# Patient Record
Sex: Female | Born: 1970
Health system: Southern US, Community
[De-identification: ages and names within clinical notes are randomized; demographics above are authoritative.]

## PROBLEM LIST (undated history)

## (undated) ENCOUNTER — Ambulatory Visit: Admission: EM | Source: Home / Self Care

## (undated) DIAGNOSIS — IMO0002 Reserved for concepts with insufficient information to code with codable children: Secondary | ICD-10-CM

## (undated) DIAGNOSIS — N920 Excessive and frequent menstruation with regular cycle: Secondary | ICD-10-CM

## (undated) DIAGNOSIS — D219 Benign neoplasm of connective and other soft tissue, unspecified: Secondary | ICD-10-CM

## (undated) DIAGNOSIS — F419 Anxiety disorder, unspecified: Secondary | ICD-10-CM

## (undated) DIAGNOSIS — N83299 Other ovarian cyst, unspecified side: Secondary | ICD-10-CM

## (undated) DIAGNOSIS — N946 Dysmenorrhea, unspecified: Secondary | ICD-10-CM

## (undated) DIAGNOSIS — M1990 Unspecified osteoarthritis, unspecified site: Secondary | ICD-10-CM

## (undated) DIAGNOSIS — F909 Attention-deficit hyperactivity disorder, unspecified type: Secondary | ICD-10-CM

## (undated) DIAGNOSIS — J45909 Unspecified asthma, uncomplicated: Secondary | ICD-10-CM

## (undated) DIAGNOSIS — R102 Pelvic and perineal pain: Secondary | ICD-10-CM

## (undated) DIAGNOSIS — G35 Multiple sclerosis: Secondary | ICD-10-CM

## (undated) DIAGNOSIS — G709 Myoneural disorder, unspecified: Secondary | ICD-10-CM

## (undated) DIAGNOSIS — T7840XA Allergy, unspecified, initial encounter: Secondary | ICD-10-CM

## (undated) DIAGNOSIS — G8929 Other chronic pain: Secondary | ICD-10-CM

## (undated) DIAGNOSIS — N809 Endometriosis, unspecified: Secondary | ICD-10-CM

## (undated) DIAGNOSIS — K219 Gastro-esophageal reflux disease without esophagitis: Secondary | ICD-10-CM

## (undated) DIAGNOSIS — N816 Rectocele: Secondary | ICD-10-CM

## (undated) DIAGNOSIS — F32A Depression, unspecified: Secondary | ICD-10-CM

## (undated) DIAGNOSIS — F329 Major depressive disorder, single episode, unspecified: Secondary | ICD-10-CM

## (undated) HISTORY — DX: Major depressive disorder, single episode, unspecified: F32.9

## (undated) HISTORY — DX: Benign neoplasm of connective and other soft tissue, unspecified: D21.9

## (undated) HISTORY — DX: Other chronic pain: G89.29

## (undated) HISTORY — DX: Myoneural disorder, unspecified: G70.9

## (undated) HISTORY — DX: Multiple sclerosis: G35

## (undated) HISTORY — DX: Depression, unspecified: F32.A

## (undated) HISTORY — DX: Unspecified asthma, uncomplicated: J45.909

## (undated) HISTORY — DX: Endometriosis, unspecified: N80.9

## (undated) HISTORY — DX: Attention-deficit hyperactivity disorder, unspecified type: F90.9

## (undated) HISTORY — DX: Anxiety disorder, unspecified: F41.9

## (undated) HISTORY — PX: HERNIA REPAIR: SHX51

## (undated) HISTORY — DX: Unspecified osteoarthritis, unspecified site: M19.90

## (undated) HISTORY — PX: COLON SURGERY: SHX602

## (undated) HISTORY — DX: Pelvic and perineal pain: R10.2

## (undated) HISTORY — DX: Reserved for concepts with insufficient information to code with codable children: IMO0002

## (undated) HISTORY — DX: Other ovarian cyst, unspecified side: N83.299

## (undated) HISTORY — DX: Excessive and frequent menstruation with regular cycle: N92.0

## (undated) HISTORY — DX: Rectocele: N81.6

## (undated) HISTORY — DX: Dysmenorrhea, unspecified: N94.6

## (undated) HISTORY — DX: Allergy, unspecified, initial encounter: T78.40XA

## (undated) HISTORY — DX: Gastro-esophageal reflux disease without esophagitis: K21.9

---

## 2003-07-27 HISTORY — PX: BREAST BIOPSY: SHX20

## 2005-07-29 ENCOUNTER — Ambulatory Visit: Payer: Self-pay | Admitting: Orthopaedic Surgery

## 2006-08-31 ENCOUNTER — Ambulatory Visit: Payer: Self-pay | Admitting: General Surgery

## 2008-06-01 ENCOUNTER — Emergency Department: Payer: Self-pay | Admitting: Emergency Medicine

## 2008-06-13 ENCOUNTER — Ambulatory Visit: Payer: Self-pay | Admitting: Neurology

## 2008-06-18 ENCOUNTER — Ambulatory Visit: Payer: Self-pay | Admitting: Neurology

## 2009-05-29 ENCOUNTER — Ambulatory Visit: Payer: Self-pay

## 2010-06-08 ENCOUNTER — Ambulatory Visit: Payer: Self-pay

## 2010-09-04 ENCOUNTER — Emergency Department: Payer: Self-pay | Admitting: Emergency Medicine

## 2010-11-20 ENCOUNTER — Ambulatory Visit: Payer: Self-pay | Admitting: Family Medicine

## 2011-03-08 ENCOUNTER — Ambulatory Visit: Payer: Self-pay | Admitting: Family Medicine

## 2011-12-21 ENCOUNTER — Ambulatory Visit: Payer: Self-pay | Admitting: Family Medicine

## 2011-12-23 ENCOUNTER — Ambulatory Visit: Payer: Self-pay | Admitting: Family Medicine

## 2012-03-31 ENCOUNTER — Ambulatory Visit: Payer: Self-pay | Admitting: Family Medicine

## 2013-03-29 ENCOUNTER — Ambulatory Visit: Payer: Self-pay | Admitting: Family Medicine

## 2014-09-26 ENCOUNTER — Ambulatory Visit: Payer: Self-pay | Admitting: Obstetrics and Gynecology

## 2014-09-30 ENCOUNTER — Ambulatory Visit: Payer: Self-pay | Admitting: Obstetrics and Gynecology

## 2014-09-30 HISTORY — PX: OVARY SURGERY: SHX727

## 2014-11-18 LAB — SURGICAL PATHOLOGY

## 2014-11-24 NOTE — Op Note (Signed)
PATIENT NAME:  Robin Chan, FRISCIA MR#:  [id number] DATE OF BIRTH:  [birth year]  DATE OF PROCEDURE:  09/30/2014  PREOPERATIVE DIAGNOSIS: Worsening chronic pelvic pain (midline and left lower quadrant).   POSTOPERATIVE DIAGNOSES: 1.  Worsening chronic pelvic pain (midline and left lower quadrant).  2.  Endometriosis.  3.  Right ovarian cyst.  4.  Pelvic congestion syndrome.   SURGICAL PROCEDURES:  1.  Laparoscopic excision and fulguration of endometriosis.  2.  Left salpingo-oophorectomy. 3.  Right ovarian cystectomy.   SURGEON: Brayton Mars, MD   FIRST ASSISTANT: None.   ANESTHESIA: General endotracheal.   INDICATIONS: The patient is a 44 year old, married African American female para 2-0-0-2, with long history of severe dysmenorrhea and chronic pelvic pain noted to be in the midline and left lower quadrant and associated with deep thrusting dyspareunia, presents for surgical evaluation.   FINDINGS AT SURGERY: Revealed a gynecoid pelvis. The uterus was of normal size and shape and mobile. The patient is a transvaginal hysterectomy or total laparoscopic hysterectomy candidate if necessary in the future.   On laparoscopy, the patient was noted to have powder burn implants of endometriosis along the left ovarian fossa. These were excised and cauterized. The left tube and ovary appeared grossly normal. Right ovary contained a 2 cm cyst which was excised. There were multiple varicosities and tortuous uterine arteries noted in the lower uterine segment consistent with pelvic congestion syndrome.   DESCRIPTION OF THE PROCEDURE: The patient was brought to the operating room where she was placed in the supine position. General endotracheal anesthesia was induced without difficulty. She was placed in the dorsal lithotomy position using the bumblebee stirrups. A ChloraPrep and Betadine abdominal, perineal, intravaginal prep and drape was performed in standard fashion. A red Robinson catheter was  used to drain 50 mL of urine from the bladder. Hulka tenaculum was placed on the cervix to facilitate uterine manipulation. Subumbilical vertical incision 5 mm in length was made. The Optiview laparoscopic trocar system was used to place a 5 mm port directly into the abdominopelvic cavity without evidence of bowel or vascular injury. A second 5 mm port was placed in the suprapubic region under direct visualization. An 11 mm trocar was placed in the left lower quadrant under direct visualization. The above-noted findings were photo-documented. The left ovarian fossa endometriosis implants were excised using biopsy forceps. This was followed by Kleppinger bipolar forceps cautery. Care was made to avoid the ureter, which was noted to have appropriate peristalsis below the operative field.   Because of her symptom complex being confined to the left lower quadrant, decision was made to remove the left adnexa. The adnexa was grasped with a grasper and the Ace Harmonic scalpel was used to clamp, coagulate, and cut the infundibulopelvic ligament. The mesosalpinx was likewise sequentially clamped, coagulated, and cut to the level of the uterine cornua, which was then crossclamped, cut, and removed through the 11 mm port. The tube and ovary were removed without difficulty. Good hemostasis was noted. Kleppinger bipolar forceps was used to cauterize the pedicle line to further assure hemostasis. The right ovarian cyst was notable and drilling was performed with the Ace Harmonic scalpel. This opened up the cyst which released serosanguineous fluid. The cyst contents were extracted through a stripping technique. This was followed by cautery of margins of the ovarian cyst wall, which were noted to be bleeding slightly. Once hemostasis was attained, the pelvis was copiously irrigated and irrigant fluid was aspirated. Upon completion of the survey  of the pelvis and abdomen, the procedure was then terminated with all  instrumentation being removed from the abdominopelvic cavity. Pneumoperitoneum was released. The incisions were closed with 0 Vicryl on the fascia in the left lower quadrant port site. Skin was closed with subcuticular stitches of 4-0 Vicryl. Dermabond glue was placed over the incisions. The patient was then awakened, extubated and taken to the recovery room in satisfactory condition.   ESTIMATED BLOOD LOSS: 50 mL.   URINE OUTPUT: 50 mL.   IV FLUIDS: 1200 mL.  COUNTS: All instruments, needle, and sponge counts were verified as correct.    ____________________________ Alanda Slim. Alane Hanssen, MD mad:LT D: 09/30/2014 93:90:30 ET T: 09/30/2014 20:51:41 ET JOB#: [id number]  cc: Hassell Done A. Hartley Urton, MD, <Dictator> [facility] MD ELECTRONICALLY SIGNED 10/07/2014 8:10

## 2014-11-24 NOTE — H&P (Signed)
PATIENT NAME:  Robin Chan, BIGGERS MR#:  [id number] DATE OF BIRTH:  [birth year]  DATE OF ADMISSION:  09/30/2014   PREOPERATIVE DIAGNOSIS:  Chronic pelvic pain (midline and left lower quadrant).   HISTORY OF PRESENT ILLNESS:  The patient is a 44 year old married African American female, para 2, 0-0-2 with a long history of severe dysmenorrhea and chronic pelvic pain noted to be in the midline and left lower quadrant along with significant deep thrusting dyspareunia, presents for evaluation of etiology of pelvic pain. The patient's history and physical were suspicious for endometriosis. Because the symptoms have been progressively worsening over the past several months, she desires to proceed with surgical evaluation.   PAST MEDICAL HISTORY:   1.  Multiple sclerosis.  2.  Menorrhagia.  3.  Deep thrusting dyspareunia.  4.  Severe dysmenorrhea.  5.  ADHD.  6.  Anxiety.  7.  GERD.   8.  Rectocele.    PAST SURGICAL HISTORY:  1.  Tooth extraction.  2.  Tonsillectomy.   PAST OBSTETRIC HISTORY:  Para 2 - 0-0-2; SVD x 2, largest infant, 9 pounds, 4 ounces.   FAMILY HISTORY: Colon cancer maternal cousins, ovarian cancer paternal grandmother, breast cancer mother and great aunt.   SOCIAL HISTORY: The patient is a homemaker. She has never smoked. She uses alcohol occasionally. Does not use drugs.   DRUG ALLERGIES: PENICILLIN WHICH CAUSES ANAPHYLAXIS.   CURRENT MEDICATIONS: Ritalin, meloxicam, Zyrtec, Ambien-CR, Flonase, Xyzal, Lexapro, Ventolin HFA, EpiPen, vitamin D, multivitamins, fish oil, glucosamine-chondroitin sulfate, probiotics.   REVIEW OF SYSTEMS: The patient denies recent illness. She does not have reactive airway disease. She has not had antibiotic use recently. No history of coagulopathy.   PHYSICAL EXAMINATION:  VITAL SIGNS: Weight 202 pounds, height 69.6 inches, BMI 29.4, heart rate 106, blood pressure 129/82.  GENERAL: The patient is a pleasant female in no acute distress. She is  alert and oriented. Affect is appropriate.  HEENT: Oropharynx is clear.  NECK: Supple without thyroid or adenopathy.  LUNGS: Clear.  HEART: Regular rate and rhythm without murmur, S3 or S4.  ABDOMEN: Soft and nontender. No organomegaly.  PELVIC: External genitalia normal. BUS normal. Vagina with good estrogen effect.  Cervix with mild cervical motion tenderness 1/4.  Uterus is anteverted, mobile and tender, 4/4.  Right adnexa nonpalpable, nontender. Left adnexa nonpalpable but 2/4 tender.  RECTAL: External rectal exam is normal.  EXTREMITIES: Warm and dry.   IMPRESSION:  1.  Worsening pelvic pain, midline and right lower quadrant.  2.  Severe dysmenorrhea.  3.  Deep thrusting dyspareunia.  4.  Suspect endometriosis.   PLAN: Laparoscopy with peritoneal biopsies, possible excision of left adnexa.    PREOPERATIVE CONSENT: The patient is to undergo laparoscopy with peritoneal biopsies to assess for etiology of pelvic pain. The patient has history and physical suspicious of endometriosis. The patient will have laparoscopy with biopsies only if endometriosis is clearly identified as the patient would then be candidate for complete hysterectomy. At a later date.  I do not anticipate removing adnexal structures today.  If there is significant disease in the left adnexa, we may consider laparoscopic LSO.  The patient is accepting of all risks which include, but are not limited to, bleeding, infection, pelvic organ injury with need for repair, blood clot disorders, anesthesia risks, etc.  All questions have been answered.  Informed consent is given. The patient is ready and willing to proceed with surgery as scheduled.      ____________________________ Alanda Slim Keiden Deskin,  MD mad:DT D: 09/30/2014 10:56:21 ET T: 09/30/2014 11:55:40 ET JOB#: [id number]  cc: Hassell Done A. Aoi Kouns, MD, <Dictator> [facility] MD ELECTRONICALLY SIGNED 09/30/2014 15:54

## 2014-12-31 ENCOUNTER — Ambulatory Visit (INDEPENDENT_AMBULATORY_CARE_PROVIDER_SITE_OTHER): Payer: 59 | Admitting: Family Medicine

## 2014-12-31 ENCOUNTER — Encounter: Payer: Self-pay | Admitting: Family Medicine

## 2014-12-31 ENCOUNTER — Encounter (INDEPENDENT_AMBULATORY_CARE_PROVIDER_SITE_OTHER): Payer: Self-pay

## 2014-12-31 VITALS — BP 122/72 | HR 95 | Temp 97.6°F | Resp 16 | Ht 69.5 in | Wt 198.3 lb

## 2014-12-31 DIAGNOSIS — R9389 Abnormal findings on diagnostic imaging of other specified body structures: Secondary | ICD-10-CM | POA: Insufficient documentation

## 2014-12-31 DIAGNOSIS — J3089 Other allergic rhinitis: Secondary | ICD-10-CM | POA: Diagnosis not present

## 2014-12-31 DIAGNOSIS — E559 Vitamin D deficiency, unspecified: Secondary | ICD-10-CM | POA: Insufficient documentation

## 2014-12-31 DIAGNOSIS — J4520 Mild intermittent asthma, uncomplicated: Secondary | ICD-10-CM | POA: Insufficient documentation

## 2014-12-31 DIAGNOSIS — K5900 Constipation, unspecified: Secondary | ICD-10-CM | POA: Insufficient documentation

## 2014-12-31 DIAGNOSIS — F330 Major depressive disorder, recurrent, mild: Secondary | ICD-10-CM | POA: Insufficient documentation

## 2014-12-31 DIAGNOSIS — G35 Multiple sclerosis: Secondary | ICD-10-CM

## 2014-12-31 DIAGNOSIS — G43109 Migraine with aura, not intractable, without status migrainosus: Secondary | ICD-10-CM | POA: Insufficient documentation

## 2014-12-31 DIAGNOSIS — F329 Major depressive disorder, single episode, unspecified: Secondary | ICD-10-CM

## 2014-12-31 DIAGNOSIS — Z91030 Bee allergy status: Secondary | ICD-10-CM | POA: Insufficient documentation

## 2014-12-31 DIAGNOSIS — G4700 Insomnia, unspecified: Secondary | ICD-10-CM | POA: Diagnosis not present

## 2014-12-31 DIAGNOSIS — F32A Depression, unspecified: Secondary | ICD-10-CM

## 2014-12-31 DIAGNOSIS — Z8781 Personal history of (healed) traumatic fracture: Secondary | ICD-10-CM | POA: Insufficient documentation

## 2014-12-31 DIAGNOSIS — F909 Attention-deficit hyperactivity disorder, unspecified type: Secondary | ICD-10-CM | POA: Diagnosis not present

## 2014-12-31 DIAGNOSIS — F988 Other specified behavioral and emotional disorders with onset usually occurring in childhood and adolescence: Secondary | ICD-10-CM

## 2014-12-31 DIAGNOSIS — D352 Benign neoplasm of pituitary gland: Secondary | ICD-10-CM | POA: Insufficient documentation

## 2014-12-31 DIAGNOSIS — M62830 Muscle spasm of back: Secondary | ICD-10-CM

## 2014-12-31 DIAGNOSIS — G4709 Other insomnia: Secondary | ICD-10-CM | POA: Insufficient documentation

## 2014-12-31 DIAGNOSIS — G35A Relapsing-remitting multiple sclerosis: Secondary | ICD-10-CM

## 2014-12-31 DIAGNOSIS — M6530 Trigger finger, unspecified finger: Secondary | ICD-10-CM | POA: Insufficient documentation

## 2014-12-31 DIAGNOSIS — J302 Other seasonal allergic rhinitis: Secondary | ICD-10-CM | POA: Insufficient documentation

## 2014-12-31 DIAGNOSIS — N809 Endometriosis, unspecified: Secondary | ICD-10-CM | POA: Insufficient documentation

## 2014-12-31 MED ORDER — AMPHETAMINE-DEXTROAMPHET ER 25 MG PO CP24: 25.0000 mg | ORAL_CAPSULE | ORAL | Status: DC

## 2014-12-31 MED ORDER — LEVOCETIRIZINE DIHYDROCHLORIDE 5 MG PO TABS: 5.0000 mg | ORAL_TABLET | Freq: Every evening | ORAL | Status: DC

## 2014-12-31 MED ORDER — CYCLOBENZAPRINE HCL 5 MG PO TABS: 5.0000 mg | ORAL_TABLET | Freq: Three times a day (TID) | ORAL | Status: DC | PRN

## 2014-12-31 MED ORDER — FLUTICASONE PROPIONATE 50 MCG/ACT NA SUSP: 2.0000 | Freq: Every day | NASAL | Status: DC

## 2014-12-31 MED ORDER — ZOLPIDEM TARTRATE ER 12.5 MG PO TBCR: 12.5000 mg | EXTENDED_RELEASE_TABLET | Freq: Every evening | ORAL | Status: DC | PRN

## 2014-12-31 MED ORDER — ESCITALOPRAM OXALATE 20 MG PO TABS: 20.0000 mg | ORAL_TABLET | Freq: Every day | ORAL | Status: DC

## 2014-12-31 NOTE — Progress Notes (Signed)
Name: Robin Chan   MRN: [id number]    DOB: 1970/11/24   Date:12/31/2014       Progress Note  Subjective  Chief Complaint  Chief Complaint  Patient presents with  . Medication Refill    3 month F/U  . Asthma    Improving  . Allergic Rhinitis     Improving  . Insomnia    6-8 hours nightly  . Multiple Sclerosis    unchanged  . Depression    worsen due to stress and son graduating high school about to leave for college  . ADHD    unchanged-lower dose of medication helps.    HPI    MS: patient was diagnosed with MS as a young adult, initially seen by neurologist and tried multiple medications but she could not tolerate the side effects. . She has been off medication for a long time and symptoms have been stable.  She has some leg  leg spasticity and uses a cane at times, also has some symptoms on right arm.    ADHD: she was formally diagnosed by her neurologist, has been on stimulants for many years. Tolerating medication and seems to control symptoms  Depression: she was struggling emotionally because she did not get the support at church, but she is coming around now, still taking medications.   Insomnia:  Taking Ambien, needs refills , tolerating medication well   Patient Active Problem List   Diagnosis Date Noted  . Abnormal CAT scan 12/31/2014  . ADD (attention deficit disorder) 12/31/2014  . Allergic to bees 12/31/2014  . Back muscle spasm 12/31/2014  . Insomnia, persistent 12/31/2014  . CN (constipation) 12/31/2014  . Clinical depression 12/31/2014  . Personal history of traumatic fracture 12/31/2014  . Asthma, mild intermittent 12/31/2014  . Migraine with aura and without status migrainosus, not intractable 12/31/2014  . Multiple sclerosis, relapsing-remitting 12/31/2014  . Adnexal pain 12/31/2014  . Allergic rhinitis 12/31/2014  . Pituitary microadenoma 12/31/2014  . Vitamin D deficiency 12/31/2014  . Acquired trigger finger 12/31/2014    Past Surgical  History  Procedure Laterality Date  . Ovary surgery Left 09/30/2014    Family History  Problem Relation Age of Onset  . Anemia Mother   . Osteoporosis Mother   . Mental illness Father     Bipolor  . Arthritis Brother     History   Social History  . Marital Status: Married    Spouse Name: N/A  . Number of Children: N/A  . Years of Education: N/A   Occupational History  . Not on file.   Social History Main Topics  . Smoking status: Never Smoker   . Smokeless tobacco: Not on file  . Alcohol Use: No  . Drug Use: No  . Sexual Activity: Not Currently   Other Topics Concern  . Not on file   Social History Narrative     Current outpatient prescriptions:  .  albuterol (PROVENTIL HFA;VENTOLIN HFA) 108 (90 BASE) MCG/ACT inhaler, Inhale 1 puff into the lungs every 6 (six) hours as needed for wheezing or shortness of breath., Disp: , Rfl:  .  amphetamine-dextroamphetamine (ADDERALL XR) 25 MG 24 hr capsule, Take 1 capsule by mouth every morning., Disp: 90 capsule, Rfl: 0 .  cetirizine (ZYRTEC) 10 MG tablet, Take 10 mg by mouth daily., Disp: , Rfl:  .  Cholecalciferol (VITAMIN D3) 1000 UNITS CHEW, Chew 1,000 Units by mouth daily., Disp: , Rfl:  .  cyclobenzaprine (FLEXERIL) 5 MG tablet, Take  1 tablet (5 mg total) by mouth 3 (three) times daily as needed for muscle spasms., Disp: 90 tablet, Rfl: 1 .  EPINEPHrine 0.3 mg/0.3 mL IJ SOAJ injection, Inject 0.3 mg into the muscle once., Disp: , Rfl:  .  escitalopram (LEXAPRO) 20 MG tablet, Take 1 tablet (20 mg total) by mouth daily., Disp: 90 tablet, Rfl: 1 .  fluticasone (FLONASE) 50 MCG/ACT nasal spray, Place 2 sprays into both nostrils daily., Disp: 48 g, Rfl: 1 .  ibuprofen (ADVIL,MOTRIN) 800 MG tablet, Take 1 tablet by mouth as needed., Disp: , Rfl:  .  levocetirizine (XYZAL) 5 MG tablet, Take 1 tablet (5 mg total) by mouth every evening., Disp: 90 tablet, Rfl: 4 .  Multiple Vitamins-Minerals (MULTIVITAMIN ADULT PO), Take by mouth.,  Disp: , Rfl:  .  Omega-3 Fatty Acids (FISH OIL) 1000 MG CAPS, Take by mouth., Disp: , Rfl:  .  Polyethylene Glycol 3350 GRAN, Take by mouth as needed., Disp: , Rfl:  .  Probiotic Product (PROBIOTIC DAILY PO), Take by mouth., Disp: , Rfl:  .  zolpidem (AMBIEN CR) 12.5 MG CR tablet, Take 1 tablet (12.5 mg total) by mouth at bedtime as needed for sleep., Disp: 90 tablet, Rfl: 1  Allergies  Allergen Reactions  . Penicillins Anaphylaxis  . Bee Venom     Bee     ROS  Constitutional: Negative for fever or weight change.  Respiratory: Negative for cough and shortness of breath.   Cardiovascular: Negative for chest pain or palpitations.  Gastrointestinal: Negative for abdominal pain, no bowel changes.  Musculoskeletal: Negative for gait problem or joint swelling.  Skin: Negative for rash.  Neurological: Negative for dizziness, she has episodic headaches with biometric pressure changes.  No other specific complaints in a complete review of systems (except as listed in HPI above).  Objective  Filed Vitals:   12/31/14 0936  BP: 122/72  Pulse: 95  Temp: 97.6 F (36.4 C)  TempSrc: Oral  Resp: 16  Height: 5' 9.5" (1.765 m)  Weight: 198 lb 4.8 oz (89.948 kg)  SpO2: 98%    Physical Exam   Constitutional: Patient appears well-developed and well-nourished. No distress.  HENT: Head: Normocephalic and atraumatic. Nose: Nose normal. Mouth/Throat: Oropharynx is clear and moist. No oropharyngeal exudate.  Eyes: Conjunctivae and EOM are normal. Pupils are equal, round, and reactive to light. No scleral icterus.  Neck: Normal range of motion. Neck supple. No JVD present. No thyromegaly present.  Cardiovascular: Normal rate, regular rhythm and normal heart sounds.  No murmur heard. No BLE edema. Pulmonary/Chest: Effort normal and breath sounds normal. No respiratory distress. Abdominal: Soft. Bowel sounds are normal, no distension. There is no tenderness. no masses Musculoskeletal: Normal  range of motion, no joint effusions. No gross deformities Pain during palpation of back Neurological: he is alert and oriented to person, place, and time. No cranial nerve deficit. Coordination, balance, strength, speech and gait are normal.  Skin: Skin is warm and dry. No rash noted. No erythema.  Psychiatric: Patient has a normal mood and affect. behavior is normal. Judgment and thought content normal.     Assessment & Plan  1. Multiple sclerosis, relapsing-remitting Recently noticed some right arm freezes and has some paresthesias intermittently  otherwise stable  2. Insomnia, persistent Stable on medication - zolpidem (AMBIEN CR) 12.5 MG CR tablet; Take 1 tablet (12.5 mg total) by mouth at bedtime as needed for sleep.  Dispense: 90 tablet; Refill: 1  3. ADD (attention deficit disorder) Continue  medication - amphetamine-dextroamphetamine (ADDERALL XR) 25 MG 24 hr capsule; Take 1 capsule by mouth every morning.  Dispense: 90 capsule; Refill: 0  4. Clinical depression She is still very stressed, she states her faith has been challenged lately  - escitalopram (LEXAPRO) 20 MG tablet; Take 1 tablet (20 mg total) by mouth daily.  Dispense: 90 tablet; Refill: 1  5. Other allergic rhinitis stable - fluticasone (FLONASE) 50 MCG/ACT nasal spray; Place 2 sprays into both nostrils daily.  Dispense: 48 g; Refill: 1 - levocetirizine (XYZAL) 5 MG tablet; Take 1 tablet (5 mg total) by mouth every evening.  Dispense: 90 tablet; Refill: 4  6. Muscle spasm of back Continue flexeril  - cyclobenzaprine (FLEXERIL) 5 MG tablet; Take 1 tablet (5 mg total) by mouth 3 (three) times daily as needed for muscle spasms.  Dispense: 90 tablet; Refill: 1

## 2014-12-31 NOTE — Patient Instructions (Signed)

## 2015-01-08 ENCOUNTER — Ambulatory Visit: Payer: Self-pay | Admitting: Obstetrics and Gynecology

## 2015-01-18 ENCOUNTER — Other Ambulatory Visit: Payer: Self-pay | Admitting: Family Medicine

## 2015-01-23 ENCOUNTER — Ambulatory Visit (INDEPENDENT_AMBULATORY_CARE_PROVIDER_SITE_OTHER): Payer: 59 | Admitting: Obstetrics and Gynecology

## 2015-01-23 ENCOUNTER — Encounter: Payer: Self-pay | Admitting: Obstetrics and Gynecology

## 2015-01-23 VITALS — BP 119/70 | HR 102 | Ht 69.0 in | Wt 196.3 lb

## 2015-01-23 DIAGNOSIS — N809 Endometriosis, unspecified: Secondary | ICD-10-CM | POA: Diagnosis not present

## 2015-01-23 DIAGNOSIS — D649 Anemia, unspecified: Secondary | ICD-10-CM

## 2015-01-23 DIAGNOSIS — N816 Rectocele: Secondary | ICD-10-CM

## 2015-01-23 DIAGNOSIS — N920 Excessive and frequent menstruation with regular cycle: Secondary | ICD-10-CM

## 2015-01-23 MED ORDER — FERROUS SULFATE 325 (65 FE) MG PO TABS: 325.0000 mg | ORAL_TABLET | Freq: Two times a day (BID) | ORAL | Status: DC

## 2015-01-23 NOTE — Progress Notes (Signed)
Patient ID: Robin Chan, female   DOB: 03/16/1971, 44 y.o.   MRN: [id number]    GYN ENCOUNTER NOTE  Subjective:       Robin Chan is a 44 y.o. married African American female, para 2, 0-0-2 with a long history of MS, severe dysmenorrhea and endometriosis, rectocele, and PSH of Lap Excision and Fulguration of endometriosis, LSO, right ovarian cystectomy [09/30/2014] here for gynecologic evaluation of the following issues:  1. Menorrhagia 2. Endometriosis management 3. Rectocele  Pt states that her last two menstrual cycles have been pain-free but has had heavy bleeding/clots for first 2 days of each cycle; requiring thick and super absorbable pads d/t regular sized pads and tampons not being effective. Pt states menstrual bleeding in the last 2 months was heavier than she's ever had in the past. She does not want to use any type of hormone therapy for treatment of heavy bleeding/endometriosis. She has noted improvement of MS symptoms and feels that her long history of chronic hormone use caused MS symptoms. Pt interested in hysterectomy for endometriosis.   Pt states constipation from her rectocele is managed with probiotics and Miralax but has difficulty with rectocele during menstruation.   BLF, PA-S  Gynecologic History Patient's last menstrual period was 01/06/2015 (exact date). Contraception: abstinence  Obstetric History OB History  No data available    Past Medical History  Diagnosis Date  . GERD (gastroesophageal reflux disease)   . Endometriosis   . Anxiety   . MS (multiple sclerosis)   . Painful menstruation   . Rectocele   . Heavy period   . Dyspareunia   . Complex ovarian cyst   . Fibroid   . Chronic pelvic pain in female   . ADHD (attention deficit hyperactivity disorder)   . Allergy   . Depression     Past Surgical History  Procedure Laterality Date  . Ovary surgery Left 09/30/2014    Current Outpatient Prescriptions on File Prior to Visit   Medication Sig Dispense Refill  . albuterol (PROVENTIL HFA;VENTOLIN HFA) 108 (90 BASE) MCG/ACT inhaler Inhale 1 puff into the lungs every 6 (six) hours as needed for wheezing or shortness of breath.    . amphetamine-dextroamphetamine (ADDERALL XR) 25 MG 24 hr capsule Take 1 capsule by mouth every morning. 90 capsule 0  . cetirizine (ZYRTEC) 10 MG tablet Take 10 mg by mouth daily.    . Cholecalciferol (VITAMIN D3) 1000 UNITS CHEW Chew 1,000 Units by mouth daily.    . cyclobenzaprine (FLEXERIL) 5 MG tablet Take 1 tablet (5 mg total) by mouth 3 (three) times daily as needed for muscle spasms. 90 tablet 1  . EPINEPHrine 0.3 mg/0.3 mL IJ SOAJ injection Inject 0.3 mg into the muscle once.    . escitalopram (LEXAPRO) 20 MG tablet Take 1 tablet (20 mg total) by mouth daily. 90 tablet 1  . fluticasone (FLONASE) 50 MCG/ACT nasal spray Place 2 sprays into both nostrils daily. 48 g 1  . ibuprofen (ADVIL,MOTRIN) 800 MG tablet Take 1 tablet by mouth as needed.    Marland Kitchen levocetirizine (XYZAL) 5 MG tablet Take 1 tablet (5 mg total) by mouth every evening. 90 tablet 4  . montelukast (SINGULAIR) 10 MG tablet Take 1 tablet (10 mg total) by mouth at bedtime. 90 tablet 1  . Multiple Vitamins-Minerals (MULTIVITAMIN ADULT PO) Take by mouth.    . Omega-3 Fatty Acids (FISH OIL) 1000 MG CAPS Take by mouth.    . Polyethylene Glycol 3350 GRAN Take  by mouth as needed.    . Probiotic Product (PROBIOTIC DAILY PO) Take by mouth.    . zolpidem (AMBIEN CR) 12.5 MG CR tablet Take 1 tablet (12.5 mg total) by mouth at bedtime as needed for sleep. 90 tablet 1   No current facility-administered medications on file prior to visit.    Allergies  Allergen Reactions  . Penicillins Anaphylaxis  . Bee Venom     Bee    History   Social History  . Marital Status: Married    Spouse Name: N/A  . Number of Children: N/A  . Years of Education: N/A   Occupational History  . Not on file.   Social History Main Topics  . Smoking  status: Never Smoker   . Smokeless tobacco: Not on file  . Alcohol Use: No  . Drug Use: No  . Sexual Activity: Not Currently   Other Topics Concern  . Not on file   Social History Narrative    Family History  Problem Relation Age of Onset  . Anemia Mother   . Osteoporosis Mother   . Mental illness Father     Bipolor  . Arthritis Brother     The following portions of the patient's history were reviewed and updated as appropriate: allergies, current medications, past family history, past medical history, past social history, past surgical history and problem list.  Review of Systems Review of Systems - General ROS: negative for - chills, fatigue, fever, hot flashes, malaise or night sweats Hematological and Lymphatic ROS: negative for - bleeding problems or swollen lymph nodes Gastrointestinal ROS: negative for - abdominal pain, blood in stools, change in bowel habits and nausea/vomiting Musculoskeletal ROS: negative for - joint pain, muscle pain or muscular weakness Genito-Urinary ROS: Positive for - heavy menses. Negative for - change in menstrual cycle, dysmenorrhea, dyspareunia, dysuria, genital discharge, genital ulcers, hematuria, incontinence, nocturia or pelvic painjj  Objective:   BP 119/70 mmHg  Pulse 102  Ht 5\' 9"  (1.753 m)  Wt 196 lb 4.8 oz (89.041 kg)  BMI 28.98 kg/m2  LMP 01/06/2015 (Exact Date) CONSTITUTIONAL: Well-developed, well-nourished female in no acute distress.  HENT:  Normocephalic, atraumatic.  NECK: Normal range of motion, supple, no masses.  Normal thyroid.  SKIN: Skin is warm and dry. No rash noted. Not diaphoretic. No erythema. No pallor. [place]: Alert and oriented to person, place, and time.  PSYCHIATRIC: Normal mood and affect. Normal behavior. Normal judgment and thought content. CARDIOVASCULAR:Not Examined RESPIRATORY: Not Examined BREASTS: Not Examined ABDOMEN: Soft, non distended; Non tender.  No Organomegaly. PELVIC:  External  Genitalia: Normal  BUS: Normal  Vagina: Rectocele [moderate], pink mucosa, no bleeding,                                 lesions, or discharge  Cervix: Normal  Uterus: Normal size, shape,consistency, mobile; nontender  Adnexa: Normal  RV: Rectocele [moderate] with splayed levators  Bladder: Nontender MUSCULOSKELETAL: Normal range of motion. No tenderness.  No cyanosis, clubbing, or edema.  Assessment:   1. Menorrhagia with regular cycle  - CBC w/Diff - ferrous sulfate 325 (65 FE) MG tablet; Take 1 tablet (325 mg total) by mouth 2 (two) times daily with a meal.  Dispense: 60 tablet; Refill: 0  2. Anemia, unspecified anemia type  - ferrous sulfate 325 (65 FE) MG tablet; Take 1 tablet (325 mg total) by mouth 2 (two) times daily with a meal.  Dispense: 60 tablet; Refill: 0  3. Rectocele  4. Endometriosis     Plan:  1. Pelvic exam - done.  2. Discuss hysterectomy for management of heavy bleeding/endometriosis.consider TVH versus LAVH/posterior colporrhaphy  3. Discuss colporrhaphy for rectocele repair  3. 63-month F/U  A total of 15 minutes were spent face-to-face with the patient during this encounter and over half of that time dealt with counseling and coordination of care.   I have seen, interviewed, and examined the patient in conjunction with the [facility].A. student and affirm the diagnosis and management plan. Haylen Shelnutt A. Oluwanifemi Susman, MD, Cherlynn June

## 2015-01-24 ENCOUNTER — Telehealth: Payer: Self-pay

## 2015-01-24 LAB — CBC WITH DIFFERENTIAL/PLATELET
Basophils Absolute: 0.1 10*3/uL (ref 0.0–0.2)
Basos: 1 %
EOS (ABSOLUTE): 0.1 10*3/uL (ref 0.0–0.4)
Eos: 1 %
Hematocrit: 35.3 % (ref 34.0–46.6)
Hemoglobin: 11.9 g/dL (ref 11.1–15.9)
Immature Grans (Abs): 0 10*3/uL (ref 0.0–0.1)
Immature Granulocytes: 0 %
Lymphocytes Absolute: 3 10*3/uL (ref 0.7–3.1)
Lymphs: 38 %
MCH: 29.4 pg (ref 26.6–33.0)
MCHC: 33.7 g/dL (ref 31.5–35.7)
MCV: 87 fL (ref 79–97)
Monocytes Absolute: 0.8 10*3/uL (ref 0.1–0.9)
Monocytes: 10 %
Neutrophils Absolute: 4 10*3/uL (ref 1.4–7.0)
Neutrophils: 50 %
Platelets: 431 10*3/uL — ABNORMAL HIGH (ref 150–379)
RBC: 4.05 x10E6/uL (ref 3.77–5.28)
RDW: 13.6 % (ref 12.3–15.4)
WBC: 8 10*3/uL (ref 3.4–10.8)

## 2015-01-24 NOTE — Telephone Encounter (Signed)
Left generic message that labs were normal and if any further questions to contact office. (CBC)

## 2015-02-05 ENCOUNTER — Other Ambulatory Visit: Payer: Self-pay | Admitting: Family Medicine

## 2015-02-05 ENCOUNTER — Ambulatory Visit (INDEPENDENT_AMBULATORY_CARE_PROVIDER_SITE_OTHER): Payer: 59 | Admitting: Family Medicine

## 2015-02-05 ENCOUNTER — Encounter: Payer: Self-pay | Admitting: Family Medicine

## 2015-02-05 VITALS — BP 122/84 | HR 106 | Temp 98.8°F | Resp 18 | Ht 69.0 in | Wt 195.5 lb

## 2015-02-05 DIAGNOSIS — H9312 Tinnitus, left ear: Secondary | ICD-10-CM | POA: Diagnosis not present

## 2015-02-05 DIAGNOSIS — Z0000 Encounter for general adult medical examination without abnormal findings: Secondary | ICD-10-CM | POA: Diagnosis not present

## 2015-02-05 DIAGNOSIS — M722 Plantar fascial fibromatosis: Secondary | ICD-10-CM

## 2015-02-05 DIAGNOSIS — Z1239 Encounter for other screening for malignant neoplasm of breast: Secondary | ICD-10-CM | POA: Insufficient documentation

## 2015-02-05 DIAGNOSIS — Z01419 Encounter for gynecological examination (general) (routine) without abnormal findings: Secondary | ICD-10-CM

## 2015-02-05 DIAGNOSIS — Z719 Counseling, unspecified: Secondary | ICD-10-CM

## 2015-02-05 DIAGNOSIS — Z124 Encounter for screening for malignant neoplasm of cervix: Secondary | ICD-10-CM

## 2015-02-05 DIAGNOSIS — Z7189 Other specified counseling: Secondary | ICD-10-CM

## 2015-02-05 NOTE — Progress Notes (Signed)
Name: Robin Chan   MRN: [id number]    DOB: [birth year]   Date:02/05/2015       Progress Note  Subjective  Chief Complaint  Chief Complaint  Patient presents with  . Annual Exam    HPI  Well Woman : she is here today for annual exam. She has joined a healthy eating and exercise program through [facility] and is feeling well. The only new problem is plantar fascitis since she went on vacation to [place] and walked too much.   Left ear tinnitus: started a few days ago, no hearing loss, left side only, her allergy symptoms have been worse over the past week.   Patient Active Problem List   Diagnosis Date Noted  . Breast cancer screening 02/05/2015  . Abnormal CAT scan 12/31/2014  . ADD (attention deficit disorder) 12/31/2014  . Allergic to bees 12/31/2014  . Back muscle spasm 12/31/2014  . Insomnia, persistent 12/31/2014  . CN (constipation) 12/31/2014  . Clinical depression 12/31/2014  . Personal history of traumatic fracture 12/31/2014  . Asthma, mild intermittent 12/31/2014  . Migraine with aura and without status migrainosus, not intractable 12/31/2014  . Multiple sclerosis, relapsing-remitting 12/31/2014  . Adnexal pain 12/31/2014  . Allergic rhinitis 12/31/2014  . Pituitary microadenoma 12/31/2014  . Vitamin D deficiency 12/31/2014  . Acquired trigger finger 12/31/2014    Past Surgical History  Procedure Laterality Date  . Ovary surgery Left 09/30/2014    Family History  Problem Relation Age of Onset  . Anemia Mother   . Osteoporosis Mother   . Mental illness Father     [place]  . Arthritis Brother     History   Social History  . Marital Status: Married    Spouse Name: N/A  . Number of Children: N/A  . Years of Education: N/A   Occupational History  . Not on file.   Social History Main Topics  . Smoking status: Never Smoker   . Smokeless tobacco: Never Used  . Alcohol Use: No  . Drug Use: No  . Sexual Activity: Not Currently   Other Topics  Concern  . Not on file   Social History Narrative     Current outpatient prescriptions:  .  albuterol (PROVENTIL HFA;VENTOLIN HFA) 108 (90 BASE) MCG/ACT inhaler, Inhale 1 puff into the lungs every 6 (six) hours as needed for wheezing or shortness of breath., Disp: , Rfl:  .  amphetamine-dextroamphetamine (ADDERALL XR) 25 MG 24 hr capsule, Take 1 capsule by mouth every morning., Disp: 90 capsule, Rfl: 0 .  cetirizine (ZYRTEC) 10 MG tablet, Take 10 mg by mouth daily., Disp: , Rfl:  .  Cholecalciferol (VITAMIN D3) 1000 UNITS CHEW, Chew 1,000 Units by mouth daily., Disp: , Rfl:  .  cyclobenzaprine (FLEXERIL) 5 MG tablet, Take 1 tablet (5 mg total) by mouth 3 (three) times daily as needed for muscle spasms., Disp: 90 tablet, Rfl: 1 .  EPINEPHrine 0.3 mg/0.3 mL IJ SOAJ injection, Inject 0.3 mg into the muscle once., Disp: , Rfl:  .  escitalopram (LEXAPRO) 20 MG tablet, Take 1 tablet (20 mg total) by mouth daily., Disp: 90 tablet, Rfl: 1 .  fluticasone (FLONASE) 50 MCG/ACT nasal spray, Place 2 sprays into both nostrils daily., Disp: 48 g, Rfl: 1 .  ibuprofen (ADVIL,MOTRIN) 800 MG tablet, Take 1 tablet by mouth as needed., Disp: , Rfl:  .  levocetirizine (XYZAL) 5 MG tablet, Take 1 tablet (5 mg total) by mouth every evening., Disp: 90 tablet, Rfl:  4 .  montelukast (SINGULAIR) 10 MG tablet, Take 1 tablet (10 mg total) by mouth at bedtime., Disp: 90 tablet, Rfl: 1 .  Multiple Vitamins-Minerals (MULTIVITAMIN ADULT PO), Take by mouth., Disp: , Rfl:  .  Omega-3 Fatty Acids (FISH OIL) 1000 MG CAPS, Take by mouth., Disp: , Rfl:  .  Polyethylene Glycol 3350 GRAN, Take by mouth as needed., Disp: , Rfl:  .  Probiotic Product (PROBIOTIC DAILY PO), Take by mouth., Disp: , Rfl:  .  zolpidem (AMBIEN CR) 12.5 MG CR tablet, Take 1 tablet (12.5 mg total) by mouth at bedtime as needed for sleep., Disp: 90 tablet, Rfl: 1  Allergies  Allergen Reactions  . Penicillins Anaphylaxis  . Bee Venom     Bee      ROS  Constitutional: Negative for fever but is losing some weight   Respiratory: Negative for cough and shortness of breath.   HEENT: some nasal congestion and rhinorrhea. Cardiovascular: Negative for chest pain or palpitations.  Gastrointestinal: Negative for abdominal pain, no bowel changes.  Musculoskeletal: Negative for gait problem or joint swelling.  Skin: Negative for rash.  Neurological: Negative for dizziness or headache.  No other specific complaints in a complete review of systems (except as listed in HPI above).  Objective  Filed Vitals:   02/05/15 1143  BP: 122/84  Pulse: 106  Temp: 98.8 F (37.1 C)  TempSrc: Oral  Resp: 18  Height: 5\' 9"  (1.753 m)  Weight: 195 lb 8 oz (88.678 kg)  SpO2: 97%    Body mass index is 28.86 kg/(m^2).  Physical Exam  Constitutional: Patient appears well-developed and well-nourished. No distress.  HENT: Head: Normocephalic and atraumatic. Ears: B TMs ok, no erythema or effusion; Nose: Nose normal. Mouth/Throat: Oropharynx is clear and moist. No oropharyngeal exudate.  Eyes: Conjunctivae and EOM are normal. Pupils are equal, round, and reactive to light. No scleral icterus.  Neck: Normal range of motion. Neck supple. No JVD present. No thyromegaly present.  Cardiovascular: Normal rate, regular rhythm and normal heart sounds.  No murmur heard. No BLE edema. Pulmonary/Chest: Effort normal and breath sounds normal. No respiratory distress. Abdominal: Soft. Bowel sounds are normal, no distension. There is no tenderness. no masses Breast: no lumps or masses, no nipple discharge or rashes FEMALE GENITALIA:  External genitalia normal External urethra normal Vaginal vault normal without discharge or lesions Cervix normal without discharge or lesions Bimanual exam normal without masses RECTAL: not done Musculoskeletal: Normal range of motion, no joint effusions. No gross deformities. Pain during palpation on both heels Neurological:  he is alert and oriented to person, place, and time. No cranial nerve deficit. Coordination, balance, strength, mild dysarthria ,gait are normal.  Skin: Skin is warm and dry. No rash noted. No erythema.  Psychiatric: Patient has a normal mood and affect. behavior is normal. Judgment and thought content normal.  Recent Results (from the past 2160 hour(s))  CBC w/Diff     Status: Abnormal   Collection Time: 01/23/15  4:26 PM  Result Value Ref Range   WBC 8.0 3.4 - 10.8 x10E3/uL   RBC 4.05 3.77 - 5.28 x10E6/uL   Hemoglobin 11.9 11.1 - 15.9 g/dL   Hematocrit 35.3 34.0 - 46.6 %   MCV 87 79 - 97 fL   MCH 29.4 26.6 - 33.0 pg   MCHC 33.7 31.5 - 35.7 g/dL   RDW 13.6 12.3 - 15.4 %   Platelets 431 (H) 150 - 379 x10E3/uL   Neutrophils 50 %  Lymphs 38 %   Monocytes 10 %   Eos 1 %   Basos 1 %   Neutrophils Absolute 4.0 1.4 - 7.0 x10E3/uL   Lymphocytes Absolute 3.0 0.7 - 3.1 x10E3/uL   Monocytes Absolute 0.8 0.1 - 0.9 x10E3/uL   EOS (ABSOLUTE) 0.1 0.0 - 0.4 x10E3/uL   Basophils Absolute 0.1 0.0 - 0.2 x10E3/uL   Immature Granulocytes 0 %   Immature Grans (Abs) 0.0 0.0 - 0.1 x10E3/uL    Diabetic Foot Exam: Diabetic Foot Exam - Simple   No data filed       PHQ2/9: Depression screen [facility] 2/9 12/31/2014  Decreased Interest 1  PHQ - 2 Score 1  Altered sleeping 3  Tired, decreased energy 2  Change in appetite 0  Feeling bad or failure about yourself  0  Trouble concentrating 0  Moving slowly or fidgety/restless 0  Suicidal thoughts 0  PHQ-9 Score 6  Difficult doing work/chores Somewhat difficult   Taking medication , some stress with her church  Fall Risk: Fall Risk  12/31/2014  Falls in the past year? No      Assessment & Plan  1. Well woman exam  - Lipid Profile - Comprehensive Metabolic Panel (CMET) - HgB A1c - Cytology - PAP - Vitamin D (25 hydroxy) - B12 - CBC with Differential - Nicotine/cotinine metabolites  2. Cervical cancer screening  - Cytology - PAP  3.  Breast cancer screening  - MM Digital Screening; Future  4. Health counseling Discussed importance of 150 minutes of physical activity weekly, eat two servings of fish weekly, eat one serving of tree nuts ( cashews, pistachios, pecans, almonds.Marland Kitchen) every other day, eat 6 servings of fruit/vegetables daily and drink plenty of water and avoid sweet beverages.   5. Tinnitus of left ear She wants to call back or return if symptoms don't improve for referral to ENT,  she is going to continue nasal spray use  6. Plantar fasciitis, bilateral Conservative measures for now, ice, massage, good shoe support

## 2015-02-05 NOTE — Patient Instructions (Signed)
Plantar Fasciitis  Plantar fasciitis is a common condition that causes foot pain. It is soreness (inflammation) of the band of tough fibrous tissue on the bottom of the foot that runs from the heel bone (calcaneus) to the ball of the foot. The cause of this soreness may be from excessive standing, poor fitting shoes, running on hard surfaces, being overweight, having an abnormal walk, or overuse (this is common in runners) of the painful foot or feet. It is also common in aerobic exercise dancers and ballet dancers.  SYMPTOMS   Most people with plantar fasciitis complain of:   Severe pain in the morning on the bottom of their foot especially when taking the first steps out of bed. This pain recedes after a few minutes of walking.   Severe pain is experienced also during walking following a long period of inactivity.   Pain is worse when walking barefoot or up stairs  DIAGNOSIS    Your caregiver will diagnose this condition by examining and feeling your foot.   Special tests such as X-rays of your foot, are usually not needed.  PREVENTION    Consult a sports medicine professional before beginning a new exercise program.   Walking programs offer a good workout. With walking there is a lower chance of overuse injuries common to runners. There is less impact and less jarring of the joints.   Begin all new exercise programs slowly. If problems or pain develop, decrease the amount of time or distance until you are at a comfortable level.   Wear good shoes and replace them regularly.   Stretch your foot and the heel cords at the back of the ankle (Achilles tendon) both before and after exercise.   Run or exercise on even surfaces that are not hard. For example, asphalt is better than pavement.   Do not run barefoot on hard surfaces.   If using a treadmill, vary the incline.   Do not continue to workout if you have foot or joint problems. Seek professional help if they do not improve.  HOME CARE INSTRUCTIONS     Avoid activities that cause you pain until you recover.   Use ice or cold packs on the problem or painful areas after working out.   Only take over-the-counter or prescription medicines for pain, discomfort, or fever as directed by your caregiver.   Soft shoe inserts or athletic shoes with air or gel sole cushions may be helpful.   If problems continue or become more severe, consult a sports medicine caregiver or your own health care provider. Cortisone is a potent anti-inflammatory medication that may be injected into the painful area. You can discuss this treatment with your caregiver.  MAKE SURE YOU:    Understand these instructions.   Will watch your condition.   Will get help right away if you are not doing well or get worse.  Document Released: 04/06/2001 Document Revised: 10/04/2011 Document Reviewed: 06/05/2008  [employer] Patient Information 2015 [employer]. This information is not intended to replace advice given to you by your health care provider. Make sure you discuss any questions you have with your health care provider.

## 2015-02-06 LAB — CBC WITH DIFFERENTIAL/PLATELET
Basophils Absolute: 0 10*3/uL (ref 0.0–0.2)
Basos: 1 %
EOS (ABSOLUTE): 0.1 10*3/uL (ref 0.0–0.4)
Eos: 2 %
Hematocrit: 37.4 % (ref 34.0–46.6)
Hemoglobin: 12.1 g/dL (ref 11.1–15.9)
Immature Grans (Abs): 0 10*3/uL (ref 0.0–0.1)
Immature Granulocytes: 0 %
Lymphocytes Absolute: 2.1 10*3/uL (ref 0.7–3.1)
Lymphs: 36 %
MCH: 29.4 pg (ref 26.6–33.0)
MCHC: 32.4 g/dL (ref 31.5–35.7)
MCV: 91 fL (ref 79–97)
Monocytes Absolute: 0.4 10*3/uL (ref 0.1–0.9)
Monocytes: 7 %
Neutrophils Absolute: 3.2 10*3/uL (ref 1.4–7.0)
Neutrophils: 54 %
Platelets: 396 10*3/uL — ABNORMAL HIGH (ref 150–379)
RBC: 4.11 x10E6/uL (ref 3.77–5.28)
RDW: 13.3 % (ref 12.3–15.4)
WBC: 5.9 10*3/uL (ref 3.4–10.8)

## 2015-02-06 LAB — COMPREHENSIVE METABOLIC PANEL
ALT: 14 IU/L (ref 0–32)
AST: 16 IU/L (ref 0–40)
Albumin/Globulin Ratio: 1.8 (ref 1.1–2.5)
Albumin: 4.6 g/dL (ref 3.5–5.5)
Alkaline Phosphatase: 69 IU/L (ref 39–117)
BUN/Creatinine Ratio: 16 (ref 9–23)
BUN: 16 mg/dL (ref 6–24)
Bilirubin Total: 0.6 mg/dL (ref 0.0–1.2)
CO2: 26 mmol/L (ref 18–29)
Calcium: 9.3 mg/dL (ref 8.7–10.2)
Chloride: 98 mmol/L (ref 97–108)
Creatinine, Ser: 1.01 mg/dL — ABNORMAL HIGH (ref 0.57–1.00)
GFR calc Af Amer: 79 mL/min/{1.73_m2} (ref >59)
GFR calc non Af Amer: 68 mL/min/{1.73_m2} (ref >59)
Globulin, Total: 2.5 g/dL (ref 1.5–4.5)
Glucose: 106 mg/dL — ABNORMAL HIGH (ref 65–99)
Potassium: 4.7 mmol/L (ref 3.5–5.2)
Sodium: 139 mmol/L (ref 134–144)
Total Protein: 7.1 g/dL (ref 6.0–8.5)

## 2015-02-06 LAB — LIPID PANEL
Chol/HDL Ratio: 2.4 ratio units (ref 0.0–4.4)
Cholesterol, Total: 164 mg/dL (ref 100–199)
HDL: 67 mg/dL (ref >39)
LDL Calculated: 85 mg/dL (ref 0–99)
Triglycerides: 61 mg/dL (ref 0–149)
VLDL Cholesterol Cal: 12 mg/dL (ref 5–40)

## 2015-02-06 LAB — VITAMIN D 25 HYDROXY (VIT D DEFICIENCY, FRACTURES): Vit D, 25-Hydroxy: 41.4 ng/mL (ref 30.0–100.0)

## 2015-02-06 LAB — HEMOGLOBIN A1C
Est. average glucose Bld gHb Est-mCnc: 123 mg/dL
Hgb A1c MFr Bld: 5.9 % — ABNORMAL HIGH (ref 4.8–5.6)

## 2015-02-06 LAB — VITAMIN B12: Vitamin B-12: 662 pg/mL (ref 211–946)

## 2015-02-07 LAB — NICOTINE/COTININE METABOLITES
Cotinine: NOT DETECTED ng/mL
Nicotine: NOT DETECTED ng/mL

## 2015-02-10 NOTE — Progress Notes (Signed)
Patient notified

## 2015-02-12 LAB — PAP LB, HPV-H+LR
HPV DNA High Risk: NEGATIVE
HPV DNA Low Risk: NEGATIVE
PAP Smear Comment: 0

## 2015-02-13 ENCOUNTER — Telehealth: Payer: Self-pay

## 2015-02-13 NOTE — Telephone Encounter (Signed)
-----   Message from Steele Sizer, MD sent at 02/12/2015 10:01 PM EDT ----- Pap smear within normal limits Neg HPV

## 2015-02-13 NOTE — Telephone Encounter (Signed)
Left message for patient to return my call for her pap results.

## 2015-02-13 NOTE — Progress Notes (Signed)
Patient notified

## 2015-03-13 ENCOUNTER — Telehealth: Payer: Self-pay | Admitting: Family Medicine

## 2015-03-13 NOTE — Telephone Encounter (Signed)
Information documented and faxed to [facility]

## 2015-03-13 NOTE — Telephone Encounter (Signed)
Patient was returning your call to give you her waist circumference: 38in

## 2015-03-28 ENCOUNTER — Ambulatory Visit (INDEPENDENT_AMBULATORY_CARE_PROVIDER_SITE_OTHER): Payer: 59 | Admitting: Family Medicine

## 2015-03-28 ENCOUNTER — Encounter: Payer: Self-pay | Admitting: Family Medicine

## 2015-03-28 VITALS — BP 110/70 | HR 110 | Temp 98.3°F | Resp 16 | Ht 69.0 in | Wt 192.3 lb

## 2015-03-28 DIAGNOSIS — J4520 Mild intermittent asthma, uncomplicated: Secondary | ICD-10-CM | POA: Diagnosis not present

## 2015-03-28 DIAGNOSIS — M5416 Radiculopathy, lumbar region: Secondary | ICD-10-CM | POA: Insufficient documentation

## 2015-03-28 DIAGNOSIS — G4700 Insomnia, unspecified: Secondary | ICD-10-CM

## 2015-03-28 DIAGNOSIS — J309 Allergic rhinitis, unspecified: Secondary | ICD-10-CM | POA: Diagnosis not present

## 2015-03-28 DIAGNOSIS — G35 Multiple sclerosis: Secondary | ICD-10-CM | POA: Diagnosis not present

## 2015-03-28 DIAGNOSIS — F909 Attention-deficit hyperactivity disorder, unspecified type: Secondary | ICD-10-CM | POA: Diagnosis not present

## 2015-03-28 DIAGNOSIS — F988 Other specified behavioral and emotional disorders with onset usually occurring in childhood and adolescence: Secondary | ICD-10-CM

## 2015-03-28 DIAGNOSIS — Z23 Encounter for immunization: Secondary | ICD-10-CM

## 2015-03-28 DIAGNOSIS — J3089 Other allergic rhinitis: Secondary | ICD-10-CM

## 2015-03-28 MED ORDER — HYDROCODONE-ACETAMINOPHEN 10-325 MG PO TABS: 1.0000 | ORAL_TABLET | Freq: Three times a day (TID) | ORAL | Status: DC | PRN

## 2015-03-28 MED ORDER — AMPHETAMINE-DEXTROAMPHET ER 25 MG PO CP24: 25.0000 mg | ORAL_CAPSULE | ORAL | Status: DC

## 2015-03-28 MED ORDER — PREDNISONE 10 MG (48) PO TBPK: ORAL_TABLET | Freq: Every day | ORAL | Status: DC

## 2015-03-28 MED ORDER — AMPHETAMINE-DEXTROAMPHET ER 25 MG PO CP24
25.0000 mg | ORAL_CAPSULE | ORAL | Status: DC
Start: 2015-03-28 — End: 2015-04-14

## 2015-03-28 NOTE — Addendum Note (Signed)
Addended by: Steele Sizer F on: 03/28/2015 11:32 AM   Modules accepted: Orders

## 2015-03-28 NOTE — Addendum Note (Signed)
Addended by: Vonna Kotyk L on: 03/28/2015 11:40 AM   Modules accepted: Orders

## 2015-03-28 NOTE — Progress Notes (Addendum)
Name: Robin Chan   MRN: [id number]    DOB: 1971/07/13   Date:03/28/2015       Progress Note  Subjective  Chief Complaint  Chief Complaint  Patient presents with  . Back Pain    that radiates down left leg for 5 days    HPI  Asthma: taking albuterol prn, doing well at this time   03/28/15 1112  Asthma History  Symptoms 0-2 days/week  Nighttime Awakenings 0-2/month  Asthma interference with normal activity No limitations  SABA use (not for EIB) 0-2 days/wk  Risk: Exacerbations requiring oral systemic steroids 0-1 / year  Asthma Severity Intermittent   Left lumbar Radiculitis: she developed left leg numbness and top of her left foot, 5 days ago and 2 days ago the pain started to radiate to her left lower back, described as a tight feeling with pins and needles, sometimes achy. No rashes, no bowel or bladder incontinence. She feels like her left leg is weaker than right. She has a chronic left foot drop from her MS.   AR; she has been taking all her medication, but has been unable to breath through her left nostril over the past few weeks. Seen by ENT is the past and would like to go back   ADD: she has been doing well on current dose of medication, but did not take pills for two days and slept the entire time. She was stressed about her husband that had neck surgery and complications, had to take him to [facility] and after he got better she slept for two days, she is back to baseline now.   Insomnia: still taking Ambien and is doing well, unable to sleep without it.   Patient Active Problem List   Diagnosis Date Noted  . Left lumbar radiculitis 03/28/2015  . Breast cancer screening 02/05/2015  . Abnormal CAT scan 12/31/2014  . ADD (attention deficit disorder) 12/31/2014  . Allergic to bees 12/31/2014  . Back muscle spasm 12/31/2014  . Insomnia, persistent 12/31/2014  . CN (constipation) 12/31/2014  . Clinical depression 12/31/2014  . Personal history of traumatic fracture  12/31/2014  . Asthma, mild intermittent 12/31/2014  . Migraine with aura and without status migrainosus, not intractable 12/31/2014  . Multiple sclerosis, relapsing-remitting 12/31/2014  . Adnexal pain 12/31/2014  . Perennial allergic rhinitis 12/31/2014  . Pituitary microadenoma 12/31/2014  . Vitamin D deficiency 12/31/2014  . Acquired trigger finger 12/31/2014    Past Surgical History  Procedure Laterality Date  . Ovary surgery Left 09/30/2014    Family History  Problem Relation Age of Onset  . Anemia Mother   . Osteoporosis Mother   . Mental illness Father     [place]  . Arthritis Brother     Social History   Social History  . Marital Status: Married    Spouse Name: N/A  . Number of Children: N/A  . Years of Education: N/A   Occupational History  . Not on file.   Social History Main Topics  . Smoking status: Never Smoker   . Smokeless tobacco: Never Used  . Alcohol Use: No  . Drug Use: No  . Sexual Activity: Not Currently   Other Topics Concern  . Not on file   Social History Narrative     Current outpatient prescriptions:  .  albuterol (PROVENTIL HFA;VENTOLIN HFA) 108 (90 BASE) MCG/ACT inhaler, Inhale 1 puff into the lungs every 6 (six) hours as needed for wheezing or shortness of breath., Disp: , Rfl:  .  amphetamine-dextroamphetamine (ADDERALL XR) 25 MG 24 hr capsule, Take 1 capsule by mouth every morning., Disp: 90 capsule, Rfl: 0 .  cetirizine (ZYRTEC) 10 MG tablet, Take 10 mg by mouth daily., Disp: , Rfl:  .  Cholecalciferol (VITAMIN D3) 1000 UNITS CHEW, Chew 1,000 Units by mouth daily., Disp: , Rfl:  .  cyclobenzaprine (FLEXERIL) 5 MG tablet, Take 1 tablet (5 mg total) by mouth 3 (three) times daily as needed for muscle spasms., Disp: 90 tablet, Rfl: 1 .  EPINEPHrine 0.3 mg/0.3 mL IJ SOAJ injection, Inject 0.3 mg into the muscle once., Disp: , Rfl:  .  escitalopram (LEXAPRO) 20 MG tablet, Take 1 tablet (20 mg total) by mouth daily., Disp: 90 tablet,  Rfl: 1 .  fluticasone (FLONASE) 50 MCG/ACT nasal spray, Place 2 sprays into both nostrils daily., Disp: 48 g, Rfl: 1 .  HYDROcodone-acetaminophen (NORCO) 10-325 MG per tablet, Take 1 tablet by mouth every 8 (eight) hours as needed., Disp: 20 tablet, Rfl: 0 .  ibuprofen (ADVIL,MOTRIN) 800 MG tablet, Take 1 tablet by mouth as needed., Disp: , Rfl:  .  levocetirizine (XYZAL) 5 MG tablet, Take 1 tablet (5 mg total) by mouth every evening., Disp: 90 tablet, Rfl: 4 .  montelukast (SINGULAIR) 10 MG tablet, Take 1 tablet (10 mg total) by mouth at bedtime., Disp: 90 tablet, Rfl: 1 .  Multiple Vitamins-Minerals (MULTIVITAMIN ADULT PO), Take by mouth., Disp: , Rfl:  .  Omega-3 Fatty Acids (FISH OIL) 1000 MG CAPS, Take by mouth., Disp: , Rfl:  .  Polyethylene Glycol 3350 GRAN, Take by mouth as needed., Disp: , Rfl:  .  predniSONE (STERAPRED UNI-PAK 48 TAB) 10 MG (48) TBPK tablet, Take by mouth daily., Disp: 48 tablet, Rfl: 0 .  Probiotic Product (PROBIOTIC DAILY PO), Take by mouth., Disp: , Rfl:  .  zolpidem (AMBIEN CR) 12.5 MG CR tablet, Take 1 tablet (12.5 mg total) by mouth at bedtime as needed for sleep., Disp: 90 tablet, Rfl: 1  Allergies  Allergen Reactions  . Penicillins Anaphylaxis  . Bee Venom     Bee     ROS  Constitutional: Negative for fever or weight change.  Respiratory: Negative for cough and shortness of breath.   Cardiovascular: Negative for chest pain or palpitations.  Gastrointestinal: Negative for abdominal pain, no bowel changes.  Musculoskeletal: Positive  for gait problem but no joint swelling.  Skin: Negative for rash.  Neurological: Negative for dizziness or headache.  No other specific complaints in a complete review of systems (except as listed in HPI above).  Objective  Filed Vitals:   03/28/15 1039  BP: 110/70  Pulse: 110  Temp: 98.3 F (36.8 C)  TempSrc: Oral  Resp: 16  Height: 5\' 9"  (1.753 m)  Weight: 192 lb 4.8 oz (87.227 kg)  SpO2: 97%    Body mass  index is 28.38 kg/(m^2).  Physical Exam  Constitutional: Patient appears well-developed and well-nourished. No distress.  HEENT: head atraumatic, normocephalic, pupils equal and reactive to light, ears TM, neck supple, throat within normal limits Cardiovascular: Normal rate, regular rhythm and normal heart sounds.  No murmur heard. No BLE edema. Pulmonary/Chest: Effort normal and breath sounds normal. No respiratory distress. Abdominal: Soft.  There is no tenderness. Psychiatric: Patient has a normal mood and affect. behavior is normal. Judgment and thought content normal. Muscular Skeletal: tender to palpation of left lower back, patella reflex symmetrical 3 plus bilaterally, she has hyperalgesia on left leg, positive straight leg raise  Recent  Results (from the past 2160 hour(s))  CBC w/Diff     Status: Abnormal   Collection Time: 01/23/15  4:26 PM  Result Value Ref Range   WBC 8.0 3.4 - 10.8 x10E3/uL   RBC 4.05 3.77 - 5.28 x10E6/uL   Hemoglobin 11.9 11.1 - 15.9 g/dL   Hematocrit 35.3 34.0 - 46.6 %   MCV 87 79 - 97 fL   MCH 29.4 26.6 - 33.0 pg   MCHC 33.7 31.5 - 35.7 g/dL   RDW 13.6 12.3 - 15.4 %   Platelets 431 (H) 150 - 379 x10E3/uL   Neutrophils 50 %   Lymphs 38 %   Monocytes 10 %   Eos 1 %   Basos 1 %   Neutrophils Absolute 4.0 1.4 - 7.0 x10E3/uL   Lymphocytes Absolute 3.0 0.7 - 3.1 x10E3/uL   Monocytes Absolute 0.8 0.1 - 0.9 x10E3/uL   EOS (ABSOLUTE) 0.1 0.0 - 0.4 x10E3/uL   Basophils Absolute 0.1 0.0 - 0.2 x10E3/uL   Immature Granulocytes 0 %   Immature Grans (Abs) 0.0 0.0 - 0.1 x10E3/uL  Pap Lb, HPV-h+lr     Status: None   Collection Time: 02/05/15 12:00 AM  Result Value Ref Range   DIAGNOSIS: Comment     Comment: NEGATIVE FOR INTRAEPITHELIAL LESION AND MALIGNANCY.   Specimen adequacy: Comment     Comment: Satisfactory for evaluation. Endocervical and/or squamous metaplastic cells (endocervical component) are present.    Performed by: Comment     Comment:  Shayla Clemons, Cytotechnologist (ASCP)   PAP SMEAR COMMENT .    Note: Comment     Comment: The Pap smear is a screening test designed to aid in the detection of premalignant and malignant conditions of the uterine cervix.  It is not a diagnostic procedure and should not be used as the sole means of detecting cervical cancer.  Both false-positive and false-negative reports do occur.    HPV DNA High Risk Negative Negative   HPV DNA Low Risk Negative Negative    Comment: These HPV tests detect thirteen high-risk types (16/18/31/33/35/ 39/45/51/52/56/58/59/68) and five low-risk types (01/03/41/43/44) without differentiation.   CBC with Differential/Platelet     Status: Abnormal   Collection Time: 02/05/15 12:44 PM  Result Value Ref Range   WBC 5.9 3.4 - 10.8 x10E3/uL   RBC 4.11 3.77 - 5.28 x10E6/uL   Hemoglobin 12.1 11.1 - 15.9 g/dL   Hematocrit 37.4 34.0 - 46.6 %   MCV 91 79 - 97 fL   MCH 29.4 26.6 - 33.0 pg   MCHC 32.4 31.5 - 35.7 g/dL   RDW 13.3 12.3 - 15.4 %   Platelets 396 (H) 150 - 379 x10E3/uL   Neutrophils 54 %   Lymphs 36 %   Monocytes 7 %   Eos 2 %   Basos 1 %   Neutrophils Absolute 3.2 1.4 - 7.0 x10E3/uL   Lymphocytes Absolute 2.1 0.7 - 3.1 x10E3/uL   Monocytes Absolute 0.4 0.1 - 0.9 x10E3/uL   EOS (ABSOLUTE) 0.1 0.0 - 0.4 x10E3/uL   Basophils Absolute 0.0 0.0 - 0.2 x10E3/uL   Immature Granulocytes 0 %   Immature Grans (Abs) 0.0 0.0 - 0.1 x10E3/uL  Comprehensive metabolic panel     Status: Abnormal   Collection Time: 02/05/15 12:44 PM  Result Value Ref Range   Glucose 106 (H) 65 - 99 mg/dL   BUN 16 6 - 24 mg/dL   Creatinine, Ser 1.01 (H) 0.57 - 1.00 mg/dL   GFR calc  non Af Amer 68 >59 mL/min/1.73   GFR calc Af Amer 79 >59 mL/min/1.73   BUN/Creatinine Ratio 16 9 - 23   Sodium 139 134 - 144 mmol/L   Potassium 4.7 3.5 - 5.2 mmol/L   Chloride 98 97 - 108 mmol/L   CO2 26 18 - 29 mmol/L   Calcium 9.3 8.7 - 10.2 mg/dL   Total Protein 7.1 6.0 - 8.5 g/dL   Albumin  4.6 3.5 - 5.5 g/dL   Globulin, Total 2.5 1.5 - 4.5 g/dL   Albumin/Globulin Ratio 1.8 1.1 - 2.5   Bilirubin Total 0.6 0.0 - 1.2 mg/dL   Alkaline Phosphatase 69 39 - 117 IU/L   AST 16 0 - 40 IU/L   ALT 14 0 - 32 IU/L  Lipid panel     Status: None   Collection Time: 02/05/15 12:44 PM  Result Value Ref Range   Cholesterol, Total 164 100 - 199 mg/dL   Triglycerides 61 0 - 149 mg/dL   HDL 67 >39 mg/dL    Comment: According to ATP-III Guidelines, HDL-C >59 mg/dL is considered a negative risk factor for CHD.    VLDL Cholesterol Cal 12 5 - 40 mg/dL   LDL Calculated 85 0 - 99 mg/dL   Chol/HDL Ratio 2.4 0.0 - 4.4 ratio units    Comment:                                   T. Chol/HDL Ratio                                             Men  Women                               1/2 Avg.Risk  3.4    3.3                                   Avg.Risk  5.0    4.4                                2X Avg.Risk  9.6    7.1                                3X Avg.Risk 23.4   11.0   Hemoglobin A1c     Status: Abnormal   Collection Time: 02/05/15 12:44 PM  Result Value Ref Range   Hgb A1c MFr Bld 5.9 (H) 4.8 - 5.6 %    Comment:          Pre-diabetes: 5.7 - 6.4          Diabetes: >6.4          Glycemic control for adults with diabetes: <7.0    Est. average glucose Bld gHb Est-mCnc 123 mg/dL  Vit D  25 hydroxy (rtn osteoporosis monitoring)     Status: None   Collection Time: 02/05/15 12:44 PM  Result Value Ref Range   Vit D, 25-Hydroxy 41.4 30.0 - 100.0 ng/mL    Comment: Vitamin D  deficiency has been defined by the [place] practice guideline as a level of serum 25-OH vitamin D less than 20 ng/mL (1,2). The Endocrine Society went on to further define vitamin D insufficiency as a level between 21 and 29 ng/mL (2). 1. IOM (Institute of Medicine). 2010. Dietary reference    intakes for calcium and D. Felt: The    [employer]. 2. Holick MF, Binkley [place],  Bischoff-Ferrari HA, et al.    Evaluation, treatment, and prevention of vitamin D    deficiency: an Endocrine Society clinical practice    guideline. JCEM. 2011 Jul; 96(7):1911-30.   Vitamin B12     Status: None   Collection Time: 02/05/15 12:44 PM  Result Value Ref Range   Vitamin B-12 662 211 - 946 pg/mL  Nicotine/cotinine metabolites     Status: None   Collection Time: 02/05/15 12:44 PM  Result Value Ref Range   Nicotine None Detected ng/mL    Comment: Nicotine levels greater than 2.0 are consistent with the use of tobacco or tobacco cessation products.    Cotinine None Detected ng/mL    Comment: Cotinine levels greater than 20.0 are consistent with the use of tobacco or tobacco cessation products.       PHQ2/9: Depression screen [facility] 2/9 03/28/2015 12/31/2014  Decreased Interest 0 1  Down, Depressed, Hopeless 0 -  PHQ - 2 Score 0 1  Altered sleeping - 3  Tired, decreased energy - 2  Change in appetite - 0  Feeling bad or failure about yourself  - 0  Trouble concentrating - 0  Moving slowly or fidgety/restless - 0  Suicidal thoughts - 0  PHQ-9 Score - 6  Difficult doing work/chores - Somewhat difficult     Fall Risk: Fall Risk  03/28/2015 12/31/2014  Falls in the past year? No No      Assessment & Plan  1. Left lumbar radiculitis We will hold off on MRI, return if no resolution  - predniSONE (STERAPRED UNI-PAK 48 TAB) 10 MG (48) TBPK tablet; Take by mouth daily.  Dispense: 48 tablet; Refill: 0 - HYDROcodone-acetaminophen (NORCO) 10-325 MG per tablet; Take 1 tablet by mouth every 8 (eight) hours as needed.  Dispense: 20 tablet; Refill: 0  2. Asthma, mild intermittent, uncomplicated Doing well, continue prn medication   3. Perennial allergic rhinitis  - Ambulatory referral to ENT  4. Multiple sclerosis, relapsing-remitting stable  5. ADD (attention deficit disorder)  - amphetamine-dextroamphetamine (ADDERALL XR) 25 MG 24 hr capsule; Take 1 capsule by mouth  every morning.  Dispense: 90 capsule; Refill: 0  6. Insomnia, persistent Continue medication

## 2015-04-02 ENCOUNTER — Ambulatory Visit: Payer: 59 | Admitting: Family Medicine

## 2015-04-08 ENCOUNTER — Ambulatory Visit (INDEPENDENT_AMBULATORY_CARE_PROVIDER_SITE_OTHER): Payer: 59 | Admitting: Obstetrics and Gynecology

## 2015-04-08 ENCOUNTER — Encounter: Payer: Self-pay | Admitting: Obstetrics and Gynecology

## 2015-04-08 VITALS — BP 134/73 | HR 105 | Ht 69.5 in | Wt 197.0 lb

## 2015-04-08 DIAGNOSIS — N809 Endometriosis, unspecified: Secondary | ICD-10-CM

## 2015-04-08 DIAGNOSIS — N920 Excessive and frequent menstruation with regular cycle: Secondary | ICD-10-CM

## 2015-04-08 DIAGNOSIS — N816 Rectocele: Secondary | ICD-10-CM

## 2015-04-08 NOTE — Patient Instructions (Signed)
1.  Return for preoperative appointment. 2.  LAVH and posterior colporrhaphy is to be scheduled for end of October early November 2016

## 2015-04-08 NOTE — Progress Notes (Signed)
Patient ID: Robin Chan, female   DOB: 1970-11-23, 44 y.o.   MRN: [id number] 5 month f/u on endometriosis Want hysterectomy  Chief complaint: 1.  Symptomatically, endometriosis. 2.  Endometriosis confirmed by laparoscopy. 3.  Symptomatic rectocele.  Patient presents for interval follow-up and surgical management planning.  She has decided to proceed with definitive surgery through LAVH (possible RSO), and posterior colporrhaphy. Bleeding is heavy, irregular, and associated with chronic pain which is disrupting routine activities of daily living.  Past Medical history, past surgical history, problem list, medications, and allergies are reviewed  OBJECTIVE: BP 134/73 mmHg  Pulse 105  Ht 5' 9.5" (1.765 m)  Wt 197 lb (89.359 kg)  BMI 28.68 kg/m2  LMP 04/05/2015 (Exact Date) Well-appearing female in no acute distress. Abdomen: Soft, Slightly tender lower quadrants without peritoneal signs; without without organomegaly Back: No CVA tenderness. Pelvic exam: External genitalia-normal BUS-normal Vagina-moderate rectocele present with stool in the rectal vault; no significant cystocele Cervix-mild cervical motion tenderness. Uterus-mobile, 1-2/4 tender, top normal size Adnexa-nonpalpable, nontender. Rectovaginal-external exam normal; internal not done.  IMPRESSION: 1.  Symptomatic endometriosis-menorrhagia and chronic pelvic pain, persistent. 2.  Symptomatic rectocele. 3.  Desires definitive surgery.  PLAN: 1.  LAVH, possible RSO; posterior colporrhaphy 2.  Return for preop appointment.  A total of 15 minutes were spent face-to-face with the patient during this encounter and over half of that time dealt with counseling and coordination of care.  Brayton Mars, MD\

## 2015-04-11 ENCOUNTER — Other Ambulatory Visit: Payer: Self-pay

## 2015-04-11 ENCOUNTER — Telehealth: Payer: Self-pay | Admitting: Family Medicine

## 2015-04-11 DIAGNOSIS — F988 Other specified behavioral and emotional disorders with onset usually occurring in childhood and adolescence: Secondary | ICD-10-CM

## 2015-04-11 NOTE — Telephone Encounter (Signed)
Pt had a question regarding her amphetamine-dextroamphetamine being discontinued with her mail order.

## 2015-04-11 NOTE — Telephone Encounter (Signed)
Pt called stated she sent rx for adderall to mail order and u wrote wrong dose? She is suppost to be on 15mg  u wrote for 25?  also when they called her about this they stated they only carry 20mg  no 15? Please rewrite

## 2015-04-14 ENCOUNTER — Ambulatory Visit
Admission: RE | Admit: 2015-04-14 | Discharge: 2015-04-14 | Disposition: A | Discharge status: Home / Self Care | Payer: 59 | Source: Ambulatory Visit | Attending: Family Medicine | Admitting: Family Medicine

## 2015-04-14 DIAGNOSIS — Z1231 Encounter for screening mammogram for malignant neoplasm of breast: Secondary | ICD-10-CM | POA: Diagnosis present

## 2015-04-14 DIAGNOSIS — Z1239 Encounter for other screening for malignant neoplasm of breast: Secondary | ICD-10-CM

## 2015-04-14 MED ORDER — AMPHETAMINE-DEXTROAMPHET ER 20 MG PO CP24
20.0000 mg | ORAL_CAPSULE | Freq: Every day | ORAL | Status: DC
Start: 2015-04-14 — End: 2015-06-09

## 2015-04-14 NOTE — Telephone Encounter (Signed)
You refused rx but she states mail order called and stated they do not carry the 25 only 20 and need to be rewritten and sent in?

## 2015-04-14 NOTE — Telephone Encounter (Signed)
Okay, changed, but please call [facility] and cancel the 25 mg prescription

## 2015-04-16 ENCOUNTER — Telehealth: Payer: Self-pay

## 2015-04-17 NOTE — Telephone Encounter (Signed)
error 

## 2015-05-20 ENCOUNTER — Ambulatory Visit (INDEPENDENT_AMBULATORY_CARE_PROVIDER_SITE_OTHER): Payer: 59 | Admitting: Obstetrics and Gynecology

## 2015-05-20 ENCOUNTER — Encounter: Payer: Self-pay | Admitting: Obstetrics and Gynecology

## 2015-05-20 ENCOUNTER — Encounter
Admission: RE | Admit: 2015-05-20 | Discharge: 2015-05-20 | Disposition: A | Discharge status: Home / Self Care | Payer: 59 | Source: Ambulatory Visit | Attending: Obstetrics and Gynecology | Admitting: Obstetrics and Gynecology

## 2015-05-20 VITALS — BP 103/67 | HR 83 | Ht 69.0 in | Wt 189.0 lb

## 2015-05-20 DIAGNOSIS — Z01818 Encounter for other preprocedural examination: Secondary | ICD-10-CM

## 2015-05-20 DIAGNOSIS — N920 Excessive and frequent menstruation with regular cycle: Secondary | ICD-10-CM | POA: Diagnosis not present

## 2015-05-20 DIAGNOSIS — N816 Rectocele: Secondary | ICD-10-CM

## 2015-05-20 DIAGNOSIS — N809 Endometriosis, unspecified: Secondary | ICD-10-CM | POA: Diagnosis not present

## 2015-05-20 LAB — TYPE AND SCREEN
ABO/RH(D): O POS
Antibody Screen: NEGATIVE

## 2015-05-20 LAB — CBC WITH DIFFERENTIAL/PLATELET
Basophils Absolute: 0 10*3/uL (ref 0–0.1)
Basophils Relative: 1 %
Eosinophils Absolute: 0 10*3/uL (ref 0–0.7)
Eosinophils Relative: 1 %
HCT: 36.6 % (ref 35.0–47.0)
Hemoglobin: 11.9 g/dL — ABNORMAL LOW (ref 12.0–16.0)
Lymphocytes Relative: 28 %
Lymphs Abs: 1.6 10*3/uL (ref 1.0–3.6)
MCH: 29.2 pg (ref 26.0–34.0)
MCHC: 32.6 g/dL (ref 32.0–36.0)
MCV: 89.7 fL (ref 80.0–100.0)
Monocytes Absolute: 0.4 10*3/uL (ref 0.2–0.9)
Monocytes Relative: 7 %
Neutro Abs: 3.8 10*3/uL (ref 1.4–6.5)
Neutrophils Relative %: 65 %
Platelets: 341 10*3/uL (ref 150–440)
RBC: 4.08 MIL/uL (ref 3.80–5.20)
RDW: 13.7 % (ref 11.5–14.5)
WBC: 5.8 10*3/uL (ref 3.6–11.0)

## 2015-05-20 LAB — ABO/RH: ABO/RH(D): O POS

## 2015-05-20 LAB — RAPID HIV SCREEN (HIV 1/2 AB+AG)
HIV 1/2 Antibodies: NONREACTIVE
HIV-1 P24 Antigen - HIV24: NONREACTIVE

## 2015-05-20 NOTE — H&P (Addendum)
Patient ID: Robin Chan, female   DOB: October 04, 1970, 44 y.o.   MRN: [id number]   PREOPERATIVE HISTORY AND PHYSICAL  Date of surgery: 05/26/2015 Chief complaint: 1.  Symptomatic endometriosis: Menorrhagia and chronic pelvic pain, persistent. 2.  Symptomatic rectocele.  Chief complaint: 1. Symptomatically, endometriosis. 2. Endometriosis confirmed by laparoscopy. 3. Symptomatic rectocele.  Patient presents for surgical management  through LAVH (possible RSO), and posterior colporrhaphy. Bleeding is heavy, irregular, and associated with chronic pain which is disrupting routine activities of daily living.  Past Medical history, past surgical history, problem list,medications, and allergies are reviewed Past Medical History  Diagnosis Date  . GERD (gastroesophageal reflux disease)   . Endometriosis   . Anxiety   . MS (multiple sclerosis) ([place])   . Painful menstruation   . Rectocele   . Heavy period   . Dyspareunia   . Complex ovarian cyst   . Fibroid   . Chronic pelvic pain in female   . ADHD (attention deficit hyperactivity disorder)   . Allergy   . Depression    Past Surgical History  Procedure Laterality Date  . Ovary surgery Left 09/30/2014  . Breast biopsy Right 2005    benign    Review of systems: No recent illness. No chronic bronchitis or asthma. No fever, chills, sweats, nausea, vomiting or diarrhea. No history of bleeding disorder  OBJECTIVE: BP 103/67 mmHg  Pulse 83  Ht 5\' 9"  (1.753 m)  Wt 189 lb (85.73 kg)  BMI 27.90 kg/m2  LMP 05/19/2015  Well-appearing female in no acute distress. Neck: Supple without thyromegaly or adenopathy.  Full range of motion noted. Lungs: Clear. Heart: Regular rate and rhythm without murmur Abdomen: Soft, Slightly tender lower quadrants without peritoneal signs; without without organomegaly Back: No CVA tenderness. Pelvic exam: External genitalia-normal BUS-normal Vagina-moderate rectocele present with stool in  the rectal vault; no significant cystocele Cervix-mild cervical motion tenderness. Uterus-mobile, 1-2/4 tender, top normal size Adnexa-nonpalpable, nontender. Rectovaginal-external exam normal; internal not done.   IMPRESSION: 1. Symptomatic endometriosis-menorrhagia and chronic pelvic pain, persistent. 2. Symptomatic rectocele. 3. Desires definitive surgery.  PLAN: 1. LAVH, possible RSO; posterior colporrhaphy  Preoperative counseling: The patient is to undergo LAVH Possible RSO on 05/26/2015.  In addition, she is to undergo posterior colporrhaphy for symptomatic rectocele.  The patient is understanding of the planned procedures and is aware of and is accepting of all surgical risks which include but are not limited to bleeding, infection, however, organ injury with need for repair, blood clot disorders, anesthesia risks, etc.  All questions have been answered.  Informed consent is given.  Patient is ready and willing to proceed with surgery as scheduled.The right ovary will be removed if pathology is identified at the time of surgery.  If it is removed.  The patient will need to go on estrogen replacement therapy to manage her vasomotor symptoms and help prevent osteoporosis.  Brayton Mars, MD   Note: This dictation was prepared with Dragon dictation along with smaller phrase technology. Any transcriptional errors that result from this process are unintentional.

## 2015-05-20 NOTE — Patient Instructions (Signed)
  Your procedure is scheduled on: 05/26/2015 Report to Day Surgery. To find out your arrival time please call [phone] between 1PM - 3PM on 05/23/2015  Remember: Instructions that are not followed completely may result in serious medical risk, up to and including death, or upon the discretion of your surgeon and anesthesiologist your surgery may need to be rescheduled.    ___x_ 1. Do not eat food or drink liquids after midnight. No gum chewing or hard candies.     ___x_ 2. No Alcohol for 24 hours before or after surgery.   ____ 3. Bring all medications with you on the day of surgery if instructed.    ___x_ 4. Notify your doctor if there is any change in your medical condition     (cold, fever, infections).     Do not wear jewelry, make-up, hairpins, clips or nail polish.  Do not wear lotions, powders, or perfumes. You may wear deodorant.  Do not shave 48 hours prior to surgery. Men may shave face and neck.  Do not bring valuables to the hospital.    [facility] is not responsible for any belongings or valuables.               Contacts, dentures or bridgework may not be worn into surgery.  Leave your suitcase in the car. After surgery it may be brought to your room.  For patients admitted to the hospital, discharge time is determined by your                treatment team.   Patients discharged the day of surgery will not be allowed to drive home.   Please read over the following fact sheets that you were given:   Surgical Site Infection Prevention   ____ Take these medicines the morning of surgery with A SIP OF WATER:    1. xyzal  2. inhaler  3.   4.  5.  6.  ____ Fleet Enema (as directed)   ____ Use CHG Soap as directed  __x__ Use inhalers on the day of surgery  ____ Stop metformin 2 days prior to surgery    ____ Take 1/2 of usual insulin dose the night before surgery and none on the morning of surgery.   ____ Stop Coumadin/Plavix/aspirin on   _x___ Stop  Anti-inflammatories on today   __x__ Stop supplements until after surgery.omega 3 today    ____ Bring C-Pap to the hospital.

## 2015-05-20 NOTE — Progress Notes (Signed)
Patient ID: Robin Chan, female   DOB: [birth year], 44 y.o.   MRN: [id number]   PREOPERATIVE HISTORY AND PHYSICAL  Date of surgery: 05/26/2015 Chief complaint: 1.  Symptomatic endometriosis: Menorrhagia and chronic pelvic pain, persistent. 2.  Symptomatic rectocele.  Chief complaint: 1. Symptomatically, endometriosis. 2. Endometriosis confirmed by laparoscopy. 3. Symptomatic rectocele.  Patient presents for surgical management  through LAVH (possible RSO), and posterior colporrhaphy. Bleeding is heavy, irregular, and associated with chronic pain which is disrupting routine activities of daily living.  Past Medical history, past surgical history, problem list,medications, and allergies are reviewed Past Medical History  Diagnosis Date  . GERD (gastroesophageal reflux disease)   . Endometriosis   . Anxiety   . MS (multiple sclerosis) ([place])   . Painful menstruation   . Rectocele   . Heavy period   . Dyspareunia   . Complex ovarian cyst   . Fibroid   . Chronic pelvic pain in female   . ADHD (attention deficit hyperactivity disorder)   . Allergy   . Depression    Past Surgical History  Procedure Laterality Date  . Ovary surgery Left 09/30/2014  . Breast biopsy Right 2005    benign    Review of systems: No recent illness. No chronic bronchitis or asthma. No fever, chills, sweats, nausea, vomiting or diarrhea. No history of bleeding disorder  OBJECTIVE: BP 103/67 mmHg  Pulse 83  Ht 5\' 9"  (1.753 m)  Wt 189 lb (85.73 kg)  BMI 27.90 kg/m2  LMP 05/19/2015  Well-appearing female in no acute distress. Neck: Supple without thyromegaly or adenopathy.  Full range of motion noted. Lungs: Clear. Heart: Regular rate and rhythm without murmur Abdomen: Soft, Slightly tender lower quadrants without peritoneal signs; without without organomegaly Back: No CVA tenderness. Pelvic exam: External genitalia-normal BUS-normal Vagina-moderate rectocele present with stool in  the rectal vault; no significant cystocele Cervix-mild cervical motion tenderness. Uterus-mobile, 1-2/4 tender, top normal size Adnexa-nonpalpable, nontender. Rectovaginal-external exam normal; internal not done.   IMPRESSION: 1. Symptomatic endometriosis-menorrhagia and chronic pelvic pain, persistent. 2. Symptomatic rectocele. 3. Desires definitive surgery.  PLAN: 1. LAVH, possible RSO; posterior colporrhaphy  Preoperative counseling: The patient is to undergo LAVH possible RSO on 05/26/2015.  In addition, she is to undergo posterior colporrhaphy for symptomatic rectocele.  The patient is understanding of the planned procedures and is aware of and is accepting of all surgical risks which include but are not limited to bleeding, infection, however, organ injury with need for repair, blood clot disorders, anesthesia risks, etc.  All questions have been answered.  Informed consent is given.  Patient is ready and willing to proceed with surgery as scheduled. The right ovary will be removed if there is evidence of pathology at the time of surgery.  She does understand that removal will likely require estrogen replacement therapy for management of vasomotor symptoms and prevention of osteoporosis.  Brayton Mars, MD   Note: This dictation was prepared with Dragon dictation along with smaller phrase technology. Any transcriptional errors that result from this process are unintentional.

## 2015-05-21 LAB — RPR: RPR Ser Ql: NONREACTIVE

## 2015-05-24 ENCOUNTER — Other Ambulatory Visit: Payer: Self-pay | Admitting: Family Medicine

## 2015-05-26 ENCOUNTER — Encounter: Payer: Self-pay | Admitting: Family Medicine

## 2015-05-26 ENCOUNTER — Encounter: Payer: Self-pay | Admitting: *Deleted

## 2015-05-26 ENCOUNTER — Ambulatory Visit: Payer: 59 | Admitting: Anesthesiology

## 2015-05-26 ENCOUNTER — Observation Stay
Admission: RE | Admit: 2015-05-26 | Discharge: 2015-05-27 | Disposition: A | Discharge status: Home / Self Care | Payer: 59 | Source: Ambulatory Visit | Attending: Obstetrics and Gynecology | Admitting: Obstetrics and Gynecology

## 2015-05-26 ENCOUNTER — Encounter
Admission: RE | Disposition: A | Discharge status: Home / Self Care | Payer: Self-pay | Source: Ambulatory Visit | Attending: Obstetrics and Gynecology

## 2015-05-26 DIAGNOSIS — F909 Attention-deficit hyperactivity disorder, unspecified type: Secondary | ICD-10-CM | POA: Diagnosis not present

## 2015-05-26 DIAGNOSIS — F329 Major depressive disorder, single episode, unspecified: Secondary | ICD-10-CM | POA: Diagnosis not present

## 2015-05-26 DIAGNOSIS — K5900 Constipation, unspecified: Secondary | ICD-10-CM | POA: Diagnosis not present

## 2015-05-26 DIAGNOSIS — R102 Pelvic and perineal pain: Secondary | ICD-10-CM | POA: Diagnosis not present

## 2015-05-26 DIAGNOSIS — F419 Anxiety disorder, unspecified: Secondary | ICD-10-CM | POA: Insufficient documentation

## 2015-05-26 DIAGNOSIS — N941 Unspecified dyspareunia: Secondary | ICD-10-CM | POA: Diagnosis not present

## 2015-05-26 DIAGNOSIS — Z9889 Other specified postprocedural states: Secondary | ICD-10-CM | POA: Diagnosis not present

## 2015-05-26 DIAGNOSIS — N800 Endometriosis of uterus: Secondary | ICD-10-CM | POA: Diagnosis not present

## 2015-05-26 DIAGNOSIS — Z79899 Other long term (current) drug therapy: Secondary | ICD-10-CM | POA: Diagnosis not present

## 2015-05-26 DIAGNOSIS — N920 Excessive and frequent menstruation with regular cycle: Secondary | ICD-10-CM | POA: Insufficient documentation

## 2015-05-26 DIAGNOSIS — Z90710 Acquired absence of both cervix and uterus: Secondary | ICD-10-CM | POA: Insufficient documentation

## 2015-05-26 DIAGNOSIS — K219 Gastro-esophageal reflux disease without esophagitis: Secondary | ICD-10-CM | POA: Diagnosis not present

## 2015-05-26 DIAGNOSIS — J45909 Unspecified asthma, uncomplicated: Secondary | ICD-10-CM | POA: Diagnosis not present

## 2015-05-26 DIAGNOSIS — G35 Multiple sclerosis: Secondary | ICD-10-CM | POA: Diagnosis not present

## 2015-05-26 DIAGNOSIS — N816 Rectocele: Secondary | ICD-10-CM | POA: Diagnosis not present

## 2015-05-26 DIAGNOSIS — N809 Endometriosis, unspecified: Secondary | ICD-10-CM | POA: Diagnosis present

## 2015-05-26 DIAGNOSIS — G8929 Other chronic pain: Secondary | ICD-10-CM | POA: Insufficient documentation

## 2015-05-26 DIAGNOSIS — Z7951 Long term (current) use of inhaled steroids: Secondary | ICD-10-CM | POA: Diagnosis not present

## 2015-05-26 DIAGNOSIS — N72 Inflammatory disease of cervix uteri: Secondary | ICD-10-CM | POA: Diagnosis not present

## 2015-05-26 DIAGNOSIS — T7840XA Allergy, unspecified, initial encounter: Secondary | ICD-10-CM | POA: Diagnosis not present

## 2015-05-26 DIAGNOSIS — N840 Polyp of corpus uteri: Secondary | ICD-10-CM | POA: Diagnosis not present

## 2015-05-26 DIAGNOSIS — N7091 Salpingitis, unspecified: Secondary | ICD-10-CM | POA: Diagnosis not present

## 2015-05-26 HISTORY — PX: ABDOMINAL HYSTERECTOMY: SHX81

## 2015-05-26 HISTORY — PX: LAPAROSCOPIC ASSISTED VAGINAL HYSTERECTOMY: SHX5398

## 2015-05-26 HISTORY — PX: RECTOCELE REPAIR: SHX761

## 2015-05-26 LAB — POCT PREGNANCY, URINE: Preg Test, Ur: NEGATIVE

## 2015-05-26 SURGERY — HYSTERECTOMY, VAGINAL, LAPAROSCOPY-ASSISTED
Anesthesia: General | Wound class: Clean Contaminated

## 2015-05-26 MED ORDER — DOCUSATE SODIUM 100 MG PO CAPS
100.0000 mg | ORAL_CAPSULE | Freq: Two times a day (BID) | ORAL | Status: DC
  Administered 2015-05-26 – 2015-05-27 (×2): 100 mg via ORAL
  Filled 2015-05-26 (×2): qty 1

## 2015-05-26 MED ORDER — FENTANYL CITRATE (PF) 100 MCG/2ML IJ SOLN
25.0000 ug | INTRAMUSCULAR | Status: DC | PRN
  Administered 2015-05-26 (×5): 25 ug via INTRAVENOUS

## 2015-05-26 MED ORDER — MORPHINE SULFATE (PF) 2 MG/ML IV SOLN
1.0000 mg | INTRAVENOUS | Status: DC | PRN
  Administered 2015-05-26: 2 mg via INTRAVENOUS
  Filled 2015-05-26: qty 1

## 2015-05-26 MED ORDER — NEOSTIGMINE METHYLSULFATE 10 MG/10ML IV SOLN
INTRAVENOUS | Status: DC | PRN
  Administered 2015-05-26: 4 mg via INTRAVENOUS

## 2015-05-26 MED ORDER — LIDOCAINE HCL (CARDIAC) 20 MG/ML IV SOLN
INTRAVENOUS | Status: DC | PRN
Start: 2015-05-26 — End: 2015-05-26
  Administered 2015-05-26: 60 mg via INTRAVENOUS

## 2015-05-26 MED ORDER — BISACODYL 10 MG RE SUPP
10.0000 mg | Freq: Every day | RECTAL | Status: DC | PRN
  Filled 2015-05-26: qty 1

## 2015-05-26 MED ORDER — GLYCOPYRROLATE 0.2 MG/ML IJ SOLN
INTRAMUSCULAR | Status: DC | PRN
  Administered 2015-05-26: .8 mg via INTRAVENOUS

## 2015-05-26 MED ORDER — MIDAZOLAM HCL 5 MG/5ML IJ SOLN
INTRAMUSCULAR | Status: AC
  Filled 2015-05-26: qty 5

## 2015-05-26 MED ORDER — ACETAMINOPHEN 325 MG PO TABS: 650.0000 mg | ORAL_TABLET | ORAL | Status: DC | PRN

## 2015-05-26 MED ORDER — ESTROGENS, CONJUGATED 0.625 MG/GM VA CREA
TOPICAL_CREAM | VAGINAL | Status: AC
  Filled 2015-05-26: qty 30

## 2015-05-26 MED ORDER — ROCURONIUM BROMIDE 100 MG/10ML IV SOLN
INTRAVENOUS | Status: DC | PRN
  Administered 2015-05-26 (×2): 10 mg via INTRAVENOUS
  Administered 2015-05-26: 30 mg via INTRAVENOUS

## 2015-05-26 MED ORDER — FENTANYL CITRATE (PF) 100 MCG/2ML IJ SOLN
INTRAMUSCULAR | Status: AC
  Filled 2015-05-26: qty 2

## 2015-05-26 MED ORDER — ESTROGENS, CONJUGATED 0.625 MG/GM VA CREA
TOPICAL_CREAM | VAGINAL | Status: DC | PRN
  Administered 2015-05-26: 1 via VAGINAL

## 2015-05-26 MED ORDER — FENTANYL CITRATE (PF) 100 MCG/2ML IJ SOLN
INTRAMUSCULAR | Status: DC | PRN
  Administered 2015-05-26 (×4): 50 ug via INTRAVENOUS

## 2015-05-26 MED ORDER — FAMOTIDINE 20 MG PO TABS
20.0000 mg | ORAL_TABLET | Freq: Once | ORAL | Status: AC
  Administered 2015-05-26: 20 mg via ORAL

## 2015-05-26 MED ORDER — CLINDAMYCIN PHOSPHATE 900 MG/50ML IV SOLN
INTRAVENOUS | Status: AC
  Administered 2015-05-26: 900 mg via INTRAVENOUS
  Filled 2015-05-26: qty 50

## 2015-05-26 MED ORDER — LACTATED RINGERS IV SOLN: INTRAVENOUS | Status: DC

## 2015-05-26 MED ORDER — FAMOTIDINE 20 MG PO TABS
ORAL_TABLET | ORAL | Status: AC
  Administered 2015-05-26: 20 mg via ORAL
  Filled 2015-05-26: qty 1

## 2015-05-26 MED ORDER — LACTATED RINGERS IV SOLN
INTRAVENOUS | Status: DC
  Administered 2015-05-26 (×2): via INTRAVENOUS

## 2015-05-26 MED ORDER — LACTATED RINGERS IV SOLN
INTRAVENOUS | Status: DC
  Administered 2015-05-26: 19:00:00 via INTRAVENOUS

## 2015-05-26 MED ORDER — ACETAMINOPHEN 10 MG/ML IV SOLN
INTRAVENOUS | Status: AC
  Filled 2015-05-26: qty 100

## 2015-05-26 MED ORDER — ONDANSETRON HCL 4 MG/2ML IJ SOLN
INTRAMUSCULAR | Status: DC | PRN
Start: 2015-05-26 — End: 2015-05-26
  Administered 2015-05-26: 4 mg via INTRAVENOUS

## 2015-05-26 MED ORDER — CLINDAMYCIN PHOSPHATE 900 MG/50ML IV SOLN
900.0000 mg | INTRAVENOUS | Status: AC
  Administered 2015-05-26: 900 mg via INTRAVENOUS

## 2015-05-26 MED ORDER — SIMETHICONE 80 MG PO CHEW: 80.0000 mg | CHEWABLE_TABLET | Freq: Four times a day (QID) | ORAL | Status: DC | PRN

## 2015-05-26 MED ORDER — ACETAMINOPHEN 10 MG/ML IV SOLN
INTRAVENOUS | Status: DC | PRN
  Administered 2015-05-26: 1000 mg via INTRAVENOUS

## 2015-05-26 MED ORDER — BELLADONNA ALKALOIDS-OPIUM 16.2-60 MG RE SUPP
1.0000 | Freq: Two times a day (BID) | RECTAL | Status: DC | PRN
Start: 2015-05-26 — End: 2015-05-27
  Administered 2015-05-26: 1 via RECTAL
  Filled 2015-05-26: qty 1

## 2015-05-26 MED ORDER — CIPROFLOXACIN IN D5W 400 MG/200ML IV SOLN
INTRAVENOUS | Status: AC
  Filled 2015-05-26: qty 200

## 2015-05-26 MED ORDER — MIDAZOLAM HCL 2 MG/2ML IJ SOLN
INTRAMUSCULAR | Status: DC | PRN
Start: 2015-05-26 — End: 2015-05-26
  Administered 2015-05-26 (×2): 2 mg via INTRAVENOUS
  Administered 2015-05-26: 3 mg via INTRAVENOUS

## 2015-05-26 MED ORDER — PROPOFOL 10 MG/ML IV BOLUS
INTRAVENOUS | Status: DC | PRN
  Administered 2015-05-26: 40 mg via INTRAVENOUS
  Administered 2015-05-26: 160 mg via INTRAVENOUS

## 2015-05-26 MED ORDER — KETOROLAC TROMETHAMINE 30 MG/ML IJ SOLN: 30.0000 mg | Freq: Four times a day (QID) | INTRAMUSCULAR | Status: DC

## 2015-05-26 MED ORDER — OXYCODONE-ACETAMINOPHEN 5-325 MG PO TABS
1.0000 | ORAL_TABLET | ORAL | Status: DC | PRN
  Administered 2015-05-26 – 2015-05-27 (×4): 1 via ORAL
  Filled 2015-05-26 (×2): qty 1
  Filled 2015-05-26: qty 2
  Filled 2015-05-26: qty 1

## 2015-05-26 MED ORDER — ONDANSETRON HCL 4 MG/2ML IJ SOLN: 4.0000 mg | Freq: Once | INTRAMUSCULAR | Status: DC | PRN

## 2015-05-26 MED ORDER — KETOROLAC TROMETHAMINE 30 MG/ML IJ SOLN
30.0000 mg | Freq: Four times a day (QID) | INTRAMUSCULAR | Status: DC
  Administered 2015-05-26 – 2015-05-27 (×5): 30 mg via INTRAVENOUS
  Filled 2015-05-26 (×5): qty 1

## 2015-05-26 MED ORDER — PHENYLEPHRINE HCL 10 MG/ML IJ SOLN
INTRAMUSCULAR | Status: DC | PRN
  Administered 2015-05-26 (×6): 100 ug via INTRAVENOUS

## 2015-05-26 MED ORDER — CIPROFLOXACIN IN D5W 400 MG/200ML IV SOLN
400.0000 mg | INTRAVENOUS | Status: AC
  Administered 2015-05-26: 400 mg via INTRAVENOUS

## 2015-05-26 MED ORDER — DEXAMETHASONE SODIUM PHOSPHATE 4 MG/ML IJ SOLN
INTRAMUSCULAR | Status: DC | PRN
  Administered 2015-05-26: 5 mg via INTRAVENOUS

## 2015-05-26 SURGICAL SUPPLY — 53 items
BAG URO DRAIN 2000ML W/SPOUT (MISCELLANEOUS) ×3 IMPLANT
BLADE SURG 15 STRL LF DISP TIS (BLADE) ×1 IMPLANT
BLADE SURG 15 STRL SS (BLADE) ×2
BLADE SURG SZ10 CARB STEEL (BLADE) IMPLANT
BLADE SURG SZ11 CARB STEEL (BLADE) ×3 IMPLANT
CANISTER SUCT 1200ML W/VALVE (MISCELLANEOUS) ×3 IMPLANT
CATH FOLEY 2WAY  5CC 16FR (CATHETERS) ×2
CATH URTH 16FR FL 2W BLN LF (CATHETERS) ×1 IMPLANT
CHLORAPREP W/TINT 26ML (MISCELLANEOUS) ×3 IMPLANT
CLOSURE WOUND 1/2 X4 (GAUZE/BANDAGES/DRESSINGS) ×1
DRAPE PERI LITHO V/GYN (MISCELLANEOUS) IMPLANT
DRAPE SHEET LG 3/4 BI-LAMINATE (DRAPES) ×3 IMPLANT
DRAPE UNDER BUTTOCK W/FLU (DRAPES) IMPLANT
DRSG TEGADERM 2X2.25 PEDS (GAUZE/BANDAGES/DRESSINGS) ×9 IMPLANT
FILTER LAP SMOKE EVAC STRL (MISCELLANEOUS) ×3 IMPLANT
GAUZE PACK 2X3YD (MISCELLANEOUS) ×6 IMPLANT
GLOVE BIO SURGEON STRL SZ8 (GLOVE) ×12 IMPLANT
GLOVE INDICATOR 8.0 STRL GRN (GLOVE) ×3 IMPLANT
GOWN STRL REUS W/ TWL LRG LVL3 (GOWN DISPOSABLE) ×2 IMPLANT
GOWN STRL REUS W/ TWL XL LVL3 (GOWN DISPOSABLE) ×2 IMPLANT
GOWN STRL REUS W/TWL LRG LVL3 (GOWN DISPOSABLE) ×4
GOWN STRL REUS W/TWL XL LVL3 (GOWN DISPOSABLE) ×4
HANDLE YANKAUER SUCT BULB TIP (MISCELLANEOUS) IMPLANT
IRRIGATION STRYKERFLOW (MISCELLANEOUS) IMPLANT
IRRIGATOR STRYKERFLOW (MISCELLANEOUS)
IV LACTATED RINGERS 1000ML (IV SOLUTION) ×3 IMPLANT
KIT RM TURNOVER CYSTO AR (KITS) ×3 IMPLANT
LABEL OR SOLS (LABEL) IMPLANT
LIQUID BAND (GAUZE/BANDAGES/DRESSINGS) IMPLANT
NS IRRIG 500ML POUR BTL (IV SOLUTION) ×3 IMPLANT
PACK BASIN MINOR ARMC (MISCELLANEOUS) ×3 IMPLANT
PACK GYN LAPAROSCOPIC (MISCELLANEOUS) ×3 IMPLANT
PAD GROUND ADULT SPLIT (MISCELLANEOUS) ×24 IMPLANT
PAD OB MATERNITY 4.3X12.25 (PERSONAL CARE ITEMS) ×3 IMPLANT
PAD PREP 24X41 OB/GYN DISP (PERSONAL CARE ITEMS) ×3 IMPLANT
PENCIL ELECTRO HAND CTR (MISCELLANEOUS) ×3 IMPLANT
SCISSORS METZENBAUM CVD 33 (INSTRUMENTS) IMPLANT
SHEARS HARMONIC ACE PLUS 36CM (ENDOMECHANICALS) ×3 IMPLANT
SLEEVE ENDOPATH XCEL 5M (ENDOMECHANICALS) ×6 IMPLANT
SPONGE XRAY 4X4 16PLY STRL (MISCELLANEOUS) IMPLANT
STRIP CLOSURE SKIN 1/2X4 (GAUZE/BANDAGES/DRESSINGS) ×2 IMPLANT
SUT CHROMIC 2 0 CT 1 (SUTURE) ×18 IMPLANT
SUT CHROMIC 2 0 SH (SUTURE) ×3 IMPLANT
SUT VIC AB 0 CT1 27 (SUTURE) ×2
SUT VIC AB 0 CT1 27XCR 8 STRN (SUTURE) ×1 IMPLANT
SUT VIC AB 0 CT1 36 (SUTURE) ×18 IMPLANT
SUT VIC AB 0 CT2 27 (SUTURE) ×3 IMPLANT
SUT VIC AB 2-0 CT1 (SUTURE) IMPLANT
SUT VIC AB 2-0 UR6 27 (SUTURE) ×60 IMPLANT
SUT VIC AB 4-0 FS2 27 (SUTURE) IMPLANT
SYRINGE 10CC LL (SYRINGE) ×3 IMPLANT
TROCAR XCEL NON-BLD 5MMX100MML (ENDOMECHANICALS) ×3 IMPLANT
TUBING INSUFFLATOR HEATED (MISCELLANEOUS) ×3 IMPLANT

## 2015-05-26 NOTE — Interval H&P Note (Signed)
History and Physical Interval Note:  05/26/2015 7:36 AM  Robin Chan  has presented today for surgery, with the diagnosis of ENDOMETRIOSIS,MENORRHAGIA,RECTOCELE  The various methods of treatment have been discussed with the patient and family. After consideration of risks, benefits and other options for treatment, the patient has consented to  Procedure(s): LAPAROSCOPIC ASSISTED VAGINAL HYSTERECTOMY (N/A) POSTERIOR REPAIR (RECTOCELE) (N/A) as a surgical intervention .  The patient's history has been reviewed, patient examined, no change in status, stable for surgery.  I have reviewed the patient's chart and labs.  Questions were answered to the patient's satisfaction.     Robin Chan

## 2015-05-26 NOTE — OR Nursing (Signed)
Patient continues to C/o pain

## 2015-05-26 NOTE — Transfer of Care (Signed)
Immediate Anesthesia Transfer of Care Note  Patient: JILLIANNA STANEK  Procedure(s) Performed: Procedure(s): LAPAROSCOPIC ASSISTED VAGINAL HYSTERECTOMY, with right salpingectomy (N/A) POSTERIOR REPAIR (RECTOCELE) (N/A)  Patient Location: PACU  Anesthesia Type:General  Level of Consciousness: awake and pateint uncooperative  Airway & Oxygen Therapy: Patient Spontanous Breathing and Patient connected to face mask oxygen  Post-op Assessment: Report given to RN and Post -op Vital signs reviewed and stable  Post vital signs: stable  Last Vitals:  Filed Vitals:   05/26/15 1103  BP: 128/71  Pulse: 95  Temp: 36.7 C  Resp: 22    Complications: No apparent anesthesia complications

## 2015-05-26 NOTE — Anesthesia Procedure Notes (Signed)
Procedure Name: Intubation Date/Time: 05/26/2015 7:48 AM Performed by: Aline Brochure Pre-anesthesia Checklist: Patient identified, Emergency Drugs available, Suction available and Patient being monitored Patient Re-evaluated:Patient Re-evaluated prior to inductionOxygen Delivery Method: Circle system utilized Preoxygenation: Pre-oxygenation with 100% oxygen Intubation Type: IV induction Ventilation: Mask ventilation without difficulty Laryngoscope Size: Mac and 3 Grade View: Grade II Tube type: Oral Tube size: 7.0 mm Number of attempts: 1 Airway Equipment and Method: Patient positioned with wedge pillow and Stylet Placement Confirmation: ETT inserted through vocal cords under direct vision,  positive ETCO2 and breath sounds checked- equal and bilateral Secured at: 22 cm Tube secured with: Tape Dental Injury: Teeth and Oropharynx as per pre-operative assessment

## 2015-05-26 NOTE — H&P (View-Only) (Signed)
Patient ID: Robin Chan, female   DOB: Aug 17, 1970, 44 y.o.   MRN: [id number]   PREOPERATIVE HISTORY AND PHYSICAL  Date of surgery: 05/26/2015 Chief complaint: 1.  Symptomatic endometriosis: Menorrhagia and chronic pelvic pain, persistent. 2.  Symptomatic rectocele.  Chief complaint: 1. Symptomatically, endometriosis. 2. Endometriosis confirmed by laparoscopy. 3. Symptomatic rectocele.  Patient presents for surgical management  through LAVH (possible RSO), and posterior colporrhaphy. Bleeding is heavy, irregular, and associated with chronic pain which is disrupting routine activities of daily living.  Past Medical history, past surgical history, problem list,medications, and allergies are reviewed Past Medical History  Diagnosis Date  . GERD (gastroesophageal reflux disease)   . Endometriosis   . Anxiety   . MS (multiple sclerosis) ([place])   . Painful menstruation   . Rectocele   . Heavy period   . Dyspareunia   . Complex ovarian cyst   . Fibroid   . Chronic pelvic pain in female   . ADHD (attention deficit hyperactivity disorder)   . Allergy   . Depression    Past Surgical History  Procedure Laterality Date  . Ovary surgery Left 09/30/2014  . Breast biopsy Right 2005    benign    Review of systems: No recent illness. No chronic bronchitis or asthma. No fever, chills, sweats, nausea, vomiting or diarrhea. No history of bleeding disorder  OBJECTIVE: BP 103/67 mmHg  Pulse 83  Ht 5\' 9"  (1.753 m)  Wt 189 lb (85.73 kg)  BMI 27.90 kg/m2  LMP 05/19/2015  Well-appearing female in no acute distress. Neck: Supple without thyromegaly or adenopathy.  Full range of motion noted. Lungs: Clear. Heart: Regular rate and rhythm without murmur Abdomen: Soft, Slightly tender lower quadrants without peritoneal signs; without without organomegaly Back: No CVA tenderness. Pelvic exam: External genitalia-normal BUS-normal Vagina-moderate rectocele present with stool in  the rectal vault; no significant cystocele Cervix-mild cervical motion tenderness. Uterus-mobile, 1-2/4 tender, top normal size Adnexa-nonpalpable, nontender. Rectovaginal-external exam normal; internal not done.   IMPRESSION: 1. Symptomatic endometriosis-menorrhagia and chronic pelvic pain, persistent. 2. Symptomatic rectocele. 3. Desires definitive surgery.  PLAN: 1. LAVH, possible RSO; posterior colporrhaphy  Preoperative counseling: The patient is to undergo LAVH Possible RSO on 05/26/2015.  In addition, she is to undergo posterior colporrhaphy for symptomatic rectocele.  The patient is understanding of the planned procedures and is aware of and is accepting of all surgical risks which include but are not limited to bleeding, infection, however, organ injury with need for repair, blood clot disorders, anesthesia risks, etc.  All questions have been answered.  Informed consent is given.  Patient is ready and willing to proceed with surgery as scheduled.The right ovary will be removed if pathology is identified at the time of surgery.  If it is removed.  The patient will need to go on estrogen replacement therapy to manage her vasomotor symptoms and help prevent osteoporosis.  Brayton Mars, MD   Note: This dictation was prepared with Dragon dictation along with smaller phrase technology. Any transcriptional errors that result from this process are unintentional.

## 2015-05-26 NOTE — Op Note (Signed)
OPERATIVE NOTE:  Robin Chan PROCEDURE DATE: 05/26/2015   PREOPERATIVE DIAGNOSIS:  1. Menorrhagia 2. Symptomatic endometriosis 3. Symptomatic moderate rectocele POSTOPERATIVE DIAGNOSIS:  1. Menorrhagia 2. Symptomatic endometriosis 3. Symptomatic moderate rectocele PROCEDURE:  1. LAVH right salpingectomy 2. Posterior colporrhaphy SURGEON:  Brayton Mars, MD ASSISTANTS: Dr. Marcelline Mates and PA-S [place]: General INDICATIONS: 44 y.o. [id number] with history of endometriosis, and worsening menorrhagia and pelvic pain, in addition to chronic constipation from moderate rectocele and MS, presents for definitive surgery  FINDINGS:   1. Fibroid uterus, 12 weeks size 2. Normal right ovary 3. Moderate rectocele   I/O's: Total I/O In: 1400 [I.V.:1400] Out: 300 [Blood:300] COUNTS:  YES SPECIMENS: Uterus with cervix and right tube ANTIBIOTIC PROPHYLAXIS:Clindamycin and Cipro COMPLICATIONS: None immediate  PROCEDURE IN DETAIL: Patient was brought to the operating room versus placed in the supine position. General endotracheal anesthesia was induced without difficulty. She was placed in the dorsal lithotomy position using the bumblebee stirrups. A ChloraPrep and Betadine abdominal perineal intravaginal prep and drape was performed in standard fashion. Foley catheter was placed and was draining clear yellow urine. LAVH was performed and standard fashion. A 5 mm incision som direct entry was made with the Optiview laparoscopic trocar system with no evidence of bowel or vascular injury. 2 other 5 mm ports were placed in the right and left lower quadrants respectively under direct vision. The above-noted findings were photo documented. Right salpingectomy was then performed with the Ace Harmonic scalpel. Alligator grasper forceps were used to grasp the tube and the Harmonic scalpel was used to clamp desiccate and cut the mesosalpinx mobilize the structure. Once adequately mobilized  and was placed in the cul-de-sac for removal the vaginal part case. The round ligaments were grasped coagulated and cut using the Ace Harmonic scalpel. The cardinal broad ligament complexes were then taken down using the Ace Harmonic scalpel down level of the cervicovaginal fashion. The bladder flap was created. The uterine arteries were skeletonized and then cauterized using the Kleppinger bipolar forceps. These vessels were then transected. Completing the abdominal part of the LAVH. Within the vagina the weighted speculum placed. A posterior colpotomy was made with Mayo scissors. Uterosacral ligaments were clamped, cut and stick tied using 0 Vicryl suture. The vagina was dissected off of the cervix in order to mobilize the bladder using the Bovie cautery and sharp and blunt dissection. Once adequately mobilized the remainder of the cardinal broad ligament complexes were clamped cut and stick tied. The specimen was then removed from the operative field including the right fallopian tube. Posterior cuff was run using 0 Vicryl suture in a baseball stitch manner. Vagina was then reapproximated using simple sutures of 2-0 chromic. Posterior colporrhaphy was done routinely. Allis's were used to grasp the lateral aspects of the introitus. A triangular wedge portion of vagina was excised scalpel. The vagina was undermined using Metzenbaum scissors and incised in the midline. Geraldine Contras retractors were used to facilitate exposure. The perivesical fascia was dissected from the vagina through sharp and blunt dissection. Once adequately mobilized and elevated this were reapproximated using 0 Vicryl suture in a horizontal mattress technique. The excess vagina was trimmed. The vagina was reapproximated using 2-0 chromic sutures in a simple interrupted manner. Following completion of the posterior colporrhaphy, one last look with laparoscopy was made verified hemostasis. Residual blood in the cul-de-sac was aspirated using the  irrigator aspirator instrument. PATIENT was then removed from the abdominal pelvic cavity. Pneumoperitoneum was released. Incisions were closed  with subcuticular stitches of 4-0 Vicryl. Dressings were placed. Patient was then awakened mobilized and taken to the recovery room in satisfactory condition.  Robin Chan A. Robin Plants, MD, [facility]

## 2015-05-26 NOTE — Anesthesia Preprocedure Evaluation (Addendum)
Anesthesia Evaluation  Patient identified by MRN, date of birth, ID band Patient awake    Reviewed: Allergy & Precautions, NPO status , Patient's Chart, lab work & pertinent test results  History of Anesthesia Complications Negative for: history of anesthetic complications  Airway Mallampati: II       Dental  (+) Teeth Intact   Pulmonary asthma (no inhalers x 1 yr) ,           Cardiovascular negative cardio ROS       Neuro/Psych  Headaches, Anxiety Depression  Neuromuscular disease    GI/Hepatic Neg liver ROS, GERD (hx in past)  ,  Endo/Other  negative endocrine ROS  Renal/GU negative Renal ROS     Musculoskeletal   Abdominal   Peds  Hematology   Anesthesia Other Findings   Reproductive/Obstetrics                             Anesthesia Physical Anesthesia Plan  ASA: II  Anesthesia Plan: General   Post-op Pain Management:    Induction: Intravenous  Airway Management Planned: Oral ETT  Additional Equipment:   Intra-op Plan:   Post-operative Plan:   Informed Consent: I have reviewed the patients History and Physical, chart, labs and discussed the procedure including the risks, benefits and alternatives for the proposed anesthesia with the patient or authorized representative who has indicated his/her understanding and acceptance.     Plan Discussed with:   Anesthesia Plan Comments:         Anesthesia Quick Evaluation

## 2015-05-26 NOTE — Anesthesia Postprocedure Evaluation (Signed)
  Anesthesia Post-op Note  Patient: Robin Chan  Procedure(s) Performed: Procedure(s): LAPAROSCOPIC ASSISTED VAGINAL HYSTERECTOMY, with right salpingectomy (N/A) POSTERIOR REPAIR (RECTOCELE) (N/A)  Anesthesia type:General  Patient location: PACU  Post pain: Pain level controlled  Post assessment: Post-op Vital signs reviewed, Patient's Cardiovascular Status Stable, Respiratory Function Stable, Patent Airway and No signs of Nausea or vomiting  Post vital signs: Reviewed and stable  Last Vitals:  Filed Vitals:   05/26/15 1150  BP:   Pulse: 83  Temp:   Resp: 11    Level of consciousness: awake, alert  and patient cooperative  Complications: No apparent anesthesia complications

## 2015-05-27 DIAGNOSIS — N72 Inflammatory disease of cervix uteri: Secondary | ICD-10-CM | POA: Diagnosis not present

## 2015-05-27 LAB — SURGICAL PATHOLOGY

## 2015-05-27 LAB — HEMOGLOBIN: Hemoglobin: 9.6 g/dL — ABNORMAL LOW (ref 12.0–16.0)

## 2015-05-27 MED ORDER — OXYCODONE-ACETAMINOPHEN 5-325 MG PO TABS: 1.0000 | ORAL_TABLET | ORAL | Status: DC | PRN

## 2015-05-27 MED ORDER — DOCUSATE SODIUM 100 MG PO CAPS: 100.0000 mg | ORAL_CAPSULE | Freq: Two times a day (BID) | ORAL | Status: DC

## 2015-05-27 MED ORDER — IBUPROFEN 800 MG PO TABS: 800.0000 mg | ORAL_TABLET | Freq: Three times a day (TID) | ORAL | Status: DC

## 2015-05-27 NOTE — Discharge Summary (Signed)
Physician Discharge Summary  Patient ID: Robin Chan MRN: [id number] DOB/AGE: [birth year] 44 y.o.  Admit date: 05/26/2015 Discharge date: 05/27/2015  Admission Diagnoses:  1. Symptomatic endometriosis: menorrhagia, chronic pelvic pain 2. Symptomatic rectocele  Discharge Diagnoses:  1.S/P  LAVH  2. S/P Posterior colporrhaphy   Discharged Condition: good  Hospital Course: Patient presented 05/26/15 for LAVH due to symptomatic endometriosis, and a posterior colporrhaphy due to symptomatic rectocele. Patient underwent surgery under general anesthesia on 81/01/75 without complication.  05/27/2015 catheter and vaginal packing was removed, patient reported pain was under control, urinating normally without blood,  eating normally, and ambulating well.   Consults: None  Significant Diagnostic Studies: labs: Hemoglobin 9.6 (from 11.9 one week prior).  Surgical pathology not reported  Treatments: IV hydration, pain control  Discharge Exam: Blood pressure 111/60, pulse 67, temperature 98.4 F (36.9 C), temperature source Oral, resp. rate 18, height 5\' 9"  (1.753 m), weight 189 lb (85.73 kg), SpO2 98 %. General appearance: alert and no distress Resp: clear to auscultation bilaterally Cardio: regular rate and rhythm and systolic murmur: early systolic 1/6, . . GI: tender. Wounds covered with steri-strips. No redness or draining. Extremities: no edema, redness or tenderness in the calves or thighs Skin: Skin color, texture, turgor normal. No rashes or lesions Neurologic: Grossly normal  Disposition:      Medication List    ASK your doctor about these medications        albuterol 108 (90 BASE) MCG/ACT inhaler  Commonly known as:  PROVENTIL HFA;VENTOLIN HFA  Inhale 1 puff into the lungs every 6 (six) hours as needed for wheezing or shortness of breath.     amphetamine-dextroamphetamine 20 MG 24 hr capsule  Commonly known as:  ADDERALL XR  Take 1 capsule (20 mg total) by mouth  daily.     cetirizine 10 MG tablet  Commonly known as:  ZYRTEC  Take 10 mg by mouth daily.     cyclobenzaprine 5 MG tablet  Commonly known as:  FLEXERIL  Take 1 tablet (5 mg total) by mouth 3 (three) times daily as needed for muscle spasms.     EPINEPHrine 0.3 mg/0.3 mL Soaj injection  Commonly known as:  EPI-PEN  Inject 0.3 mg into the muscle once.     escitalopram 20 MG tablet  Commonly known as:  LEXAPRO  Take 1 tablet by mouth  daily     Fish Oil 1000 MG Caps  Take by mouth.     fluticasone 50 MCG/ACT nasal spray  Commonly known as:  FLONASE  Place 2 sprays into both nostrils daily.     ibuprofen 800 MG tablet  Commonly known as:  ADVIL,MOTRIN  Take 1 tablet by mouth as needed.     levocetirizine 5 MG tablet  Commonly known as:  XYZAL  Take 1 tablet (5 mg total) by mouth every evening.     montelukast 10 MG tablet  Commonly known as:  SINGULAIR  Take 1 tablet (10 mg total) by mouth at bedtime.     MULTIVITAMIN ADULT PO  Take by mouth.     PROBIOTIC DAILY PO  Take by mouth.     Vitamin D3 1000 UNITS Chew  Chew 2,000 Units by mouth daily.     zolpidem 12.5 MG CR tablet  Commonly known as:  AMBIEN CR  Take 1 tablet (12.5 mg total) by mouth at bedtime as needed for sleep.         Patient will be discharged with Percocet  and Motrin for pain control.  Colace100 mg BISD x 6 weeks No heavy lifting or straining x 6 weeks. Counseled on post-op care instructions including: no heavy lifting, strenuous exercising, or sexual intercourse for 6 weeks. Follow up with provider in 1 week.    Signed: Hubert Azure, PA-S Brayton Mars, MD  05/27/2015, 9:23 AM    I have seen, interviewed, and examined the patient in conjunction with the [facility]A. student and affirm the diagnosis and management plan. Teesha Ohm A. Britton Perkinson, MD, Cherlynn June

## 2015-05-27 NOTE — Progress Notes (Signed)
D/C order from MD.  Reviewed d/c instructions and prescriptions with patient and answered any questions.  Patient d/c home via wheelchair by nursing/auxillary. 

## 2015-06-03 ENCOUNTER — Encounter: Payer: Self-pay | Admitting: Obstetrics and Gynecology

## 2015-06-03 ENCOUNTER — Ambulatory Visit (INDEPENDENT_AMBULATORY_CARE_PROVIDER_SITE_OTHER): Payer: 59 | Admitting: Obstetrics and Gynecology

## 2015-06-03 VITALS — BP 108/69 | HR 99 | Ht 69.0 in | Wt 189.8 lb

## 2015-06-03 DIAGNOSIS — Z09 Encounter for follow-up examination after completed treatment for conditions other than malignant neoplasm: Secondary | ICD-10-CM

## 2015-06-03 DIAGNOSIS — D259 Leiomyoma of uterus, unspecified: Secondary | ICD-10-CM

## 2015-06-03 DIAGNOSIS — N816 Rectocele: Secondary | ICD-10-CM

## 2015-06-03 NOTE — Progress Notes (Signed)
Patient ID: Robin Chan, female   DOB: [birth year], 44 y.o.   MRN: [id number]   Chief complaint: 1.  One week postop check. 2.  Status post LAVH, right salpingectomy and posterior colporrhaphy.  Patient feels reasonably well 1 week postop.  She is minimizing use of analgesics at this time, using 2% day and approximately 6 ibuprofen per day. Patient is experiencing minimal vaginal spotting. Bowel function is normal. Bladder function is normal.  She denies scratch that She denies fevers, chills or sweats.  Past medical history, past surgical history, problem list, medications, and allergies are reviewed.  Review of systems: Per HPI.  OBJECTIVE: BP 108/69 mmHg  Pulse 99  Ht 5\' 9"  (1.753 m)  Wt 189 lb 12.8 oz (86.093 kg)  BMI 28.02 kg/m2  LMP 05/19/2015 Pleasant, well-appearing African-American female in no acute distress.  She is alert and oriented. Abdomen: Soft, nontender; OpSite dressings are removed.-Normal healing laparoscopy port sites. Pelvic: Deferred.  IMPRESSION: 1.  Status post LAVH, right salpingectomy and anterior posterior colporrhaphy. 2.  Pathology consistent with adenomyosis and uterine fibroids.  PLAN: 1.  Continue with routine postoperative precautions. 2.  Continue with stool softeners twice a day. 3.  Return in 5 weeks for final postop check  Brayton Mars, MD   Note: This dictation was prepared with Dragon dictation along with smaller phrase technology. Any transcriptional errors that result from this process are unintentional.

## 2015-06-04 NOTE — Patient Instructions (Signed)
1.  Continuous stool softeners twice a day. 2.  Continue with limited lifting and straining, to avoid damage to the posterior repair. 3.  Return in 5 weeks for final postop check. 4.  Pathology from surgery showed fibroids and adenomyosis

## 2015-06-09 ENCOUNTER — Other Ambulatory Visit: Payer: Self-pay | Admitting: Obstetrics and Gynecology

## 2015-06-09 ENCOUNTER — Other Ambulatory Visit (INDEPENDENT_AMBULATORY_CARE_PROVIDER_SITE_OTHER): Payer: 59 | Admitting: Obstetrics and Gynecology

## 2015-06-09 ENCOUNTER — Other Ambulatory Visit: Payer: Self-pay | Admitting: Family Medicine

## 2015-06-09 VITALS — BP 125/80 | HR 93 | Ht 69.0 in | Wt 190.1 lb

## 2015-06-09 DIAGNOSIS — R300 Dysuria: Secondary | ICD-10-CM | POA: Diagnosis not present

## 2015-06-09 LAB — POCT URINALYSIS DIPSTICK
Bilirubin, UA: NEGATIVE
Glucose, UA: NEGATIVE
Ketones, UA: NEGATIVE
Nitrite, UA: NEGATIVE
Protein, UA: NEGATIVE
Spec Grav, UA: <=1.005
Urobilinogen, UA: 0.2
pH, UA: 6

## 2015-06-09 MED ORDER — NITROFURANTOIN MONOHYD MACRO 100 MG PO CAPS: 100.0000 mg | ORAL_CAPSULE | Freq: Two times a day (BID) | ORAL | Status: DC

## 2015-06-09 MED ORDER — UROGESIC-BLUE 81.6 MG PO TABS: 81.6000 mg | ORAL_TABLET | ORAL | Status: DC | PRN

## 2015-06-09 NOTE — Progress Notes (Unsigned)
Pt presents for possible uti. Pt states since Friday she has felt achy and had burning with urination. No fevers that she knows of. Pt is still having a d/C. Will do U/a and cns. Pt given samples of Urogesic blue and started on Macrobid.  Advised to push fluids- cranberry juice, H2O. Tylenol prn. If no better in a few days to make an appt. Will contact pt when culture is back.

## 2015-06-09 NOTE — Telephone Encounter (Signed)
Medication refill on adderall 15mg . Requesting a 30 day supply until her next appointment.

## 2015-06-09 NOTE — Telephone Encounter (Signed)
Patient requesting refill. 

## 2015-06-10 LAB — URINE CULTURE: Organism ID, Bacteria: NO GROWTH

## 2015-06-10 MED ORDER — AMPHETAMINE-DEXTROAMPHET ER 15 MG PO CP24: 15.0000 mg | ORAL_CAPSULE | Freq: Every day | ORAL | Status: DC

## 2015-06-27 ENCOUNTER — Encounter: Payer: Self-pay | Admitting: Obstetrics and Gynecology

## 2015-06-27 ENCOUNTER — Encounter: Payer: Self-pay | Admitting: Family Medicine

## 2015-06-27 ENCOUNTER — Ambulatory Visit (INDEPENDENT_AMBULATORY_CARE_PROVIDER_SITE_OTHER): Payer: 59 | Admitting: Family Medicine

## 2015-06-27 VITALS — BP 116/80 | HR 96 | Temp 98.8°F | Resp 14 | Ht 69.0 in | Wt 189.6 lb

## 2015-06-27 DIAGNOSIS — G4700 Insomnia, unspecified: Secondary | ICD-10-CM

## 2015-06-27 DIAGNOSIS — J302 Other seasonal allergic rhinitis: Secondary | ICD-10-CM

## 2015-06-27 DIAGNOSIS — G35 Multiple sclerosis: Secondary | ICD-10-CM | POA: Diagnosis not present

## 2015-06-27 DIAGNOSIS — F909 Attention-deficit hyperactivity disorder, unspecified type: Secondary | ICD-10-CM | POA: Diagnosis not present

## 2015-06-27 DIAGNOSIS — F330 Major depressive disorder, recurrent, mild: Secondary | ICD-10-CM | POA: Diagnosis not present

## 2015-06-27 DIAGNOSIS — J309 Allergic rhinitis, unspecified: Secondary | ICD-10-CM | POA: Diagnosis not present

## 2015-06-27 DIAGNOSIS — M62830 Muscle spasm of back: Secondary | ICD-10-CM

## 2015-06-27 DIAGNOSIS — F988 Other specified behavioral and emotional disorders with onset usually occurring in childhood and adolescence: Secondary | ICD-10-CM

## 2015-06-27 DIAGNOSIS — J3089 Other allergic rhinitis: Secondary | ICD-10-CM

## 2015-06-27 MED ORDER — AMPHETAMINE-DEXTROAMPHET ER 15 MG PO CP24: 15.0000 mg | ORAL_CAPSULE | Freq: Every day | ORAL | Status: DC

## 2015-06-27 MED ORDER — FLUTICASONE PROPIONATE 50 MCG/ACT NA SUSP: 2.0000 | Freq: Every day | NASAL | Status: DC

## 2015-06-27 MED ORDER — CYCLOBENZAPRINE HCL 5 MG PO TABS: 5.0000 mg | ORAL_TABLET | Freq: Three times a day (TID) | ORAL | Status: DC | PRN

## 2015-06-27 MED ORDER — MONTELUKAST SODIUM 10 MG PO TABS: 10.0000 mg | ORAL_TABLET | Freq: Every day | ORAL | Status: DC

## 2015-06-27 MED ORDER — ZOLPIDEM TARTRATE ER 12.5 MG PO TBCR: 12.5000 mg | EXTENDED_RELEASE_TABLET | Freq: Every evening | ORAL | Status: DC | PRN

## 2015-06-27 NOTE — Progress Notes (Signed)
Name: Robin Chan   MRN: [id number]    DOB: Jan 09, 1971   Date:06/27/2015       Progress Note  Subjective  Chief Complaint  Chief Complaint  Patient presents with  . Medication Refill    follow-up  . ADHD  . Depression  . Insomnia  . Allergic Rhinitis     HPI  AR; she has been taking all her medication, symptoms have been stable, she has a follow up with ENT before the end of the year. She has daily nasal congestion and occasional rhinorrhea.   [id number] medication daily as prescribed, on Adderal XR 15 mg, still not as good as taking Vyvanse. However able to have more energy and focus on medication  MS: diagnosed in 2000, but first symptoms in 1991 - she felt numb on the right side and weakness.  Currently off medication because of multiple side effects. Has intermittent weakness and has to use a cane at times. Doing well at this time  Insomnia: still taking Ambien and is doing well, unable to sleep without it. No side effects, such as amnesia. Medication helps her fall and stay asleep  Major Depression: taking Lexapro, and was doing well, however feeling down since her 42 yo son spend the holidays at home but went back to school on Monday. Crying spells, more fatigued since.  Patient Active Problem List   Diagnosis Date Noted  . History of hysterectomy for benign disease 05/26/2015  . Left lumbar radiculitis 03/28/2015  . Breast cancer screening 02/05/2015  . Abnormal CAT scan 12/31/2014  . ADD (attention deficit disorder) 12/31/2014  . Allergic to bees 12/31/2014  . Back muscle spasm 12/31/2014  . Insomnia, persistent 12/31/2014  . CN (constipation) 12/31/2014  . Depression, major, recurrent, mild ([place]) 12/31/2014  . Personal history of traumatic fracture 12/31/2014  . Asthma, mild intermittent 12/31/2014  . Migraine with aura and without status migrainosus, not intractable 12/31/2014  . Multiple sclerosis, relapsing-remitting ([place]) 12/31/2014  . Adnexal pain  12/31/2014  . Perennial allergic rhinitis 12/31/2014  . Pituitary microadenoma ([place]) 12/31/2014  . Vitamin D deficiency 12/31/2014  . Acquired trigger finger 12/31/2014    Past Surgical History  Procedure Laterality Date  . Ovary surgery Left 09/30/2014  . Breast biopsy Right 2005    benign  . Laparoscopic assisted vaginal hysterectomy N/A 05/26/2015    Procedure: LAPAROSCOPIC ASSISTED VAGINAL HYSTERECTOMY, with right salpingectomy;  Surgeon: Brayton Mars, MD;  Location: [facility] ORS;  Service: Gynecology;  Laterality: N/A;  . Rectocele repair N/A 05/26/2015    Procedure: POSTERIOR REPAIR (RECTOCELE);  Surgeon: Brayton Mars, MD;  Location: [facility] ORS;  Service: Gynecology;  Laterality: N/A;    Family History  Problem Relation Age of Onset  . Anemia Mother   . Osteoporosis Mother   . Mental illness Father     [place]  . Arthritis Brother   . Breast cancer Maternal Aunt     Social History   Social History  . Marital Status: Married    Spouse Name: N/A  . Number of Children: N/A  . Years of Education: N/A   Occupational History  . Not on file.   Social History Main Topics  . Smoking status: Never Smoker   . Smokeless tobacco: Never Used  . Alcohol Use: No  . Drug Use: No  . Sexual Activity: Not Currently   Other Topics Concern  . Not on file   Social History Narrative     Current outpatient prescriptions:  .  albuterol (PROVENTIL HFA;VENTOLIN HFA) 108 (90 BASE) MCG/ACT inhaler, Inhale 1 puff into the lungs every 6 (six) hours as needed for wheezing or shortness of breath., Disp: , Rfl:  .  amphetamine-dextroamphetamine (ADDERALL XR) 15 MG 24 hr capsule, Take 1 capsule by mouth daily., Disp: 90 capsule, Rfl: 0 .  cetirizine (ZYRTEC) 10 MG tablet, Take 10 mg by mouth daily., Disp: , Rfl:  .  Cholecalciferol (VITAMIN D3) 1000 UNITS CHEW, Chew 2,000 Units by mouth daily. , Disp: , Rfl:  .  cyclobenzaprine (FLEXERIL) 5 MG tablet, Take 1 tablet (5 mg total)  by mouth 3 (three) times daily as needed for muscle spasms., Disp: 90 tablet, Rfl: 1 .  docusate sodium (COLACE) 100 MG capsule, Take 1 capsule (100 mg total) by mouth 2 (two) times daily., Disp: 10 capsule, Rfl: 0 .  EPINEPHrine 0.3 mg/0.3 mL IJ SOAJ injection, Inject 0.3 mg into the muscle once., Disp: , Rfl:  .  escitalopram (LEXAPRO) 20 MG tablet, Take 1 tablet by mouth  daily, Disp: 90 tablet, Rfl: 1 .  fluticasone (FLONASE) 50 MCG/ACT nasal spray, Place 2 sprays into both nostrils daily., Disp: 48 g, Rfl: 1 .  ibuprofen (ADVIL,MOTRIN) 800 MG tablet, Take 1 tablet (800 mg total) by mouth 3 (three) times daily., Disp: 50 tablet, Rfl: 1 .  levocetirizine (XYZAL) 5 MG tablet, Take 1 tablet (5 mg total) by mouth every evening., Disp: 90 tablet, Rfl: 4 .  Methen-Hyosc-Meth Blue-Na Phos (UROGESIC-BLUE) 81.6 MG TABS, Take 1 tablet (81.6 mg total) by mouth every 4 (four) hours as needed., Disp: 8 tablet, Rfl: 0 .  montelukast (SINGULAIR) 10 MG tablet, Take 1 tablet (10 mg total) by mouth at bedtime., Disp: 90 tablet, Rfl: 1 .  Multiple Vitamins-Minerals (MULTIVITAMIN ADULT PO), Take by mouth., Disp: , Rfl:  .  Omega-3 Fatty Acids (FISH OIL) 1000 MG CAPS, Take by mouth., Disp: , Rfl:  .  Probiotic Product (PROBIOTIC DAILY PO), Take by mouth., Disp: , Rfl:  .  zolpidem (AMBIEN CR) 12.5 MG CR tablet, Take 1 tablet (12.5 mg total) by mouth at bedtime as needed for sleep., Disp: 90 tablet, Rfl: 1  Allergies  Allergen Reactions  . Penicillins Anaphylaxis  . Bee Venom     Bee     ROS  Constitutional: Negative for fever or significant weight change.  Respiratory: Negative for cough and shortness of breath.   Cardiovascular: Negative for chest pain or palpitations.  Gastrointestinal: Some abdominal pain from recent hysterectomy, no bowel changes.  Musculoskeletal: Negative for gait problem or joint swelling.  Skin: Negative for rash.  Neurological: Negative for dizziness or headache.  No other  specific complaints in a complete review of systems (except as listed in HPI above).   Objective  Filed Vitals:   06/27/15 1132  BP: 116/80  Pulse: 96  Temp: 98.8 F (37.1 C)  TempSrc: Oral  Resp: 14  Height: 5\' 9"  (1.753 m)  Weight: 189 lb 9.6 oz (86.002 kg)  SpO2: 97%    Body mass index is 27.99 kg/(m^2).  Physical Exam  Constitutional: Patient appears well-developed and well-nourished. No distress.  HEENT: head atraumatic, normocephalic, pupils equal and reactive to light, ears TM, neck supple, throat within normal limits Cardiovascular: Normal rate, regular rhythm and normal heart sounds. No murmur heard. No BLE edema. Pulmonary/Chest: Effort normal and breath sounds normal. No respiratory distress. Abdominal: Soft. There is no tenderness. Psychiatric: Patient has a normal mood and affect. behavior is normal. Judgment and  thought content normal. Muscular Skeletal: tender to palpation of left lower back, negative straight leg raise today  Recent Results (from the past 2160 hour(s))  CBC WITH DIFFERENTIAL     Status: Abnormal   Collection Time: 05/20/15 11:41 AM  Result Value Ref Range   WBC 5.8 3.6 - 11.0 K/uL   RBC 4.08 3.80 - 5.20 MIL/uL   Hemoglobin 11.9 (L) 12.0 - 16.0 g/dL   HCT 36.6 35.0 - 47.0 %   MCV 89.7 80.0 - 100.0 fL   MCH 29.2 26.0 - 34.0 pg   MCHC 32.6 32.0 - 36.0 g/dL   RDW 13.7 11.5 - 14.5 %   Platelets 341 150 - 440 K/uL   Neutrophils Relative % 65 %   Neutro Abs 3.8 1.4 - 6.5 K/uL   Lymphocytes Relative 28 %   Lymphs Abs 1.6 1.0 - 3.6 K/uL   Monocytes Relative 7 %   Monocytes Absolute 0.4 0.2 - 0.9 K/uL   Eosinophils Relative 1 %   Eosinophils Absolute 0.0 0 - 0.7 K/uL   Basophils Relative 1 %   Basophils Absolute 0.0 0 - 0.1 K/uL  Rapid HIV screen (HIV 1/2 Ab+Ag)     Status: None   Collection Time: 05/20/15 11:41 AM  Result Value Ref Range   HIV-1 P24 Antigen - HIV24 NON REACTIVE NON REACTIVE   HIV 1/2 Antibodies NON REACTIVE NON  REACTIVE   Interpretation (HIV Ag Ab)      A non reactive test result means that HIV 1 or HIV 2 antibodies and HIV 1 p24 antigen were not detected in the specimen.  RPR     Status: None   Collection Time: 05/20/15 11:41 AM  Result Value Ref Range   RPR Ser Ql Non Reactive Non Reactive    Comment: (NOTE) Performed At: [facility] [place], [place] [id number] Lindon Romp MD [id number]   Type and screen Type and Screen on C/S only     Status: None   Collection Time: 05/20/15 11:41 AM  Result Value Ref Range   ABO/RH(D) O POS    Antibody Screen NEG    Sample Expiration 06/03/2015    Extend sample reason NO TRANSFUSIONS OR PREGNANCY IN THE PAST 3 MONTHS   ABO/Rh     Status: None   Collection Time: 05/20/15 11:43 AM  Result Value Ref Range   ABO/RH(D) O POS   Pregnancy, urine POC     Status: None   Collection Time: 05/26/15  6:47 AM  Result Value Ref Range   Preg Test, Ur NEGATIVE NEGATIVE    Comment:        THE SENSITIVITY OF THIS METHODOLOGY IS >24 mIU/mL   Surgical pathology     Status: None   Collection Time: 05/26/15  8:41 AM  Result Value Ref Range   SURGICAL PATHOLOGY      Surgical Pathology CASE: [id number] PATIENT: Leafy Half Surgical Pathology Report     SPECIMEN SUBMITTED: A. Uterus with cervix, right tube  CLINICAL HISTORY: None provided  PRE-OPERATIVE DIAGNOSIS: Endometriosis, menorrhagia, rectocele  POST-OPERATIVE DIAGNOSIS: Same as pre-op      DIAGNOSIS: A. UTERUS WITH CERVIX AND RIGHT FALLOPIAN TUBE; HYSTERECTOMY WITH RIGHT SALPINGECTOMY: - CERVIX WITH FOCAL ACUTE CERVICITIS. - PROLIFERATIVE ENDOMETRIUM AND BENIGN ENDOMETRIAL POLYP (0.6 CM). - ADENOMYOSIS AND LEIOMYOMATA (UP TO 1.6 CM; WITHOUT ATYPIA, NECROSIS OR INCREASED MITOSES). - FALLOPIAN TUBE WITH FOCAL SALPINGITIS AND FOCAL NODULE OF FAT NECROSIS.   GROSS DESCRIPTION:  A.  The specimen is received in a formalin-filled container labeled  with the patient's name and uterus the cervix, right tube.  Adnexa: right tube unattached Weight: 188 grams Dimensions:      Fundus -6.5 x 5.7 x 5.3 cm      Cervix -4.1 x 4.3 cm Serosa: pink to tan subseros al nodule up to 0.9 cm Cervix: smooth pink-tan Endocervix: trabecular tan Endometrial cavity:      Dimensions -3.5 x 3.1 cm      Thickness -0.1 cm      Other findings -0.6 x 0.6 x 0.2 cm polyp Myometrium:     Thickness -2.5 cm     Other findings -multiple white whorled nodules to 1.6 cm in greatest dimension Adnexa:       Right fallopian tube           Measurements -5.3 cm in length x 0.9 cm in diameter           Other findings -none noted  Other comments: uterus could not be oriented  Block summary: 1-2-representative both sides cervix 3-4-representative both sides lower uterine segment 5-6-representative both sides endomyometrium 7-entire endometrial polyp 8-9-representative white whorled nodules 10-cross-sections and longitudinal fimbriated end right fallopian tube   Final Diagnosis performed by Delorse Lek, MD.  Electronically signed 05/27/2015 5:01:36PM    The electronic signature indicates that the named Attending Pathologist has evaluated the spec imen  Technical component performed at [facility], [address] [place], [place] [address] Lab: [phone] Dir: Darrick Penna. Evette Doffing, MD  Professional component performed at [facility], [facility], [place], [place], [place] [address] Lab: [phone] Dir: Dellia Nims. Rubinas, MD    Hemoglobin     Status: Abnormal   Collection Time: 05/27/15  7:18 AM  Result Value Ref Range   Hemoglobin 9.6 (L) 12.0 - 16.0 g/dL  Urine culture     Status: None   Collection Time: 06/09/15 12:00 AM  Result Value Ref Range   Urine Culture, Routine Final report    Urine Culture result 1 No growth   POCT urinalysis dipstick     Status: Abnormal   Collection Time: 06/09/15 10:11 AM  Result Value Ref Range    Color, UA yellow    Clarity, UA clear    Glucose, UA neg    Bilirubin, UA neg    Ketones, UA neg    Spec Grav, UA <=1.005    Blood, UA trace non hem    pH, UA 6.0    Protein, UA neg    Urobilinogen, UA 0.2    Nitrite, UA neg    Leukocytes, UA Trace (A) Negative     PHQ2/9: Depression screen [facility] 2/9 06/27/2015 03/28/2015 12/31/2014  Decreased Interest 0 0 1  Down, Depressed, Hopeless 1 0 -  PHQ - 2 Score 1 0 1  Altered sleeping - - 3  Tired, decreased energy - - 2  Change in appetite - - 0  Feeling bad or failure about yourself  - - 0  Trouble concentrating - - 0  Moving slowly or fidgety/restless - - 0  Suicidal thoughts - - 0  PHQ-9 Score - - 6  Difficult doing work/chores - - Somewhat difficult    Fall Risk: Fall Risk  06/27/2015 03/28/2015 12/31/2014  Falls in the past year? No No No    Functional Status Survey: Is the patient deaf or have difficulty hearing?: No Does the patient have difficulty seeing, even when wearing glasses/contacts?: Yes (glases) Does the  patient have difficulty concentrating, remembering, or making decisions?: No Does the patient have difficulty walking or climbing stairs?: No Does the patient have difficulty dressing or bathing?: No Does the patient have difficulty doing errands alone such as visiting a doctor's office or shopping?: No    Assessment & Plan  1. ADD (attention deficit disorder)  - amphetamine-dextroamphetamine (ADDERALL XR) 15 MG 24 hr capsule; Take 1 capsule by mouth daily.  Dispense: 90 capsule; Refill: 0  2. Multiple sclerosis, relapsing-remitting ([facility])  - amphetamine-dextroamphetamine (ADDERALL XR) 15 MG 24 hr capsule; Take 1 capsule by mouth daily.  Dispense: 90 capsule; Refill: 0  3. Insomnia, persistent  - zolpidem (AMBIEN CR) 12.5 MG CR tablet; Take 1 tablet (12.5 mg total) by mouth at bedtime as needed for sleep.  Dispense: 90 tablet; Refill: 1  4. Perennial allergic rhinitis with seasonal variation  - fluticasone  (FLONASE) 50 MCG/ACT nasal spray; Place 2 sprays into both nostrils daily.  Dispense: 48 g; Refill: 1  5. Muscle spasm of back  - cyclobenzaprine (FLEXERIL) 5 MG tablet; Take 1 tablet (5 mg total) by mouth 3 (three) times daily as needed for muscle spasms.  Dispense: 90 tablet; Refill: 1  6. Depression, major, recurrent, mild (HCC)  - amphetamine-dextroamphetamine (ADDERALL XR) 15 MG 24 hr capsule; Take 1 capsule by mouth daily.  Dispense: 90 capsule; Refill: 0

## 2015-07-08 ENCOUNTER — Encounter: Payer: Self-pay | Admitting: Obstetrics and Gynecology

## 2015-07-08 ENCOUNTER — Ambulatory Visit (INDEPENDENT_AMBULATORY_CARE_PROVIDER_SITE_OTHER): Payer: 59 | Admitting: Obstetrics and Gynecology

## 2015-07-08 VITALS — BP 127/78 | HR 86 | Ht 69.0 in | Wt 189.5 lb

## 2015-07-08 DIAGNOSIS — N809 Endometriosis, unspecified: Secondary | ICD-10-CM

## 2015-07-08 DIAGNOSIS — Z09 Encounter for follow-up examination after completed treatment for conditions other than malignant neoplasm: Secondary | ICD-10-CM

## 2015-07-08 DIAGNOSIS — Z90710 Acquired absence of both cervix and uterus: Secondary | ICD-10-CM | POA: Insufficient documentation

## 2015-07-08 DIAGNOSIS — N816 Rectocele: Secondary | ICD-10-CM | POA: Insufficient documentation

## 2015-07-08 NOTE — Progress Notes (Signed)
Chief complaint: 1.  Final postop check. 2.  Status post LAVH, right salpingectomy. 3.  Status post posterior colporrhaphy.  Patient is doing well 6 weeks postop.  Bowel and bladder function are normal.  She is not having any significant pelvic pain.  However, the patient is noting some cyclic discomfort with bowel function.  OBJECTIVE: BP 127/78 mmHg  Pulse 86  Ht 5\' 9"  (1.753 m)  Wt 189 lb 8 oz (85.957 kg)  BMI 27.97 kg/m2  LMP 05/19/2015 Pleasant, well-appearing female in no acute distress Abdomen: Soft, nontender, without organomegaly; laparoscopy incisions well approximated without evidence of hernia. Pelvic: External genitalia-normal BUS-normal. Vagina-excellent vault support; posterior repair suture line is intact. Bimanual-cuff, nontender and without masses. Rectovaginal-normal sphincter tone,; good posterior shelf.  ASSESSMENT: 1.  Six-week postop check-normal. 2.  Status post LAVH, right salpingectomy. 3.  Status post Posterior colporrhaphy. 4.  Endometriosis; with cyclic bowel dysfunction.  PLAN: 1.  Resume all activities without restriction. 2.  Monitor bowel symptomatology. 3.  Return in 6 months for follow-up.  Brayton Mars, MD  Note: This dictation was prepared with Dragon dictation along with smaller phrase technology. Any transcriptional errors that result from this process are unintentional.

## 2015-07-08 NOTE — Patient Instructions (Signed)
1.  Resume all activities without restriction. 2.  Monitor bowel function symptoms over the next 6 months. 3.  Return in 6 months for follow-up on endometriosis and rectocele.

## 2015-09-26 ENCOUNTER — Encounter: Payer: Self-pay | Admitting: Family Medicine

## 2015-09-26 ENCOUNTER — Ambulatory Visit (INDEPENDENT_AMBULATORY_CARE_PROVIDER_SITE_OTHER): Payer: 59 | Admitting: Family Medicine

## 2015-09-26 VITALS — BP 102/58 | HR 88 | Temp 97.7°F | Resp 16 | Ht 69.0 in | Wt 191.9 lb

## 2015-09-26 DIAGNOSIS — M5416 Radiculopathy, lumbar region: Secondary | ICD-10-CM

## 2015-09-26 DIAGNOSIS — J309 Allergic rhinitis, unspecified: Secondary | ICD-10-CM | POA: Diagnosis not present

## 2015-09-26 DIAGNOSIS — G4700 Insomnia, unspecified: Secondary | ICD-10-CM | POA: Diagnosis not present

## 2015-09-26 DIAGNOSIS — J3089 Other allergic rhinitis: Secondary | ICD-10-CM

## 2015-09-26 DIAGNOSIS — F330 Major depressive disorder, recurrent, mild: Secondary | ICD-10-CM | POA: Diagnosis not present

## 2015-09-26 DIAGNOSIS — F988 Other specified behavioral and emotional disorders with onset usually occurring in childhood and adolescence: Secondary | ICD-10-CM

## 2015-09-26 DIAGNOSIS — G35 Multiple sclerosis: Secondary | ICD-10-CM | POA: Diagnosis not present

## 2015-09-26 DIAGNOSIS — F909 Attention-deficit hyperactivity disorder, unspecified type: Secondary | ICD-10-CM | POA: Diagnosis not present

## 2015-09-26 DIAGNOSIS — J302 Other seasonal allergic rhinitis: Secondary | ICD-10-CM

## 2015-09-26 MED ORDER — AMPHETAMINE-DEXTROAMPHET ER 20 MG PO CP24: 20.0000 mg | ORAL_CAPSULE | ORAL | Status: DC

## 2015-09-26 NOTE — Progress Notes (Signed)
Name: Robin Chan   MRN: [id number]    DOB: 28-May-1971   Date:09/26/2015       Progress Note  Subjective  Chief Complaint  Chief Complaint  Patient presents with  . ADD    patient is here for her 32-month f/u. patient was to increase her adderall to 20. patient stated that she has had increased appetite, more moody and has gained weight.  . Back Pain    patient stated that it has aggravated her sciatica and has increased sx with her "drop foot"  . Medication Refill    HPI  AR; she has been taking all her medication, symptoms have gotten worse with the weather changes, she is back on Singulair , Zyrtec and Xyzal and it causes more fatigue and sedation.  She has daily nasal congestion,  Rhinorrhea and mild sneezing.   [id number] medication daily as prescribed, on Adderal XR 15 mg, still not as good as taking Vyvanse, and feeling more tired since she has been back on all her allergy medications, she would like to try higher dose of Adderal . We will send Adderal XR 20 mg to her pharmacy.  MS: diagnosed in 2000, but first symptoms in 1991 - she felt numb on the right side and weakness. Currently off medication because of multiple side effects. Has intermittent weakness and has to use a cane at times. Left foot drop worse recently   Insomnia: still taking Ambien and is doing well, unable to sleep without it. No side effects, such as amnesia. Medication helps her fall and stay asleep  Major Depression: taking Lexapro, and was doing well, still misses her son that is in [place] going to school. No longer having daily rying spells, more fatigued since allergy season started.  Low back pain: she states worse with abrupt change on temperature. Causing left lower back spasms, at times radiculitis down left leg, her left foot drop from MS has gotten worse recently also. Discussed Elavil, gabapentin, but she wants to hold off . She is taking Ibuprofen prn   Patient Active Problem List   Diagnosis Date Noted  . Rectocele 07/08/2015  . Status post laparoscopic assisted vaginal hysterectomy (LAVH) 07/08/2015  . History of hysterectomy for benign disease 05/26/2015  . Left lumbar radiculitis 03/28/2015  . Breast cancer screening 02/05/2015  . Abnormal CAT scan 12/31/2014  . ADD (attention deficit disorder) 12/31/2014  . Allergic to bees 12/31/2014  . Back muscle spasm 12/31/2014  . Insomnia, persistent 12/31/2014  . CN (constipation) 12/31/2014  . Depression, major, recurrent, mild ([place]) 12/31/2014  . Personal history of traumatic fracture 12/31/2014  . Asthma, mild intermittent 12/31/2014  . Migraine with aura and without status migrainosus, not intractable 12/31/2014  . Multiple sclerosis, relapsing-remitting ([place]) 12/31/2014  . Endometriosis 12/31/2014  . Perennial allergic rhinitis 12/31/2014  . Pituitary microadenoma ([place]) 12/31/2014  . Vitamin D deficiency 12/31/2014  . Acquired trigger finger 12/31/2014    Past Surgical History  Procedure Laterality Date  . Ovary surgery Left 09/30/2014  . Breast biopsy Right 2005    benign  . Laparoscopic assisted vaginal hysterectomy N/A 05/26/2015    Procedure: LAPAROSCOPIC ASSISTED VAGINAL HYSTERECTOMY, with right salpingectomy;  Surgeon: Brayton Mars, MD;  Location: [facility] ORS;  Service: Gynecology;  Laterality: N/A;  . Rectocele repair N/A 05/26/2015    Procedure: POSTERIOR REPAIR (RECTOCELE);  Surgeon: Brayton Mars, MD;  Location: [facility] ORS;  Service: Gynecology;  Laterality: N/A;    Family History  Problem Relation  Age of Onset  . Anemia Mother   . Osteoporosis Mother   . Mental illness Father     Bipolor  . Arthritis Brother   . Breast cancer Maternal Aunt     Social History   Social History  . Marital Status: Married    Spouse Name: N/A  . Number of Children: N/A  . Years of Education: N/A   Occupational History  . Not on file.   Social History Main Topics  . Smoking status: Never  Smoker   . Smokeless tobacco: Never Used  . Alcohol Use: No  . Drug Use: No  . Sexual Activity: Not Currently   Other Topics Concern  . Not on file   Social History Narrative     Current outpatient prescriptions:  .  amphetamine-dextroamphetamine (ADDERALL XR) 20 MG 24 hr capsule, Take 1 capsule (20 mg total) by mouth every morning., Disp: 90 capsule, Rfl: 0 .  cetirizine (ZYRTEC) 10 MG tablet, Take 10 mg by mouth daily., Disp: , Rfl:  .  Cholecalciferol (VITAMIN D3) 1000 UNITS CHEW, Chew 2,000 Units by mouth daily. , Disp: , Rfl:  .  cyclobenzaprine (FLEXERIL) 5 MG tablet, Take 1 tablet (5 mg total) by mouth 3 (three) times daily as needed for muscle spasms., Disp: 90 tablet, Rfl: 1 .  docusate sodium (COLACE) 100 MG capsule, Take 1 capsule (100 mg total) by mouth 2 (two) times daily., Disp: 10 capsule, Rfl: 0 .  escitalopram (LEXAPRO) 20 MG tablet, Take 1 tablet by mouth  daily, Disp: 90 tablet, Rfl: 1 .  fluticasone (FLONASE) 50 MCG/ACT nasal spray, Place 2 sprays into both nostrils daily., Disp: 48 g, Rfl: 1 .  levocetirizine (XYZAL) 5 MG tablet, Take 1 tablet (5 mg total) by mouth every evening., Disp: 90 tablet, Rfl: 4 .  montelukast (SINGULAIR) 10 MG tablet, Take 1 tablet (10 mg total) by mouth at bedtime., Disp: 90 tablet, Rfl: 1 .  Multiple Vitamins-Minerals (MULTIVITAMIN ADULT PO), Take by mouth., Disp: , Rfl:  .  Omega-3 Fatty Acids (FISH OIL) 1000 MG CAPS, Take by mouth., Disp: , Rfl:  .  Probiotic Product (PROBIOTIC DAILY PO), Take by mouth., Disp: , Rfl:  .  zolpidem (AMBIEN CR) 12.5 MG CR tablet, Take 1 tablet (12.5 mg total) by mouth at bedtime as needed for sleep., Disp: 90 tablet, Rfl: 1 .  albuterol (PROVENTIL HFA;VENTOLIN HFA) 108 (90 BASE) MCG/ACT inhaler, Inhale 1 puff into the lungs every 6 (six) hours as needed for wheezing or shortness of breath. Reported on 09/26/2015, Disp: , Rfl:  .  EPINEPHrine 0.3 mg/0.3 mL IJ SOAJ injection, Inject 0.3 mg into the muscle once.  Reported on 09/26/2015, Disp: , Rfl:  .  ibuprofen (ADVIL,MOTRIN) 800 MG tablet, Take 1 tablet (800 mg total) by mouth 3 (three) times daily. (Patient not taking: Reported on 09/26/2015), Disp: 50 tablet, Rfl: 1  Allergies  Allergen Reactions  . Penicillins Anaphylaxis  . Bee Venom     Bee     ROS  Constitutional: Negative for fever or weight change.  Respiratory: Negative for cough and shortness of breath.   Cardiovascular: Negative for chest pain or palpitations.  Gastrointestinal: Negative for abdominal pain, no bowel changes.  Musculoskeletal: Positive  for gait problem - foot drop - or joint swelling.  Skin: Negative for rash.  Neurological: Negative for dizziness or headache.  No other specific complaints in a complete review of systems (except as listed in HPI above).  Objective  Filed Vitals:   09/26/15 0949  BP: 102/58  Pulse: 88  Temp: 97.7 F (36.5 C)  TempSrc: Oral  Resp: 16  Height: 5\' 9"  (1.753 m)  Weight: 191 lb 14.4 oz (87.045 kg)  SpO2: 99%    Body mass index is 28.33 kg/(m^2).  Physical Exam  Constitutional: Patient appears well-developed and well-nourished.  No distress.  HEENT: head atraumatic, normocephalic, pupils equal and reactive to light, neck supple, throat within normal limits Cardiovascular: Normal rate, regular rhythm and normal heart sounds.  No murmur heard. No BLE edema. Pulmonary/Chest: Effort normal and breath sounds normal. No respiratory distress. Abdominal: Soft.  There is no tenderness. Psychiatric: Patient has a normal mood and affect. behavior is normal. Judgment and thought content normal. Muscular Skeletal: left foot drop, pain and spasms on left lower back, negative straight leg raise, normal sensation on lower extremities.    PHQ2/9: Depression screen [facility] 2/9 09/26/2015 06/27/2015 03/28/2015 12/31/2014  Decreased Interest 0 0 0 1  Down, Depressed, Hopeless 0 1 0 -  PHQ - 2 Score 0 1 0 1  Altered sleeping - - - 3  Tired,  decreased energy - - - 2  Change in appetite - - - 0  Feeling bad or failure about yourself  - - - 0  Trouble concentrating - - - 0  Moving slowly or fidgety/restless - - - 0  Suicidal thoughts - - - 0  PHQ-9 Score - - - 6  Difficult doing work/chores - - - Somewhat difficult    Fall Risk: Fall Risk  09/26/2015 06/27/2015 03/28/2015 12/31/2014  Falls in the past year? No No No No     Functional Status Survey: Is the patient deaf or have difficulty hearing?: No Does the patient have difficulty seeing, even when wearing glasses/contacts?: No Does the patient have difficulty concentrating, remembering, or making decisions?: No Does the patient have difficulty walking or climbing stairs?: Yes (due tp back pain and drop foot) Does the patient have difficulty dressing or bathing?: No Does the patient have difficulty doing errands alone such as visiting a doctor's office or shopping?: No    Assessment & Plan  1. Multiple sclerosis, relapsing-remitting (HCC)  - amphetamine-dextroamphetamine (ADDERALL XR) 20 MG 24 hr capsule; Take 1 capsule (20 mg total) by mouth every morning.  Dispense: 90 capsule; Refill: 0  2. ADD (attention deficit disorder)  - amphetamine-dextroamphetamine (ADDERALL XR) 20 MG 24 hr capsule; Take 1 capsule (20 mg total) by mouth every morning.  Dispense: 90 capsule; Refill: 0  3. Depression, major, recurrent, mild (HCC)  - amphetamine-dextroamphetamine (ADDERALL XR) 20 MG 24 hr capsule; Take 1 capsule (20 mg total) by mouth every morning.  Dispense: 90 capsule; Refill: 0  4. Insomnia, persistent  Discussed turning off all lights and electronics to re-set biological clock. Continue Ambien  5. Perennial allergic rhinitis with seasonal variation  Continue medication

## 2015-11-18 ENCOUNTER — Other Ambulatory Visit: Payer: Self-pay | Admitting: Family Medicine

## 2015-12-30 ENCOUNTER — Encounter: Payer: Self-pay | Admitting: Family Medicine

## 2015-12-30 ENCOUNTER — Ambulatory Visit (INDEPENDENT_AMBULATORY_CARE_PROVIDER_SITE_OTHER): Payer: 59 | Admitting: Family Medicine

## 2015-12-30 VITALS — BP 102/64 | HR 96 | Temp 97.9°F | Resp 16 | Ht 69.0 in | Wt 193.9 lb

## 2015-12-30 DIAGNOSIS — J4520 Mild intermittent asthma, uncomplicated: Secondary | ICD-10-CM | POA: Diagnosis not present

## 2015-12-30 DIAGNOSIS — M62830 Muscle spasm of back: Secondary | ICD-10-CM

## 2015-12-30 DIAGNOSIS — G35 Multiple sclerosis: Secondary | ICD-10-CM | POA: Diagnosis not present

## 2015-12-30 DIAGNOSIS — F909 Attention-deficit hyperactivity disorder, unspecified type: Secondary | ICD-10-CM | POA: Diagnosis not present

## 2015-12-30 DIAGNOSIS — J3089 Other allergic rhinitis: Secondary | ICD-10-CM

## 2015-12-30 DIAGNOSIS — G4700 Insomnia, unspecified: Secondary | ICD-10-CM

## 2015-12-30 DIAGNOSIS — F330 Major depressive disorder, recurrent, mild: Secondary | ICD-10-CM

## 2015-12-30 DIAGNOSIS — F988 Other specified behavioral and emotional disorders with onset usually occurring in childhood and adolescence: Secondary | ICD-10-CM

## 2015-12-30 MED ORDER — AMPHETAMINE-DEXTROAMPHET ER 20 MG PO CP24: 20.0000 mg | ORAL_CAPSULE | ORAL | Status: DC

## 2015-12-30 MED ORDER — MONTELUKAST SODIUM 10 MG PO TABS: 10.0000 mg | ORAL_TABLET | Freq: Every day | ORAL | Status: DC

## 2015-12-30 MED ORDER — DULOXETINE HCL 30 MG PO CPEP: 30.0000 mg | ORAL_CAPSULE | Freq: Every day | ORAL | Status: DC

## 2015-12-30 MED ORDER — ZOLPIDEM TARTRATE ER 12.5 MG PO TBCR: 12.5000 mg | EXTENDED_RELEASE_TABLET | Freq: Every evening | ORAL | Status: DC | PRN

## 2015-12-30 MED ORDER — CYCLOBENZAPRINE HCL 5 MG PO TABS: 5.0000 mg | ORAL_TABLET | Freq: Three times a day (TID) | ORAL | Status: DC | PRN

## 2015-12-30 MED ORDER — LEVOCETIRIZINE DIHYDROCHLORIDE 5 MG PO TABS: 5.0000 mg | ORAL_TABLET | Freq: Every evening | ORAL | Status: DC

## 2015-12-30 NOTE — Progress Notes (Signed)
Name: Robin Chan   MRN: [id number]    DOB: [birth year]   Date:12/30/2015       Progress Note  Subjective  Chief Complaint  Chief Complaint  Patient presents with  . Depression    patient stated that she has had a lot happen recently.  . Insomnia  . Allergic Rhinitis     itchy ears with ringing, nasal congestion persists and lots of sneezing  . left lumbar radiculitis  . ADD  . multiple sclerosis    HPI   [id number] medication daily as prescribed, on Adderal XR 20 mg now and she states she has been feeling well with medication, no side effects. Focus not doing as well now because of depression is getting worse  MS: diagnosed in 2000, but first symptoms in 1991 - she felt numb on the right side and weakness. Currently off medication because of multiple side effects. Has intermittent weakness and has to use a cane at times. Adderall helps with energy  Insomnia: still taking Ambien and states that because of stress level being high sometimes unable to fall asleep right away.  No side effects, such as amnesia.   Major Depression: taking Lexapro, but she has been on medication for a long time and stress level has been higher. Her daughter is 9 yo and may have autism spectrum disorder. Best friend that lives in [place] lost her mother and she could not go see her. She was hoping to work on her marriage, but now plans have changed because daughter will have to live at home instead of living on campus. She will also have to get a part time job to help with bills. We will change from Lexapro to Cymbalta, she needs to resume counseling.   Low back pain: she states worse with abrupt change on temperature. Causing left lower back spasms, at times radiculitis down left leg, her left foot drop from MS has gotten worse recently also. Discussed Elavil, gabapentin, but she wants to hold off - worried bout side effects . She is taking Ibuprofen prn No changes, she still wants to stay off  medication  Bilateral ear pruritus: she states she had some tinnitus bilaterally a couple of weeks ago, that has resolved, but now it is itchy, right worse than left, no cold symptoms or ear pain.   Patient Active Problem List   Diagnosis Date Noted  . Rectocele 07/08/2015  . Status post laparoscopic assisted vaginal hysterectomy (LAVH) 07/08/2015  . History of hysterectomy for benign disease 05/26/2015  . Left lumbar radiculitis 03/28/2015  . Breast cancer screening 02/05/2015  . Abnormal CAT scan 12/31/2014  . ADD (attention deficit disorder) 12/31/2014  . Allergic to bees 12/31/2014  . Back muscle spasm 12/31/2014  . Insomnia, persistent 12/31/2014  . CN (constipation) 12/31/2014  . Depression, major, recurrent, mild ([place]) 12/31/2014  . Personal history of traumatic fracture 12/31/2014  . Asthma, mild intermittent 12/31/2014  . Migraine with aura and without status migrainosus, not intractable 12/31/2014  . Multiple sclerosis, relapsing-remitting ([place]) 12/31/2014  . Endometriosis 12/31/2014  . Perennial allergic rhinitis 12/31/2014  . Pituitary microadenoma ([place]) 12/31/2014  . Vitamin D deficiency 12/31/2014  . Acquired trigger finger 12/31/2014    Past Surgical History  Procedure Laterality Date  . Ovary surgery Left 09/30/2014  . Breast biopsy Right 2005    benign  . Laparoscopic assisted vaginal hysterectomy N/A 05/26/2015    Procedure: LAPAROSCOPIC ASSISTED VAGINAL HYSTERECTOMY, with right salpingectomy;  Surgeon: Brayton Mars, MD;  Location: [facility] ORS;  Service: Gynecology;  Laterality: N/A;  . Rectocele repair N/A 05/26/2015    Procedure: POSTERIOR REPAIR (RECTOCELE);  Surgeon: Brayton Mars, MD;  Location: [facility] ORS;  Service: Gynecology;  Laterality: N/A;    Family History  Problem Relation Age of Onset  . Anemia Mother   . Osteoporosis Mother   . Mental illness Father     Bipolor  . Arthritis Brother   . Breast cancer Maternal Aunt     Social  History   Social History  . Marital Status: Married    Spouse Name: N/A  . Number of Children: N/A  . Years of Education: N/A   Occupational History  . Not on file.   Social History Main Topics  . Smoking status: Never Smoker   . Smokeless tobacco: Never Used  . Alcohol Use: No  . Drug Use: No  . Sexual Activity: Not Currently   Other Topics Concern  . Not on file   Social History Narrative     Current outpatient prescriptions:  .  albuterol (PROVENTIL HFA;VENTOLIN HFA) 108 (90 BASE) MCG/ACT inhaler, Inhale 1 puff into the lungs every 6 (six) hours as needed for wheezing or shortness of breath. Reported on 09/26/2015, Disp: , Rfl:  .  amphetamine-dextroamphetamine (ADDERALL XR) 20 MG 24 hr capsule, Take 1 capsule (20 mg total) by mouth every morning., Disp: 90 capsule, Rfl: 0 .  cetirizine (ZYRTEC) 10 MG tablet, Take 10 mg by mouth daily., Disp: , Rfl:  .  Cholecalciferol (VITAMIN D3) 1000 UNITS CHEW, Chew 2,000 Units by mouth daily. , Disp: , Rfl:  .  cyclobenzaprine (FLEXERIL) 5 MG tablet, Take 1 tablet (5 mg total) by mouth 3 (three) times daily as needed for muscle spasms., Disp: 90 tablet, Rfl: 1 .  docusate sodium (COLACE) 100 MG capsule, Take 1 capsule (100 mg total) by mouth 2 (two) times daily., Disp: 10 capsule, Rfl: 0 .  DULoxetine (CYMBALTA) 30 MG capsule, Take 1-2 capsules (30-60 mg total) by mouth daily. First week one capsule after that 2 daily, Disp: 60 capsule, Rfl: 3 .  EPINEPHrine 0.3 mg/0.3 mL IJ SOAJ injection, Inject 0.3 mg into the muscle once. Reported on 09/26/2015, Disp: , Rfl:  .  fluticasone (FLONASE) 50 MCG/ACT nasal spray, Place 2 sprays into both nostrils daily., Disp: 48 g, Rfl: 1 .  ibuprofen (ADVIL,MOTRIN) 800 MG tablet, Take 1 tablet (800 mg total) by mouth 3 (three) times daily. (Patient not taking: Reported on 09/26/2015), Disp: 50 tablet, Rfl: 1 .  levocetirizine (XYZAL) 5 MG tablet, Take 1 tablet (5 mg total) by mouth every evening., Disp: 90  tablet, Rfl: 4 .  montelukast (SINGULAIR) 10 MG tablet, Take 1 tablet (10 mg total) by mouth at bedtime., Disp: 90 tablet, Rfl: 1 .  Multiple Vitamins-Minerals (MULTIVITAMIN ADULT PO), Take by mouth., Disp: , Rfl:  .  Omega-3 Fatty Acids (FISH OIL) 1000 MG CAPS, Take by mouth., Disp: , Rfl:  .  Probiotic Product (PROBIOTIC DAILY PO), Take by mouth., Disp: , Rfl:  .  zolpidem (AMBIEN CR) 12.5 MG CR tablet, Take 1 tablet (12.5 mg total) by mouth at bedtime as needed for sleep., Disp: 90 tablet, Rfl: 1  Allergies  Allergen Reactions  . Penicillins Anaphylaxis  . Bee Venom     Bee     ROS  Constitutional: Negative for fever or weight change.  Respiratory: Negative for cough and shortness of breath.   Cardiovascular: Negative for chest pain or  palpitations.  Gastrointestinal: Negative for abdominal pain, no bowel changes.  Musculoskeletal: Positive  for gait problem but no joint swelling.  Skin: Negative for rash.  Neurological: Negative for dizziness , she has intermittent headache.  No other specific complaints in a complete review of systems (except as listed in HPI above).  Objective  Filed Vitals:   12/30/15 1019  BP: 102/64  Pulse: 96  Temp: 97.9 F (36.6 C)  TempSrc: Oral  Resp: 16  Height: 5\' 9"  (1.753 m)  Weight: 193 lb 14.4 oz (87.952 kg)  SpO2: 97%    Body mass index is 28.62 kg/(m^2).  Physical Exam  Constitutional: Patient appears well-developed and well-nourished. No distress.  HEENT: head atraumatic, normocephalic, pupils equal and reactive to light, neck supple, TM normal exam bilaterally - normal external ear canal,  throat within normal limits Cardiovascular: Normal rate, regular rhythm and normal heart sounds. No murmur heard. No BLE edema. Pulmonary/Chest: Effort normal and breath sounds normal. No respiratory distress. Abdominal: Soft. There is no tenderness. Psychiatric: Patient has a normal mood and affect. behavior is normal. Judgment and  thought content normal. Muscular Skeletal: left foot drop, pain and spasms on left lower back, negative straight leg raise, normal sensation on lower extremities.   PHQ2/9: Depression screen [facility] 2/9 12/30/2015 09/26/2015 06/27/2015 03/28/2015 12/31/2014  Decreased Interest 2 0 0 0 1  Down, Depressed, Hopeless 3 0 1 0 -  PHQ - 2 Score 5 0 1 0 1  Altered sleeping 3 - - - 3  Tired, decreased energy 3 - - - 2  Change in appetite 1 - - - 0  Feeling bad or failure about yourself  1 - - - 0  Trouble concentrating 2 - - - 0  Moving slowly or fidgety/restless 0 - - - 0  Suicidal thoughts 0 - - - 0  PHQ-9 Score 15 - - - 6  Difficult doing work/chores - - - - Somewhat difficult     Fall Risk: Fall Risk  12/30/2015 09/26/2015 06/27/2015 03/28/2015 12/31/2014  Falls in the past year? No No No No No      Functional Status Survey: Is the patient deaf or have difficulty hearing?: No Does the patient have difficulty seeing, even when wearing glasses/contacts?: No Does the patient have difficulty concentrating, remembering, or making decisions?: Yes Does the patient have difficulty walking or climbing stairs?: Yes Does the patient have difficulty dressing or bathing?: No Does the patient have difficulty doing errands alone such as visiting a doctor's office or shopping?: No    Assessment & Plan  1. Multiple sclerosis, relapsing-remitting (HCC)  - amphetamine-dextroamphetamine (ADDERALL XR) 20 MG 24 hr capsule; Take 1 capsule (20 mg total) by mouth every morning.  Dispense: 90 capsule; Refill: 0  2. ADD (attention deficit disorder)  - amphetamine-dextroamphetamine (ADDERALL XR) 20 MG 24 hr capsule; Take 1 capsule (20 mg total) by mouth every morning.  Dispense: 90 capsule; Refill: 0  3. Depression, major, recurrent, mild (HCC)  - amphetamine-dextroamphetamine (ADDERALL XR) 20 MG 24 hr capsule; Take 1 capsule (20 mg total) by mouth every morning.  Dispense: 90 capsule; Refill: 0 - Ambulatory referral to  Psychology  4. Insomnia, persistent  - zolpidem (AMBIEN CR) 12.5 MG CR tablet; Take 1 tablet (12.5 mg total) by mouth at bedtime as needed for sleep.  Dispense: 90 tablet; Refill: 1  5. Asthma, mild intermittent, uncomplicated  Under control, no cough, wheezing or SOB  6. Other allergic rhinitis  - levocetirizine (  XYZAL) 5 MG tablet; Take 1 tablet (5 mg total) by mouth every evening.  Dispense: 90 tablet; Refill: 4 Normal ear exam, likely from allergies  7. Muscle spasm of back  - cyclobenzaprine (FLEXERIL) 5 MG tablet; Take 1 tablet (5 mg total) by mouth 3 (three) times daily as needed for muscle spasms.  Dispense: 90 tablet; Refill: 1

## 2016-01-06 ENCOUNTER — Ambulatory Visit: Payer: 59 | Admitting: Obstetrics and Gynecology

## 2016-01-21 ENCOUNTER — Ambulatory Visit (INDEPENDENT_AMBULATORY_CARE_PROVIDER_SITE_OTHER): Payer: 59 | Admitting: Obstetrics and Gynecology

## 2016-01-21 ENCOUNTER — Encounter: Payer: Self-pay | Admitting: Obstetrics and Gynecology

## 2016-01-21 VITALS — BP 129/79 | HR 92 | Ht 69.0 in | Wt 196.0 lb

## 2016-01-21 DIAGNOSIS — N816 Rectocele: Secondary | ICD-10-CM

## 2016-01-21 DIAGNOSIS — N809 Endometriosis, unspecified: Secondary | ICD-10-CM

## 2016-01-21 DIAGNOSIS — K5900 Constipation, unspecified: Secondary | ICD-10-CM | POA: Diagnosis not present

## 2016-01-21 NOTE — Patient Instructions (Signed)
1.  Return in 6 months for follow-up on endometriosis and rectal pain

## 2016-01-21 NOTE — Progress Notes (Signed)
Chief complaint : 1. History of endometriosis 2. history of chronic constipation and rectal pain , cyclic  3. Status post LAVH , right salpingectomy, posterior colporrhaphy   patient presents for 6 month interval follow-up after surgery.she reports feeling reasnaby well except for occasionalcic constiatio ad ecal pai anddiscomfort with bowel movements.  Robin Chan presents today for 6 month interval follow-up after LAVH bilateral salpingectomy and posterior colporrhaphy. She has history of endometriosis and has history of chronic constipation which improved following surgery. Most recently she has been having occasional constipation with rectal pain with defecation during bowel movements. These episodes of difficulty. Be cyclic in nature and may be associated with more cyclic endometriosis pain. She has not having any bloody stool or bright red blood per rectum. Bladder function is normal.  Past medical history, past surgical history, problem list, medications, and allergies are reviewed  OBJECTIVE: BP 129/79 mmHg  Pulse 92  Ht 5\' 9"  (1.753 m)  Wt 196 lb (88.905 kg)  BMI 28.93 kg/m2  LMP 05/19/2015 Pleasant female in no acute distress she is alert and oriented. Affect is appropriate. Abdomen: Soft, nontender, without organomegaly; laparoscopy incisions well approximated without evidence of hernia. Pelvic: External genitalia-normal BUS-normal. Vagina-excellent vault support; posterior repair suture line is intact. Bimanual-cuff, nontender and without masses. Rectovaginal-normal sphincter tone,; good posterior shelf. No rectal masses  ASSESSMENT: 1. History of endometriosis, minimally symptomatic 2. Cyclic constipation and rectal pain, possibly related to endometriosis, not incapacitating  PLAN: 1. Continue to monitor symptomatology. 2. If symptoms worsen, return for consideration of endometriosis medical management 3. Continue with water intake and stool softeners as needed 4. Return in  6 months for follow-up  A total of 15 minutes were spent face-to-face with the patient during this encounter and over half of that time dealt with counseling and coordination of care.  Brayton Mars, MD  Note: This dictation was prepared with Dragon dictation along with smaller phrase technology. Any transcriptional errors that result from this process are unintentional.

## 2016-02-04 ENCOUNTER — Ambulatory Visit: Payer: 59 | Admitting: Family Medicine

## 2016-02-06 ENCOUNTER — Encounter: Payer: 59 | Admitting: Family Medicine

## 2016-02-09 ENCOUNTER — Emergency Department
Admission: EM | Admit: 2016-02-09 | Discharge: 2016-02-09 | Disposition: A | Discharge status: Home / Self Care | Payer: 59 | Attending: Emergency Medicine | Admitting: Emergency Medicine

## 2016-02-09 ENCOUNTER — Encounter: Payer: Self-pay | Admitting: *Deleted

## 2016-02-09 DIAGNOSIS — Z79899 Other long term (current) drug therapy: Secondary | ICD-10-CM | POA: Diagnosis not present

## 2016-02-09 DIAGNOSIS — F909 Attention-deficit hyperactivity disorder, unspecified type: Secondary | ICD-10-CM | POA: Insufficient documentation

## 2016-02-09 DIAGNOSIS — M545 Low back pain: Secondary | ICD-10-CM | POA: Diagnosis present

## 2016-02-09 DIAGNOSIS — Y999 Unspecified external cause status: Secondary | ICD-10-CM | POA: Diagnosis not present

## 2016-02-09 DIAGNOSIS — Y9389 Activity, other specified: Secondary | ICD-10-CM | POA: Insufficient documentation

## 2016-02-09 DIAGNOSIS — Z7951 Long term (current) use of inhaled steroids: Secondary | ICD-10-CM | POA: Diagnosis not present

## 2016-02-09 DIAGNOSIS — J4520 Mild intermittent asthma, uncomplicated: Secondary | ICD-10-CM | POA: Diagnosis not present

## 2016-02-09 DIAGNOSIS — Z791 Long term (current) use of non-steroidal anti-inflammatories (NSAID): Secondary | ICD-10-CM | POA: Insufficient documentation

## 2016-02-09 DIAGNOSIS — S39012A Strain of muscle, fascia and tendon of lower back, initial encounter: Secondary | ICD-10-CM | POA: Diagnosis not present

## 2016-02-09 DIAGNOSIS — F330 Major depressive disorder, recurrent, mild: Secondary | ICD-10-CM | POA: Diagnosis not present

## 2016-02-09 DIAGNOSIS — Y929 Unspecified place or not applicable: Secondary | ICD-10-CM | POA: Diagnosis not present

## 2016-02-09 DIAGNOSIS — X509XXA Other and unspecified overexertion or strenuous movements or postures, initial encounter: Secondary | ICD-10-CM | POA: Insufficient documentation

## 2016-02-09 MED ORDER — KETOROLAC TROMETHAMINE 60 MG/2ML IM SOLN
60.0000 mg | Freq: Once | INTRAMUSCULAR | Status: AC
  Administered 2016-02-09: 60 mg via INTRAMUSCULAR
  Filled 2016-02-09: qty 2

## 2016-02-09 MED ORDER — NAPROXEN 500 MG PO TABS: 500.0000 mg | ORAL_TABLET | Freq: Two times a day (BID) | ORAL | Status: DC

## 2016-02-09 MED ORDER — DIAZEPAM 5 MG/ML IJ SOLN
10.0000 mg | Freq: Once | INTRAMUSCULAR | Status: AC
  Administered 2016-02-09: 10 mg via INTRAMUSCULAR
  Filled 2016-02-09: qty 2

## 2016-02-09 MED ORDER — CYCLOBENZAPRINE HCL 10 MG PO TABS: 10.0000 mg | ORAL_TABLET | Freq: Three times a day (TID) | ORAL | Status: DC | PRN

## 2016-02-09 MED ORDER — HYDROCODONE-ACETAMINOPHEN 5-325 MG PO TABS: 1.0000 | ORAL_TABLET | ORAL | Status: DC | PRN

## 2016-02-09 NOTE — ED Notes (Signed)
States she was moving some furniture  States she was moving left but right side did not move developed severe lower back pain which radiates into right leg  States she is unable to bear full wt and used walker for ambulation this am

## 2016-02-09 NOTE — ED Provider Notes (Signed)
[facility] Emergency Department Provider Note  ____________________________________________  Time seen: Approximately 9:46 AM  I have reviewed the triage vital signs and the nursing notes.   HISTORY  Chief Complaint Back Pain    HPI Robin Chan is a 45 y.o. female who presents for evaluation of low back pain. Patient states that she was moving some furniture last night and she felt a pulling sensation. States unable bear full weight needs to use a walker to ambulate. Patient complains of worsening pain with movement and rest with lying and 1 this position only. Describes pain as 10 over 10. No relief with over-the-counter medications.   Past Medical History  Diagnosis Date  . GERD (gastroesophageal reflux disease)   . Endometriosis   . Anxiety   . MS (multiple sclerosis) ([place])   . Painful menstruation   . Rectocele   . Heavy period   . Dyspareunia   . Complex ovarian cyst   . Fibroid   . Chronic pelvic pain in female   . ADHD (attention deficit hyperactivity disorder)   . Allergy   . Depression     Patient Active Problem List   Diagnosis Date Noted  . Rectocele 07/08/2015  . Status post laparoscopic assisted vaginal hysterectomy (LAVH) 07/08/2015  . History of hysterectomy for benign disease 05/26/2015  . Left lumbar radiculitis 03/28/2015  . Breast cancer screening 02/05/2015  . Abnormal CAT scan 12/31/2014  . ADD (attention deficit disorder) 12/31/2014  . Allergic to bees 12/31/2014  . Back muscle spasm 12/31/2014  . Insomnia, persistent 12/31/2014  . CN (constipation) 12/31/2014  . Depression, major, recurrent, mild ([place]) 12/31/2014  . Personal history of traumatic fracture 12/31/2014  . Asthma, mild intermittent 12/31/2014  . Migraine with aura and without status migrainosus, not intractable 12/31/2014  . Multiple sclerosis, relapsing-remitting ([place]) 12/31/2014  . Endometriosis 12/31/2014  . Perennial allergic rhinitis  12/31/2014  . Pituitary microadenoma ([place]) 12/31/2014  . Vitamin D deficiency 12/31/2014  . Acquired trigger finger 12/31/2014    Past Surgical History  Procedure Laterality Date  . Ovary surgery Left 09/30/2014  . Breast biopsy Right 2005    benign  . Laparoscopic assisted vaginal hysterectomy N/A 05/26/2015    Procedure: LAPAROSCOPIC ASSISTED VAGINAL HYSTERECTOMY, with right salpingectomy;  Surgeon: Brayton Mars, MD;  Location: [facility] ORS;  Service: Gynecology;  Laterality: N/A;  . Rectocele repair N/A 05/26/2015    Procedure: POSTERIOR REPAIR (RECTOCELE);  Surgeon: Brayton Mars, MD;  Location: [facility] ORS;  Service: Gynecology;  Laterality: N/A;    Current Outpatient Rx  Name  Route  Sig  Dispense  Refill  . albuterol (PROVENTIL HFA;VENTOLIN HFA) 108 (90 BASE) MCG/ACT inhaler   Inhalation   Inhale 1 puff into the lungs every 6 (six) hours as needed for wheezing or shortness of breath. Reported on 09/26/2015         . amphetamine-dextroamphetamine (ADDERALL XR) 20 MG 24 hr capsule   Oral   Take 1 capsule (20 mg total) by mouth every morning.   90 capsule   0   . cetirizine (ZYRTEC) 10 MG tablet   Oral   Take 10 mg by mouth daily.         . Cholecalciferol (VITAMIN D3) 1000 UNITS CHEW   Oral   Chew 2,000 Units by mouth daily.          . cyclobenzaprine (FLEXERIL) 10 MG tablet   Oral   Take 1 tablet (10 mg total) by mouth  3 (three) times daily as needed for muscle spasms.   30 tablet   0   . docusate sodium (COLACE) 100 MG capsule   Oral   Take 1 capsule (100 mg total) by mouth 2 (two) times daily.   10 capsule   0   . DULoxetine (CYMBALTA) 30 MG capsule   Oral   Take 1-2 capsules (30-60 mg total) by mouth daily. First week one capsule after that 2 daily   60 capsule   3   . EPINEPHrine 0.3 mg/0.3 mL IJ SOAJ injection   Intramuscular   Inject 0.3 mg into the muscle once. Reported on 09/26/2015         . fluticasone (FLONASE) 50 MCG/ACT nasal  spray   Each Nare   Place 2 sprays into both nostrils daily.   48 g   1   . HYDROcodone-acetaminophen (NORCO) 5-325 MG tablet   Oral   Take 1-2 tablets by mouth every 4 (four) hours as needed for moderate pain.   15 tablet   0   . ibuprofen (ADVIL,MOTRIN) 800 MG tablet   Oral   Take 1 tablet (800 mg total) by mouth 3 (three) times daily.   50 tablet   1   . levocetirizine (XYZAL) 5 MG tablet   Oral   Take 1 tablet (5 mg total) by mouth every evening.   90 tablet   4   . montelukast (SINGULAIR) 10 MG tablet   Oral   Take 1 tablet (10 mg total) by mouth at bedtime.   90 tablet   1   . Multiple Vitamins-Minerals (MULTIVITAMIN ADULT PO)   Oral   Take by mouth.         . naproxen (NAPROSYN) 500 MG tablet   Oral   Take 1 tablet (500 mg total) by mouth 2 (two) times daily with a meal.   60 tablet   0   . Omega-3 Fatty Acids (FISH OIL) 1000 MG CAPS   Oral   Take by mouth.         . Probiotic Product (PROBIOTIC DAILY PO)   Oral   Take by mouth.         . zolpidem (AMBIEN CR) 12.5 MG CR tablet   Oral   Take 1 tablet (12.5 mg total) by mouth at bedtime as needed for sleep.   90 tablet   1     Allergies Penicillins and Bee venom  Family History  Problem Relation Age of Onset  . Anemia Mother   . Osteoporosis Mother   . Mental illness Father     Bipolor  . Arthritis Brother   . Breast cancer Maternal Aunt     Social History Social History  Substance Use Topics  . Smoking status: Never Smoker   . Smokeless tobacco: Never Used  . Alcohol Use: No    Review of Systems Constitutional: No fever/chills Cardiovascular: Denies chest pain. Respiratory: Denies shortness of breath. Gastrointestinal: No abdominal pain.  No nausea, no vomiting.  No diarrhea.  No constipation. Genitourinary: Negative for dysuria. Musculoskeletal: Positive for back pain. Skin: Negative for rash. Neurological: Negative for headaches, focal weakness or numbness.  10-point  ROS otherwise negative.  ____________________________________________   PHYSICAL EXAM:  VITAL SIGNS: ED Triage Vitals  Enc Vitals Group     BP 02/09/16 0909 112/78 mmHg     Pulse Rate 02/09/16 0909 91     Resp 02/09/16 0909 18     Temp 02/09/16  0909 98.2 F (36.8 C)     Temp Source 02/09/16 0909 Oral     SpO2 02/09/16 0909 95 %     Weight 02/09/16 0909 190 lb (86.183 kg)     Height 02/09/16 0909 5\' 9"  (1.753 m)     Head Cir --      Peak Flow --      Pain Score 02/09/16 0910 10     Pain Loc --      Pain Edu? --      Excl. in [place]? --     Constitutional: Alert and oriented. Well appearing and in no acute distress.   Cardiovascular: Normal rate, regular rhythm. Grossly normal heart sounds.  Good peripheral circulation. Respiratory: Normal respiratory effort.  No retractions. Lungs CTAB. Gastrointestinal: Soft and nontender. No distention. No CVA tenderness. Musculoskeletal: Positive for low back pain. Positive lumbar tenderness. Straight leg raise positive left greater than right. Neurologic:  Normal speech and language. No gross focal neurologic deficits are appreciated. No gait instability. Skin:  Skin is warm, dry and intact. No rash noted. Psychiatric: Mood and affect are normal. Speech and behavior are normal.  ____________________________________________   LABS (all labs ordered are listed, but only abnormal results are displayed)  Labs Reviewed - No data to display ____________________________________________  EKG   ____________________________________________  RADIOLOGY   ____________________________________________   PROCEDURES  Procedure(s) performed: None  Critical Care performed: No  ____________________________________________   INITIAL IMPRESSION / ASSESSMENT AND PLAN / ED COURSE  Pertinent labs & imaging results that were available during my care of the patient were reviewed by me and considered in my medical decision making (see chart for  details).  Acute LS strain. Patient was given Toradol and Valium IM while in the ED with improvement in pain. Patient was discharged home with prescription for Flexeril 10 mg 3 times a day and Naprosyn 500 mg twice a day. Work excuse 24 hours given. Patient follow-up PCP or return to ER with any worsening symptomology. She voices no other emergency medical complaints at this time. ____________________________________________   FINAL CLINICAL IMPRESSION(S) / ED DIAGNOSES  Final diagnoses:  Lumbar strain, initial encounter     This chart was dictated using voice recognition software/Dragon. Despite best efforts to proofread, errors can occur which can change the meaning. Any change was purely unintentional.   Arlyss Repress, PA-C 02/09/16 [place], MD 02/09/16 1535

## 2016-02-09 NOTE — Discharge Instructions (Signed)

## 2016-02-09 NOTE — ED Notes (Signed)
Pt states she was moving a piece of furniture last night and began having severe pain to the lower back.. Pt is ambulatory with a walker.. States it is painful to sit.Robin Chan

## 2016-02-17 ENCOUNTER — Encounter: Payer: Self-pay | Admitting: Family Medicine

## 2016-02-17 ENCOUNTER — Ambulatory Visit (INDEPENDENT_AMBULATORY_CARE_PROVIDER_SITE_OTHER): Payer: 59 | Admitting: Family Medicine

## 2016-02-17 ENCOUNTER — Other Ambulatory Visit: Payer: Self-pay | Admitting: Family Medicine

## 2016-02-17 VITALS — BP 110/68 | HR 95 | Temp 98.3°F | Resp 18 | Ht 69.0 in | Wt 197.6 lb

## 2016-02-17 DIAGNOSIS — M5441 Lumbago with sciatica, right side: Secondary | ICD-10-CM | POA: Diagnosis not present

## 2016-02-17 MED ORDER — DULOXETINE HCL 30 MG PO CPEP: 60.0000 mg | ORAL_CAPSULE | Freq: Every day | ORAL | 0 refills | Status: DC

## 2016-02-17 MED ORDER — PREDNISONE 10 MG (48) PO TBPK: ORAL_TABLET | Freq: Every day | ORAL | 0 refills | Status: DC

## 2016-02-17 NOTE — Progress Notes (Signed)
Name: Robin Chan   MRN: [id number]    DOB: 1971/04/15   Date:02/17/2016       Progress Note  Subjective  Chief Complaint  Chief Complaint  Patient presents with  . Back Pain    pain started last Sunday and has not gotten any better    HPI  Back pain acute: she states she moved her dresser 10 days ago and developed acute onset within a couple of minutes. Initially just on right side, described as burning, and worse with movement, and was radiating to right leg. It was hard for her to move and husband took her to [facility]. They gave her hydrocodone and increased dose of Flexeril and pain is better went from 10 to about an 8. However pain is also to the left side. No longer shooting down legs, but uncomfortable on both sides. No bowel or bladder incontinence.  Patient Active Problem List   Diagnosis Date Noted  . Rectocele 07/08/2015  . Status post laparoscopic assisted vaginal hysterectomy (LAVH) 07/08/2015  . History of hysterectomy for benign disease 05/26/2015  . Left lumbar radiculitis 03/28/2015  . Breast cancer screening 02/05/2015  . Abnormal CAT scan 12/31/2014  . ADD (attention deficit disorder) 12/31/2014  . Allergic to bees 12/31/2014  . Back muscle spasm 12/31/2014  . Insomnia, persistent 12/31/2014  . CN (constipation) 12/31/2014  . Depression, major, recurrent, mild ([place]) 12/31/2014  . Personal history of traumatic fracture 12/31/2014  . Asthma, mild intermittent 12/31/2014  . Migraine with aura and without status migrainosus, not intractable 12/31/2014  . Multiple sclerosis, relapsing-remitting ([place]) 12/31/2014  . Endometriosis 12/31/2014  . Perennial allergic rhinitis 12/31/2014  . Pituitary microadenoma ([place]) 12/31/2014  . Vitamin D deficiency 12/31/2014  . Acquired trigger finger 12/31/2014    Past Surgical History:  Procedure Laterality Date  . BREAST BIOPSY Right 2005   benign  . LAPAROSCOPIC ASSISTED VAGINAL HYSTERECTOMY N/A 05/26/2015   Procedure:  LAPAROSCOPIC ASSISTED VAGINAL HYSTERECTOMY, with right salpingectomy;  Surgeon: Brayton Mars, MD;  Location: [facility] ORS;  Service: Gynecology;  Laterality: N/A;  . OVARY SURGERY Left 09/30/2014  . RECTOCELE REPAIR N/A 05/26/2015   Procedure: POSTERIOR REPAIR (RECTOCELE);  Surgeon: Brayton Mars, MD;  Location: [facility] ORS;  Service: Gynecology;  Laterality: N/A;    Family History  Problem Relation Age of Onset  . Anemia Mother   . Osteoporosis Mother   . Mental illness Father     Bipolor  . Arthritis Brother   . Breast cancer Maternal Aunt     Social History   Social History  . Marital status: Married    Spouse name: N/A  . Number of children: N/A  . Years of education: N/A   Occupational History  . Not on file.   Social History Main Topics  . Smoking status: Never Smoker  . Smokeless tobacco: Never Used  . Alcohol use No  . Drug use: No  . Sexual activity: Not Currently   Other Topics Concern  . Not on file   Social History Narrative  . No narrative on file     Current Outpatient Prescriptions:  .  albuterol (PROVENTIL HFA;VENTOLIN HFA) 108 (90 BASE) MCG/ACT inhaler, Inhale 1 puff into the lungs every 6 (six) hours as needed for wheezing or shortness of breath. Reported on 09/26/2015, Disp: , Rfl:  .  amphetamine-dextroamphetamine (ADDERALL XR) 20 MG 24 hr capsule, Take 1 capsule (20 mg total) by mouth every morning., Disp: 90 capsule, Rfl: 0 .  cetirizine (ZYRTEC) 10 MG tablet, Take 10 mg by mouth daily., Disp: , Rfl:  .  Cholecalciferol (VITAMIN D3) 1000 UNITS CHEW, Chew 2,000 Units by mouth daily. , Disp: , Rfl:  .  cyclobenzaprine (FLEXERIL) 10 MG tablet, Take 1 tablet (10 mg total) by mouth 3 (three) times daily as needed for muscle spasms., Disp: 30 tablet, Rfl: 0 .  docusate sodium (COLACE) 100 MG capsule, Take 1 capsule (100 mg total) by mouth 2 (two) times daily., Disp: 10 capsule, Rfl: 0 .  DULoxetine (CYMBALTA) 30 MG capsule, Take 1-2 capsules  (30-60 mg total) by mouth daily. First week one capsule after that 2 daily, Disp: 60 capsule, Rfl: 3 .  EPINEPHrine 0.3 mg/0.3 mL IJ SOAJ injection, Inject 0.3 mg into the muscle once. Reported on 09/26/2015, Disp: , Rfl:  .  fluticasone (FLONASE) 50 MCG/ACT nasal spray, Place 2 sprays into both nostrils daily., Disp: 48 g, Rfl: 1 .  HYDROcodone-acetaminophen (NORCO) 5-325 MG tablet, Take 1-2 tablets by mouth every 4 (four) hours as needed for moderate pain., Disp: 15 tablet, Rfl: 0 .  ibuprofen (ADVIL,MOTRIN) 800 MG tablet, Take 1 tablet (800 mg total) by mouth 3 (three) times daily., Disp: 50 tablet, Rfl: 1 .  levocetirizine (XYZAL) 5 MG tablet, Take 1 tablet (5 mg total) by mouth every evening., Disp: 90 tablet, Rfl: 4 .  montelukast (SINGULAIR) 10 MG tablet, Take 1 tablet (10 mg total) by mouth at bedtime., Disp: 90 tablet, Rfl: 1 .  Multiple Vitamins-Minerals (MULTIVITAMIN ADULT PO), Take by mouth., Disp: , Rfl:  .  naproxen (NAPROSYN) 500 MG tablet, Take 1 tablet (500 mg total) by mouth 2 (two) times daily with a meal., Disp: 60 tablet, Rfl: 0 .  Omega-3 Fatty Acids (FISH OIL) 1000 MG CAPS, Take by mouth., Disp: , Rfl:  .  Probiotic Product (PROBIOTIC DAILY PO), Take by mouth., Disp: , Rfl:  .  zolpidem (AMBIEN CR) 12.5 MG CR tablet, Take 1 tablet (12.5 mg total) by mouth at bedtime as needed for sleep., Disp: 90 tablet, Rfl: 1  Allergies  Allergen Reactions  . Penicillins Anaphylaxis  . Bee Venom     Bee     ROS  Ten systems reviewed and is negative except as mentioned in HPI   Objective  Vitals:   02/17/16 1146  BP: 110/68  Pulse: 95  Resp: 18  Temp: 98.3 F (36.8 C)  SpO2: 98%  Weight: 197 lb 9 oz (89.6 kg)  Height: 5\' 9"  (1.753 m)    Body mass index is 29.17 kg/m.  Physical Exam  Constitutional: Patient appears well-developed and well-nourished. Obese  No distress.  HEENT: head atraumatic, normocephalic, pupils equal and reactive to light,  neck supple, throat  within normal limits Cardiovascular: Normal rate, regular rhythm and normal heart sounds.  No murmur heard. No BLE edema. Pulmonary/Chest: Effort normal and breath sounds normal. No respiratory distress. Abdominal: Soft.  There is no tenderness. Psychiatric: Patient has a normal mood and affect. behavior is normal. Judgment and thought content normal. Muscular Skeletal: pain during palpation of right lumbar spine, and positive straight leg raise on left side.   PHQ2/9: Depression screen [facility] 2/9 12/30/2015 09/26/2015 06/27/2015 03/28/2015 12/31/2014  Decreased Interest 2 0 0 0 1  Down, Depressed, Hopeless 3 0 1 0 -  PHQ - 2 Score 5 0 1 0 1  Altered sleeping 3 - - - 3  Tired, decreased energy 3 - - - 2  Change in appetite 1 - - -  0  Feeling bad or failure about yourself  1 - - - 0  Trouble concentrating 2 - - - 0  Moving slowly or fidgety/restless 0 - - - 0  Suicidal thoughts 0 - - - 0  PHQ-9 Score 15 - - - 6  Difficult doing work/chores - - - - Somewhat difficult     Fall Risk: Fall Risk  12/30/2015 09/26/2015 06/27/2015 03/28/2015 12/31/2014  Falls in the past year? No No No No No     Assessment & Plan  1. Acute back pain with sciatica, right  - predniSONE (STERAPRED UNI-PAK 48 TAB) 10 MG (48) TBPK tablet; Take by mouth daily.  Dispense: 42 tablet; Refill: 0 Advised warm compresses, move around, discussed prednisone taper and if no relieve return for trigger point injections. She seems to be improving. Continue muscle relaxer, advised not to take nsaid's with prednisone because of risk of GI bleed

## 2016-03-06 ENCOUNTER — Other Ambulatory Visit: Payer: Self-pay | Admitting: Family Medicine

## 2016-03-15 ENCOUNTER — Encounter: Payer: Self-pay | Admitting: Family Medicine

## 2016-03-15 ENCOUNTER — Ambulatory Visit (INDEPENDENT_AMBULATORY_CARE_PROVIDER_SITE_OTHER): Payer: 59 | Admitting: Family Medicine

## 2016-03-15 ENCOUNTER — Ambulatory Visit: Payer: 59 | Admitting: Family Medicine

## 2016-03-15 VITALS — BP 110/72 | HR 97 | Temp 98.0°F | Resp 18 | Ht 69.0 in | Wt 194.3 lb

## 2016-03-15 DIAGNOSIS — Z0000 Encounter for general adult medical examination without abnormal findings: Secondary | ICD-10-CM | POA: Diagnosis not present

## 2016-03-15 DIAGNOSIS — Z1239 Encounter for other screening for malignant neoplasm of breast: Secondary | ICD-10-CM | POA: Diagnosis not present

## 2016-03-15 DIAGNOSIS — Z01419 Encounter for gynecological examination (general) (routine) without abnormal findings: Secondary | ICD-10-CM

## 2016-03-15 MED ORDER — POLYETHYLENE GLYCOL 3350 17 GM/SCOOP PO POWD: 17.0000 g | Freq: Every day | ORAL | 0 refills | Status: DC

## 2016-03-15 MED ORDER — POLYETHYLENE GLYCOL 3350 17 GM/SCOOP PO POWD: 17.0000 g | Freq: Two times a day (BID) | ORAL | 0 refills | Status: DC | PRN

## 2016-03-15 NOTE — Progress Notes (Signed)
Name: Robin Chan   MRN: [id number]    DOB: [birth year]   Date:03/15/2016       Progress Note  Subjective  Chief Complaint  Chief Complaint  Patient presents with  . Annual Exam    HPI  Well Woman: she has a hysterectomy October 2016 for treatment of bleeding - she has noticed constipation and took Colace it seemed to make her stomach hurt so she has been taking Miralax and is feeling better. She is due for mammogram. She is not sexually active ( husband has ED ), but is intimate with her husband. Going to [place] to celebrate their anniversary .    Patient Active Problem List   Diagnosis Date Noted  . Rectocele 07/08/2015  . Status post laparoscopic assisted vaginal hysterectomy (LAVH) 07/08/2015  . History of hysterectomy for benign disease 05/26/2015  . Left lumbar radiculitis 03/28/2015  . Breast cancer screening 02/05/2015  . Abnormal CAT scan 12/31/2014  . ADD (attention deficit disorder) 12/31/2014  . Allergic to bees 12/31/2014  . Back muscle spasm 12/31/2014  . Insomnia, persistent 12/31/2014  . CN (constipation) 12/31/2014  . Depression, major, recurrent, mild ([place]) 12/31/2014  . Personal history of traumatic fracture 12/31/2014  . Asthma, mild intermittent 12/31/2014  . Migraine with aura and without status migrainosus, not intractable 12/31/2014  . Multiple sclerosis, relapsing-remitting ([place]) 12/31/2014  . Endometriosis 12/31/2014  . Perennial allergic rhinitis 12/31/2014  . Pituitary microadenoma ([place]) 12/31/2014  . Vitamin D deficiency 12/31/2014  . Acquired trigger finger 12/31/2014    Past Surgical History:  Procedure Laterality Date  . ABDOMINAL HYSTERECTOMY  05/26/2015  . BREAST BIOPSY Right 2005   benign  . LAPAROSCOPIC ASSISTED VAGINAL HYSTERECTOMY N/A 05/26/2015   Procedure: LAPAROSCOPIC ASSISTED VAGINAL HYSTERECTOMY, with right salpingectomy;  Surgeon: Brayton Mars, MD;  Location: [facility] ORS;  Service: Gynecology;  Laterality: N/A;  .  OVARY SURGERY Left 09/30/2014  . RECTOCELE REPAIR N/A 05/26/2015   Procedure: POSTERIOR REPAIR (RECTOCELE);  Surgeon: Brayton Mars, MD;  Location: [facility] ORS;  Service: Gynecology;  Laterality: N/A;    Family History  Problem Relation Age of Onset  . Anemia Mother   . Osteoporosis Mother   . Mental illness Father     Bipolor  . Arthritis Brother   . Breast cancer Maternal Aunt     Social History   Social History  . Marital status: Married    Spouse name: N/A  . Number of children: N/A  . Years of education: N/A   Occupational History  . Not on file.   Social History Main Topics  . Smoking status: Never Smoker  . Smokeless tobacco: Never Used  . Alcohol use No  . Drug use: No  . Sexual activity: Not Currently     Comment: husband has ED   Other Topics Concern  . Not on file   Social History Narrative  . No narrative on file     Current Outpatient Prescriptions:  .  albuterol (PROVENTIL HFA;VENTOLIN HFA) 108 (90 BASE) MCG/ACT inhaler, Inhale 1 puff into the lungs every 6 (six) hours as needed for wheezing or shortness of breath. Reported on 09/26/2015, Disp: , Rfl:  .  amphetamine-dextroamphetamine (ADDERALL XR) 20 MG 24 hr capsule, Take 1 capsule (20 mg total) by mouth every morning., Disp: 90 capsule, Rfl: 0 .  cetirizine (ZYRTEC) 10 MG tablet, Take 10 mg by mouth daily., Disp: , Rfl:  .  Cholecalciferol (VITAMIN D3) 1000 UNITS CHEW, Chew 2,000 Units  by mouth daily. , Disp: , Rfl:  .  cyclobenzaprine (FLEXERIL) 10 MG tablet, Take 1 tablet (10 mg total) by mouth 3 (three) times daily as needed for muscle spasms., Disp: 30 tablet, Rfl: 0 .  DULoxetine (CYMBALTA) 30 MG capsule, Take 1 capsule by mouth  daily for 1st week, then 2  capsules once a day, Disp: 90 capsule, Rfl: 1 .  EPINEPHrine 0.3 mg/0.3 mL IJ SOAJ injection, Inject 0.3 mg into the muscle once. Reported on 09/26/2015, Disp: , Rfl:  .  fluticasone (FLONASE) 50 MCG/ACT nasal spray, Place 2 sprays into both  nostrils daily., Disp: 48 g, Rfl: 1 .  ibuprofen (ADVIL,MOTRIN) 800 MG tablet, Take 1 tablet (800 mg total) by mouth 3 (three) times daily., Disp: 50 tablet, Rfl: 1 .  levocetirizine (XYZAL) 5 MG tablet, Take 1 tablet (5 mg total) by mouth every evening., Disp: 90 tablet, Rfl: 4 .  montelukast (SINGULAIR) 10 MG tablet, Take 1 tablet (10 mg total) by mouth at bedtime., Disp: 90 tablet, Rfl: 1 .  Multiple Vitamins-Minerals (MULTIVITAMIN ADULT PO), Take by mouth., Disp: , Rfl:  .  naproxen (NAPROSYN) 500 MG tablet, Take 1 tablet (500 mg total) by mouth 2 (two) times daily with a meal., Disp: 60 tablet, Rfl: 0 .  Omega-3 Fatty Acids (FISH OIL) 1000 MG CAPS, Take by mouth., Disp: , Rfl:  .  Probiotic Product (PROBIOTIC DAILY PO), Take by mouth., Disp: , Rfl:  .  zolpidem (AMBIEN CR) 12.5 MG CR tablet, Take 1 tablet (12.5 mg total) by mouth at bedtime as needed for sleep., Disp: 90 tablet, Rfl: 1  Allergies  Allergen Reactions  . Penicillins Anaphylaxis  . Bee Venom     Bee     ROS  Constitutional: Negative for fever or significant weight change.  Respiratory: Negative for cough and shortness of breath.   Cardiovascular: Negative for chest pain or palpitations.  Gastrointestinal: Negative for abdominal pain, no bowel changes.  Musculoskeletal: Positive for gait problem or joint swelling.  Skin: Negative for rash.  Neurological: Negative for dizziness or headache.  No other specific complaints in a complete review of systems (except as listed in HPI above) . Objective  Vitals:   03/15/16 0758  BP: 110/72  Pulse: 97  Resp: 18  Temp: 98 F (36.7 C)  SpO2: 97%  Weight: 194 lb 5 oz (88.1 kg)  Height: 5\' 9"  (1.753 m)    Body mass index is 28.69 kg/m.  Physical Exam  Constitutional: Patient appears well-developed and well-nourished. No distress.  HENT: Head: Normocephalic and atraumatic. Ears: B TMs ok, no erythema or effusion; Nose: Nose normal. Mouth/Throat: Oropharynx is clear  and moist. No oropharyngeal exudate.  Eyes: Conjunctivae and EOM are normal. Pupils are equal, round, and reactive to light. No scleral icterus.  Neck: Normal range of motion. Neck supple. No JVD present. No thyromegaly present.  Cardiovascular: Normal rate, regular rhythm and normal heart sounds.  No murmur heard. No BLE edema. Pulmonary/Chest: Effort normal and breath sounds normal. No respiratory distress. Abdominal: Soft. Bowel sounds are normal, no distension. There is no tenderness. no masses Breast: no lumps or masses, no nipple discharge or rashes FEMALE GENITALIA:  Not done RECTAL: not done Musculoskeletal: Normal range of motion, no joint effusions. No gross deformities.  Neurological: he is alert and oriented to person, place, and time. No cranial nerve deficit. Poor balance, mild dysarthria.  Skin: Skin is warm and dry. No rash noted. No erythema.  Psychiatric: Patient  has a normal mood and affect. behavior is normal. Judgment and thought content normal.  PHQ2/9: Depression screen [facility] 2/9 03/15/2016 12/30/2015 09/26/2015 06/27/2015 03/28/2015  Decreased Interest 0 2 0 0 0  Down, Depressed, Hopeless 0 3 0 1 0  PHQ - 2 Score 0 5 0 1 0  Altered sleeping - 3 - - -  Tired, decreased energy - 3 - - -  Change in appetite - 1 - - -  Feeling bad or failure about yourself  - 1 - - -  Trouble concentrating - 2 - - -  Moving slowly or fidgety/restless - 0 - - -  Suicidal thoughts - 0 - - -  PHQ-9 Score - 15 - - -  Difficult doing work/chores - - - - -     Fall Risk: Fall Risk  03/15/2016 12/30/2015 09/26/2015 06/27/2015 03/28/2015  Falls in the past year? Yes No No No No  Number falls in past yr: 1 - - - -  Injury with Fall? No - - - -     Functional Status Survey: Is the patient deaf or have difficulty hearing?: No Does the patient have difficulty seeing, even when wearing glasses/contacts?: No Does the patient have difficulty concentrating, remembering, or making decisions?: No Does the  patient have difficulty walking or climbing stairs?: Yes Does the patient have difficulty dressing or bathing?: No Does the patient have difficulty doing errands alone such as visiting a doctor's office or shopping?: No    Assessment & Plan  1. Well woman exam  Discussed importance of 150 minutes of physical activity weekly, eat two servings of fish weekly, eat one serving of tree nuts ( cashews, pistachios, pecans, almonds.Marland Kitchen) every other day, eat 6 servings of fruit/vegetables daily and drink plenty of water and avoid sweet beverages.   - TSH - Vitamin B12 - VITAMIN D 25 Hydroxy (Vit-D Deficiency, Fractures) - CBC with Differential/Platelet - Comprehensive metabolic panel - Hemoglobin A1c - Lipid panel  2. Breast cancer screening  - MM Digital Screening; Future

## 2016-03-16 LAB — CBC WITH DIFFERENTIAL/PLATELET
Basophils Absolute: 0 10*3/uL (ref 0.0–0.2)
Basos: 0 %
EOS (ABSOLUTE): 0.2 10*3/uL (ref 0.0–0.4)
Eos: 3 %
Hematocrit: 37.6 % (ref 34.0–46.6)
Hemoglobin: 12.2 g/dL (ref 11.1–15.9)
Immature Grans (Abs): 0 10*3/uL (ref 0.0–0.1)
Immature Granulocytes: 1 %
Lymphocytes Absolute: 2.2 10*3/uL (ref 0.7–3.1)
Lymphs: 42 %
MCH: 29.5 pg (ref 26.6–33.0)
MCHC: 32.4 g/dL (ref 31.5–35.7)
MCV: 91 fL (ref 79–97)
Monocytes Absolute: 0.4 10*3/uL (ref 0.1–0.9)
Monocytes: 7 %
Neutrophils Absolute: 2.5 10*3/uL (ref 1.4–7.0)
Neutrophils: 47 %
Platelets: 404 10*3/uL — ABNORMAL HIGH (ref 150–379)
RBC: 4.14 x10E6/uL (ref 3.77–5.28)
RDW: 13.5 % (ref 12.3–15.4)
WBC: 5.3 10*3/uL (ref 3.4–10.8)

## 2016-03-16 LAB — LIPID PANEL
Chol/HDL Ratio: 2.5 ratio units (ref 0.0–4.4)
Cholesterol, Total: 167 mg/dL (ref 100–199)
HDL: 67 mg/dL (ref >39)
LDL Calculated: 85 mg/dL (ref 0–99)
Triglycerides: 74 mg/dL (ref 0–149)
VLDL Cholesterol Cal: 15 mg/dL (ref 5–40)

## 2016-03-16 LAB — COMPREHENSIVE METABOLIC PANEL
ALT: 13 IU/L (ref 0–32)
AST: 11 IU/L (ref 0–40)
Albumin/Globulin Ratio: 1.7 (ref 1.2–2.2)
Albumin: 4.1 g/dL (ref 3.5–5.5)
Alkaline Phosphatase: 76 IU/L (ref 39–117)
BUN/Creatinine Ratio: 13 (ref 9–23)
BUN: 13 mg/dL (ref 6–24)
Bilirubin Total: 0.3 mg/dL (ref 0.0–1.2)
CO2: 27 mmol/L (ref 18–29)
Calcium: 9.2 mg/dL (ref 8.7–10.2)
Chloride: 100 mmol/L (ref 96–106)
Creatinine, Ser: 1.02 mg/dL — ABNORMAL HIGH (ref 0.57–1.00)
GFR calc Af Amer: 77 mL/min/{1.73_m2} (ref >59)
GFR calc non Af Amer: 67 mL/min/{1.73_m2} (ref >59)
Globulin, Total: 2.4 g/dL (ref 1.5–4.5)
Glucose: 97 mg/dL (ref 65–99)
Potassium: 4.5 mmol/L (ref 3.5–5.2)
Sodium: 140 mmol/L (ref 134–144)
Total Protein: 6.5 g/dL (ref 6.0–8.5)

## 2016-03-16 LAB — VITAMIN B12: Vitamin B-12: 500 pg/mL (ref 211–946)

## 2016-03-16 LAB — TSH: TSH: 1.36 u[IU]/mL (ref 0.450–4.500)

## 2016-03-16 LAB — HEMOGLOBIN A1C
Est. average glucose Bld gHb Est-mCnc: 114 mg/dL
Hgb A1c MFr Bld: 5.6 % (ref 4.8–5.6)

## 2016-03-16 LAB — VITAMIN D 25 HYDROXY (VIT D DEFICIENCY, FRACTURES): Vit D, 25-Hydroxy: 33.3 ng/mL (ref 30.0–100.0)

## 2016-03-23 ENCOUNTER — Encounter: Payer: 59 | Admitting: Family Medicine

## 2016-03-30 ENCOUNTER — Encounter: Payer: Self-pay | Admitting: Family Medicine

## 2016-03-30 ENCOUNTER — Ambulatory Visit (INDEPENDENT_AMBULATORY_CARE_PROVIDER_SITE_OTHER): Payer: 59 | Admitting: Family Medicine

## 2016-03-30 VITALS — BP 110/64 | HR 93 | Temp 98.1°F | Resp 18 | Ht 69.5 in | Wt 194.3 lb

## 2016-03-30 DIAGNOSIS — G35 Multiple sclerosis: Secondary | ICD-10-CM

## 2016-03-30 DIAGNOSIS — J4520 Mild intermittent asthma, uncomplicated: Secondary | ICD-10-CM

## 2016-03-30 DIAGNOSIS — G4700 Insomnia, unspecified: Secondary | ICD-10-CM | POA: Diagnosis not present

## 2016-03-30 DIAGNOSIS — Z23 Encounter for immunization: Secondary | ICD-10-CM

## 2016-03-30 DIAGNOSIS — G35A Relapsing-remitting multiple sclerosis: Secondary | ICD-10-CM

## 2016-03-30 DIAGNOSIS — E663 Overweight: Secondary | ICD-10-CM

## 2016-03-30 DIAGNOSIS — F909 Attention-deficit hyperactivity disorder, unspecified type: Secondary | ICD-10-CM

## 2016-03-30 DIAGNOSIS — F988 Other specified behavioral and emotional disorders with onset usually occurring in childhood and adolescence: Secondary | ICD-10-CM

## 2016-03-30 DIAGNOSIS — F330 Major depressive disorder, recurrent, mild: Secondary | ICD-10-CM | POA: Diagnosis not present

## 2016-03-30 MED ORDER — AMPHETAMINE-DEXTROAMPHET ER 20 MG PO CP24: 20.0000 mg | ORAL_CAPSULE | ORAL | 0 refills | Status: DC

## 2016-03-30 MED ORDER — DULOXETINE HCL 60 MG PO CPEP: 60.0000 mg | ORAL_CAPSULE | Freq: Every day | ORAL | 1 refills | Status: DC

## 2016-03-30 NOTE — Progress Notes (Signed)
Name: Robin Chan   MRN: [id number]    DOB: 1971-01-09   Date:03/30/2016       Progress Note  Subjective  Chief Complaint  No chief complaint on file.   HPI  [id number] medication daily as prescribed, on Adderal XR 20 mg now and she states she has been feeling well with medication, no side effects. She has been able to focus better since Cymbalta started to work for her - started medication Summer 2017  MS: diagnosed in 2000, but first symptoms in 1991 - she felt numb on the right side and weakness. Currently off medication because of multiple side effects. Has intermittent weakness and has used a cane in the past but not need to use it in many months. She still has episodes of left foot drop but able to manage without any assistance. Adderall helps with energy level  Insomnia: still taking Ambien, she states she is able to fall asleep but still wakes up during the night - at times has difficulty falling back asleep - if it is noisy.  No side effects, such as amnesia. She takes it when she gets in bed but takes about one hour to fall asleep.   Major Depression: she was  taking Lexapro for many years and depression was not controlled, we switched to Cymbalta in June 2017 and she states that within days she noticed an improvement and now she feels like she is back to her normal self.  Her daughter is 44 yo and may have autism spectrum disorder, but is adjusting to attending [employer].   Asthma: she is taking singulair daily, she has dry coughing spells. She states usually triggered by getting hot. Resolves once she cools off. She denies wheezing or SOB.   Low back pain: she is doing well now, states no pain  Gastroenteritis: she had a severe episode this past weekend, it lasted about 24 hours. Nausea, vomiting and fever, no longer sick now. Able to eat.   Overweight: she is on Herbal Life, one shake daily and has lost a few lbs since last visit  Patient Active Problem List   Diagnosis  Date Noted  . Rectocele 07/08/2015  . Status post laparoscopic assisted vaginal hysterectomy (LAVH) 07/08/2015  . History of hysterectomy for benign disease 05/26/2015  . Left lumbar radiculitis 03/28/2015  . Breast cancer screening 02/05/2015  . Abnormal CAT scan 12/31/2014  . ADD (attention deficit disorder) 12/31/2014  . Allergic to bees 12/31/2014  . Back muscle spasm 12/31/2014  . Insomnia, persistent 12/31/2014  . CN (constipation) 12/31/2014  . Depression, major, recurrent, mild ([place]) 12/31/2014  . Personal history of traumatic fracture 12/31/2014  . Asthma, mild intermittent 12/31/2014  . Migraine with aura and without status migrainosus, not intractable 12/31/2014  . Multiple sclerosis, relapsing-remitting ([place]) 12/31/2014  . Endometriosis 12/31/2014  . Perennial allergic rhinitis 12/31/2014  . Pituitary microadenoma ([place]) 12/31/2014  . Vitamin D deficiency 12/31/2014  . Acquired trigger finger 12/31/2014    Past Surgical History:  Procedure Laterality Date  . ABDOMINAL HYSTERECTOMY  05/26/2015  . BREAST BIOPSY Right 2005   benign  . LAPAROSCOPIC ASSISTED VAGINAL HYSTERECTOMY N/A 05/26/2015   Procedure: LAPAROSCOPIC ASSISTED VAGINAL HYSTERECTOMY, with right salpingectomy;  Surgeon: Brayton Mars, MD;  Location: [facility] ORS;  Service: Gynecology;  Laterality: N/A;  . OVARY SURGERY Left 09/30/2014  . RECTOCELE REPAIR N/A 05/26/2015   Procedure: POSTERIOR REPAIR (RECTOCELE);  Surgeon: Brayton Mars, MD;  Location: [facility] ORS;  Service: Gynecology;  Laterality: N/A;    Family History  Problem Relation Age of Onset  . Anemia Mother   . Osteoporosis Mother   . Mental illness Father     Bipolor  . Arthritis Brother   . Breast cancer Maternal Aunt     Social History   Social History  . Marital status: Married    Spouse name: N/A  . Number of children: N/A  . Years of education: N/A   Occupational History  . Not on file.   Social History Main Topics   . Smoking status: Never Smoker  . Smokeless tobacco: Never Used  . Alcohol use No  . Drug use: No  . Sexual activity: Not Currently     Comment: husband has ED   Other Topics Concern  . Not on file   Social History Narrative  . No narrative on file     Current Outpatient Prescriptions:  .  albuterol (PROVENTIL HFA;VENTOLIN HFA) 108 (90 BASE) MCG/ACT inhaler, Inhale 1 puff into the lungs every 6 (six) hours as needed for wheezing or shortness of breath. Reported on 09/26/2015, Disp: , Rfl:  .  amphetamine-dextroamphetamine (ADDERALL XR) 20 MG 24 hr capsule, Take 1 capsule (20 mg total) by mouth every morning., Disp: 90 capsule, Rfl: 0 .  cetirizine (ZYRTEC) 10 MG tablet, Take 10 mg by mouth daily., Disp: , Rfl:  .  Cholecalciferol (VITAMIN D3) 1000 UNITS CHEW, Chew 2,000 Units by mouth daily. , Disp: , Rfl:  .  cyclobenzaprine (FLEXERIL) 10 MG tablet, Take 1 tablet (10 mg total) by mouth 3 (three) times daily as needed for muscle spasms., Disp: 30 tablet, Rfl: 0 .  DULoxetine (CYMBALTA) 30 MG capsule, Take 1 capsule by mouth  daily for 1st week, then 2  capsules once a day, Disp: 90 capsule, Rfl: 1 .  EPINEPHrine 0.3 mg/0.3 mL IJ SOAJ injection, Inject 0.3 mg into the muscle once. Reported on 09/26/2015, Disp: , Rfl:  .  fluticasone (FLONASE) 50 MCG/ACT nasal spray, Place 2 sprays into both nostrils daily., Disp: 48 g, Rfl: 1 .  ibuprofen (ADVIL,MOTRIN) 800 MG tablet, Take 1 tablet (800 mg total) by mouth 3 (three) times daily., Disp: 50 tablet, Rfl: 1 .  levocetirizine (XYZAL) 5 MG tablet, Take 1 tablet (5 mg total) by mouth every evening., Disp: 90 tablet, Rfl: 4 .  montelukast (SINGULAIR) 10 MG tablet, Take 1 tablet (10 mg total) by mouth at bedtime., Disp: 90 tablet, Rfl: 1 .  Multiple Vitamins-Minerals (MULTIVITAMIN ADULT PO), Take by mouth., Disp: , Rfl:  .  naproxen (NAPROSYN) 500 MG tablet, Take 1 tablet (500 mg total) by mouth 2 (two) times daily with a meal., Disp: 60 tablet, Rfl:  0 .  Omega-3 Fatty Acids (FISH OIL) 1000 MG CAPS, Take by mouth., Disp: , Rfl:  .  polyethylene glycol powder (GLYCOLAX/MIRALAX) powder, Take 17 g by mouth daily., Disp: 3350 g, Rfl: 0 .  Probiotic Product (PROBIOTIC DAILY PO), Take by mouth., Disp: , Rfl:  .  zolpidem (AMBIEN CR) 12.5 MG CR tablet, Take 1 tablet (12.5 mg total) by mouth at bedtime as needed for sleep., Disp: 90 tablet, Rfl: 1  Allergies  Allergen Reactions  . Penicillins Anaphylaxis  . Bee Venom     Bee     ROS  Constitutional: Negative for fever or significant weight change.  Respiratory: Negative for cough and shortness of breath.   Cardiovascular: Negative for chest pain or palpitations.  Gastrointestinal: Negative for abdominal pain, no  bowel changes.  Musculoskeletal: Negative for gait problem or joint swelling.  Skin: Negative for rash.  Neurological: Negative for dizziness or headache.  No other specific complaints in a complete review of systems (except as listed in HPI above).  Objective  Vitals:   03/30/16 0743  Weight: 194 lb 4.8 oz (88.1 kg)    Body mass index is 28.69 kg/m.  Physical Exam  Constitutional: Patient appears well-developed and well-nourished. Overweight. No distress.  HEENT: head atraumatic, normocephalic, pupils equal and reactive to light,  neck supple, throat within normal limits Cardiovascular: Normal rate, regular rhythm and normal heart sounds.  No murmur heard. No BLE edema. Pulmonary/Chest: Effort normal and breath sounds normal. No respiratory distress. Abdominal: Soft.  There is no tenderness. Psychiatric: Patient has a normal mood and affect. behavior is normal. Judgment and thought content normal. Neurological: no weakness and normal sensation   Recent Results (from the past 2160 hour(s))  VITAMIN D 25 Hydroxy (Vit-D Deficiency, Fractures)     Status: None   Collection Time: 03/15/16  8:45 AM  Result Value Ref Range   Vit D, 25-Hydroxy 33.3 30.0 - 100.0 ng/mL     Comment: Vitamin D deficiency has been defined by the [place] practice guideline as a level of serum 25-OH vitamin D less than 20 ng/mL (1,2). The Endocrine Society went on to further define vitamin D insufficiency as a level between 21 and 29 ng/mL (2). 1. IOM (Institute of Medicine). 2010. Dietary reference    intakes for calcium and D. [place]: The    [employer]. 2. Holick MF, Binkley [place], Bischoff-Ferrari HA, et al.    Evaluation, treatment, and prevention of vitamin D    deficiency: an Endocrine Society clinical practice    guideline. JCEM. 2011 Jul; 96(7):1911-30.   CBC with Differential/Platelet     Status: Abnormal   Collection Time: 03/15/16  8:45 AM  Result Value Ref Range   WBC 5.3 3.4 - 10.8 x10E3/uL   RBC 4.14 3.77 - 5.28 x10E6/uL   Hemoglobin 12.2 11.1 - 15.9 g/dL   Hematocrit 37.6 34.0 - 46.6 %   MCV 91 79 - 97 fL   MCH 29.5 26.6 - 33.0 pg   MCHC 32.4 31.5 - 35.7 g/dL   RDW 13.5 12.3 - 15.4 %   Platelets 404 (H) 150 - 379 x10E3/uL   Neutrophils 47 %   Lymphs 42 %   Monocytes 7 %   Eos 3 %   Basos 0 %   Neutrophils Absolute 2.5 1.4 - 7.0 x10E3/uL   Lymphocytes Absolute 2.2 0.7 - 3.1 x10E3/uL   Monocytes Absolute 0.4 0.1 - 0.9 x10E3/uL   EOS (ABSOLUTE) 0.2 0.0 - 0.4 x10E3/uL   Basophils Absolute 0.0 0.0 - 0.2 x10E3/uL   Immature Granulocytes 1 %   Immature Grans (Abs) 0.0 0.0 - 0.1 x10E3/uL  Comprehensive metabolic panel     Status: Abnormal   Collection Time: 03/15/16  8:45 AM  Result Value Ref Range   Glucose 97 65 - 99 mg/dL   BUN 13 6 - 24 mg/dL   Creatinine, Ser 1.02 (H) 0.57 - 1.00 mg/dL   GFR calc non Af Amer 67 >59 mL/min/1.73   GFR calc Af Amer 77 >59 mL/min/1.73   BUN/Creatinine Ratio 13 9 - 23   Sodium 140 134 - 144 mmol/L   Potassium 4.5 3.5 - 5.2 mmol/L   Chloride 100 96 - 106 mmol/L   CO2 27  18 - 29 mmol/L   Calcium 9.2 8.7 - 10.2 mg/dL   Total Protein 6.5 6.0 - 8.5 g/dL   Albumin  4.1 3.5 - 5.5 g/dL   Globulin, Total 2.4 1.5 - 4.5 g/dL   Albumin/Globulin Ratio 1.7 1.2 - 2.2   Bilirubin Total 0.3 0.0 - 1.2 mg/dL   Alkaline Phosphatase 76 39 - 117 IU/L   AST 11 0 - 40 IU/L   ALT 13 0 - 32 IU/L  Hemoglobin A1c     Status: None   Collection Time: 03/15/16  8:45 AM  Result Value Ref Range   Hgb A1c MFr Bld 5.6 4.8 - 5.6 %    Comment:          Pre-diabetes: 5.7 - 6.4          Diabetes: >6.4          Glycemic control for adults with diabetes: <7.0    Est. average glucose Bld gHb Est-mCnc 114 mg/dL  Lipid panel     Status: None   Collection Time: 03/15/16  8:45 AM  Result Value Ref Range   Cholesterol, Total 167 100 - 199 mg/dL   Triglycerides 74 0 - 149 mg/dL   HDL 67 >39 mg/dL   VLDL Cholesterol Cal 15 5 - 40 mg/dL   LDL Calculated 85 0 - 99 mg/dL   Chol/HDL Ratio 2.5 0.0 - 4.4 ratio units    Comment:                                   T. Chol/HDL Ratio                                             Men  Women                               1/2 Avg.Risk  3.4    3.3                                   Avg.Risk  5.0    4.4                                2X Avg.Risk  9.6    7.1                                3X Avg.Risk 23.4   11.0   TSH     Status: None   Collection Time: 03/15/16  8:45 AM  Result Value Ref Range   TSH 1.360 0.450 - 4.500 uIU/mL  Vitamin B12     Status: None   Collection Time: 03/15/16  8:45 AM  Result Value Ref Range   Vitamin B-12 500 211 - 946 pg/mL     PHQ2/9: Depression screen [facility] 2/9 03/15/2016 12/30/2015 09/26/2015 06/27/2015 03/28/2015  Decreased Interest 0 2 0 0 0  Down, Depressed, Hopeless 0 3 0 1 0  PHQ - 2 Score 0 5 0 1 0  Altered sleeping - 3 - - -  Tired, decreased energy -  3 - - -  Change in appetite - 1 - - -  Feeling bad or failure about yourself  - 1 - - -  Trouble concentrating - 2 - - -  Moving slowly or fidgety/restless - 0 - - -  Suicidal thoughts - 0 - - -  PHQ-9 Score - 15 - - -  Difficult doing work/chores - - - - -      Fall Risk: Fall Risk  03/15/2016 12/30/2015 09/26/2015 06/27/2015 03/28/2015  Falls in the past year? Yes No No No No  Number falls in past yr: 1 - - - -  Injury with Fall? No - - - -      Assessment & Plan  1. Multiple sclerosis, relapsing-remitting (HCC)  - amphetamine-dextroamphetamine (ADDERALL XR) 20 MG 24 hr capsule; Take 1 capsule (20 mg total) by mouth every morning.  Dispense: 90 capsule; Refill: 0  2. ADD (attention deficit disorder)  - amphetamine-dextroamphetamine (ADDERALL XR) 20 MG 24 hr capsule; Take 1 capsule (20 mg total) by mouth every morning.  Dispense: 90 capsule; Refill: 0  3. Depression, major, recurrent, mild (HCC)  - amphetamine-dextroamphetamine (ADDERALL XR) 20 MG 24 hr capsule; Take 1 capsule (20 mg total) by mouth every morning.  Dispense: 90 capsule; Refill: 0 - DULoxetine (CYMBALTA) 60 MG capsule; Take 1 capsule (60 mg total) by mouth daily.  Dispense: 90 capsule; Refill: 1  4. Insomnia, persistent  Continue medication, try ear plugs  5. Asthma, mild intermittent, uncomplicated  Continue singulair  6. Overweight (BMI 25.0-29.9)  Discussed with the patient the risk posed by an increased BMI. Discussed importance of portion control, calorie counting and at least 150 minutes of physical activity weekly. Avoid sweet beverages and drink more water. Eat at least 6 servings of fruit and vegetables daily   7. Needs flu shot  - Flu Vaccine QUAD 36+ mos IM

## 2016-06-29 ENCOUNTER — Ambulatory Visit (INDEPENDENT_AMBULATORY_CARE_PROVIDER_SITE_OTHER): Payer: 59 | Admitting: Family Medicine

## 2016-06-29 ENCOUNTER — Encounter: Payer: Self-pay | Admitting: Family Medicine

## 2016-06-29 VITALS — BP 108/62 | HR 97 | Temp 97.8°F | Resp 16 | Wt 193.7 lb

## 2016-06-29 DIAGNOSIS — G4700 Insomnia, unspecified: Secondary | ICD-10-CM | POA: Diagnosis not present

## 2016-06-29 DIAGNOSIS — J302 Other seasonal allergic rhinitis: Secondary | ICD-10-CM | POA: Diagnosis not present

## 2016-06-29 DIAGNOSIS — F902 Attention-deficit hyperactivity disorder, combined type: Secondary | ICD-10-CM | POA: Diagnosis not present

## 2016-06-29 DIAGNOSIS — J3089 Other allergic rhinitis: Secondary | ICD-10-CM

## 2016-06-29 DIAGNOSIS — E663 Overweight: Secondary | ICD-10-CM | POA: Diagnosis not present

## 2016-06-29 DIAGNOSIS — J4520 Mild intermittent asthma, uncomplicated: Secondary | ICD-10-CM | POA: Diagnosis not present

## 2016-06-29 DIAGNOSIS — F330 Major depressive disorder, recurrent, mild: Secondary | ICD-10-CM

## 2016-06-29 DIAGNOSIS — G35 Multiple sclerosis: Secondary | ICD-10-CM

## 2016-06-29 MED ORDER — AMPHETAMINE-DEXTROAMPHET ER 20 MG PO CP24: 20.0000 mg | ORAL_CAPSULE | ORAL | 0 refills | Status: DC

## 2016-06-29 MED ORDER — MONTELUKAST SODIUM 10 MG PO TABS: 10.0000 mg | ORAL_TABLET | Freq: Every day | ORAL | 1 refills | Status: DC

## 2016-06-29 MED ORDER — QUETIAPINE FUMARATE 25 MG PO TABS: 25.0000 mg | ORAL_TABLET | Freq: Every day | ORAL | 0 refills | Status: DC

## 2016-06-29 MED ORDER — FLUTICASONE PROPIONATE 50 MCG/ACT NA SUSP: 2.0000 | Freq: Every day | NASAL | 1 refills | Status: DC

## 2016-06-29 MED ORDER — ZOLPIDEM TARTRATE ER 6.25 MG PO TBCR: 6.2500 mg | EXTENDED_RELEASE_TABLET | Freq: Every evening | ORAL | 0 refills | Status: DC | PRN

## 2016-06-29 NOTE — Progress Notes (Signed)
Name: Robin Chan   MRN: [id number]    DOB: 01/02/1971   Date:06/29/2016       Progress Note  Subjective  Chief Complaint  Chief Complaint  Patient presents with  . ADD    patient is here for her 3 month f/u  . Multiple Sclerosis    patient stated that she has been having issues since september. especially at night. has to adjust to temp change (warm to cold)  . Insomnia  . Depression  . Asthma  . Back Pain  . GI Problem  . Obesity  . Eye Problem    patient stated that she has to use her glasses more than before    HPI  ADD: taking medication daily as prescribed, on Adderal XR 20 mg now and she states she has been feeling well with medication, no side effects. She states Cymbalta also helps with symptoms.   MS: diagnosed in 2000, but first symptoms in 1991 - she felt numb on the right side and weakness. Currently off medication because of multiple side effects. Has intermittent weakness and has used a cane in the past but not need to use it in many months. She still has episodes of left foot drop but able to manage without any assistance. Adderall helps with energy level. She has noticed some spasms intermittently on right arm.   Insomnia: still taking Ambien, she states she is able to fall asleep but still wakes up during the night - at times has difficulty falling back asleep - if it is noisy. No side effects, such as amnesia. She takes it when she gets in bed but takes about one hour to fall asleep.  Discussed FDA changes and we will try Seroquel low dose of Ambien 6.25 and she will call back with the one she would like to continue taking  Major Depression: she was  taking Lexapro for many years and depression was not controlled, we switched to Cymbalta in June 2017 , she states she is feeling well, occasionally has lack of motivation but only occasionally, no crying spells or sadness.  Her daughter is 42 yo and may have autism spectrum disorder, but is adjusting to attending  [employer], she drives her back and forth from school.   Asthma: she is taking singulair daily, she has dry coughing spells. She states usually triggered by getting hot. Resolves once she cools off. She denies wheezing or SOB. She has not used rescue inhaler lately   Low back pain: she has intermittent low back pain, usually a dull aching pain, no radiculitis.   Overweight: she is occasionally taking Herbal life, she is frustrated about not being able to lose weight. She is eating fish, fruit and vegetables.    Patient Active Problem List   Diagnosis Date Noted  . Rectocele 07/08/2015  . Status post laparoscopic assisted vaginal hysterectomy (LAVH) 07/08/2015  . History of hysterectomy for benign disease 05/26/2015  . Left lumbar radiculitis 03/28/2015  . Breast cancer screening 02/05/2015  . Abnormal CAT scan 12/31/2014  . ADD (attention deficit disorder) 12/31/2014  . Allergic to bees 12/31/2014  . Back muscle spasm 12/31/2014  . Insomnia, persistent 12/31/2014  . CN (constipation) 12/31/2014  . Depression, major, recurrent, mild ([place]) 12/31/2014  . Personal history of traumatic fracture 12/31/2014  . Asthma, mild intermittent 12/31/2014  . Migraine with aura and without status migrainosus, not intractable 12/31/2014  . Multiple sclerosis, relapsing-remitting ([place]) 12/31/2014  . Endometriosis 12/31/2014  . Perennial allergic rhinitis  12/31/2014  . Pituitary microadenoma ([place]) 12/31/2014  . Vitamin D deficiency 12/31/2014  . Acquired trigger finger 12/31/2014    Past Surgical History:  Procedure Laterality Date  . ABDOMINAL HYSTERECTOMY  05/26/2015  . BREAST BIOPSY Right 2005   benign  . LAPAROSCOPIC ASSISTED VAGINAL HYSTERECTOMY N/A 05/26/2015   Procedure: LAPAROSCOPIC ASSISTED VAGINAL HYSTERECTOMY, with right salpingectomy;  Surgeon: Brayton Mars, MD;  Location: [facility] ORS;  Service: Gynecology;  Laterality: N/A;  . OVARY SURGERY Left 09/30/2014  . RECTOCELE REPAIR  N/A 05/26/2015   Procedure: POSTERIOR REPAIR (RECTOCELE);  Surgeon: Brayton Mars, MD;  Location: [facility] ORS;  Service: Gynecology;  Laterality: N/A;    Family History  Problem Relation Age of Onset  . Anemia Mother   . Osteoporosis Mother   . Mental illness Father     B[place]olor  . Arthritis Brother   . Breast cancer Maternal Aunt     Social History   Social History  . Marital status: Married    Spouse name: N/A  . Number of children: N/A  . Years of education: N/A   Occupational History  . Not on file.   Social History Main Topics  . Smoking status: Never Smoker  . Smokeless tobacco: Never Used  . Alcohol use No  . Drug use: No  . Sexual activity: Not Currently     Comment: husband has ED   Other Topics Concern  . Not on file   Social History Narrative  . No narrative on file     Current Outpatient Prescriptions:  .  albuterol (PROVENTIL HFA;VENTOLIN HFA) 108 (90 BASE) MCG/ACT inhaler, Inhale 1 puff into the lungs every 6 (six) hours as needed for wheezing or shortness of breath. Reported on 09/26/2015, Disp: , Rfl:  .  amphetamine-dextroamphetamine (ADDERALL XR) 20 MG 24 hr capsule, Take 1 capsule (20 mg total) by mouth every morning., Disp: 90 capsule, Rfl: 0 .  Cholecalciferol (VITAMIN D3) 1000 UNITS CHEW, Chew 2,000 Units by mouth daily. , Disp: , Rfl:  .  cyclobenzaprine (FLEXERIL) 10 MG tablet, Take 1 tablet (10 mg total) by mouth 3 (three) times daily as needed for muscle spasms., Disp: 30 tablet, Rfl: 0 .  DULoxetine (CYMBALTA) 60 MG capsule, Take 1 capsule (60 mg total) by mouth daily., Disp: 90 capsule, Rfl: 1 .  EPINEPHrine 0.3 mg/0.3 mL IJ SOAJ injection, Inject 0.3 mg into the muscle once. Reported on 09/26/2015, Disp: , Rfl:  .  fluticasone (FLONASE) 50 MCG/ACT nasal spray, Place 2 sprays into both nostrils daily., Disp: 48 g, Rfl: 1 .  levocetirizine (XYZAL) 5 MG tablet, Take 1 tablet (5 mg total) by mouth every evening., Disp: 90 tablet, Rfl: 4 .   montelukast (SINGULAIR) 10 MG tablet, Take 1 tablet (10 mg total) by mouth at bedtime., Disp: 90 tablet, Rfl: 1 .  naproxen (NAPROSYN) 500 MG tablet, Take 1 tablet (500 mg total) by mouth 2 (two) times daily with a meal., Disp: 60 tablet, Rfl: 0 .  Omega-3 Fatty Acids (FISH OIL) 1000 MG CAPS, Take by mouth., Disp: , Rfl:  .  polyethylene glycol powder (GLYCOLAX/MIRALAX) powder, Take 17 g by mouth daily., Disp: 3350 g, Rfl: 0 .  QUEtiapine (SEROQUEL) 25 MG tablet, Take 1 tablet (25 mg total) by mouth at bedtime., Disp: 30 tablet, Rfl: 0 .  zolpidem (AMBIEN CR) 6.25 MG CR tablet, Take 1 tablet (6.25 mg total) by mouth at bedtime as needed for sleep., Disp: 30 tablet, Rfl: 0  Allergies  Allergen Reactions  . Penicillins Anaphylaxis  . Bee Venom     Bee     ROS  Constitutional: Negative for fever or weight change.  Respiratory: Negative for cough and shortness of breath.   Cardiovascular: Negative for chest pain or palpitations.  Gastrointestinal: Negative for abdominal pain, no bowel changes.  She is worried that she is not losing weight because of her gut Musculoskeletal: Positive  for gait problem but no  joint swelling.  Skin: Negative for rash.  Neurological: Negative for dizziness or headache.  No other specific complaints in a complete review of systems (except as listed in HPI above).  Objective  Vitals:   06/29/16 0745  BP: 108/62  Pulse: 97  Resp: 16  Temp: 97.8 F (36.6 C)  TempSrc: Oral  SpO2: 98%  Weight: 193 lb 11.2 oz (87.9 kg)    Body mass index is 28.19 kg/m.  Physical Exam  Constitutional: Patient appears well-developed and well-nourished. Obese  No distress.  HEENT: head atraumatic, normocephalic, pupils equal and reactive to light,  neck supple, throat within normal limits Cardiovascular: Normal rate, regular rhythm and normal heart sounds.  No murmur heard. No BLE edema. Pulmonary/Chest: Effort normal and breath sounds normal. No respiratory  distress. Abdominal: Soft.  There is no tenderness. Psychiatric: Patient has a normal mood and affect. behavior is normal. Judgment and thought content normal.  PHQ2/9: Depression screen [facility] 2/9 06/29/2016 03/15/2016 12/30/2015 09/26/2015 06/27/2015  Decreased Interest 1 0 2 0 0  Down, Depressed, Hopeless 0 0 3 0 1  PHQ - 2 Score 1 0 5 0 1  Altered sleeping - - 3 - -  Tired, decreased energy - - 3 - -  Change in appetite - - 1 - -  Feeling bad or failure about yourself  - - 1 - -  Trouble concentrating - - 2 - -  Moving slowly or fidgety/restless - - 0 - -  Suicidal thoughts - - 0 - -  PHQ-9 Score - - 15 - -  Difficult doing work/chores - - - - -     Fall Risk: Fall Risk  03/15/2016 12/30/2015 09/26/2015 06/27/2015 03/28/2015  Falls in the past year? Yes No No No No  Number falls in past yr: 1 - - - -  Injury with Fall? No - - - -      Assessment & Plan  1. Attention deficit hyperactivity disorder (ADHD), combined type  - amphetamine-dextroamphetamine (ADDERALL XR) 20 MG 24 hr capsule; Take 1 capsule (20 mg total) by mouth every morning.  Dispense: 90 capsule; Refill: 0  2. Multiple sclerosis, relapsing-remitting (HCC)  - amphetamine-dextroamphetamine (ADDERALL XR) 20 MG 24 hr capsule; Take 1 capsule (20 mg total) by mouth every morning.  Dispense: 90 capsule; Refill: 0  3. Depression, major, recurrent, mild (HCC)  - amphetamine-dextroamphetamine (ADDERALL XR) 20 MG 24 hr capsule; Take 1 capsule (20 mg total) by mouth every morning.  Dispense: 90 capsule; Refill: 0  4. Insomnia, persistent  - zolpidem (AMBIEN CR) 6.25 MG CR tablet; Take 1 tablet (6.25 mg total) by mouth at bedtime as needed for sleep.  Dispense: 30 tablet; Refill: 0 - QUEtiapine (SEROQUEL) 25 MG tablet; Take 1 tablet (25 mg total) by mouth at bedtime.  Dispense: 30 tablet; Refill: 0  5. Perennial allergic rhinitis with seasonal variation  - fluticasone (FLONASE) 50 MCG/ACT nasal spray; Place 2 sprays into both  nostrils daily.  Dispense: 48 g; Refill: 1  6. Overweight (BMI 25.0-29.9)  Discussed with the patient the risk posed by an increased BMI. Discussed importance of portion control, calorie counting and at least 150 minutes of physical activity weekly. Avoid sweet beverages and drink more water. Eat at least 6 servings of fruit and vegetables daily   7. Mild intermittent asthma without complication  Doing well at this time

## 2016-06-30 ENCOUNTER — Ambulatory Visit
Admission: RE | Admit: 2016-06-30 | Discharge: 2016-06-30 | Disposition: A | Discharge status: Home / Self Care | Payer: 59 | Source: Ambulatory Visit | Attending: Family Medicine | Admitting: Family Medicine

## 2016-06-30 DIAGNOSIS — Z1231 Encounter for screening mammogram for malignant neoplasm of breast: Secondary | ICD-10-CM | POA: Diagnosis present

## 2016-06-30 DIAGNOSIS — Z1239 Encounter for other screening for malignant neoplasm of breast: Secondary | ICD-10-CM

## 2016-07-27 ENCOUNTER — Ambulatory Visit (INDEPENDENT_AMBULATORY_CARE_PROVIDER_SITE_OTHER): Payer: 59 | Admitting: Obstetrics and Gynecology

## 2016-07-27 VITALS — BP 133/90 | HR 111 | Ht 69.5 in | Wt 194.3 lb

## 2016-07-27 DIAGNOSIS — K5900 Constipation, unspecified: Secondary | ICD-10-CM | POA: Diagnosis not present

## 2016-07-27 DIAGNOSIS — R102 Pelvic and perineal pain: Secondary | ICD-10-CM | POA: Diagnosis not present

## 2016-07-27 DIAGNOSIS — N809 Endometriosis, unspecified: Secondary | ICD-10-CM | POA: Diagnosis not present

## 2016-07-27 NOTE — Patient Instructions (Signed)
1. Return in 6 months for follow-up 2. Naproxen sodium 2 tablets twice a day as needed for pain relief

## 2016-07-27 NOTE — Progress Notes (Signed)
Chief complaint: 1. Chronic constipation 2. History of endometriosis 3. Left lower quadrant pain  Adysin presents for follow-up. Bowel function is regular; she is using MiraLAX to facilitate bowel movements; she has discontinued stool softeners. Left lower quadrant discomfort does occur periodically several times a month; naproxen sodium as needed for pain control. She is status post hysterectomy with left salpingo-oophorectomy. She is not experiencing any right lower quadrant pain.  OBJECTIVE: BP 133/90   Pulse (!) 111   Ht 5' 9.5" (1.765 m)   Wt 194 lb 4.8 oz (88.1 kg)   LMP 05/19/2015 Comment: upreg neg  BMI 28.28 kg/m  Abdomen: Soft, nontender Pelvic exam: External genitalia-normal BUS-normal Vagina-good vault support; vaginal cuff intact; no significant masses; left lower quadrant tenderness 1/4; no guarding Rectal exam-deferred  ASSESSMENT: 1. History of endometriosis, status post hysterectomy left salpingo-oophorectomy 2. History of MS 3. Chronic constipation, managed with MiraLAX 4. Left lower quadrant pain, intermittent, of unclear etiology, possibly scar tissue versus residual endometriosis implants  PLAN: 1. Managed conservatively with no hormonal therapy at this time 2. Continue using naproxen sodium for symptomatic treatment several times a month 3. If symptoms worsen or occur with increased frequency, return for follow-up and further management planning for possible empiric endometriosis hormonal therapy.  A total of 15 minutes were spent face-to-face with the patient during this encounter and over half of that time dealt with counseling and coordination of care.  Brayton Mars, MD  Note: This dictation was prepared with Dragon dictation along with smaller phrase technology. Any transcriptional errors that result from this process are unintentional.

## 2016-07-28 ENCOUNTER — Other Ambulatory Visit: Payer: Self-pay | Admitting: Family Medicine

## 2016-07-28 DIAGNOSIS — G4700 Insomnia, unspecified: Secondary | ICD-10-CM

## 2016-07-28 MED ORDER — QUETIAPINE FUMARATE 25 MG PO TABS: 25.0000 mg | ORAL_TABLET | Freq: Every day | ORAL | 0 refills | Status: DC

## 2016-09-23 ENCOUNTER — Encounter: Payer: Self-pay | Admitting: Family Medicine

## 2016-09-23 ENCOUNTER — Ambulatory Visit (INDEPENDENT_AMBULATORY_CARE_PROVIDER_SITE_OTHER): Payer: 59 | Admitting: Family Medicine

## 2016-09-23 VITALS — BP 122/68 | HR 94 | Temp 98.0°F | Resp 18 | Ht 70.0 in | Wt 190.0 lb

## 2016-09-23 DIAGNOSIS — F330 Major depressive disorder, recurrent, mild: Secondary | ICD-10-CM

## 2016-09-23 DIAGNOSIS — J4520 Mild intermittent asthma, uncomplicated: Secondary | ICD-10-CM | POA: Diagnosis not present

## 2016-09-23 DIAGNOSIS — G35 Multiple sclerosis: Secondary | ICD-10-CM

## 2016-09-23 DIAGNOSIS — G4700 Insomnia, unspecified: Secondary | ICD-10-CM

## 2016-09-23 DIAGNOSIS — F902 Attention-deficit hyperactivity disorder, combined type: Secondary | ICD-10-CM | POA: Diagnosis not present

## 2016-09-23 MED ORDER — DULOXETINE HCL 60 MG PO CPEP: 60.0000 mg | ORAL_CAPSULE | Freq: Every day | ORAL | 1 refills | Status: DC

## 2016-09-23 MED ORDER — QUETIAPINE FUMARATE 25 MG PO TABS: 25.0000 mg | ORAL_TABLET | Freq: Every day | ORAL | 1 refills | Status: DC

## 2016-09-23 MED ORDER — AMPHETAMINE-DEXTROAMPHET ER 20 MG PO CP24: 20.0000 mg | ORAL_CAPSULE | ORAL | 0 refills | Status: DC

## 2016-09-23 NOTE — Progress Notes (Signed)
Name: Robin Chan   MRN: [id number]    DOB: [birth year]   Date:09/23/2016       Progress Note  Subjective  Chief Complaint  Chief Complaint  Patient presents with  . Medication Refill    3 month F/U  . ADHD  . Insomnia    Sleeps better during the day  . Depression    Has been having more panic attacks, has them occasionally  . Asthma    Been controlled with allergy medication    HPI  ADD: taking medication daily as prescribed, on Adderal XR 20 mg now and she states she has been feeling well with medication, no side effects. She states Cymbalta also helps with symptoms. She states that it helps her focus and stay awake also  MS: diagnosed in 2000, but first symptoms in 1991 - she felt numb on the right side and weakness. Currently off medication because of multiple side effects. Has intermittent weakness and has used a cane in the past but not need to use in recent months.  She still has episodes of left foot drop but able to manage without any assistance. Adderall helps with energy level.  Insomnia: she has been off Ambien since last visit, she is taking Seroquel low dose qhs and helps her fall asleep ( takes about 10-20 minutes ) and it helps her stay asleep. No longer napping in am's. Taking daughter to school and walking and it has helped her lose weight.   Major Depression: she was taking Lexapro for many years and depression was not controlled, we switched to Cymbalta in June 2017 , she states she was  feeling well, however she felt down this past Tuesday, but  no crying spells or sadness.   Asthma: she is taking singulair daily, she has dry coughing spells. She states usually triggered by getting hot. Resolves once she cools off. She denies wheezing or SOB. She has not used rescue inhaler lately.  Low back pain: she has intermittent low back pain, usually a dull aching pain, no radiculitis.   Overweight: she has been walking more and has lost weight, she is happy about  it.   Patient Active Problem List   Diagnosis Date Noted  . Rectocele 07/08/2015  . Status post laparoscopic assisted vaginal hysterectomy (LAVH) 07/08/2015  . History of hysterectomy for benign disease 05/26/2015  . Left lumbar radiculitis 03/28/2015  . Breast cancer screening 02/05/2015  . Abnormal CAT scan 12/31/2014  . ADD (attention deficit disorder) 12/31/2014  . Allergic to bees 12/31/2014  . Back muscle spasm 12/31/2014  . Insomnia, persistent 12/31/2014  . CN (constipation) 12/31/2014  . Depression, major, recurrent, mild ([place]) 12/31/2014  . Personal history of traumatic fracture 12/31/2014  . Asthma, mild intermittent 12/31/2014  . Migraine with aura and without status migrainosus, not intractable 12/31/2014  . Multiple sclerosis, relapsing-remitting ([place]) 12/31/2014  . Endometriosis 12/31/2014  . Perennial allergic rhinitis 12/31/2014  . Pituitary microadenoma ([place]) 12/31/2014  . Vitamin D deficiency 12/31/2014  . Acquired trigger finger 12/31/2014    Past Surgical History:  Procedure Laterality Date  . ABDOMINAL HYSTERECTOMY  05/26/2015  . BREAST BIOPSY Right 2005   benign  . LAPAROSCOPIC ASSISTED VAGINAL HYSTERECTOMY N/A 05/26/2015   Procedure: LAPAROSCOPIC ASSISTED VAGINAL HYSTERECTOMY, with right salpingectomy;  Surgeon: Brayton Mars, MD;  Location: [facility] ORS;  Service: Gynecology;  Laterality: N/A;  . OVARY SURGERY Left 09/30/2014  . RECTOCELE REPAIR N/A 05/26/2015   Procedure: POSTERIOR REPAIR (RECTOCELE);  Surgeon:  Brayton Mars, MD;  Location: [facility] ORS;  Service: Gynecology;  Laterality: N/A;    Family History  Problem Relation Age of Onset  . Anemia Mother   . Osteoporosis Mother   . Mental illness Father     [place]  . Arthritis Brother   . Breast cancer Maternal Aunt     Social History   Social History  . Marital status: Married    Spouse name: N/A  . Number of children: N/A  . Years of education: N/A   Occupational History   . Not on file.   Social History Main Topics  . Smoking status: Never Smoker  . Smokeless tobacco: Never Used  . Alcohol use No  . Drug use: No  . Sexual activity: Not Currently     Comment: husband has ED   Other Topics Concern  . Not on file   Social History Narrative  . No narrative on file     Current Outpatient Prescriptions:  .  albuterol (PROVENTIL HFA;VENTOLIN HFA) 108 (90 BASE) MCG/ACT inhaler, Inhale 1 puff into the lungs every 6 (six) hours as needed for wheezing or shortness of breath. Reported on 09/26/2015, Disp: , Rfl:  .  amphetamine-dextroamphetamine (ADDERALL XR) 20 MG 24 hr capsule, Take 1 capsule (20 mg total) by mouth every morning., Disp: 90 capsule, Rfl: 0 .  Cetirizine HCl 10 MG CAPS, Take by mouth., Disp: , Rfl:  .  Cholecalciferol (VITAMIN D3) 1000 UNITS CHEW, Chew 2,000 Units by mouth daily. , Disp: , Rfl:  .  DULoxetine (CYMBALTA) 60 MG capsule, Take 1 capsule (60 mg total) by mouth daily., Disp: 90 capsule, Rfl: 1 .  EPINEPHrine 0.3 mg/0.3 mL IJ SOAJ injection, Inject 0.3 mg into the muscle once. Reported on 09/26/2015, Disp: , Rfl:  .  fluticasone (FLONASE) 50 MCG/ACT nasal spray, Place 2 sprays into both nostrils daily., Disp: 48 g, Rfl: 1 .  levocetirizine (XYZAL) 5 MG tablet, Take 1 tablet (5 mg total) by mouth every evening., Disp: 90 tablet, Rfl: 4 .  montelukast (SINGULAIR) 10 MG tablet, Take 1 tablet (10 mg total) by mouth at bedtime., Disp: 90 tablet, Rfl: 1 .  naproxen (NAPROSYN) 500 MG tablet, Take 1 tablet (500 mg total) by mouth 2 (two) times daily with a meal., Disp: 60 tablet, Rfl: 0 .  Omega-3 Fatty Acids (FISH OIL) 1000 MG CAPS, Take by mouth., Disp: , Rfl:  .  polyethylene glycol powder (GLYCOLAX/MIRALAX) powder, Take 17 g by mouth daily., Disp: 3350 g, Rfl: 0 .  QUEtiapine (SEROQUEL) 25 MG tablet, Take 1 tablet (25 mg total) by mouth at bedtime., Disp: 90 tablet, Rfl: 1  Allergies  Allergen Reactions  . Penicillins Anaphylaxis  . Bee  Venom     Bee     ROS  Constitutional: Negative for fever, positive for  weight change.  Respiratory: Negative for cough and shortness of breath.   Cardiovascular: Negative for chest pain or palpitations.  Gastrointestinal: Negative for abdominal pain, no bowel changes.  Musculoskeletal: Positive  For intermittent  gait problem or joint swelling.  Skin: Negative for rash.  Neurological: Negative for dizziness or headache.  No other specific complaints in a complete review of systems (except as listed in HPI above).  Objective  Vitals:   09/23/16 1339  BP: 122/68  Pulse: 94  Resp: 18  Temp: 98 F (36.7 C)  TempSrc: Oral  SpO2: 96%  Weight: 190 lb (86.2 kg)  Height: 5\' 10"  (1.778 m)  Body mass index is 27.26 kg/m.  Physical Exam   Constitutional: Patient appears well-developed and well-nourished. Obese  No distress.  HEENT: head atraumatic, normocephalic, pupils equal and reactive to light,  neck supple, throat within normal limits Cardiovascular: Normal rate, regular rhythm and normal heart sounds.  No murmur heard. No BLE edema. Pulmonary/Chest: Effort normal and breath sounds normal. No respiratory distress. Abdominal: Soft.  There is no tenderness. Psychiatric: Patient has a normal mood and affect. behavior is normal. Judgment and thought content normal. Muscular Skeletal: walking without a cane today   PHQ2/9: Depression screen [facility] 2/9 09/23/2016 06/29/2016 03/15/2016 12/30/2015 09/26/2015  Decreased Interest 1 1 0 2 0  Down, Depressed, Hopeless 1 0 0 3 0  PHQ - 2 Score 2 1 0 5 0  Altered sleeping 0 - - 3 -  Tired, decreased energy 0 - - 3 -  Change in appetite 0 - - 1 -  Feeling bad or failure about yourself  0 - - 1 -  Trouble concentrating 1 - - 2 -  Moving slowly or fidgety/restless 1 - - 0 -  Suicidal thoughts 0 - - 0 -  PHQ-9 Score 4 - - 15 -  Difficult doing work/chores Somewhat difficult - - - -     Fall Risk: Fall Risk  09/23/2016 03/15/2016 12/30/2015  09/26/2015 06/27/2015  Falls in the past year? No Yes No No No  Number falls in past yr: - 1 - - -  Injury with Fall? - No - - -    Functional Status Survey: Is the patient deaf or have difficulty hearing?: No Does the patient have difficulty seeing, even when wearing glasses/contacts?: No Does the patient have difficulty concentrating, remembering, or making decisions?: No Does the patient have difficulty walking or climbing stairs?: No Does the patient have difficulty dressing or bathing?: No Does the patient have difficulty doing errands alone such as visiting a doctor's office or shopping?: No   Assessment & Plan  1. Attention deficit hyperactivity disorder (ADHD), combined type  Doing well at this time - amphetamine-dextroamphetamine (ADDERALL XR) 20 MG 24 hr capsule; Take 1 capsule (20 mg total) by mouth every morning.  Dispense: 90 capsule; Refill: 0  2. Multiple sclerosis, relapsing-remitting (HCC)  - amphetamine-dextroamphetamine (ADDERALL XR) 20 MG 24 hr capsule; Take 1 capsule (20 mg total) by mouth every morning.  Dispense: 90 capsule; Refill: 0  3. Depression, major, recurrent, mild (HCC)  - DULoxetine (CYMBALTA) 60 MG capsule; Take 1 capsule (60 mg total) by mouth daily.  Dispense: 90 capsule; Refill: 1 - amphetamine-dextroamphetamine (ADDERALL XR) 20 MG 24 hr capsule; Take 1 capsule (20 mg total) by mouth every morning.  Dispense: 90 capsule; Refill: 0 She had some down days lately, but she does not want to change her regiment at this time, she wants to hold off until next visit   4. Insomnia, persistent  Off Ambien and doing better on Seroquel  - QUEtiapine (SEROQUEL) 25 MG tablet; Take 1 tablet (25 mg total) by mouth at bedtime.  Dispense: 90 tablet; Refill: 1  5. Mild intermittent asthma without complication  Advised spirometry and consider starting an inhaled corticosteroids, but she wants to hold off at this time

## 2016-09-27 ENCOUNTER — Ambulatory Visit: Payer: 59 | Admitting: Family Medicine

## 2016-10-07 ENCOUNTER — Encounter: Payer: Self-pay | Admitting: Family Medicine

## 2016-10-08 ENCOUNTER — Other Ambulatory Visit: Payer: Self-pay | Admitting: Family Medicine

## 2016-10-08 DIAGNOSIS — G4700 Insomnia, unspecified: Secondary | ICD-10-CM

## 2016-10-08 DIAGNOSIS — F330 Major depressive disorder, recurrent, mild: Secondary | ICD-10-CM

## 2016-10-08 MED ORDER — QUETIAPINE FUMARATE 25 MG PO TABS: 25.0000 mg | ORAL_TABLET | Freq: Every day | ORAL | 1 refills | Status: DC

## 2016-10-08 MED ORDER — DULOXETINE HCL 60 MG PO CPEP: 60.0000 mg | ORAL_CAPSULE | Freq: Every day | ORAL | 1 refills | Status: DC

## 2016-10-15 ENCOUNTER — Other Ambulatory Visit: Payer: Self-pay | Admitting: Family Medicine

## 2016-10-15 DIAGNOSIS — J3089 Other allergic rhinitis: Principal | ICD-10-CM

## 2016-10-15 DIAGNOSIS — J302 Other seasonal allergic rhinitis: Secondary | ICD-10-CM

## 2017-01-05 ENCOUNTER — Encounter: Payer: Self-pay | Admitting: Family Medicine

## 2017-01-05 ENCOUNTER — Ambulatory Visit (INDEPENDENT_AMBULATORY_CARE_PROVIDER_SITE_OTHER): Payer: 59 | Admitting: Family Medicine

## 2017-01-05 VITALS — BP 120/74 | HR 88 | Temp 98.1°F | Resp 16 | Ht 70.0 in | Wt 192.6 lb

## 2017-01-05 DIAGNOSIS — E663 Overweight: Secondary | ICD-10-CM | POA: Diagnosis not present

## 2017-01-05 DIAGNOSIS — F902 Attention-deficit hyperactivity disorder, combined type: Secondary | ICD-10-CM

## 2017-01-05 DIAGNOSIS — F330 Major depressive disorder, recurrent, mild: Secondary | ICD-10-CM | POA: Diagnosis not present

## 2017-01-05 DIAGNOSIS — J302 Other seasonal allergic rhinitis: Secondary | ICD-10-CM

## 2017-01-05 DIAGNOSIS — G35 Multiple sclerosis: Secondary | ICD-10-CM | POA: Diagnosis not present

## 2017-01-05 DIAGNOSIS — G4700 Insomnia, unspecified: Secondary | ICD-10-CM

## 2017-01-05 DIAGNOSIS — J3089 Other allergic rhinitis: Secondary | ICD-10-CM

## 2017-01-05 DIAGNOSIS — J4520 Mild intermittent asthma, uncomplicated: Secondary | ICD-10-CM | POA: Diagnosis not present

## 2017-01-05 MED ORDER — AMPHETAMINE-DEXTROAMPHET ER 20 MG PO CP24: 20.0000 mg | ORAL_CAPSULE | ORAL | 0 refills | Status: DC

## 2017-01-05 MED ORDER — LEVOCETIRIZINE DIHYDROCHLORIDE 5 MG PO TABS: 5.0000 mg | ORAL_TABLET | Freq: Every evening | ORAL | 4 refills | Status: DC

## 2017-01-05 MED ORDER — FLUTICASONE PROPIONATE 50 MCG/ACT NA SUSP: 2.0000 | Freq: Every day | NASAL | 1 refills | Status: DC

## 2017-01-05 NOTE — Progress Notes (Signed)
Name: Robin Chan   MRN: [id number]    DOB: 18-Jul-1971   Date:01/05/2017       Progress Note  Subjective  Chief Complaint  Chief Complaint  Patient presents with  . Medication Refill    3 month F/U  . Multiple Sclerosis  . ADHD  . Insomnia    Taking Seroquel nightly and not having the morning grogginess as bad as before.   . Depression  . Asthma    Well controlled  . Allergic Rhinitis     Needs refill    HPI    ADD: taking medication daily as prescribed, on Adderal XR 20 mg now and she states she has been feeling well with medication, no side effects. She states Cymbalta also helps with symptoms. She states that it helps her focus and stay awake.   MS: diagnosed in 2000, but first symptoms in 1991 - she felt numb on the right side and weakness. Currently off medication because of multiple side effects. Has intermittent weakness and has used a cane in the past but not need to use in recent months.  She still has episodes of left foot drop and sometimes she uses a cane. Adderall helps with energy level.  Insomnia: she has been off Ambien since last visit, she is taking Seroquel low dose qhs and helps her fall asleep ( takes about 10-20 minutes ) and it helps her stay asleep. She staying up late, goes to bed at 2 am and sleeps in until 9-10 am during the Summer, but better routine when she takes her daughter Apolonio Schneiders ) to school in [place] - she does not drive  Major Depression: she was taking Lexapro for many years and depression was not controlled, we switched to Cymbalta in June 2017 , she states she was feeling well, however she is feeling lost recently. Kids are growing up, she would like to get a job, over the past few weeks marriage has been rocky, they discussed separation, but are now getting marital counselor.   Asthma: she is taking singulair daily, she has dry coughing spells. She states usually triggered by getting hot. Resolves once she cools off. She denies  wheezing or SOB. She has not used rescue inhaler lately.  Overweight: her weight is stable, she is not walking as much lately, but will resume it again.   AR: she has been taking medication and denies rhinorrhea or nasal congestion at this time.  Patient Active Problem List   Diagnosis Date Noted  . Rectocele 07/08/2015  . Status post laparoscopic assisted vaginal hysterectomy (LAVH) 07/08/2015  . History of hysterectomy for benign disease 05/26/2015  . Left lumbar radiculitis 03/28/2015  . Breast cancer screening 02/05/2015  . Abnormal CAT scan 12/31/2014  . ADD (attention deficit disorder) 12/31/2014  . Allergic to bees 12/31/2014  . Back muscle spasm 12/31/2014  . Insomnia, persistent 12/31/2014  . CN (constipation) 12/31/2014  . Depression, major, recurrent, mild ([place]) 12/31/2014  . Personal history of traumatic fracture 12/31/2014  . Asthma, mild intermittent 12/31/2014  . Migraine with aura and without status migrainosus, not intractable 12/31/2014  . Multiple sclerosis, relapsing-remitting ([place]) 12/31/2014  . Endometriosis 12/31/2014  . Perennial allergic rhinitis 12/31/2014  . Pituitary microadenoma ([place]) 12/31/2014  . Vitamin D deficiency 12/31/2014  . Acquired trigger finger 12/31/2014    Past Surgical History:  Procedure Laterality Date  . ABDOMINAL HYSTERECTOMY  05/26/2015  . BREAST BIOPSY Right 2005   benign  . LAPAROSCOPIC ASSISTED VAGINAL  HYSTERECTOMY N/A 05/26/2015   Procedure: LAPAROSCOPIC ASSISTED VAGINAL HYSTERECTOMY, with right salpingectomy;  Surgeon: Brayton Mars, MD;  Location: [facility] ORS;  Service: Gynecology;  Laterality: N/A;  . OVARY SURGERY Left 09/30/2014  . RECTOCELE REPAIR N/A 05/26/2015   Procedure: POSTERIOR REPAIR (RECTOCELE);  Surgeon: Brayton Mars, MD;  Location: [facility] ORS;  Service: Gynecology;  Laterality: N/A;    Family History  Problem Relation Age of Onset  . Anemia Mother   . Osteoporosis Mother   . Mental illness  Father        [place]  . Arthritis Brother   . Breast cancer Maternal Aunt     Social History   Social History  . Marital status: Married    Spouse name: N/A  . Number of children: N/A  . Years of education: N/A   Occupational History  . Not on file.   Social History Main Topics  . Smoking status: Never Smoker  . Smokeless tobacco: Never Used  . Alcohol use No  . Drug use: No  . Sexual activity: Not Currently     Comment: husband has ED   Other Topics Concern  . Not on file   Social History Narrative  . No narrative on file     Current Outpatient Prescriptions:  .  albuterol (PROVENTIL HFA;VENTOLIN HFA) 108 (90 BASE) MCG/ACT inhaler, Inhale 1 puff into the lungs every 6 (six) hours as needed for wheezing or shortness of breath. Reported on 09/26/2015, Disp: , Rfl:  .  amphetamine-dextroamphetamine (ADDERALL XR) 20 MG 24 hr capsule, Take 1 capsule (20 mg total) by mouth every morning., Disp: 90 capsule, Rfl: 0 .  Cetirizine HCl 10 MG CAPS, Take by mouth., Disp: , Rfl:  .  Cholecalciferol (VITAMIN D3) 1000 UNITS CHEW, Chew 2,000 Units by mouth daily. , Disp: , Rfl:  .  DULoxetine (CYMBALTA) 60 MG capsule, Take 1 capsule (60 mg total) by mouth daily., Disp: 90 capsule, Rfl: 1 .  EPINEPHrine 0.3 mg/0.3 mL IJ SOAJ injection, Inject 0.3 mg into the muscle once. Reported on 09/26/2015, Disp: , Rfl:  .  fluticasone (FLONASE) 50 MCG/ACT nasal spray, Place 2 sprays into both nostrils daily., Disp: 48 g, Rfl: 1 .  levocetirizine (XYZAL) 5 MG tablet, Take 1 tablet (5 mg total) by mouth every evening., Disp: 90 tablet, Rfl: 4 .  montelukast (SINGULAIR) 10 MG tablet, Take 1 tablet (10 mg total) by mouth at bedtime., Disp: 90 tablet, Rfl: 1 .  naproxen (NAPROSYN) 500 MG tablet, Take 1 tablet (500 mg total) by mouth 2 (two) times daily with a meal., Disp: 60 tablet, Rfl: 0 .  Omega-3 Fatty Acids (FISH OIL) 1000 MG CAPS, Take by mouth., Disp: , Rfl:  .  polyethylene glycol powder  (GLYCOLAX/MIRALAX) powder, Take 17 g by mouth daily., Disp: 3350 g, Rfl: 0 .  QUEtiapine (SEROQUEL) 25 MG tablet, Take 1 tablet (25 mg total) by mouth at bedtime., Disp: 90 tablet, Rfl: 1  Allergies  Allergen Reactions  . Penicillins Anaphylaxis  . Bee Venom     Bee     ROS  Constitutional: Negative for fever or weight change.  Respiratory: Negative for cough and shortness of breath.   Cardiovascular: Negative for chest pain or palpitations.  Gastrointestinal: Negative for abdominal pain, no bowel changes.  Musculoskeletal: Positive for intermittent gait problems, but no  joint swelling.  Skin: Negative for rash.  Neurological: Negative for dizziness or headache.  No other specific complaints in a complete review  of systems (except as listed in HPI above).  Objective  Vitals:   01/05/17 1127  BP: 120/74  Pulse: 88  Resp: 16  Temp: 98.1 F (36.7 C)  TempSrc: Oral  SpO2: 98%  Weight: 192 lb 9.6 oz (87.4 kg)  Height: 5\' 10"  (1.778 m)    Body mass index is 27.64 kg/m.  Physical Exam  Constitutional: Patient appears well-developed and well-nourished. Overweight No distress.  HEENT: head atraumatic, normocephalic, pupils equal and reactive to light,  neck supple, throat within normal limits Cardiovascular: Normal rate, regular rhythm and normal heart sounds.  No murmur heard. No BLE edema. Pulmonary/Chest: Effort normal and breath sounds normal. No respiratory distress. Abdominal: Soft.  There is no tenderness. Psychiatric: Patient has a normal mood and affect. behavior is normal. Judgment and thought content normal. Neurological: no focal findings.   PHQ2/9: Depression screen [facility] 2/9 01/05/2017 09/23/2016 06/29/2016 03/15/2016 12/30/2015  Decreased Interest 3 1 1  0 2  Down, Depressed, Hopeless 3 1 0 0 3  PHQ - 2 Score 6 2 1  0 5  Altered sleeping 2 0 - - 3  Tired, decreased energy 3 0 - - 3  Change in appetite 2 0 - - 1  Feeling bad or failure about yourself  0 0 - - 1   Trouble concentrating 3 1 - - 2  Moving slowly or fidgety/restless 0 1 - - 0  Suicidal thoughts 1 0 - - 0  PHQ-9 Score 17 4 - - 15  Difficult doing work/chores Very difficult Somewhat difficult - - -     Fall Risk: Fall Risk  01/05/2017 09/23/2016 03/15/2016 12/30/2015 09/26/2015  Falls in the past year? No No Yes No No  Number falls in past yr: - - 1 - -  Injury with Fall? - - No - -     Functional Status Survey: Is the patient deaf or have difficulty hearing?: No Does the patient have difficulty seeing, even when wearing glasses/contacts?: No Does the patient have difficulty concentrating, remembering, or making decisions?: No Does the patient have difficulty walking or climbing stairs?: No Does the patient have difficulty dressing or bathing?: No Does the patient have difficulty doing errands alone such as visiting a doctor's office or shopping?: No    Assessment & Plan  1. Attention deficit hyperactivity disorder (ADHD), combined type  - amphetamine-dextroamphetamine (ADDERALL XR) 20 MG 24 hr capsule; Take 1 capsule (20 mg total) by mouth every morning.  Dispense: 90 capsule; Refill: 0  2. Multiple sclerosis, relapsing-remitting ([facility])  - amphetamine-dextroamphetamine (ADDERALL XR) 20 MG 24 hr capsule; Take 1 capsule (20 mg total) by mouth every morning.  Dispense: 90 capsule; Refill: 0  3. Depression, major, recurrent, mild ([facility])  - amphetamine-dextroamphetamine (ADDERALL XR) 20 MG 24 hr capsule; Take 1 capsule (20 mg total) by mouth every morning.  Dispense: 90 capsule; Refill: 0  She is trying to get a job, she is feeling a little lost in terms of what she will do with her life, advised to continue medication and go to see counselor.   4. Insomnia, persistent  Doing well on Seroquel   5. Mild intermittent asthma without complication  Stable at this time  6. Perennial allergic rhinitis with seasonal variation  - fluticasone (FLONASE) 50 MCG/ACT nasal spray; Place 2  sprays into both nostrils daily.  Dispense: 48 g; Refill: 1 - levocetirizine (XYZAL) 5 MG tablet; Take 1 tablet (5 mg total) by mouth every evening.  Dispense: 90 tablet; Refill:  4  7. Overweight (BMI 25.0-29.9)  Discussed with the patient the risk posed by an increased BMI. Discussed importance of portion control, calorie counting and at least 150 minutes of physical activity weekly. Avoid sweet beverages and drink more water. Eat at least 6 servings of fruit and vegetables daily

## 2017-01-25 ENCOUNTER — Encounter: Payer: 59 | Admitting: Obstetrics and Gynecology

## 2017-03-08 ENCOUNTER — Other Ambulatory Visit: Payer: Self-pay | Admitting: Family Medicine

## 2017-03-08 DIAGNOSIS — F330 Major depressive disorder, recurrent, mild: Secondary | ICD-10-CM

## 2017-03-09 ENCOUNTER — Ambulatory Visit: Payer: 59

## 2017-03-09 ENCOUNTER — Encounter: Payer: 59 | Admitting: Obstetrics and Gynecology

## 2017-03-09 VITALS — BP 118/76 | Ht 69.0 in | Wt 194.9 lb

## 2017-03-09 DIAGNOSIS — Z0289 Encounter for other administrative examinations: Secondary | ICD-10-CM

## 2017-03-09 NOTE — Telephone Encounter (Signed)
Patient requesting refill of Duloxetine [facility].

## 2017-03-10 LAB — CBC WITH DIFFERENTIAL
Basophils Absolute: 0 10*3/uL (ref 0.0–0.2)
Basos: 1 %
EOS (ABSOLUTE): 0.1 10*3/uL (ref 0.0–0.4)
Eos: 2 %
Hematocrit: 37.9 % (ref 34.0–46.6)
Hemoglobin: 12.5 g/dL (ref 11.1–15.9)
Immature Grans (Abs): 0 10*3/uL (ref 0.0–0.1)
Immature Granulocytes: 1 %
Lymphocytes Absolute: 2.6 10*3/uL (ref 0.7–3.1)
Lymphs: 35 %
MCH: 30.6 pg (ref 26.6–33.0)
MCHC: 33 g/dL (ref 31.5–35.7)
MCV: 93 fL (ref 79–97)
Monocytes Absolute: 0.6 10*3/uL (ref 0.1–0.9)
Monocytes: 8 %
Neutrophils Absolute: 4 10*3/uL (ref 1.4–7.0)
Neutrophils: 53 %
RBC: 4.08 x10E6/uL (ref 3.77–5.28)
RDW: 13.8 % (ref 12.3–15.4)
WBC: 7.3 10*3/uL (ref 3.4–10.8)

## 2017-03-10 LAB — COMP. METABOLIC PANEL (12)
AST: 17 IU/L (ref 0–40)
Albumin/Globulin Ratio: 1.9 (ref 1.2–2.2)
Albumin: 4.5 g/dL (ref 3.5–5.5)
Alkaline Phosphatase: 78 IU/L (ref 39–117)
BUN/Creatinine Ratio: 11 (ref 9–23)
BUN: 11 mg/dL (ref 6–24)
Bilirubin Total: 0.4 mg/dL (ref 0.0–1.2)
Calcium: 9.2 mg/dL (ref 8.7–10.2)
Chloride: 100 mmol/L (ref 96–106)
Creatinine, Ser: 0.98 mg/dL (ref 0.57–1.00)
GFR calc Af Amer: 81 mL/min/{1.73_m2} (ref >59)
GFR calc non Af Amer: 70 mL/min/{1.73_m2} (ref >59)
Globulin, Total: 2.4 g/dL (ref 1.5–4.5)
Glucose: 99 mg/dL (ref 65–99)
Potassium: 4.4 mmol/L (ref 3.5–5.2)
Sodium: 139 mmol/L (ref 134–144)
Total Protein: 6.9 g/dL (ref 6.0–8.5)

## 2017-03-10 LAB — LIPID PANEL
Chol/HDL Ratio: 3 ratio (ref 0.0–4.4)
Cholesterol, Total: 184 mg/dL (ref 100–199)
HDL: 62 mg/dL (ref >39)
LDL Calculated: 101 mg/dL — ABNORMAL HIGH (ref 0–99)
Triglycerides: 107 mg/dL (ref 0–149)
VLDL Cholesterol Cal: 21 mg/dL (ref 5–40)

## 2017-03-10 LAB — VITAMIN D 25 HYDROXY (VIT D DEFICIENCY, FRACTURES): Vit D, 25-Hydroxy: 30.1 ng/mL (ref 30.0–100.0)

## 2017-03-10 LAB — HEMOGLOBIN A1C
Est. average glucose Bld gHb Est-mCnc: 111 mg/dL
Hgb A1c MFr Bld: 5.5 % (ref 4.8–5.6)

## 2017-03-10 LAB — INSULIN, 2 HOUR: Insulin, 2 hour: 6 u[IU]/mL (ref 0.0–145.4)

## 2017-03-10 LAB — VITAMIN B12: Vitamin B-12: 297 pg/mL (ref 232–1245)

## 2017-03-16 ENCOUNTER — Encounter: Payer: 59 | Admitting: Obstetrics and Gynecology

## 2017-03-23 ENCOUNTER — Encounter: Payer: Self-pay | Admitting: Obstetrics and Gynecology

## 2017-03-23 ENCOUNTER — Ambulatory Visit: Payer: 59 | Admitting: Obstetrics and Gynecology

## 2017-03-23 VITALS — BP 109/71 | HR 93 | Ht 69.0 in | Wt 196.1 lb

## 2017-03-23 DIAGNOSIS — N809 Endometriosis, unspecified: Secondary | ICD-10-CM

## 2017-03-23 DIAGNOSIS — Z90710 Acquired absence of both cervix and uterus: Secondary | ICD-10-CM

## 2017-03-23 DIAGNOSIS — K5900 Constipation, unspecified: Secondary | ICD-10-CM | POA: Diagnosis not present

## 2017-03-23 MED ORDER — NORETHINDRONE ACETATE 5 MG PO TABS: 15.0000 mg | ORAL_TABLET | Freq: Every day | ORAL | 2 refills | Status: DC

## 2017-03-23 NOTE — Patient Instructions (Signed)
1.  Begin norethindrone acetate 15 mg a day. 2.  Monitor symptoms of left lower quadrant pain and constipation. 3.  Return in 3 months for follow-up

## 2017-03-23 NOTE — Addendum Note (Signed)
Addended by: Elouise Munroe on: 03/23/2017 10:27 AM   Modules accepted: Orders

## 2017-03-23 NOTE — Progress Notes (Signed)
Dragon/transcription Corrupted (note documentation limited). BILLING IS BASED ON TIME  Chief complaint: 1.  Left lower quadrant pain. 2.  Constipation. 3.  History of endometriosis.  Patient presents for follow-up.  She is currently not on any medication for endometriosis.  She is status post LAVH, bilateral salpingectomy with left oophorectomy in the past.  She is noting a slight increased in frequency of recurrence of symptoms of left lower quadrant discomfort with PMS type symptoms, and inability to defecate.  She is not currently using any MiraLAX for bowel function regulation.  She states that when she has onset of the symptoms.  She cannot perform her activities of daily living.  OBJECTIVE: BP 109/71   Pulse 93   Ht 5\' 9"  (1.753 m)   Wt 196 lb 1.6 oz (89 kg)   LMP 05/19/2015   BMI 28.96 kg/m  Pleasant female in no acute distress. Abdomen: Soft, nontender without organomegaly. Pelvic exam: External genitalia have normal BUS-normal. Bimanual-good vault support; no palpable abnormalities; no masses or tenderness. Rectovaginal-normal external exam.  ASSESSMENT: 1.  History of endometriosis, status post LAVH, bilateral salpingectomy, left nephrectomy. 2.  Left lower quadrant pain, intermittent. 3.  Chronic constipation with occasional flares, cyclic.  PLAN: 1.  Trial of norethindrone acetate 50 mg daily 2.  Monitor symptomatology of pain and constipation. 3.  Return in 3 months for follow-up  A total of 15 minutes were spent face-to-face with the patient during this encounter and over half of that time dealt with counseling and coordination of care.  Brayton Mars, MD

## 2017-04-06 ENCOUNTER — Encounter: Payer: Self-pay | Admitting: Family Medicine

## 2017-04-06 ENCOUNTER — Telehealth: Payer: Self-pay

## 2017-04-06 ENCOUNTER — Ambulatory Visit (INDEPENDENT_AMBULATORY_CARE_PROVIDER_SITE_OTHER): Payer: 59 | Admitting: Family Medicine

## 2017-04-06 VITALS — BP 118/72 | HR 86 | Temp 98.4°F | Resp 16 | Ht 69.0 in | Wt 202.3 lb

## 2017-04-06 DIAGNOSIS — Z23 Encounter for immunization: Secondary | ICD-10-CM | POA: Diagnosis not present

## 2017-04-06 DIAGNOSIS — F902 Attention-deficit hyperactivity disorder, combined type: Secondary | ICD-10-CM

## 2017-04-06 DIAGNOSIS — D352 Benign neoplasm of pituitary gland: Secondary | ICD-10-CM | POA: Diagnosis not present

## 2017-04-06 DIAGNOSIS — G4709 Other insomnia: Secondary | ICD-10-CM

## 2017-04-06 DIAGNOSIS — G35 Multiple sclerosis: Secondary | ICD-10-CM | POA: Diagnosis not present

## 2017-04-06 DIAGNOSIS — F330 Major depressive disorder, recurrent, mild: Secondary | ICD-10-CM | POA: Diagnosis not present

## 2017-04-06 DIAGNOSIS — J4520 Mild intermittent asthma, uncomplicated: Secondary | ICD-10-CM

## 2017-04-06 MED ORDER — QUETIAPINE FUMARATE 25 MG PO TABS: 25.0000 mg | ORAL_TABLET | Freq: Every day | ORAL | 1 refills | Status: DC

## 2017-04-06 MED ORDER — AMPHETAMINE-DEXTROAMPHET ER 20 MG PO CP24: 20.0000 mg | ORAL_CAPSULE | ORAL | 0 refills | Status: DC

## 2017-04-06 MED ORDER — MONTELUKAST SODIUM 10 MG PO TABS: 10.0000 mg | ORAL_TABLET | Freq: Every day | ORAL | 1 refills | Status: DC

## 2017-04-06 NOTE — Telephone Encounter (Signed)
Patient was informed that she has been scheduled to have her MRI on Wednesday, April 13, 2017 at 8am. Patient was instructed to arrive at the [facility] entrance at 7:30am.  Patient expressed verbal understanding and said thanks.

## 2017-04-06 NOTE — Addendum Note (Signed)
Addended by: Saunders Glance A on: 04/06/2017 03:48 PM   Modules accepted: Orders

## 2017-04-06 NOTE — Progress Notes (Signed)
Name: Robin Chan   MRN: [id number]    DOB: 10-09-1970   Date:04/06/2017       Progress Note  Subjective  Chief Complaint  Chief Complaint  Patient presents with  . ADD    3 month follow up  . Depression  . Insomnia  . Flu Vaccine    HPI  ADD: taking medication daily as prescribed, on Adderal XR 20 mg now and she states she has been feeling well with medication, no side effects. She states she feels very sluggish without medication. She states that it helps her focus and stay awake.   MS: diagnosed in 2000, but first symptoms in 1991 - she felt numb on the right side and weakness. Currently off medication because of multiple side effects. Has intermittent weakness and has used a cane periodically, for left foot drop - worse when she is tired and when it rains. Adderall helps with energy level. She still has left leg numbness intermittent - can last from hours to days.  Insomnia: she has been off Ambien , we stopped Spring 2018.  She is taking Seroquel low dose qhs and helps her fall asleep ( takes about 10-20 minutes ) and it helps her stay asleep. She is back in a routine now, she is in bed between midnight and 1 am, and gets up around 8. She drives her daughter to [employer]  Major Depression: she was taking Lexapro for many years and depression was not controlled, we switched to Cymbalta in June 2017 , she states she is feeling down because of the grey sky days. Kids are growing up, she would like to get a job, marriage is better since they went to counseling - they are communicating much better  Asthma: she is taking singulair prn, she has dry coughing spells. She states usually triggered by getting hot. Resolves once she cools off. She denies wheezing or SOB. She has not used rescue inhaler lately.  Overweight:she gained weight since last visit, she states she is walking more, and trying to eat healthy  AR: she has been taking medication and denies rhinorrhea or nasal  congestion at this time.   Patient Active Problem List   Diagnosis Date Noted  . Rectocele 07/08/2015  . Status post laparoscopic assisted vaginal hysterectomy (LAVH) 07/08/2015  . History of hysterectomy for benign disease 05/26/2015  . Left lumbar radiculitis 03/28/2015  . Breast cancer screening 02/05/2015  . Abnormal CAT scan 12/31/2014  . ADD (attention deficit disorder) 12/31/2014  . Allergic to bees 12/31/2014  . Back muscle spasm 12/31/2014  . Insomnia, persistent 12/31/2014  . CN (constipation) 12/31/2014  . Depression, major, recurrent, mild ([place]) 12/31/2014  . Personal history of traumatic fracture 12/31/2014  . Asthma, mild intermittent 12/31/2014  . Migraine with aura and without status migrainosus, not intractable 12/31/2014  . Multiple sclerosis, relapsing-remitting ([place]) 12/31/2014  . Endometriosis 12/31/2014  . Perennial allergic rhinitis 12/31/2014  . Pituitary microadenoma ([place]) 12/31/2014  . Vitamin D deficiency 12/31/2014  . Acquired trigger finger 12/31/2014    Past Surgical History:  Procedure Laterality Date  . ABDOMINAL HYSTERECTOMY  05/26/2015  . BREAST BIOPSY Right 2005   benign  . LAPAROSCOPIC ASSISTED VAGINAL HYSTERECTOMY N/A 05/26/2015   Procedure: LAPAROSCOPIC ASSISTED VAGINAL HYSTERECTOMY, with right salpingectomy;  Surgeon: Brayton Mars, MD;  Location: [facility] ORS;  Service: Gynecology;  Laterality: N/A;  . OVARY SURGERY Left 09/30/2014  . RECTOCELE REPAIR N/A 05/26/2015   Procedure: POSTERIOR REPAIR (RECTOCELE);  Surgeon: Hassell Done  A Defrancesco, MD;  Location: [facility] ORS;  Service: Gynecology;  Laterality: N/A;    Family History  Problem Relation Age of Onset  . Anemia Mother   . Osteoporosis Mother   . Mental illness Father        [place]  . Arthritis Brother   . Breast cancer Maternal Aunt     Social History   Social History  . Marital status: Married    Spouse name: N/A  . Number of children: N/A  . Years of education: N/A    Occupational History  . Not on file.   Social History Main Topics  . Smoking status: Never Smoker  . Smokeless tobacco: Never Used  . Alcohol use No  . Drug use: No  . Sexual activity: Not Currently     Comment: husband has ED   Other Topics Concern  . Not on file   Social History Narrative  . No narrative on file     Current Outpatient Prescriptions:  .  albuterol (PROVENTIL HFA;VENTOLIN HFA) 108 (90 BASE) MCG/ACT inhaler, Inhale 1 puff into the lungs every 6 (six) hours as needed for wheezing or shortness of breath. Reported on 09/26/2015, Disp: , Rfl:  .  amphetamine-dextroamphetamine (ADDERALL XR) 20 MG 24 hr capsule, Take 1 capsule (20 mg total) by mouth every morning., Disp: 90 capsule, Rfl: 0 .  Cetirizine HCl 10 MG CAPS, Take by mouth., Disp: , Rfl:  .  DULoxetine (CYMBALTA) 60 MG capsule, TAKE 1 CAPSULE BY MOUTH  DAILY, Disp: 90 capsule, Rfl: 0 .  EPINEPHrine 0.3 mg/0.3 mL IJ SOAJ injection, Inject 0.3 mg into the muscle once. Reported on 09/26/2015, Disp: , Rfl:  .  fluticasone (FLONASE) 50 MCG/ACT nasal spray, Place 2 sprays into both nostrils daily., Disp: 48 g, Rfl: 1 .  levocetirizine (XYZAL) 5 MG tablet, Take 1 tablet (5 mg total) by mouth every evening., Disp: 90 tablet, Rfl: 4 .  montelukast (SINGULAIR) 10 MG tablet, Take 1 tablet (10 mg total) by mouth at bedtime., Disp: 90 tablet, Rfl: 1 .  norethindrone (AYGESTIN) 5 MG tablet, Take 3 tablets (15 mg total) by mouth daily., Disp: 90 tablet, Rfl: 2 .  Omega-3 Fatty Acids (FISH OIL) 1000 MG CAPS, Take by mouth., Disp: , Rfl:  .  polyethylene glycol powder (GLYCOLAX/MIRALAX) powder, Take 17 g by mouth daily., Disp: 3350 g, Rfl: 0 .  QUEtiapine (SEROQUEL) 25 MG tablet, Take 1 tablet (25 mg total) by mouth at bedtime., Disp: 90 tablet, Rfl: 1 .  vitamin B-12 (CYANOCOBALAMIN) 1000 MCG tablet, Take 1,000 mcg by mouth daily., Disp: , Rfl:   Allergies  Allergen Reactions  . Penicillins Anaphylaxis  . Bee Venom     Bee      ROS  Constitutional: Negative for fever, positive weight change.  Respiratory: Negative for cough and shortness of breath.   Cardiovascular: Negative for chest pain or palpitations.  Gastrointestinal: Negative for abdominal pain, no bowel changes.  Musculoskeletal: Positive for intermittent for gait problem no  joint swelling.  Skin: Negative for rash.  Neurological: Negative for dizziness or headache.  No other specific complaints in a complete review of systems (except as listed in HPI above).  Objective  Vitals:   04/06/17 0928  BP: 118/72  Pulse: 86  Resp: 16  Temp: 98.4 F (36.9 C)  SpO2: 95%  Weight: 202 lb 5 oz (91.8 kg)  Height: 5\' 9"  (1.753 m)    Body mass index is 29.88 kg/m.  Physical  Exam  Constitutional: Patient appears well-developed and well-nourished. Overweight. No distress.  HEENT: head atraumatic, normocephalic, pupils equal and reactive to light, neck supple, throat within normal limits Cardiovascular: Normal rate, regular rhythm and normal heart sounds.  No murmur heard. No BLE edema. Pulmonary/Chest: Effort normal and breath sounds normal. No respiratory distress. Abdominal: Soft.  There is no tenderness. Psychiatric: Patient has a normal mood and affect. behavior is normal. Judgment and thought content normal. Neurological : no ataxia, normal gait, normal grip, no focal findings.   Recent Results (from the past 2160 hour(s))  Lipid panel     Status: Abnormal   Collection Time: 03/09/17  9:59 AM  Result Value Ref Range   Cholesterol, Total 184 100 - 199 mg/dL   Triglycerides 107 0 - 149 mg/dL   HDL 62 >39 mg/dL   VLDL Cholesterol Cal 21 5 - 40 mg/dL   LDL Calculated 101 (H) 0 - 99 mg/dL   Chol/HDL Ratio 3.0 0.0 - 4.4 ratio    Comment:                                   T. Chol/HDL Ratio                                             Men  Women                               1/2 Avg.Risk  3.4    3.3                                   Avg.Risk   5.0    4.4                                2X Avg.Risk  9.6    7.1                                3X Avg.Risk 23.4   11.0   Insulin, 2 Hour     Status: None   Collection Time: 03/09/17  9:59 AM  Result Value Ref Range   Insulin, 2 hour 6.0 0.0 - 145.4 uIU/mL  Hemoglobin A1c     Status: None   Collection Time: 03/09/17  9:59 AM  Result Value Ref Range   Hgb A1c MFr Bld 5.5 4.8 - 5.6 %    Comment:          Prediabetes: 5.7 - 6.4          Diabetes: >6.4          Glycemic control for adults with diabetes: <7.0    Est. average glucose Bld gHb Est-mCnc 111 mg/dL  Comp. Metabolic Panel (12)     Status: None   Collection Time: 03/09/17  9:59 AM  Result Value Ref Range   Glucose 99 65 - 99 mg/dL   BUN 11 6 - 24 mg/dL   Creatinine, Ser 0.98 0.57 - 1.00 mg/dL   GFR calc non Af Amer 70 >59 mL/min/1.73   GFR calc  Af Amer 81 >59 mL/min/1.73   BUN/Creatinine Ratio 11 9 - 23   Sodium 139 134 - 144 mmol/L   Potassium 4.4 3.5 - 5.2 mmol/L   Chloride 100 96 - 106 mmol/L   Calcium 9.2 8.7 - 10.2 mg/dL   Total Protein 6.9 6.0 - 8.5 g/dL   Albumin 4.5 3.5 - 5.5 g/dL   Globulin, Total 2.4 1.5 - 4.5 g/dL   Albumin/Globulin Ratio 1.9 1.2 - 2.2   Bilirubin Total 0.4 0.0 - 1.2 mg/dL   Alkaline Phosphatase 78 39 - 117 IU/L   AST 17 0 - 40 IU/L  CBC With Differential     Status: None   Collection Time: 03/09/17  9:59 AM  Result Value Ref Range   WBC 7.3 3.4 - 10.8 x10E3/uL   RBC 4.08 3.77 - 5.28 x10E6/uL   Hemoglobin 12.5 11.1 - 15.9 g/dL   Hematocrit 37.9 34.0 - 46.6 %   MCV 93 79 - 97 fL   MCH 30.6 26.6 - 33.0 pg   MCHC 33.0 31.5 - 35.7 g/dL   RDW 13.8 12.3 - 15.4 %   Neutrophils 53 Not Estab. %   Lymphs 35 Not Estab. %   Monocytes 8 Not Estab. %   Eos 2 Not Estab. %   Basos 1 Not Estab. %   Neutrophils Absolute 4.0 1.4 - 7.0 x10E3/uL   Lymphocytes Absolute 2.6 0.7 - 3.1 x10E3/uL   Monocytes Absolute 0.6 0.1 - 0.9 x10E3/uL   EOS (ABSOLUTE) 0.1 0.0 - 0.4 x10E3/uL   Basophils Absolute 0.0  0.0 - 0.2 x10E3/uL   Immature Granulocytes 1 Not Estab. %   Immature Grans (Abs) 0.0 0.0 - 0.1 x10E3/uL  VITAMIN D 25 Hydroxy (Vit-D Deficiency, Fractures)     Status: None   Collection Time: 03/09/17  9:59 AM  Result Value Ref Range   Vit D, 25-Hydroxy 30.1 30.0 - 100.0 ng/mL    Comment: Vitamin D deficiency has been defined by the Institute of Medicine and an Endocrine Society practice guideline as a level of serum 25-OH vitamin D less than 20 ng/mL (1,2). The Endocrine Society went on to further define vitamin D insufficiency as a level between 21 and 29 ng/mL (2). 1. IOM (Institute of Medicine). 2010. Dietary reference    intakes for calcium and D. [place]: The    [employer]. 2. Holick MF, Binkley , Bischoff-Ferrari HA, et al.    Evaluation, treatment, and prevention of vitamin D    deficiency: an Endocrine Society clinical practice    guideline. JCEM. 2011 Jul; 96(7):1911-30.   Vitamin B12     Status: None   Collection Time: 03/09/17  9:59 AM  Result Value Ref Range   Vitamin B-12 297 232 - 1,245 pg/mL      PHQ2/9: Depression screen [facility] 2/9 04/06/2017 01/05/2017 09/23/2016 06/29/2016 03/15/2016  Decreased Interest 1 3 1 1  0  Down, Depressed, Hopeless 1 3 1  0 0  PHQ - 2 Score 2 6 2 1  0  Altered sleeping 1 2 0 - -  Tired, decreased energy 0 3 0 - -  Change in appetite 0 2 0 - -  Feeling bad or failure about yourself  0 0 0 - -  Trouble concentrating 0 3 1 - -  Moving slowly or fidgety/restless 0 0 1 - -  Suicidal thoughts 0 1 0 - -  PHQ-9 Score 3 17 4  - -  Difficult doing work/chores Somewhat difficult Very difficult Somewhat difficult - -  Fall Risk: Fall Risk  01/05/2017 09/23/2016 03/15/2016 12/30/2015 09/26/2015  Falls in the past year? No No Yes No No  Number falls in past yr: - - 1 - -  Injury with Fall? - - No - -      Assessment & Plan  1. Pituitary microadenoma [facility])  We will repeat MRI, last visit with endo was 2013, last MRI was  2011  2. Need for influenza vaccination  - Flu Vaccine QUAD 6+ mos PF IM (Fluarix Quad PF)  3. Multiple sclerosis, relapsing-remitting (HCC)  - amphetamine-dextroamphetamine (ADDERALL XR) 20 MG 24 hr capsule; Take 1 capsule (20 mg total) by mouth every morning.  Dispense: 90 capsule; Refill: 0  4. Attention deficit hyperactivity disorder (ADHD), combined type  - amphetamine-dextroamphetamine (ADDERALL XR) 20 MG 24 hr capsule; Take 1 capsule (20 mg total) by mouth every morning.  Dispense: 90 capsule; Refill: 0  5. Depression, major, recurrent, mild (HCC)  - amphetamine-dextroamphetamine (ADDERALL XR) 20 MG 24 hr capsule; Take 1 capsule (20 mg total) by mouth every morning.  Dispense: 90 capsule; Refill: 0 She feels worse when rainy and dark outside, she is going to get an appointment with psychologist.   6. Mild intermittent asthma without complication  - montelukast (SINGULAIR) 10 MG tablet; Take 1 tablet (10 mg total) by mouth at bedtime.  Dispense: 90 tablet; Refill: 1  7. Other insomnia  - QUEtiapine (SEROQUEL) 25 MG tablet; Take 1 tablet (25 mg total) by mouth at bedtime.  Dispense: 90 tablet; Refill: 1

## 2017-04-07 ENCOUNTER — Encounter: Payer: Self-pay | Admitting: Obstetrics and Gynecology

## 2017-04-07 ENCOUNTER — Ambulatory Visit (INDEPENDENT_AMBULATORY_CARE_PROVIDER_SITE_OTHER): Payer: 59 | Admitting: Obstetrics and Gynecology

## 2017-04-07 VITALS — BP 127/77 | HR 89 | Ht 69.0 in | Wt 203.8 lb

## 2017-04-07 DIAGNOSIS — N809 Endometriosis, unspecified: Secondary | ICD-10-CM

## 2017-04-07 DIAGNOSIS — Z79899 Other long term (current) drug therapy: Secondary | ICD-10-CM | POA: Insufficient documentation

## 2017-04-07 DIAGNOSIS — K5900 Constipation, unspecified: Secondary | ICD-10-CM

## 2017-04-07 DIAGNOSIS — Z90710 Acquired absence of both cervix and uterus: Secondary | ICD-10-CM

## 2017-04-07 DIAGNOSIS — R102 Pelvic and perineal pain: Secondary | ICD-10-CM | POA: Insufficient documentation

## 2017-04-07 NOTE — Patient Instructions (Addendum)
1. Discontinue norethindrone acetate 15 mg a day 2. Monitor weight trend 3. Monitor left lower quadrant pain and bowel function 4. Return in 6 weeks for follow-up medication management

## 2017-04-07 NOTE — Progress Notes (Signed)
Chief complaint: 1. Chronic constipation and bowel dysfunction 2. History of endometriosis 3. Status post LAVH bilateral salpingectomy left oophorectomy 4. Pelvic pain 5. Follow-up on norethindrone acetate therapy  Patient presents for follow-up 2 weeks after starting the norethindrone acetate. She is taking 15 mg per day. She has noted resolution of her chronic constipation/bowel dysfunction but has subsequently gained 7 pounds in 2 weeks. It has affected her emotionally/psychologically this time and she would like to discontinue the medication.  Past medical history, past surgical history, problem list, medications, and allergies are reviewed  OBJECTIVE: BP 127/77   Pulse 89   Ht 5\' 9"  (1.753 m)   Wt 203 lb 12.8 oz (92.4 kg)   LMP 05/19/2015   BMI 30.10 kg/m  Physical exam-deferred  ASSESSMENT: 1. History of endometriosis, status post LAVH bilateral salpingectomy and left oophorectomy 2. History of recent exacerbation of left lower quadrant pain and bowel dysfunction, thought to be secondary to endometriosis 3. Status post initiation of norethindrone acetate therapy 15 mg a day with subsequent medication side effects of weight gain 4. Emotional lability due to medication side effects  PLAN: 1. Monitor bowel symptoms over the next 6 weeks 2. Monitor pelvic pain symptoms over the next 6 weeks 3. Discontinue norethindrone acetate at this time 4. Return in 6 weeks for follow-up  A total of 15 minutes were spent face-to-face with the patient during this encounter and over half of that time dealt with counseling and coordination of care.  Brayton Mars, MD  Note: This dictation was prepared with Dragon dictation along with smaller phrase technology. Any transcriptional errors that result from this process are unintentional.

## 2017-04-13 ENCOUNTER — Ambulatory Visit
Admission: RE | Admit: 2017-04-13 | Discharge: 2017-04-13 | Disposition: A | Discharge status: Home / Self Care | Payer: 59 | Source: Ambulatory Visit | Attending: Family Medicine | Admitting: Family Medicine

## 2017-04-13 ENCOUNTER — Encounter: Payer: Self-pay | Admitting: Family Medicine

## 2017-04-13 DIAGNOSIS — G35 Multiple sclerosis: Secondary | ICD-10-CM

## 2017-04-13 DIAGNOSIS — D352 Benign neoplasm of pituitary gland: Secondary | ICD-10-CM | POA: Insufficient documentation

## 2017-04-13 MED ORDER — GADOBENATE DIMEGLUMINE 529 MG/ML IV SOLN
10.0000 mL | Freq: Once | INTRAVENOUS | Status: AC | PRN
  Administered 2017-04-13: 9 mL via INTRAVENOUS

## 2017-05-04 ENCOUNTER — Ambulatory Visit (INDEPENDENT_AMBULATORY_CARE_PROVIDER_SITE_OTHER): Payer: 59 | Admitting: Family Medicine

## 2017-05-04 ENCOUNTER — Encounter: Payer: Self-pay | Admitting: Family Medicine

## 2017-05-04 VITALS — BP 118/72 | HR 88 | Temp 98.2°F | Resp 16 | Ht 69.0 in | Wt 202.6 lb

## 2017-05-04 DIAGNOSIS — J302 Other seasonal allergic rhinitis: Secondary | ICD-10-CM | POA: Diagnosis not present

## 2017-05-04 DIAGNOSIS — J3089 Other allergic rhinitis: Secondary | ICD-10-CM

## 2017-05-04 DIAGNOSIS — Z01419 Encounter for gynecological examination (general) (routine) without abnormal findings: Secondary | ICD-10-CM

## 2017-05-04 DIAGNOSIS — Z1239 Encounter for other screening for malignant neoplasm of breast: Secondary | ICD-10-CM

## 2017-05-04 DIAGNOSIS — Z1211 Encounter for screening for malignant neoplasm of colon: Secondary | ICD-10-CM

## 2017-05-04 DIAGNOSIS — E663 Overweight: Secondary | ICD-10-CM

## 2017-05-04 DIAGNOSIS — R7303 Prediabetes: Secondary | ICD-10-CM

## 2017-05-04 MED ORDER — FLUTICASONE PROPIONATE 50 MCG/ACT NA SUSP: 2.0000 | Freq: Every day | NASAL | 1 refills | Status: DC

## 2017-05-04 NOTE — Progress Notes (Signed)
Name: Robin Chan   MRN: [id number]    DOB: 1970/09/19   Date:05/05/2017       Progress Note  Subjective  Chief Complaint  Chief Complaint  Patient presents with  . Annual Exam  . Obesity  . Allergic Rhinitis     HPI  Well woman: she is 46 yo, discussed new colonoscopy recommendation, due for mammogram in Dec, no need for pap smears since had a hysterectomy done in 2016 for benign causes. She has noticed stress incontinence, no hematuria, occasionally still has bladder spasms from endometriosis.   AR: she states symptoms under control with nasal spray, singulair and Xyzal daily, no nasal congestion or rhinorrhea at this time  Overweight: she is very upset about her weight, states not able to lose weight. She is drinking water, avoiding sodas, no fast food. Discussed referral to dietician  Pre-diabetes: she denies polyphagia, polydipsia or polyuria, hgbA1C has improved over the years.   Patient Active Problem List   Diagnosis Date Noted  . Pelvic pain 04/07/2017  . Medication management 04/07/2017  . Rectocele 07/08/2015  . Status post laparoscopic assisted vaginal hysterectomy (LAVH) 07/08/2015  . History of hysterectomy for benign disease 05/26/2015  . Left lumbar radiculitis 03/28/2015  . Breast cancer screening 02/05/2015  . Abnormal CAT scan 12/31/2014  . ADD (attention deficit disorder) 12/31/2014  . Allergic to bees 12/31/2014  . Back muscle spasm 12/31/2014  . Insomnia, persistent 12/31/2014  . Constipation 12/31/2014  . Depression, major, recurrent, mild ([place]) 12/31/2014  . Personal history of traumatic fracture 12/31/2014  . Asthma, mild intermittent 12/31/2014  . Migraine with aura and without status migrainosus, not intractable 12/31/2014  . Multiple sclerosis, relapsing-remitting ([place]) 12/31/2014  . Endometriosis determined by laparoscopy 12/31/2014  . Perennial allergic rhinitis 12/31/2014  . Pituitary microadenoma ([place]) 12/31/2014  . Vitamin D  deficiency 12/31/2014  . Acquired trigger finger 12/31/2014    Past Surgical History:  Procedure Laterality Date  . ABDOMINAL HYSTERECTOMY  05/26/2015  . BREAST BIOPSY Right 2005   benign  . LAPAROSCOPIC ASSISTED VAGINAL HYSTERECTOMY N/A 05/26/2015   Procedure: LAPAROSCOPIC ASSISTED VAGINAL HYSTERECTOMY, with right salpingectomy;  Surgeon: Brayton Mars, MD;  Location: [facility] ORS;  Service: Gynecology;  Laterality: N/A;  . OVARY SURGERY Left 09/30/2014  . RECTOCELE REPAIR N/A 05/26/2015   Procedure: POSTERIOR REPAIR (RECTOCELE);  Surgeon: Brayton Mars, MD;  Location: [facility] ORS;  Service: Gynecology;  Laterality: N/A;    Family History  Problem Relation Age of Onset  . Anemia Mother   . Osteoporosis Mother   . Mental illness Father        Bipolar  . Arthritis Brother   . Breast cancer Maternal Aunt     Social History   Social History  . Marital status: Married    Spouse name: N/A  . Number of children: N/A  . Years of education: N/A   Occupational History  . Not on file.   Social History Main Topics  . Smoking status: Never Smoker  . Smokeless tobacco: Never Used  . Alcohol use 0.0 oz/week     Comment: occasionally  . Drug use: No  . Sexual activity: Yes    Partners: Male    Birth control/ protection: None     Comment: husband has ED, Hysterectomy   Other Topics Concern  . Not on file   Social History Narrative   She is married, both of them born and raised in [place].    Moved to [place]  in 1998 because of husband's job transfer, that is when she stopped working -pending her green-card.   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    She had symptoms of MS in 2000 , daughter was 53 year old and she never went back to work        Current Outpatient Prescriptions:  .  albuterol (PROVENTIL HFA;VENTOLIN HFA) 108 (90 BASE) MCG/ACT inhaler, Inhale 1 puff into the lungs every 6 (six) hours as needed for wheezing or shortness of breath. Reported on 09/26/2015,  Disp: , Rfl:  .  amphetamine-dextroamphetamine (ADDERALL XR) 20 MG 24 hr capsule, Take 1 capsule (20 mg total) by mouth every morning., Disp: 90 capsule, Rfl: 0 .  Cetirizine HCl 10 MG CAPS, Take by mouth., Disp: , Rfl:  .  DULoxetine (CYMBALTA) 60 MG capsule, TAKE 1 CAPSULE BY MOUTH  DAILY, Disp: 90 capsule, Rfl: 0 .  EPINEPHrine 0.3 mg/0.3 mL IJ SOAJ injection, Inject 0.3 mg into the muscle once. Reported on 09/26/2015, Disp: , Rfl:  .  levocetirizine (XYZAL) 5 MG tablet, Take 1 tablet (5 mg total) by mouth every evening., Disp: 90 tablet, Rfl: 4 .  montelukast (SINGULAIR) 10 MG tablet, Take 1 tablet (10 mg total) by mouth at bedtime., Disp: 90 tablet, Rfl: 1 .  polyethylene glycol powder (GLYCOLAX/MIRALAX) powder, Take 17 g by mouth daily., Disp: 3350 g, Rfl: 0 .  QUEtiapine (SEROQUEL) 25 MG tablet, Take 1 tablet (25 mg total) by mouth at bedtime., Disp: 90 tablet, Rfl: 1 .  vitamin B-12 (CYANOCOBALAMIN) 1000 MCG tablet, Take 1,000 mcg by mouth daily., Disp: , Rfl:  .  fluticasone (FLONASE) 50 MCG/ACT nasal spray, Place 2 sprays into both nostrils daily., Disp: 48 g, Rfl: 1 .  Omega-3 Fatty Acids (FISH OIL) 1000 MG CAPS, Take by mouth., Disp: , Rfl:   Allergies  Allergen Reactions  . Penicillins Anaphylaxis  . Bee Venom     Bee     ROS  Constitutional: Negative for fever or weight change.  Respiratory: Negative for cough and shortness of breath.   Cardiovascular: Negative for chest pain or palpitations.  Gastrointestinal: Negative for abdominal pain, no bowel changes.  Musculoskeletal: Positive  for gait problem or joint swelling.  Skin: Negative for rash.  Neurological: Negative for dizziness or headache.  No other specific complaints in a complete review of systems (except as listed in HPI above).  Objective  Vitals:   05/04/17 1410 05/04/17 1453  BP: 118/72   Pulse: (!) 113 88  Resp: 16   Temp: 98.2 F (36.8 C)   TempSrc: Oral   SpO2: 98%   Weight: 202 lb 9.6 oz (91.9  kg)   Height: 5\' 9"  (1.753 m)     Body mass index is 29.92 kg/m.  Physical Exam  Constitutional: Patient appears well-developed and well-nourished.Overweigth. No distress.  HENT: Head: Normocephalic and atraumatic. Ears: B TMs ok, no erythema or effusion; Nose: Nose normal. Mouth/Throat: Oropharynx is clear and moist. No oropharyngeal exudate.  Eyes: Conjunctivae and EOM are normal. Pupils are equal, round, and reactive to light. No scleral icterus.  Neck: Normal range of motion. Neck supple. No JVD present. No thyromegaly present.  Cardiovascular: Normal rate, regular rhythm and normal heart sounds.  No murmur heard. No BLE edema. Pulmonary/Chest: Effort normal and breath sounds normal. No respiratory distress. Abdominal: Soft. Bowel sounds are normal, no distension. There is no tenderness. no masses Breast: no lumps or masses, no nipple discharge or rashes FEMALE  GENITALIA:  External genitalia normal External urethra normal Pelvic not done RECTAL: not done Musculoskeletal: Normal range of motion, no joint effusions. No gross deformities Neurological: he is alert and oriented to person, place, and time. No cranial nerve deficit. She has a slow gait, occasional left foot drop , not wearing brace today  Skin: Skin is warm and dry. Acanthosis nigricans. No erythema.  Psychiatric: Patient has a normal mood and affect. behavior is normal. Judgment and thought content normal.  Recent Results (from the past 2160 hour(s))  Lipid panel     Status: Abnormal   Collection Time: 03/09/17  9:59 AM  Result Value Ref Range   Cholesterol, Total 184 100 - 199 mg/dL   Triglycerides 107 0 - 149 mg/dL   HDL 62 >39 mg/dL   VLDL Cholesterol Cal 21 5 - 40 mg/dL   LDL Calculated 101 (H) 0 - 99 mg/dL   Chol/HDL Ratio 3.0 0.0 - 4.4 ratio    Comment:                                   T. Chol/HDL Ratio                                             Men  Women                               1/2 Avg.Risk  3.4     3.3                                   Avg.Risk  5.0    4.4                                2X Avg.Risk  9.6    7.1                                3X Avg.Risk 23.4   11.0   Insulin, 2 Hour     Status: None   Collection Time: 03/09/17  9:59 AM  Result Value Ref Range   Insulin, 2 hour 6.0 0.0 - 145.4 uIU/mL  Hemoglobin A1c     Status: None   Collection Time: 03/09/17  9:59 AM  Result Value Ref Range   Hgb A1c MFr Bld 5.5 4.8 - 5.6 %    Comment:          Prediabetes: 5.7 - 6.4          Diabetes: >6.4          Glycemic control for adults with diabetes: <7.0    Est. average glucose Bld gHb Est-mCnc 111 mg/dL  Comp. Metabolic Panel (12)     Status: None   Collection Time: 03/09/17  9:59 AM  Result Value Ref Range   Glucose 99 65 - 99 mg/dL   BUN 11 6 - 24 mg/dL   Creatinine, Ser 0.98 0.57 - 1.00 mg/dL   GFR calc non Af Amer 70 >59 mL/min/1.73   GFR calc Af Amer 81 >59 mL/min/1.73  BUN/Creatinine Ratio 11 9 - 23   Sodium 139 134 - 144 mmol/L   Potassium 4.4 3.5 - 5.2 mmol/L   Chloride 100 96 - 106 mmol/L   Calcium 9.2 8.7 - 10.2 mg/dL   Total Protein 6.9 6.0 - 8.5 g/dL   Albumin 4.5 3.5 - 5.5 g/dL   Globulin, Total 2.4 1.5 - 4.5 g/dL   Albumin/Globulin Ratio 1.9 1.2 - 2.2   Bilirubin Total 0.4 0.0 - 1.2 mg/dL   Alkaline Phosphatase 78 39 - 117 IU/L   AST 17 0 - 40 IU/L  CBC With Differential     Status: None   Collection Time: 03/09/17  9:59 AM  Result Value Ref Range   WBC 7.3 3.4 - 10.8 x10E3/uL   RBC 4.08 3.77 - 5.28 x10E6/uL   Hemoglobin 12.5 11.1 - 15.9 g/dL   Hematocrit 37.9 34.0 - 46.6 %   MCV 93 79 - 97 fL   MCH 30.6 26.6 - 33.0 pg   MCHC 33.0 31.5 - 35.7 g/dL   RDW 13.8 12.3 - 15.4 %   Neutrophils 53 Not Estab. %   Lymphs 35 Not Estab. %   Monocytes 8 Not Estab. %   Eos 2 Not Estab. %   Basos 1 Not Estab. %   Neutrophils Absolute 4.0 1.4 - 7.0 x10E3/uL   Lymphocytes Absolute 2.6 0.7 - 3.1 x10E3/uL   Monocytes Absolute 0.6 0.1 - 0.9 x10E3/uL   EOS (ABSOLUTE)  0.1 0.0 - 0.4 x10E3/uL   Basophils Absolute 0.0 0.0 - 0.2 x10E3/uL   Immature Granulocytes 1 Not Estab. %   Immature Grans (Abs) 0.0 0.0 - 0.1 x10E3/uL  VITAMIN D 25 Hydroxy (Vit-D Deficiency, Fractures)     Status: None   Collection Time: 03/09/17  9:59 AM  Result Value Ref Range   Vit D, 25-Hydroxy 30.1 30.0 - 100.0 ng/mL    Comment: Vitamin D deficiency has been defined by the Institute of Medicine and an Endocrine Society practice guideline as a level of serum 25-OH vitamin D less than 20 ng/mL (1,2). The Endocrine Society went on to further define vitamin D insufficiency as a level between 21 and 29 ng/mL (2). 1. IOM (Institute of Medicine). 2010. Dietary reference    intakes for calcium and D. [place]: The    [employer]. 2. Holick MF, Binkley [place], Bischoff-Ferrari HA, et al.    Evaluation, treatment, and prevention of vitamin D    deficiency: an Endocrine Society clinical practice    guideline. JCEM. 2011 Jul; 96(7):1911-30.   Vitamin B12     Status: None   Collection Time: 03/09/17  9:59 AM  Result Value Ref Range   Vitamin B-12 297 232 - 1,245 pg/mL      PHQ2/9: Depression screen [facility] 2/9 05/04/2017 04/06/2017 01/05/2017 09/23/2016 06/29/2016  Decreased Interest 3 1 3 1 1   Down, Depressed, Hopeless 3 1 3 1  0  PHQ - 2 Score 6 2 6 2 1   Altered sleeping 1 1 2  0 -  Tired, decreased energy 3 0 3 0 -  Change in appetite 3 0 2 0 -  Feeling bad or failure about yourself  0 0 0 0 -  Trouble concentrating 3 0 3 1 -  Moving slowly or fidgety/restless 0 0 0 1 -  Suicidal thoughts 1 0 1 0 -  PHQ-9 Score 17 3 17 4  -  Difficult doing work/chores Somewhat difficult Somewhat difficult Very difficult Somewhat difficult -   She is  upset about her weight, also under stress with house remodeling.    Fall Risk: Fall Risk  05/04/2017 01/05/2017 09/23/2016 03/15/2016 12/30/2015  Falls in the past year? Yes No No Yes No  Number falls in past yr: 1 - - 1 -  Injury with Fall?  Yes - - No -  Comment Contusions on right knee  - - - -     Functional Status Survey: Is the patient deaf or have difficulty hearing?: No Does the patient have difficulty seeing, even when wearing glasses/contacts?: No Does the patient have difficulty concentrating, remembering, or making decisions?: Yes Does the patient have difficulty walking or climbing stairs?: Yes Does the patient have difficulty dressing or bathing?: No Does the patient have difficulty doing errands alone such as visiting a doctor's office or shopping?: No  Current Exercise Habits: Home exercise routine, Type of exercise: walking, Time (Minutes): 25, Frequency (Times/Week): 7, Weekly Exercise (Minutes/Week): 175, Intensity: Moderate      Assessment & Plan  1. Well woman exam  Discussed importance of 150 minutes of physical activity weekly, eat two servings of fish weekly, eat one serving of tree nuts ( cashews, pistachios, pecans, almonds.Marland Kitchen) every other day, eat 6 servings of fruit/vegetables daily and drink plenty of water and avoid sweet beverages.   2. Perennial allergic rhinitis with seasonal variation  - fluticasone (FLONASE) 50 MCG/ACT nasal spray; Place 2 sprays into both nostrils daily.  Dispense: 48 g; Refill: 1  3. Overweight (BMI 25.0-29.9)  - Amb ref to Medical Nutrition Therapy-MNT  4. Pre-diabetes  - Amb ref to Medical Nutrition Therapy-MNT  5. Colon cancer screening  - Ambulatory referral to Gastroenterology  6. Breast cancer screening  - MM DIGITAL SCREENING BILATERAL; Future

## 2017-05-04 NOTE — Patient Instructions (Signed)
Preventive Care 40-64 Years, Female Preventive care refers to lifestyle choices and visits with your health care provider that can promote health and wellness. What does preventive care include?  A yearly physical exam. This is also called an annual well check.  Dental exams once or twice a year.  Routine eye exams. Ask your health care provider how often you should have your eyes checked.  Personal lifestyle choices, including: ? Daily care of your teeth and gums. ? Regular physical activity. ? Eating a healthy diet. ? Avoiding tobacco and drug use. ? Limiting alcohol use. ? Practicing safe sex. ? Taking low-dose aspirin daily starting at age 58. ? Taking vitamin and mineral supplements as recommended by your health care provider. What happens during an annual well check? The services and screenings done by your health care provider during your annual well check will depend on your age, overall health, lifestyle risk factors, and family history of disease. Counseling Your health care provider may ask you questions about your:  Alcohol use.  Tobacco use.  Drug use.  Emotional well-being.  Home and relationship well-being.  Sexual activity.  Eating habits.  Work and work Statistician.  Method of birth control.  Menstrual cycle.  Pregnancy history.  Screening You may have the following tests or measurements:  Height, weight, and BMI.  Blood pressure.  Lipid and cholesterol levels. These may be checked every 5 years, or more frequently if you are over 81 years old.  Skin check.  Lung cancer screening. You may have this screening every year starting at age 78 if you have a 30-pack-year history of smoking and currently smoke or have quit within the past 15 years.  Fecal occult blood test (FOBT) of the stool. You may have this test every year starting at age 65.  Flexible sigmoidoscopy or colonoscopy. You may have a sigmoidoscopy every 5 years or a colonoscopy  every 10 years starting at age 30.  Hepatitis C blood test.  Hepatitis B blood test.  Sexually transmitted disease (STD) testing.  Diabetes screening. This is done by checking your blood sugar (glucose) after you have not eaten for a while (fasting). You may have this done every 1-3 years.  Mammogram. This may be done every 1-2 years. Talk to your health care provider about when you should start having regular mammograms. This may depend on whether you have a family history of breast cancer.  BRCA-related cancer screening. This may be done if you have a family history of breast, ovarian, tubal, or peritoneal cancers.  Pelvic exam and Pap test. This may be done every 3 years starting at age 80. Starting at age 36, this may be done every 5 years if you have a Pap test in combination with an HPV test.  Bone density scan. This is done to screen for osteoporosis. You may have this scan if you are at high risk for osteoporosis.  Discuss your test results, treatment options, and if necessary, the need for more tests with your health care provider. Vaccines Your health care provider may recommend certain vaccines, such as:  Influenza vaccine. This is recommended every year.  Tetanus, diphtheria, and acellular pertussis (Tdap, Td) vaccine. You may need a Td booster every 10 years.  Varicella vaccine. You may need this if you have not been vaccinated.  Zoster vaccine. You may need this after age 5.  Measles, mumps, and rubella (MMR) vaccine. You may need at least one dose of MMR if you were born in  1957 or later. You may also need a second dose.  Pneumococcal 13-valent conjugate (PCV13) vaccine. You may need this if you have certain conditions and were not previously vaccinated.  Pneumococcal polysaccharide (PPSV23) vaccine. You may need one or two doses if you smoke cigarettes or if you have certain conditions.  Meningococcal vaccine. You may need this if you have certain  conditions.  Hepatitis A vaccine. You may need this if you have certain conditions or if you travel or work in places where you may be exposed to hepatitis A.  Hepatitis B vaccine. You may need this if you have certain conditions or if you travel or work in places where you may be exposed to hepatitis B.  Haemophilus influenzae type b (Hib) vaccine. You may need this if you have certain conditions.  Talk to your health care provider about which screenings and vaccines you need and how often you need them. This information is not intended to replace advice given to you by your health care provider. Make sure you discuss any questions you have with your health care provider. Document Released: 08/08/2015 Document Revised: 03/31/2016 Document Reviewed: 05/13/2015 Elsevier Interactive Patient Education  2017 [employer].

## 2017-05-20 ENCOUNTER — Encounter: Payer: 59 | Attending: Family Medicine | Admitting: Dietician

## 2017-05-20 ENCOUNTER — Encounter: Payer: Self-pay | Admitting: Dietician

## 2017-05-20 VITALS — Ht 69.5 in | Wt 204.4 lb

## 2017-05-20 DIAGNOSIS — R7303 Prediabetes: Secondary | ICD-10-CM

## 2017-05-20 DIAGNOSIS — Z713 Dietary counseling and surveillance: Secondary | ICD-10-CM | POA: Insufficient documentation

## 2017-05-20 DIAGNOSIS — E663 Overweight: Secondary | ICD-10-CM

## 2017-05-20 NOTE — Patient Instructions (Signed)
   Record food intake using phone app, or pen-and-paper.   Aim for 400-500 calories in am/ early pm, another 400-500 in afternoon/ early evening, and again in evening/ night snack.   Make sure to include a source of protein with each meal/ lite meal/ snack. Goal for protein is at least 60grams daily.   Keep up your healthy food choices and regular exercise, great job!

## 2017-05-20 NOTE — Progress Notes (Signed)
Medical Nutrition Therapy: Visit start time: 0900  end time: 1015  Assessment:  Diagnosis: overweight, pre-diabetes Past medical history: MS, ADD, hysterectomy Psychosocial issues/ stress concerns: depression and stress due to children's issues, MS pain, weight gain Preferred learning method:  Nicki [place]  Current weight: 204.4lbs with sweater Height: 5'9.5" Medications, supplements: reconciled list in medical record  Progress and evaluation: Patient reports a history of weight fluctuation, was thin as a child and young adult, gained weight during both pregnancies but was able to lose back to 160lbs. She recently lost weight again to about 190lbs only to regain since surgery and med changes. She is dealing with daughter's emotional issues (4-5 years behind norm  In emotional development at age 45), son with autism spectrum disorder and ADHD. She deals with occasional bad pain days with her MS, but is staying busy typically and is engaging in regular physical activity. Recent labwork indicates improvement in HbA1C to 5.5%. Her diet is mostly vegetarian, with fish and occasional other poultry-based meat sources.   Physical activity: walking 15-20 minutes daily + extra 30 minutes 3 times a week  Dietary Intake:  Usual eating pattern includes 2 meals and 2 snacks per day. Dining out frequency: 1 meals per week.  Breakfast: 11am Herbalife shake-- 250kcal, 24g protein Snack: none Lunch: sometimes kind bar or almonds and fruit, smoothie with splenda Snack: none Supper: fish, salads, mostly vegetarian (husb can't eat red meat) -- veggie burgers, tofu, veggie corn dogs; lots of vegetables, brown rice-- occ white if daughter cooks; rarely pizza; fish tacos Snack: fruit I.e. tangelos, mango, strawberries, and almonds Beverages: water, diet soda cherry coke or berry, hot tea with stevia Iced green tea with stevia from [employer].   Nutrition Care Education: Topics covered: weight control, diabetes  prevention Basic nutrition: basic food groups, appropriate nutrient balance, appropriate meal and snack schedule, general nutrition guidelines    Weight control: Discussed factors affecting weight gain/loss and metabolism, including meds, stress, days of decreased activity; determined energy needs for weight loss at 1500kcal, but provided basic meal plan for 1400kcal as patient feels she is eating less than 1500kcal daily. Advised distrubiting energy intake more evenly thoroughout the day; discussed benefits of tracking intake; discussed role of exercise and stress management.  Diabetes prevention: appropriate meal and snack schedule, appropriate carb intake and balance-- importance of adequate protein intake  Nutritional Diagnosis:  [place]-3.3 Overweight/obesity As related to history of excess calories, inactivity, stress, medications.  As evidenced by BMI BMI 29.5, patient report.  Intervention: Instruction as noted above.   Set goals with direction from patient.   She declines scheduling follow-up at this time but will schedule later if needed.    Will plan to check on patient's progress in 4-6 weeks by email.  Education Materials given:  . Food lists/ Planning A Balanced Meal . Goals/ instructions   Learner/ who was taught:  . Patient   Level of understanding: Marland Kitchen Verbalizes/ demonstrates competency  Demonstrated degree of understanding via:   Teach back Learning barriers: . None  Willingness to learn/ readiness for change: . Eager, change in progress  Monitoring and Evaluation:  Dietary intake, exercise, BG control, and body weight      follow up: prn

## 2017-05-25 ENCOUNTER — Encounter: Payer: 59 | Admitting: Obstetrics and Gynecology

## 2017-05-28 ENCOUNTER — Other Ambulatory Visit: Payer: Self-pay | Admitting: Family Medicine

## 2017-05-28 DIAGNOSIS — F330 Major depressive disorder, recurrent, mild: Secondary | ICD-10-CM

## 2017-06-27 ENCOUNTER — Encounter: Payer: Self-pay | Admitting: Dietician

## 2017-06-27 NOTE — Progress Notes (Signed)
Sent email message to patient to check on her progress since 05/20/17 appointment.

## 2017-06-29 ENCOUNTER — Encounter: Payer: 59 | Admitting: Obstetrics and Gynecology

## 2017-06-29 ENCOUNTER — Encounter: Payer: Self-pay | Admitting: Obstetrics and Gynecology

## 2017-06-29 ENCOUNTER — Ambulatory Visit: Payer: 59 | Admitting: Obstetrics and Gynecology

## 2017-06-29 VITALS — Ht 69.5 in

## 2017-06-29 DIAGNOSIS — N809 Endometriosis, unspecified: Secondary | ICD-10-CM

## 2017-06-29 DIAGNOSIS — Z90710 Acquired absence of both cervix and uterus: Secondary | ICD-10-CM | POA: Diagnosis not present

## 2017-06-29 DIAGNOSIS — R102 Pelvic and perineal pain: Secondary | ICD-10-CM

## 2017-06-29 NOTE — Patient Instructions (Signed)
1.  Remain off of norethindrone acetate at this time 2.  Recommend occasional use of naproxen or ibuprofen for pelvic pain 3.  Return in 6 months for follow-up on endometriosis 4.  Continue with annual physicals with Dr. Ancil Boozer

## 2017-06-29 NOTE — Progress Notes (Signed)
Chief complaint: 1.  History of endometriosis 2.  History of pelvic pain, left lower quadrant 3.  History of chronic constipation and bowel dysfunction associated with pain  Patient presents for 42-month follow-up on chronic pelvic pain likely due to endometriosis with associated bowel dysfunction/chronic constipation.  She has done well since discontinuing the norethindrone acetate.  She has had only one flare of left lower quadrant discomfort.  She has taken naproxen with some relief of this discomfort.  Overall, Robin Chan is satisfied with current status of pelvic pain and bowel function.  She is willing to remain off of norethindrone acetate at this time and will try to manage her pain with associated nonsteroidal medication.  Past medical history, past surgical history, problem list, medications, and allergies are reviewed  OBJECTIVE: Ht 5' 9.5" (1.765 m)   LMP 05/19/2015   BMI 29.75 kg/m  Pleasant well-appearing female in no acute distress.  Alert and oriented. Abdomen: Soft, nontender without organomegaly Back: No palpable flank tenderness Pelvic: Deferred  ASSESSMENT: 1.  History of endometriosis, status post LAVH bilateral salpingectomy and left oophorectomy 2.  History of left lower quadrant pain and bowel dysfunction, clinically stable at this time off of norethindrone acetate 3.  Recent weight loss  PLAN: 1.  Remain off norethindrone acetate at this time 2.  Continue to monitor symptoms of bowel dysfunction, chronic constipation, and left lower quadrant pain 3.  Consider using naproxen or ibuprofen for symptomatic pain relief 4.  Return in 6 months for follow-up on endometriosis and pelvic pain 5.  Continue with annual exams with Dr. Ancil Boozer  A total of 15 minutes were spent face-to-face with the patient during this encounter and over half of that time dealt with counseling and coordination of care.  Brayton Mars, MD  Note: This dictation was prepared with Dragon  dictation along with smaller phrase technology. Any transcriptional errors that result from this process are unintentional.

## 2017-07-06 ENCOUNTER — Ambulatory Visit: Payer: 59 | Admitting: Family Medicine

## 2017-07-06 ENCOUNTER — Encounter: Payer: Self-pay | Admitting: Family Medicine

## 2017-07-06 VITALS — BP 126/68 | HR 91 | Temp 98.1°F | Resp 18 | Ht 70.0 in | Wt 201.4 lb

## 2017-07-06 DIAGNOSIS — G35 Multiple sclerosis: Secondary | ICD-10-CM | POA: Diagnosis not present

## 2017-07-06 DIAGNOSIS — R7303 Prediabetes: Secondary | ICD-10-CM | POA: Diagnosis not present

## 2017-07-06 DIAGNOSIS — F902 Attention-deficit hyperactivity disorder, combined type: Secondary | ICD-10-CM

## 2017-07-06 DIAGNOSIS — F330 Major depressive disorder, recurrent, mild: Secondary | ICD-10-CM

## 2017-07-06 DIAGNOSIS — J4520 Mild intermittent asthma, uncomplicated: Secondary | ICD-10-CM

## 2017-07-06 DIAGNOSIS — J302 Other seasonal allergic rhinitis: Secondary | ICD-10-CM | POA: Diagnosis not present

## 2017-07-06 DIAGNOSIS — E663 Overweight: Secondary | ICD-10-CM

## 2017-07-06 DIAGNOSIS — G4709 Other insomnia: Secondary | ICD-10-CM

## 2017-07-06 DIAGNOSIS — J3089 Other allergic rhinitis: Secondary | ICD-10-CM | POA: Diagnosis not present

## 2017-07-06 MED ORDER — AMPHETAMINE-DEXTROAMPHET ER 20 MG PO CP24: 20.0000 mg | ORAL_CAPSULE | ORAL | 0 refills | Status: DC

## 2017-07-06 NOTE — Progress Notes (Signed)
Name: Robin Chan   MRN: [id number]    DOB: 1970/08/21   Date:07/06/2017       Progress Note  Subjective  Chief Complaint  Chief Complaint  Patient presents with  . Medication Refill  . ADD  . Multiple Sclerosis  . Insomnia    When Seroquel works she will sleep well, when it works will sleep 6-8 hours nightly.   . Depression  . Asthma    Well controlled has not had to use her inhaler in a few weeks  . Allergic Rhinitis     Dull headache for a few weeks    HPI   ADD: taking medication daily as prescribed, on Adderal XR 20 mg now and she states she has been feeling well with medication, no side effects. She states she feels very sluggish and no energy or ability to focus without medication   MS: diagnosed in 2000, but first symptoms in 1991 - she felt numb on the right side and weakness. Currently off medication because of multiple side effects. Has intermittent weakness and has used a cane periodically, for left foot drop - worse when she is tired and when it rains. Adderall helps with energy level. She still has left leg numbness intermittent - can last from hours to days. She is on long term disability.   Insomnia: she has been off Ambien , we stopped Spring 2018.  She is taking Seroquel low dose qhs and helps her fall asleep ( takes about 10-20 minutes ) and it helps her stay asleep. She is back in a routine now, she is in bed between midnight and 1 am, and gets up around 8. She drives her daughter to [employer], but currently her daughter is on school break and she is going to bed late again  Major Depression: she was taking Lexapro for many years and depression was not controlled, we switched to Cymbalta in June 2017 , she is feeling better since last visit, but not in remission, discussed changing medication but she wants to hold off for now. She needs to see a therapist, and she will try to do that prior to changing medications  Asthma: she is taking singulair prn, she has  dry coughing spells. She states usually triggered by getting hot. Resolves once she cools off. She denies wheezing or SOB. She has not used rescue inhaler lately  AR: she has been taking medication and denies rhinorrhea or nasal congestion at this time. She has noticed maxillary sinus pressure lately also. Advised to try nasal steroid and saline spray   Patient Active Problem List   Diagnosis Date Noted  . Pelvic pain 04/07/2017  . Medication management 04/07/2017  . Rectocele 07/08/2015  . Status post laparoscopic assisted vaginal hysterectomy (LAVH) 07/08/2015  . History of hysterectomy for benign disease 05/26/2015  . Left lumbar radiculitis 03/28/2015  . Breast cancer screening 02/05/2015  . Abnormal CAT scan 12/31/2014  . ADD (attention deficit disorder) 12/31/2014  . Allergic to bees 12/31/2014  . Back muscle spasm 12/31/2014  . Insomnia, persistent 12/31/2014  . Constipation 12/31/2014  . Depression, major, recurrent, mild ([place]) 12/31/2014  . Personal history of traumatic fracture 12/31/2014  . Asthma, mild intermittent 12/31/2014  . Migraine with aura and without status migrainosus, not intractable 12/31/2014  . Multiple sclerosis, relapsing-remitting ([place]) 12/31/2014  . Endometriosis determined by laparoscopy 12/31/2014  . Perennial allergic rhinitis 12/31/2014  . Pituitary microadenoma ([place]) 12/31/2014  . Vitamin D deficiency 12/31/2014  . Acquired trigger  finger 12/31/2014    Past Surgical History:  Procedure Laterality Date  . ABDOMINAL HYSTERECTOMY  05/26/2015  . BREAST BIOPSY Right 2005   benign  . LAPAROSCOPIC ASSISTED VAGINAL HYSTERECTOMY N/A 05/26/2015   Procedure: LAPAROSCOPIC ASSISTED VAGINAL HYSTERECTOMY, with right salpingectomy;  Surgeon: Brayton Mars, MD;  Location: [facility] ORS;  Service: Gynecology;  Laterality: N/A;  . OVARY SURGERY Left 09/30/2014  . RECTOCELE REPAIR N/A 05/26/2015   Procedure: POSTERIOR REPAIR (RECTOCELE);  Surgeon: Brayton Mars, MD;  Location: [facility] ORS;  Service: Gynecology;  Laterality: N/A;    Family History  Problem Relation Age of Onset  . Anemia Mother   . Osteoporosis Mother   . Mental illness Father        Bipolar  . Arthritis Brother   . Breast cancer Maternal Aunt     Social History   Socioeconomic History  . Marital status: Married    Spouse name: Not on file  . Number of children: Not on file  . Years of education: Not on file  . Highest education level: Not on file  Social Needs  . Financial resource strain: Not on file  . Food insecurity - worry: Not on file  . Food insecurity - inability: Not on file  . Transportation needs - medical: Not on file  . Transportation needs - non-medical: Not on file  Occupational History  . Not on file  Tobacco Use  . Smoking status: Never Smoker  . Smokeless tobacco: Never Used  Substance and Sexual Activity  . Alcohol use: Yes    Alcohol/week: 0.0 oz    Comment: occasionally  . Drug use: No  . Sexual activity: Yes    Partners: Male    Birth control/protection: None    Comment: husband has ED, Hysterectomy  Other Topics Concern  . Not on file  Social History Narrative   She is married, both of them born and raised in [place].    Moved to [place] in 1998 because of husband's job transfer, that is when she stopped working -pending her green-card.   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    She had symptoms of MS in 2000 , daughter was 5 year old and she never went back to work     Current Outpatient Medications:  .  albuterol (PROVENTIL HFA;VENTOLIN HFA) 108 (90 BASE) MCG/ACT inhaler, Inhale 1 puff into the lungs every 6 (six) hours as needed for wheezing or shortness of breath. Reported on 09/26/2015, Disp: , Rfl:  .  amphetamine-dextroamphetamine (ADDERALL XR) 20 MG 24 hr capsule, Take 1 capsule (20 mg total) by mouth every morning., Disp: 90 capsule, Rfl: 0 .  Cetirizine HCl 10 MG CAPS, Take by mouth., Disp: , Rfl:  .  DULoxetine  (CYMBALTA) 60 MG capsule, TAKE 1 CAPSULE BY MOUTH  DAILY, Disp: 90 capsule, Rfl: 1 .  EPINEPHrine 0.3 mg/0.3 mL IJ SOAJ injection, Inject 0.3 mg into the muscle once. Reported on 09/26/2015, Disp: , Rfl:  .  fluticasone (FLONASE) 50 MCG/ACT nasal spray, Place 2 sprays into both nostrils daily., Disp: 48 g, Rfl: 1 .  levocetirizine (XYZAL) 5 MG tablet, Take 1 tablet (5 mg total) by mouth every evening., Disp: 90 tablet, Rfl: 4 .  montelukast (SINGULAIR) 10 MG tablet, Take 1 tablet (10 mg total) by mouth at bedtime., Disp: 90 tablet, Rfl: 1 .  Omega-3 Fatty Acids (FISH OIL) 1000 MG CAPS, Take by mouth., Disp: , Rfl:  .  polyethylene glycol  powder (GLYCOLAX/MIRALAX) powder, Take 17 g by mouth daily., Disp: 3350 g, Rfl: 0 .  QUEtiapine (SEROQUEL) 25 MG tablet, Take 1 tablet (25 mg total) by mouth at bedtime., Disp: 90 tablet, Rfl: 1 .  vitamin B-12 (CYANOCOBALAMIN) 1000 MCG tablet, Take 1,000 mcg by mouth daily., Disp: , Rfl:   Allergies  Allergen Reactions  . Penicillins Anaphylaxis  . Bee Venom     Bee     ROS  Constitutional: Negative for fever or weight change.  Respiratory: Negative for cough and shortness of breath.   Cardiovascular: Negative for chest pain or palpitations.  Gastrointestinal: Negative for abdominal pain, no bowel changes.  Musculoskeletal: Negative for gait problem or joint swelling.  Skin: Negative for rash.  Neurological: Negative for dizziness or headache.  No other specific complaints in a complete review of systems (except as listed in HPI above).  Objective  Vitals:   07/06/17 1438  BP: 126/68  Pulse: 91  Resp: 18  Temp: 98.1 F (36.7 C)  TempSrc: Oral  SpO2: 98%  Weight: 201 lb 6.4 oz (91.4 kg)  Height: 5\' 10"  (1.778 m)    Body mass index is 28.9 kg/m.  Physical Exam  Constitutional: Patient appears well-developed and well-nourished. Obese No distress.  HEENT: head atraumatic, normocephalic, pupils equal and reactive to light,  neck supple,  throat within normal limits Cardiovascular: Normal rate, regular rhythm and normal heart sounds.  No murmur heard. No BLE edema. Pulmonary/Chest: Effort normal and breath sounds normal. No respiratory distress. Abdominal: Soft.  There is no tenderness. Psychiatric: Patient has a normal mood and affect. behavior is normal. Judgment and thought content normal. Neurological: some dysarthria, no focal findings.    PHQ2/9: Depression screen [facility] 2/9 07/06/2017 05/20/2017 05/04/2017 04/06/2017 01/05/2017  Decreased Interest 3 3 3 1 3   Down, Depressed, Hopeless 2 3 3 1 3   PHQ - 2 Score 5 6 6 2 6   Altered sleeping 0 1 1 1 2   Tired, decreased energy 1 3 3  0 3  Change in appetite 1 3 3  0 2  Feeling bad or failure about yourself  0 0 0 0 0  Trouble concentrating 2 3 3  0 3  Moving slowly or fidgety/restless 2 0 0 0 0  Suicidal thoughts 0 0 1 0 1  PHQ-9 Score 11 16 17 3 17   Difficult doing work/chores Somewhat difficult Somewhat difficult Somewhat difficult Somewhat difficult Very difficult  Some recent data might be hidden    Fall Risk: Fall Risk  07/06/2017 05/20/2017 05/04/2017 01/05/2017 09/23/2016  Falls in the past year? Yes Yes Yes No No  Number falls in past yr: 1 1 1  - -  Injury with Fall? Yes Yes Yes - -  Comment Contusion on left knee - Contusions on right knee  - -  Risk Factor Category  - High Fall Risk - - -  Comment - "drop-foot" from [place] - - -  Risk for fall due to : - Impaired balance/gait - - -  Follow up - Education provided - - -     Functional Status Survey: Is the patient deaf or have difficulty hearing?: No Does the patient have difficulty seeing, even when wearing glasses/contacts?: No Does the patient have difficulty concentrating, remembering, or making decisions?: Yes(Brain Fog and trouble concentrating) Does the patient have difficulty walking or climbing stairs?: No Does the patient have difficulty dressing or bathing?: No Does the patient have difficulty doing  errands alone such as visiting a doctor's office  or shopping?: No   Assessment & Plan  1. Multiple sclerosis, relapsing-remitting (HCC)  Stable, she is disabled   2. Depression, major, recurrent, mild ([place])  Waiting until new insurance to see therapist  3. Overweight (BMI 25.0-29.9)   4. Perennial allergic rhinitis with seasonal variation   5. Pre-diabetes  On life style modification  6. Attention deficit hyperactivity disorder (ADHD), combined type  - amphetamine-dextroamphetamine (ADDERALL XR) 20 MG 24 hr capsule; Take 1 capsule (20 mg total) by mouth every morning.  Dispense: 90 capsule; Refill: 0  7. Other insomnia  Needs to resume sleep hygiene   8. Mild intermittent asthma without complication  Doing well at this time

## 2017-08-19 ENCOUNTER — Encounter: Payer: Self-pay | Admitting: *Deleted

## 2017-08-30 DIAGNOSIS — F902 Attention-deficit hyperactivity disorder, combined type: Secondary | ICD-10-CM | POA: Diagnosis not present

## 2017-08-30 DIAGNOSIS — F5105 Insomnia due to other mental disorder: Secondary | ICD-10-CM | POA: Diagnosis not present

## 2017-08-30 DIAGNOSIS — F332 Major depressive disorder, recurrent severe without psychotic features: Secondary | ICD-10-CM | POA: Diagnosis not present

## 2017-08-30 DIAGNOSIS — Z79899 Other long term (current) drug therapy: Secondary | ICD-10-CM | POA: Diagnosis not present

## 2017-09-11 ENCOUNTER — Other Ambulatory Visit: Payer: Self-pay | Admitting: Family Medicine

## 2017-09-11 DIAGNOSIS — J4520 Mild intermittent asthma, uncomplicated: Secondary | ICD-10-CM

## 2017-09-16 ENCOUNTER — Encounter: Payer: Self-pay | Admitting: Family Medicine

## 2017-09-27 ENCOUNTER — Encounter: Payer: 59 | Admitting: Obstetrics and Gynecology

## 2017-09-30 DIAGNOSIS — F902 Attention-deficit hyperactivity disorder, combined type: Secondary | ICD-10-CM | POA: Diagnosis not present

## 2017-09-30 DIAGNOSIS — F332 Major depressive disorder, recurrent severe without psychotic features: Secondary | ICD-10-CM | POA: Diagnosis not present

## 2017-09-30 DIAGNOSIS — F5105 Insomnia due to other mental disorder: Secondary | ICD-10-CM | POA: Diagnosis not present

## 2017-10-04 ENCOUNTER — Ambulatory Visit: Payer: 59 | Admitting: Family Medicine

## 2017-10-04 ENCOUNTER — Encounter: Payer: Self-pay | Admitting: Family Medicine

## 2017-10-04 DIAGNOSIS — J302 Other seasonal allergic rhinitis: Secondary | ICD-10-CM

## 2017-10-04 DIAGNOSIS — J3089 Other allergic rhinitis: Secondary | ICD-10-CM

## 2017-10-04 DIAGNOSIS — F902 Attention-deficit hyperactivity disorder, combined type: Secondary | ICD-10-CM | POA: Diagnosis not present

## 2017-10-04 DIAGNOSIS — F330 Major depressive disorder, recurrent, mild: Secondary | ICD-10-CM

## 2017-10-04 DIAGNOSIS — G35 Multiple sclerosis: Secondary | ICD-10-CM

## 2017-10-04 DIAGNOSIS — G4709 Other insomnia: Secondary | ICD-10-CM

## 2017-10-04 MED ORDER — DULOXETINE HCL 30 MG PO CPEP: 90.0000 mg | ORAL_CAPSULE | Freq: Every day | ORAL | 0 refills | Status: DC

## 2017-10-04 MED ORDER — AMPHETAMINE-DEXTROAMPHET ER 20 MG PO CP24: 20.0000 mg | ORAL_CAPSULE | ORAL | 0 refills | Status: DC

## 2017-10-04 MED ORDER — QUETIAPINE FUMARATE 25 MG PO TABS: 25.0000 mg | ORAL_TABLET | Freq: Every day | ORAL | 1 refills | Status: DC

## 2017-10-04 MED ORDER — FLUTICASONE PROPIONATE 50 MCG/ACT NA SUSP: 2.0000 | Freq: Every day | NASAL | 1 refills | Status: DC

## 2017-10-04 NOTE — Progress Notes (Signed)
Name: Robin Chan   MRN: [id number]    DOB: 01/22/1971   Date:10/04/2017       Progress Note  Subjective  Chief Complaint  Chief Complaint  Patient presents with  . ADD  . Hypertension  . Asthma  . Depression    HPI  ADD: taking medication daily as prescribed, on Adderal XR 20 mg now and she states she has been feeling well with medication, no side effects.She states she feels very sluggish and no energy or ability to focus without medication. She is working now, since March 2019 helps with her focus  MS: diagnosed in 2000, but first symptoms in 1991 - she felt numb on the right side and weakness. Currently off medication because of multiple side effects. Has intermittent weakness and has used a cane periodically, for left foot drop - worse when she is tired and when it rains. Adderall helps with energy level. She still has left leg numbness intermittent - can last from hours to days. She is on long term disability, however just recently went to Vocational rehab and found a job a [employer], states the first week she has super tired and having leg pain and she had to get her brace out. Adjusting to the change now.   Insomnia: she has been off Ambien, we stopped it on the Spring 2018. She is taking Seroquel low dose qhs and helps her fall asleep ( takes about 10-20 minutes ) and it helps her stay asleep. Sheis back in a routine now, she is in bed between midnight and 1 am, and gets up around 8.   Major Depression: she was taking Lexapro for many years and depression was not controlled, we switched to Cymbalta in June 2017 ,. She has been under more stress since last visit. She is going to see a Chief Executive Officer. She is on the process of separation from her husband. However happy that she got a job and also found her half brother that lives in [place]. Seeing a psychiatrist at [facility] who recently increased dose of Cymbalta from 60 mg to 90 mg. Denies side effects on her higher dose.   Asthma:  she is taking singulairprn,she has dry coughing spells. She states usually triggered by getting hot. Resolves once she cools off. She denies wheezing or SOB. She has not used rescue inhaler lately  AR: she has been taking medication and denies rhinorrhea or nasal congestion at this time.  Advised to try nasal steroid and saline spray    Patient Active Problem List   Diagnosis Date Noted  . Pelvic pain 04/07/2017  . Medication management 04/07/2017  . Rectocele 07/08/2015  . Status post laparoscopic assisted vaginal hysterectomy (LAVH) 07/08/2015  . History of hysterectomy for benign disease 05/26/2015  . Left lumbar radiculitis 03/28/2015  . Breast cancer screening 02/05/2015  . Abnormal CAT scan 12/31/2014  . ADD (attention deficit disorder) 12/31/2014  . Allergic to bees 12/31/2014  . Back muscle spasm 12/31/2014  . Insomnia, persistent 12/31/2014  . Constipation 12/31/2014  . Depression, major, recurrent, mild ([place]) 12/31/2014  . Personal history of traumatic fracture 12/31/2014  . Asthma, mild intermittent 12/31/2014  . Migraine with aura and without status migrainosus, not intractable 12/31/2014  . Multiple sclerosis, relapsing-remitting ([place]) 12/31/2014  . Endometriosis determined by laparoscopy 12/31/2014  . Perennial allergic rhinitis 12/31/2014  . Pituitary microadenoma ([place]) 12/31/2014  . Vitamin D deficiency 12/31/2014  . Acquired trigger finger 12/31/2014    Past Surgical History:  Procedure  Laterality Date  . ABDOMINAL HYSTERECTOMY  05/26/2015  . BREAST BIOPSY Right 2005   benign  . LAPAROSCOPIC ASSISTED VAGINAL HYSTERECTOMY N/A 05/26/2015   Procedure: LAPAROSCOPIC ASSISTED VAGINAL HYSTERECTOMY, with right salpingectomy;  Surgeon: Brayton Mars, MD;  Location: [facility] ORS;  Service: Gynecology;  Laterality: N/A;  . OVARY SURGERY Left 09/30/2014  . RECTOCELE REPAIR N/A 05/26/2015   Procedure: POSTERIOR REPAIR (RECTOCELE);  Surgeon: Brayton Mars, MD;   Location: [facility] ORS;  Service: Gynecology;  Laterality: N/A;    Family History  Problem Relation Age of Onset  . Anemia Mother   . Osteoporosis Mother   . Mental illness Father        Bipolar  . Arthritis Brother   . Breast cancer Maternal Aunt     Social History   Socioeconomic History  . Marital status: Married    Spouse name: Not on file  . Number of children: Not on file  . Years of education: Not on file  . Highest education level: Not on file  Social Needs  . Financial resource strain: Not on file  . Food insecurity - worry: Not on file  . Food insecurity - inability: Not on file  . Transportation needs - medical: Not on file  . Transportation needs - non-medical: Not on file  Occupational History  . Not on file  Tobacco Use  . Smoking status: Never Smoker  . Smokeless tobacco: Never Used  Substance and Sexual Activity  . Alcohol use: Yes    Alcohol/week: 0.0 oz    Comment: occasionally  . Drug use: No  . Sexual activity: Yes    Partners: Male    Birth control/protection: None    Comment: husband has ED, Hysterectomy  Other Topics Concern  . Not on file  Social History Narrative   She is married, both of them born and raised in [place].    Moved to [place] in 1998 because of husband's job transfer, that is when she stopped working -pending her green-card.   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    She had symptoms of MS in 2000 , daughter was 10 year old and she never went back to work     Current Outpatient Medications:  .  albuterol (PROVENTIL HFA;VENTOLIN HFA) 108 (90 BASE) MCG/ACT inhaler, Inhale 1 puff into the lungs every 6 (six) hours as needed for wheezing or shortness of breath. Reported on 09/26/2015, Disp: , Rfl:  .  amphetamine-dextroamphetamine (ADDERALL XR) 20 MG 24 hr capsule, Take 1 capsule (20 mg total) by mouth every morning., Disp: 90 capsule, Rfl: 0 .  Cetirizine HCl 10 MG CAPS, Take by mouth., Disp: , Rfl:  .  DULoxetine (CYMBALTA) 30 MG  capsule, Take 3 capsules (90 mg total) by mouth daily. [facility] psychiatrist, Disp: 270 capsule, Rfl: 0 .  EPINEPHrine 0.3 mg/0.3 mL IJ SOAJ injection, Inject 0.3 mg into the muscle once. Reported on 09/26/2015, Disp: , Rfl:  .  fluticasone (FLONASE) 50 MCG/ACT nasal spray, Place 2 sprays into both nostrils daily., Disp: 48 g, Rfl: 1 .  levocetirizine (XYZAL) 5 MG tablet, Take 1 tablet (5 mg total) by mouth every evening., Disp: 90 tablet, Rfl: 4 .  montelukast (SINGULAIR) 10 MG tablet, TAKE 1 TABLET BY MOUTH AT  BEDTIME, Disp: 90 tablet, Rfl: 1 .  Omega-3 Fatty Acids (FISH OIL) 1000 MG CAPS, Take by mouth., Disp: , Rfl:  .  polyethylene glycol powder (GLYCOLAX/MIRALAX) powder, Take 17 g by mouth  daily., Disp: 3350 g, Rfl: 0 .  QUEtiapine (SEROQUEL) 25 MG tablet, Take 1 tablet (25 mg total) by mouth at bedtime., Disp: 90 tablet, Rfl: 1 .  vitamin B-12 (CYANOCOBALAMIN) 1000 MCG tablet, Take 1,000 mcg by mouth daily., Disp: , Rfl:   Allergies  Allergen Reactions  . Penicillins Anaphylaxis  . Bee Venom     Bee     ROS  Constitutional: Negative for fever, positive for weight change.  Respiratory: Negative for cough and shortness of breath.   Cardiovascular: Negative for chest pain or palpitations.  Gastrointestinal: Negative for abdominal pain, no bowel changes.  Musculoskeletal: Negative for gait problem or joint swelling.  Skin: Negative for rash.  Neurological: Negative for dizziness or headache.  No other specific complaints in a complete review of systems (except as listed in HPI above).  Objective  Vitals:   10/04/17 1030  BP: 114/68  Pulse: 98  Temp: 98.3 F (36.8 C)  TempSrc: Oral  SpO2: 97%  Weight: 208 lb 1.6 oz (94.4 kg)  Height: 5\' 10"  (1.778 m)    Body mass index is 29.86 kg/m.  Physical Exam  Constitutional: Patient appears well-developed and well-nourished. Overweight.  No distress.  HEENT: head atraumatic, normocephalic, pupils equal and reactive to light, neck  supple, throat within normal limits Cardiovascular: Normal rate, regular rhythm and normal heart sounds.  No murmur heard. No BLE edema. Pulmonary/Chest: Effort normal and breath sounds normal. No respiratory distress. Abdominal: Soft.  There is no tenderness. Psychiatric: Patient has a normal mood and affect. behavior is normal. Judgment and thought content normal. Neurological: normal grip, mild dysarthria ( stable) , normal gait today   PHQ2/9: Depression screen [facility] 2/9 10/04/2017 07/06/2017 05/20/2017 05/04/2017 04/06/2017  Decreased Interest 1 3 3 3 1   Down, Depressed, Hopeless 1 2 3 3 1   PHQ - 2 Score 2 5 6 6 2   Altered sleeping 0 0 1 1 1   Tired, decreased energy 1 1 3 3  0  Change in appetite 2 1 3 3  0  Feeling bad or failure about yourself  0 0 0 0 0  Trouble concentrating 0 2 3 3  0  Moving slowly or fidgety/restless 0 2 0 0 0  Suicidal thoughts 0 0 0 1 0  PHQ-9 Score 5 11 16 17 3   Difficult doing work/chores Not difficult at all Somewhat difficult Somewhat difficult Somewhat difficult Somewhat difficult  Some recent data might be hidden     Fall Risk: Fall Risk  10/04/2017 07/06/2017 05/20/2017 05/04/2017 01/05/2017  Falls in the past year? No Yes Yes Yes No  Number falls in past yr: - 1 1 1  -  Injury with Fall? - Yes Yes Yes -  Comment - Contusion on left knee - Contusions on right knee  -  Risk Factor Category  - - High Fall Risk - -  Comment - - "drop-foot" from [place] - -  Risk for fall due to : - - Impaired balance/gait - -  Follow up - - Education provided - -     Functional Status Survey: Is the patient deaf or have difficulty hearing?: No Does the patient have difficulty seeing, even when wearing glasses/contacts?: No Does the patient have difficulty concentrating, remembering, or making decisions?: No Does the patient have difficulty walking or climbing stairs?: No Does the patient have difficulty dressing or bathing?: No Does the patient have difficulty doing  errands alone such as visiting a doctor's office or shopping?: No    Assessment &  Plan  1. Multiple sclerosis, relapsing-remitting (HCC)  - amphetamine-dextroamphetamine (ADDERALL XR) 20 MG 24 hr capsule; Take 1 capsule (20 mg total) by mouth every morning.  Dispense: 90 capsule; Refill: 0  2. Depression, major, recurrent, mild (HCC)  - amphetamine-dextroamphetamine (ADDERALL XR) 20 MG 24 hr capsule; Take 1 capsule (20 mg total) by mouth every morning.  Dispense: 90 capsule; Refill: 0 - DULoxetine (CYMBALTA) 30 MG capsule; Take 3 capsules (90 mg total) by mouth daily. [facility] psychiatrist  Dispense: 270 capsule; Refill: 0  3. Attention deficit hyperactivity disorder (ADHD), combined type  - amphetamine-dextroamphetamine (ADDERALL XR) 20 MG 24 hr capsule; Take 1 capsule (20 mg total) by mouth every morning.  Dispense: 90 capsule; Refill: 0  4. Perennial allergic rhinitis with seasonal variation  - fluticasone (FLONASE) 50 MCG/ACT nasal spray; Place 2 sprays into both nostrils daily.  Dispense: 48 g; Refill: 1  5. Other insomnia  - QUEtiapine (SEROQUEL) 25 MG tablet; Take 1 tablet (25 mg total) by mouth at bedtime.  Dispense: 90 tablet; Refill: 1

## 2018-01-04 ENCOUNTER — Ambulatory Visit: Payer: BLUE CROSS/BLUE SHIELD | Admitting: Family Medicine

## 2018-01-04 ENCOUNTER — Encounter: Payer: Self-pay | Admitting: Family Medicine

## 2018-01-04 VITALS — BP 100/70 | HR 87 | Resp 16 | Ht 70.0 in | Wt 197.7 lb

## 2018-01-04 DIAGNOSIS — D352 Benign neoplasm of pituitary gland: Secondary | ICD-10-CM

## 2018-01-04 DIAGNOSIS — G35 Multiple sclerosis: Secondary | ICD-10-CM | POA: Diagnosis not present

## 2018-01-04 DIAGNOSIS — J3089 Other allergic rhinitis: Secondary | ICD-10-CM | POA: Diagnosis not present

## 2018-01-04 DIAGNOSIS — J302 Other seasonal allergic rhinitis: Secondary | ICD-10-CM

## 2018-01-04 DIAGNOSIS — F902 Attention-deficit hyperactivity disorder, combined type: Secondary | ICD-10-CM

## 2018-01-04 DIAGNOSIS — R7303 Prediabetes: Secondary | ICD-10-CM

## 2018-01-04 DIAGNOSIS — E663 Overweight: Secondary | ICD-10-CM | POA: Diagnosis not present

## 2018-01-04 DIAGNOSIS — F330 Major depressive disorder, recurrent, mild: Secondary | ICD-10-CM

## 2018-01-04 DIAGNOSIS — J4520 Mild intermittent asthma, uncomplicated: Secondary | ICD-10-CM

## 2018-01-04 DIAGNOSIS — G4700 Insomnia, unspecified: Secondary | ICD-10-CM | POA: Diagnosis not present

## 2018-01-04 MED ORDER — MONTELUKAST SODIUM 10 MG PO TABS: 10.0000 mg | ORAL_TABLET | Freq: Every day | ORAL | 1 refills | Status: DC

## 2018-01-04 MED ORDER — AMPHETAMINE-DEXTROAMPHET ER 20 MG PO CP24: 20.0000 mg | ORAL_CAPSULE | ORAL | 0 refills | Status: DC

## 2018-01-04 MED ORDER — LEVOCETIRIZINE DIHYDROCHLORIDE 5 MG PO TABS: 5.0000 mg | ORAL_TABLET | Freq: Every evening | ORAL | 4 refills | Status: DC

## 2018-01-04 NOTE — Progress Notes (Signed)
Name: Robin Chan   MRN: [id number]    DOB: 1971/04/07   Date:01/04/2018       Progress Note  Subjective  Chief Complaint  Chief Complaint  Patient presents with  . Migraine  . Asthma  . ADD  . Depression    HPI  ADD: taking medication daily as prescribed, on Adderal XR 20 mg now and she states she has been feeling well with medication, no side effects.She states she feels very sluggishand no energy or ability to focus without medication. She is working now, since March 2019 helps with her focus, unchanged and needs refills today   MS: diagnosed in 2000, but first symptoms in 1991 - she felt numb on the right side and weakness. Currently off medication because of multiple side effects. Has intermittent weakness and has used a cane periodically, for left foot drop - worse when she is tired and when it rains. Adderall helps with energy level. She still has left leg numbness intermittent - can last from hours to days. She started part time work at [employer] 03/19 and similar to volunteer work she did in the past.   Insomnia: she has been off Ambien, we stopped it on the Spring 2018. She is taking Seroquel low dose qhs and helps her fall asleep ( takes about 10-20 minutes ) and it helps her stay asleep. Sheis back in a routine now, she is in bed between midnight and 1 am, and gets up around 8. She has noticed worsening of difficulty sleeping because of stress.   Major Depression: she was taking Lexapro for many years and depression was not controlled, we switched to Cymbalta in June 2017 ,. She has been under more stress since last visit. She is going to see a Chief Executive Officer. She is on the process of separation from her husband, papers to be signed tomorrow.. However happy that she got a job and also found her half brother that lives in [place]. Seeing a psychiatrist at [facility] who recently increased dose of Cymbalta from 60 mg to 90 mg. Denies side effects on her higher dose. PhQ 9 is higher  today but will continue follow up with psychiatrist and therapist   Asthma: she is taking singulairprn,she has dry coughing spells. She states usually triggered by getting hot. Resolves once she cools off. She denies wheezing or SOB. She has not used rescue inhaler lately. Unchanged.   AR:  rhinorrhea or nasal congestion controlled as long  as she takes Xyzal, Zyrtec and flonase daily.   Pituitary microadenoma: no significant change since 2011 -last one was 2019, diagnosed a mass possible Rathke's cleft cyst   Patient Active Problem List   Diagnosis Date Noted  . Pelvic pain 04/07/2017  . Medication management 04/07/2017  . Rectocele 07/08/2015  . Status post laparoscopic assisted vaginal hysterectomy (LAVH) 07/08/2015  . History of hysterectomy for benign disease 05/26/2015  . Left lumbar radiculitis 03/28/2015  . Breast cancer screening 02/05/2015  . Abnormal CAT scan 12/31/2014  . ADD (attention deficit disorder) 12/31/2014  . Allergic to bees 12/31/2014  . Back muscle spasm 12/31/2014  . Insomnia, persistent 12/31/2014  . Constipation 12/31/2014  . Depression, major, recurrent, mild ([place]) 12/31/2014  . Personal history of traumatic fracture 12/31/2014  . Asthma, mild intermittent 12/31/2014  . Migraine with aura and without status migrainosus, not intractable 12/31/2014  . Multiple sclerosis, relapsing-remitting ([place]) 12/31/2014  . Endometriosis determined by laparoscopy 12/31/2014  . Perennial allergic rhinitis 12/31/2014  . Pituitary  microadenoma ([place]) 12/31/2014  . Vitamin D deficiency 12/31/2014  . Acquired trigger finger 12/31/2014    Past Surgical History:  Procedure Laterality Date  . ABDOMINAL HYSTERECTOMY  05/26/2015  . BREAST BIOPSY Right 2005   benign  . LAPAROSCOPIC ASSISTED VAGINAL HYSTERECTOMY N/A 05/26/2015   Procedure: LAPAROSCOPIC ASSISTED VAGINAL HYSTERECTOMY, with right salpingectomy;  Surgeon: Brayton Mars, MD;  Location: [facility] ORS;   Service: Gynecology;  Laterality: N/A;  . OVARY SURGERY Left 09/30/2014  . RECTOCELE REPAIR N/A 05/26/2015   Procedure: POSTERIOR REPAIR (RECTOCELE);  Surgeon: Brayton Mars, MD;  Location: [facility] ORS;  Service: Gynecology;  Laterality: N/A;    Family History  Problem Relation Age of Onset  . Anemia Mother   . Osteoporosis Mother   . Mental illness Father        Bipolar  . Arthritis Brother   . Breast cancer Maternal Aunt     Social History   Socioeconomic History  . Marital status: Married    Spouse name: Not on file  . Number of children: 2  . Years of education: Not on file  . Highest education level: Associate degree: academic program  Occupational History  . Occupation: Proofreader     Comment: [employer]   Social Needs  . Financial resource strain: Not hard at all  . Food insecurity:    Worry: Never true    Inability: Never true  . Transportation needs:    Medical: No    Non-medical: No  Tobacco Use  . Smoking status: Never Smoker  . Smokeless tobacco: Never Used  Substance and Sexual Activity  . Alcohol use: Yes    Alcohol/week: 0.0 oz    Comment: occasionally  . Drug use: No  . Sexual activity: Not Currently    Partners: Male    Comment: separated   Lifestyle  . Physical activity:    Days per week: 0 days    Minutes per session: 0 min  . Stress: Rather much  Relationships  . Social connections:    Talks on phone: More than three times a week    Gets together: More than three times a week    Attends religious service: Never    Active member of club or organization: No    Attends meetings of clubs or organizations: Never    Relationship status: Married  . Intimate partner violence:    Fear of current or ex partner: No    Emotionally abused: No    Physically abused: No    Forced sexual activity: No  Other Topics Concern  . Not on file  Social History Narrative   She is married, both of them born and raised in [place].    Moved to [place] in  1998 because of husband's job transfer, that is when she stopped working -pending her green-card.   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    She had symptoms of MS in 2000 , daughter was 62 year old and just now found a job at [employer] March 2019   Marriage is not going well since 07/2017 they are about to get separated      Current Outpatient Medications:  .  albuterol (PROVENTIL HFA;VENTOLIN HFA) 108 (90 BASE) MCG/ACT inhaler, Inhale 1 puff into the lungs every 6 (six) hours as needed for wheezing or shortness of breath. Reported on 09/26/2015, Disp: , Rfl:  .  amphetamine-dextroamphetamine (ADDERALL XR) 20 MG 24 hr capsule, Take 1 capsule (20 mg total)  by mouth every morning., Disp: 90 capsule, Rfl: 0 .  Cetirizine HCl 10 MG CAPS, Take by mouth., Disp: , Rfl:  .  DULoxetine (CYMBALTA) 30 MG capsule, Take 3 capsules (90 mg total) by mouth daily. [facility] psychiatrist, Disp: 270 capsule, Rfl: 0 .  EPINEPHrine 0.3 mg/0.3 mL IJ SOAJ injection, Inject 0.3 mg into the muscle once. Reported on 09/26/2015, Disp: , Rfl:  .  fluticasone (FLONASE) 50 MCG/ACT nasal spray, Place 2 sprays into both nostrils daily., Disp: 48 g, Rfl: 1 .  levocetirizine (XYZAL) 5 MG tablet, Take 1 tablet (5 mg total) by mouth every evening., Disp: 90 tablet, Rfl: 4 .  montelukast (SINGULAIR) 10 MG tablet, TAKE 1 TABLET BY MOUTH AT  BEDTIME, Disp: 90 tablet, Rfl: 1 .  polyethylene glycol powder (GLYCOLAX/MIRALAX) powder, Take 17 g by mouth daily., Disp: 3350 g, Rfl: 0 .  QUEtiapine (SEROQUEL) 25 MG tablet, Take 1 tablet (25 mg total) by mouth at bedtime., Disp: 90 tablet, Rfl: 1 .  vitamin B-12 (CYANOCOBALAMIN) 1000 MCG tablet, Take 1,000 mcg by mouth daily., Disp: , Rfl:   Allergies  Allergen Reactions  . Penicillins Anaphylaxis  . Bee Venom     Bee     ROS  Constitutional: Negative for fever or weight change.  Respiratory: Negative for cough and shortness of breath.   Cardiovascular: Negative for chest pain or  palpitations.  Gastrointestinal: Negative for abdominal pain, no bowel changes.  Musculoskeletal: Negative for gait problem or joint swelling.  Skin: Negative for rash.  Neurological: Negative for dizziness, positive for intermittent  headache.  No other specific complaints in a complete review of systems (except as listed in HPI above).  Objective  Vitals:   01/04/18 1416  BP: 100/70  Pulse: 87  Resp: 16  SpO2: 97%  Weight: 197 lb 11.2 oz (89.7 kg)  Height: 5\' 10"  (1.778 m)    Body mass index is 28.37 kg/m.  Physical Exam  Constitutional: Patient appears well-developed and well-nourished. Overweight.  No distress.  HEENT: head atraumatic, normocephalic, pupils equal and reactive to light,neck supple, throat within normal limits Cardiovascular: Normal rate, regular rhythm and normal heart sounds.  No murmur heard. No BLE edema. Pulmonary/Chest: Effort normal and breath sounds normal. No respiratory distress. Abdominal: Soft.  There is no tenderness. Psychiatric: Patient has a normal mood and affect. behavior is normal. Judgment and thought content normal. Neurological: left foot drop still present, cranial nerve intact, rombeg  negative  PHQ2/9: Depression screen [facility] 2/9 01/04/2018 10/04/2017 07/06/2017 05/20/2017 05/04/2017  Decreased Interest 2 1 3 3 3   Down, Depressed, Hopeless 3 1 2 3 3   PHQ - 2 Score 5 2 5 6 6   Altered sleeping 2 0 0 1 1  Tired, decreased energy 1 1 1 3 3   Change in appetite 2 2 1 3 3   Feeling bad or failure about yourself  0 0 0 0 0  Trouble concentrating 0 0 2 3 3   Moving slowly or fidgety/restless 1 0 2 0 0  Suicidal thoughts 1 0 0 0 1  PHQ-9 Score 12 5 11 16 17   Difficult doing work/chores Somewhat difficult Not difficult at all Somewhat difficult Somewhat difficult Somewhat difficult  Some recent data might be hidden     Fall Risk: Fall Risk  01/04/2018 10/04/2017 07/06/2017 05/20/2017 05/04/2017  Falls in the past year? No No Yes Yes Yes   Number falls in past yr: - - 1 1 1   Injury with Fall? - -  Yes Yes Yes  Comment - - Contusion on left knee - Contusions on right knee   Risk Factor Category  - - - High Fall Risk -  Comment - - - "drop-foot" from [place] -  Risk for fall due to : - - - Impaired balance/gait -  Follow up - - - Education provided -     Functional Status Survey: Is the patient deaf or have difficulty hearing?: No Does the patient have difficulty seeing, even when wearing glasses/contacts?: No Does the patient have difficulty concentrating, remembering, or making decisions?: No Does the patient have difficulty walking or climbing stairs?: No Does the patient have difficulty dressing or bathing?: No Does the patient have difficulty doing errands alone such as visiting a doctor's office or shopping?: No    Assessment & Plan  1. Multiple sclerosis, relapsing-remitting (HCC)  - amphetamine-dextroamphetamine (ADDERALL XR) 20 MG 24 hr capsule; Take 1 capsule (20 mg total) by mouth every morning.  Dispense: 90 capsule; Refill: 0  2. Pituitary microadenoma (HCC)  Stable, no symptoms   3. Depression, major, recurrent, mild (HCC)  - amphetamine-dextroamphetamine (ADDERALL XR) 20 MG 24 hr capsule; Take 1 capsule (20 mg total) by mouth every morning.  Dispense: 90 capsule; Refill: 0  4. Attention deficit hyperactivity disorder (ADHD), combined type  - amphetamine-dextroamphetamine (ADDERALL XR) 20 MG 24 hr capsule; Take 1 capsule (20 mg total) by mouth every morning.  Dispense: 90 capsule; Refill: 0  5. Mild intermittent asthma without complication  - montelukast (SINGULAIR) 10 MG tablet; Take 1 tablet (10 mg total) by mouth at bedtime.  Dispense: 90 tablet; Refill: 1  6. Pre-diabetes  Discussed again life style modification   7. Overweight (BMI 25.0-29.9)  Discussed with the patient the risk posed by an increased BMI. Discussed importance of portion control, calorie counting and at least 150 minutes of  physical activity weekly. Avoid sweet beverages and drink more water. Eat at least 6 servings of fruit and vegetables daily   8. Insomnia, persistent  Taking seroquel   9. Perennial allergic rhinitis with seasonal variation  - levocetirizine (XYZAL) 5 MG tablet; Take 1 tablet (5 mg total) by mouth every evening.  Dispense: 90 tablet; Refill: 4

## 2018-02-02 ENCOUNTER — Ambulatory Visit: Payer: BLUE CROSS/BLUE SHIELD | Admitting: Nurse Practitioner

## 2018-02-02 ENCOUNTER — Encounter: Payer: Self-pay | Admitting: Nurse Practitioner

## 2018-02-02 VITALS — BP 110/70 | HR 84 | Temp 97.6°F | Resp 16 | Ht 70.0 in | Wt 200.0 lb

## 2018-02-02 DIAGNOSIS — M5441 Lumbago with sciatica, right side: Secondary | ICD-10-CM

## 2018-02-02 DIAGNOSIS — M5416 Radiculopathy, lumbar region: Secondary | ICD-10-CM | POA: Diagnosis not present

## 2018-02-02 MED ORDER — PREDNISONE 10 MG (48) PO TBPK: ORAL_TABLET | ORAL | 0 refills | Status: DC

## 2018-02-02 NOTE — Patient Instructions (Signed)
- Please take steroid taper as directed - Do not take NSAIDs like naproxen when on this medications - Take tylenol for additional pain relief instead.   Lumbosacral Radiculopathy Lumbosacral radiculopathy is a condition that involves the spinal nerves and nerve roots in the low back and bottom of the spine. The condition develops when these nerves and nerve roots move out of place or become inflamed and cause symptoms. What are the causes? This condition may be caused by:  Pressure from a disk that bulges out of place (herniated disk). A disk is a plate of cartilage that separates bones in the spine.  Disk degeneration.  A narrowing of the bones of the lower back (spinal stenosis).  A tumor.  An infection.  An injury that places sudden pressure on the disks that cushion the bones of your lower spine.  What increases the risk? This condition is more likely to develop in:  Males aged 30-50 years.  Females aged 38-60 years.  People who lift improperly.  People who are overweight or live a sedentary lifestyle.  People who smoke.  People who perform repetitive activities that strain the spine.  What are the signs or symptoms? Symptoms of this condition include:  Pain that goes down from the back into the legs (sciatica). This is the most common symptom. The pain may be worse with sitting, coughing, or sneezing.  Pain and numbness in the arms and legs.  Muscle weakness.  Tingling.  Loss of bladder control or bowel control.  How is this diagnosed? This condition is diagnosed with a physical exam and medical history. If the pain is lasting, you may have tests, such as:  MRI scan.  X-ray.  CT scan.  Myelogram.  Nerve conduction study.  How is this treated? This condition is often treated with:  Hot packs and ice applied to affected areas.  Stretches to improve flexibility.  Exercises to strengthen back muscles.  Physical therapy.  Pain medicine.  A  steroid injection in the spine.  In some cases, no treatment is needed. If the condition is long-lasting (chronic), or if symptoms are severe, treatment may involve surgery or lifestyle changes, such as following a weight loss plan. Follow these instructions at home: Medicines  Take medicines only as directed by your health care provider.  Do not drive or operate heavy machinery while taking pain medicine. Injury care  Apply a heat pack to the injured area as directed by your health care provider.  Apply ice to the affected area: ? Put ice in a plastic bag. ? Place a towel between your skin and the bag. ? Leave the ice on for 20-30 minutes, every 2 hours while you are awake or as needed. Or, leave the ice on for as long as directed by your health care provider. Other Instructions  If you were shown how to do any exercises or stretches, do them as directed by your health care provider.  If your health care provider prescribed a diet or exercise program, follow it as directed.  Keep all follow-up visits as directed by your health care provider. This is important. Contact a health care provider if:  Your pain does not improve over time even when taking pain medicines. Get help right away if:  Your develop severe pain.  Your pain suddenly gets worse.  You develop increasing weakness in your legs.  You lose the ability to control your bladder or bowel.  You have difficulty walking or balancing.  You have  a fever. This information is not intended to replace advice given to you by your health care provider. Make sure you discuss any questions you have with your health care provider. Document Released: 07/12/2005 Document Revised: 12/18/2015 Document Reviewed: 07/08/2014 Elsevier Interactive Patient Education  [employer].

## 2018-02-02 NOTE — Progress Notes (Addendum)
Name: Robin Chan   MRN: [id number]    DOB: February 15, 1971   Date:02/02/2018       Progress Note  Subjective  Chief Complaint  Chief Complaint  Patient presents with  . Muscle Pain    She complains of having a MS and Arthritis flare x 1 day. She said she went to get out of bed yesterday morning and fell to the floor.    HPI  Patient states this feels like the very first MS flare she had and lier her arthritis flare- feels like fist sized knot in left lower back- consistent with your arthritis pain. Patient also c/o numbing, burning pain on left left- left mid thigh down to toes. Also having right knee pain- described as dull ache pain.  Patient had seen neurologist in the past but was unable to tolerate the side effects of medications.  Patient states last flare thought it was MS but said it was arthritis was more then 10 years ago.  Denies bowel/bladder incontinence. Patient has taken naproxen that calmed her leg down enough to fall asleep.   Patient Active Problem List   Diagnosis Date Noted  . Pelvic pain 04/07/2017  . Medication management 04/07/2017  . Rectocele 07/08/2015  . Status post laparoscopic assisted vaginal hysterectomy (LAVH) 07/08/2015  . History of hysterectomy for benign disease 05/26/2015  . Left lumbar radiculitis 03/28/2015  . Breast cancer screening 02/05/2015  . Abnormal CAT scan 12/31/2014  . ADD (attention deficit disorder) 12/31/2014  . Allergic to bees 12/31/2014  . Back muscle spasm 12/31/2014  . Insomnia, persistent 12/31/2014  . Constipation 12/31/2014  . Depression, major, recurrent, mild ([place]) 12/31/2014  . Personal history of traumatic fracture 12/31/2014  . Asthma, mild intermittent 12/31/2014  . Migraine with aura and without status migrainosus, not intractable 12/31/2014  . Multiple sclerosis, relapsing-remitting ([place]) 12/31/2014  . Endometriosis determined by laparoscopy 12/31/2014  . Perennial allergic rhinitis 12/31/2014  . Pituitary  microadenoma ([place]) 12/31/2014  . Vitamin D deficiency 12/31/2014  . Acquired trigger finger 12/31/2014    Past Medical History:  Diagnosis Date  . ADHD (attention deficit hyperactivity disorder)   . Allergy   . Anxiety   . Chronic pelvic pain in female   . Complex ovarian cyst   . Depression   . Dyspareunia   . Endometriosis   . Fibroid   . GERD (gastroesophageal reflux disease)   . Heavy period   . MS (multiple sclerosis) ([place])   . Painful menstruation   . Rectocele     Past Surgical History:  Procedure Laterality Date  . ABDOMINAL HYSTERECTOMY  05/26/2015  . BREAST BIOPSY Right 2005   benign  . LAPAROSCOPIC ASSISTED VAGINAL HYSTERECTOMY N/A 05/26/2015   Procedure: LAPAROSCOPIC ASSISTED VAGINAL HYSTERECTOMY, with right salpingectomy;  Surgeon: Brayton Mars, MD;  Location: [facility] ORS;  Service: Gynecology;  Laterality: N/A;  . OVARY SURGERY Left 09/30/2014  . RECTOCELE REPAIR N/A 05/26/2015   Procedure: POSTERIOR REPAIR (RECTOCELE);  Surgeon: Brayton Mars, MD;  Location: [facility] ORS;  Service: Gynecology;  Laterality: N/A;    Social History   Tobacco Use  . Smoking status: Never Smoker  . Smokeless tobacco: Never Used  Substance Use Topics  . Alcohol use: Yes    Alcohol/week: 0.0 oz    Comment: occasionally     Current Outpatient Medications:  .  albuterol (PROVENTIL HFA;VENTOLIN HFA) 108 (90 BASE) MCG/ACT inhaler, Inhale 1 puff into the lungs every 6 (six) hours as needed for wheezing  or shortness of breath. Reported on 09/26/2015, Disp: , Rfl:  .  amphetamine-dextroamphetamine (ADDERALL XR) 20 MG 24 hr capsule, Take 1 capsule (20 mg total) by mouth every morning., Disp: 90 capsule, Rfl: 0 .  Cetirizine HCl 10 MG CAPS, Take by mouth., Disp: , Rfl:  .  DULoxetine (CYMBALTA) 30 MG capsule, Take 3 capsules (90 mg total) by mouth daily. [facility] psychiatrist, Disp: 270 capsule, Rfl: 0 .  EPINEPHrine 0.3 mg/0.3 mL IJ SOAJ injection, Inject 0.3 mg into the muscle  once. Reported on 09/26/2015, Disp: , Rfl:  .  fluticasone (FLONASE) 50 MCG/ACT nasal spray, Place 2 sprays into both nostrils daily., Disp: 48 g, Rfl: 1 .  levocetirizine (XYZAL) 5 MG tablet, Take 1 tablet (5 mg total) by mouth every evening., Disp: 90 tablet, Rfl: 4 .  montelukast (SINGULAIR) 10 MG tablet, Take 1 tablet (10 mg total) by mouth at bedtime., Disp: 90 tablet, Rfl: 1 .  polyethylene glycol powder (GLYCOLAX/MIRALAX) powder, Take 17 g by mouth daily., Disp: 3350 g, Rfl: 0 .  QUEtiapine (SEROQUEL) 25 MG tablet, Take 1 tablet (25 mg total) by mouth at bedtime., Disp: 90 tablet, Rfl: 1 .  vitamin B-12 (CYANOCOBALAMIN) 1000 MCG tablet, Take 1,000 mcg by mouth daily., Disp: , Rfl:   Allergies  Allergen Reactions  . Penicillins Anaphylaxis  . Bee Venom     Bee    ROS  Constitutional: Negative for fever or weight change.  Respiratory: Negative for cough and shortness of breath.   Cardiovascular: Negative for chest pain or palpitations.  Gastrointestinal: Negative for abdominal pain, no bowel changes.  Musculoskeletal: Negative for gait problem or joint swelling.  Skin: Negative for rash.  Neurological: Negative for dizziness or headache.  No other specific complaints in a complete review of systems (except as listed in HPI above).  Objective  Vitals:   02/02/18 0846  BP: 110/70  Pulse: 84  Resp: 16  Temp: 97.6 F (36.4 C)  TempSrc: Oral  SpO2: 97%  Weight: 200 lb (90.7 kg)  Height: 5\' 10"  (1.778 m)     Body mass index is 28.7 kg/m.  Nursing Note and Vital Signs reviewed.  Physical Exam   Constitutional: Patient appears well-developed and well-nourished. Appears uncomfortable sitting in wheelchair with left leg straightened  Cardiovascular: Normal rate, regular rhythm, S1/S2 present. Pulses intact Pulmonary/Chest: Effort normal and breath sounds clear. No respiratory distress or retractions. Abdominal: Soft and non-tender, bowel sounds present  MSK: able to  stand and walk but pain with lower back shooting down left leg, sciatic area tender to palpation, no obvious deformities, bruising or redness noted.    No results found for this or any previous visit (from the past 72 hour(s)).  Assessment & Plan  1. Acute back pain with sciatica, right - predniSONE (STERAPRED UNI-PAK 48 TAB) 10 MG (48) TBPK tablet; Take as directed  Dispense: 48 tablet; Refill: 0  2. Left lumbar radiculitis - stop nsaids; take acetaminophen while on taper  - predniSONE (STERAPRED UNI-PAK 48 TAB) 10 MG (48) TBPK tablet; Take as directed  Dispense: 48 tablet; Refill: 0    -Red flags and when to present for emergency care or RTC including fever >101.78F, chest pain, shortness of breath, new/worsening/un-resolving symptoms, bowel/bladder incontience reviewed with patient at time of visit. Follow up and care instructions discussed and provided in AVS.    I have reviewed this encounter including the documentation in this note and/or discussed this patient with the provider, Suezanne Cheshire DNP  AGNP-C. I am certifying that I agree with the content of this note as supervising physician. Steele Sizer, MD [facility] 02/02/2018, 1:45 PM

## 2018-02-09 DIAGNOSIS — F5105 Insomnia due to other mental disorder: Secondary | ICD-10-CM | POA: Diagnosis not present

## 2018-02-09 DIAGNOSIS — F332 Major depressive disorder, recurrent severe without psychotic features: Secondary | ICD-10-CM | POA: Diagnosis not present

## 2018-02-09 DIAGNOSIS — F902 Attention-deficit hyperactivity disorder, combined type: Secondary | ICD-10-CM | POA: Diagnosis not present

## 2018-04-07 ENCOUNTER — Ambulatory Visit: Payer: BLUE CROSS/BLUE SHIELD | Admitting: Family Medicine

## 2018-04-07 ENCOUNTER — Encounter: Payer: Self-pay | Admitting: Family Medicine

## 2018-04-07 VITALS — BP 114/62 | HR 97 | Temp 98.1°F | Resp 16 | Ht 70.0 in | Wt 195.5 lb

## 2018-04-07 DIAGNOSIS — Z23 Encounter for immunization: Secondary | ICD-10-CM | POA: Diagnosis not present

## 2018-04-07 DIAGNOSIS — D352 Benign neoplasm of pituitary gland: Secondary | ICD-10-CM | POA: Diagnosis not present

## 2018-04-07 DIAGNOSIS — F330 Major depressive disorder, recurrent, mild: Secondary | ICD-10-CM

## 2018-04-07 DIAGNOSIS — M5442 Lumbago with sciatica, left side: Secondary | ICD-10-CM

## 2018-04-07 DIAGNOSIS — Z79899 Other long term (current) drug therapy: Secondary | ICD-10-CM

## 2018-04-07 DIAGNOSIS — Z13220 Encounter for screening for lipoid disorders: Secondary | ICD-10-CM

## 2018-04-07 DIAGNOSIS — J302 Other seasonal allergic rhinitis: Secondary | ICD-10-CM

## 2018-04-07 DIAGNOSIS — G4700 Insomnia, unspecified: Secondary | ICD-10-CM

## 2018-04-07 DIAGNOSIS — R7303 Prediabetes: Secondary | ICD-10-CM

## 2018-04-07 DIAGNOSIS — G35 Multiple sclerosis: Secondary | ICD-10-CM | POA: Diagnosis not present

## 2018-04-07 DIAGNOSIS — J3089 Other allergic rhinitis: Secondary | ICD-10-CM

## 2018-04-07 DIAGNOSIS — J4520 Mild intermittent asthma, uncomplicated: Secondary | ICD-10-CM

## 2018-04-07 DIAGNOSIS — E538 Deficiency of other specified B group vitamins: Secondary | ICD-10-CM

## 2018-04-07 DIAGNOSIS — G8929 Other chronic pain: Secondary | ICD-10-CM

## 2018-04-07 DIAGNOSIS — G4709 Other insomnia: Secondary | ICD-10-CM

## 2018-04-07 DIAGNOSIS — E559 Vitamin D deficiency, unspecified: Secondary | ICD-10-CM

## 2018-04-07 DIAGNOSIS — F902 Attention-deficit hyperactivity disorder, combined type: Secondary | ICD-10-CM

## 2018-04-07 MED ORDER — AMPHETAMINE-DEXTROAMPHETAMINE 10 MG PO TABS: 10.0000 mg | ORAL_TABLET | Freq: Every day | ORAL | 0 refills | Status: DC | PRN

## 2018-04-07 MED ORDER — QUETIAPINE FUMARATE 25 MG PO TABS: 25.0000 mg | ORAL_TABLET | Freq: Every day | ORAL | 1 refills | Status: DC

## 2018-04-07 MED ORDER — AMPHETAMINE-DEXTROAMPHET ER 20 MG PO CP24: 20.0000 mg | ORAL_CAPSULE | ORAL | 0 refills | Status: DC

## 2018-04-07 MED ORDER — FLUTICASONE PROPIONATE 50 MCG/ACT NA SUSP: 2.0000 | Freq: Every day | NASAL | 1 refills | Status: DC

## 2018-04-07 NOTE — Progress Notes (Signed)
Name: Robin Chan   MRN: [id number]    DOB: 10/22/1970   Date:04/07/2018       Progress Note  Subjective  Chief Complaint  Chief Complaint  Patient presents with  . Medication Refill  . Multiple Sclerosis  . Asthma  . ADD  . Depression  . Allergic Rhinitis     HPI  ADD: taking medication daily as prescribed, on Adderal XR 20 mg now and she states she has been feeling well with medication, no side effects.She states she feels very sluggishand no energy or ability to focus without medication. She is working now, since March 2019 helps with her focus, she lives alone now, sometimes wakes up very late on weekends and would like to try a immediate release so she can socialize even if she cannot take XR formulation those days.   Low back pain: she was seen here with left radiculitis on 07/11 took a round of prednisone taper and symptoms improved a little, but still having daily low back pain , no longer has radiculitis. She would like see ortho   MS: diagnosed in 2000, but first symptoms in 1991 - she felt numb on the right side and weakness. Currently off medication because of multiple side effects. Has intermittent weakness and has used a cane periodically, for left foot drop - worse when she is tired and when it rains. Adderall helps with energy level. She still has left leg numbness intermittent - can last from hours to days. She started part time work at [employer] 03/19. She states it is hot at work and does not think she can work full time at this time  Insomnia: she has been off Ambien, we stoppedit on theSpring 2018. She is taking Seroquel low dose qhs and helps her fall asleep ( takes about 10-20 minutes ) , she states she has been sleeping better since living alone. Occasionally wakes up because of her cat  Major Depression: she was taking Lexapro for many years and depression was not controlled, we switched to Cymbalta in June 2017 ,. She has been under more stress since  last visit. She is going to see a Chief Executive Officer. She is on the process of separation from her husband, papers to be signed tomorrow.. However happy that she got a job and also found her half brother that lives in [place]. Seeing a psychiatrist at [facility] who recently increased dose of Cymbalta from 60 mg to 90 mg. Denies side effects on her higher dose.PhQ 9 has improved since last visit, she states she is doing much better since separated  Feeling happy   Asthma: she is taking singulairprn,she has dry coughing spells. She states usually triggered by getting hot. Resolves once she cools off. She denies wheezing or SOB. She has not had a flare in a long time.   AR:  rhinorrhea or nasal congestion controlled as long  as she takes Xyzal, Zyrtec and flonase daily. Unchanged.   Pituitary microadenoma: no significant change since 2011 -last one was 2019, diagnosed a mass possible Rathke's cleft cyst, recheck prolactin    Patient Active Problem List   Diagnosis Date Noted  . Pelvic pain 04/07/2017  . Medication management 04/07/2017  . Rectocele 07/08/2015  . Status post laparoscopic assisted vaginal hysterectomy (LAVH) 07/08/2015  . History of hysterectomy for benign disease 05/26/2015  . Left lumbar radiculitis 03/28/2015  . Breast cancer screening 02/05/2015  . Abnormal CAT scan 12/31/2014  . ADD (attention deficit disorder) 12/31/2014  .  Allergic to bees 12/31/2014  . Back muscle spasm 12/31/2014  . Insomnia, persistent 12/31/2014  . Constipation 12/31/2014  . Depression, major, recurrent, mild ([place]) 12/31/2014  . Personal history of traumatic fracture 12/31/2014  . Asthma, mild intermittent 12/31/2014  . Migraine with aura and without status migrainosus, not intractable 12/31/2014  . Multiple sclerosis, relapsing-remitting ([place]) 12/31/2014  . Endometriosis determined by laparoscopy 12/31/2014  . Perennial allergic rhinitis 12/31/2014  . Pituitary microadenoma ([place]) 12/31/2014  . Vitamin D  deficiency 12/31/2014  . Acquired trigger finger 12/31/2014    Past Surgical History:  Procedure Laterality Date  . ABDOMINAL HYSTERECTOMY  05/26/2015  . BREAST BIOPSY Right 2005   benign  . LAPAROSCOPIC ASSISTED VAGINAL HYSTERECTOMY N/A 05/26/2015   Procedure: LAPAROSCOPIC ASSISTED VAGINAL HYSTERECTOMY, with right salpingectomy;  Surgeon: Brayton Mars, MD;  Location: [facility] ORS;  Service: Gynecology;  Laterality: N/A;  . OVARY SURGERY Left 09/30/2014  . RECTOCELE REPAIR N/A 05/26/2015   Procedure: POSTERIOR REPAIR (RECTOCELE);  Surgeon: Brayton Mars, MD;  Location: [facility] ORS;  Service: Gynecology;  Laterality: N/A;    Family History  Problem Relation Age of Onset  . Anemia Mother   . Osteoporosis Mother   . Mental illness Father        Bipolar  . Arthritis Brother   . Breast cancer Maternal Aunt     Social History   Socioeconomic History  . Marital status: Married    Spouse name: Not on file  . Number of children: 2  . Years of education: Not on file  . Highest education level: Associate degree: academic program  Occupational History  . Occupation: Proofreader     Comment: [employer]   Social Needs  . Financial resource strain: Not hard at all  . Food insecurity:    Worry: Never true    Inability: Never true  . Transportation needs:    Medical: No    Non-medical: No  Tobacco Use  . Smoking status: Never Smoker  . Smokeless tobacco: Never Used  Substance and Sexual Activity  . Alcohol use: Yes    Alcohol/week: 0.0 standard drinks    Comment: occasionally  . Drug use: No  . Sexual activity: Not Currently    Partners: Male    Comment: separated   Lifestyle  . Physical activity:    Days per week: 0 days    Minutes per session: 0 min  . Stress: Rather much  Relationships  . Social connections:    Talks on phone: More than three times a week    Gets together: More than three times a week    Attends religious service: Never    Active member  of club or organization: No    Attends meetings of clubs or organizations: Never    Relationship status: Married  . Intimate partner violence:    Fear of current or ex partner: No    Emotionally abused: No    Physically abused: No    Forced sexual activity: No  Other Topics Concern  . Not on file  Social History Narrative   She is from [place].    Moved to [place] in 1998 because of husband's job transfer, that is when she stopped working -pending her green-card.   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    She had symptoms of MS in 2000 , daughter was 32 year old and just now found a job at [employer] March 2019   Separated since July 2019  Current Outpatient Medications:  .  albuterol (PROVENTIL HFA;VENTOLIN HFA) 108 (90 BASE) MCG/ACT inhaler, Inhale 1 puff into the lungs every 6 (six) hours as needed for wheezing or shortness of breath. Reported on 09/26/2015, Disp: , Rfl:  .  amphetamine-dextroamphetamine (ADDERALL XR) 20 MG 24 hr capsule, Take 1 capsule (20 mg total) by mouth every morning., Disp: 90 capsule, Rfl: 0 .  DULoxetine (CYMBALTA) 30 MG capsule, Take 3 capsules (90 mg total) by mouth daily. [facility] psychiatrist, Disp: 270 capsule, Rfl: 0 .  EPINEPHrine 0.3 mg/0.3 mL IJ SOAJ injection, Inject 0.3 mg into the muscle once. Reported on 09/26/2015, Disp: , Rfl:  .  fluticasone (FLONASE) 50 MCG/ACT nasal spray, Place 2 sprays into both nostrils daily., Disp: 48 g, Rfl: 1 .  levocetirizine (XYZAL) 5 MG tablet, Take 1 tablet (5 mg total) by mouth every evening., Disp: 90 tablet, Rfl: 4 .  montelukast (SINGULAIR) 10 MG tablet, Take 1 tablet (10 mg total) by mouth at bedtime., Disp: 90 tablet, Rfl: 1 .  polyethylene glycol powder (GLYCOLAX/MIRALAX) powder, Take 17 g by mouth daily., Disp: 3350 g, Rfl: 0 .  QUEtiapine (SEROQUEL) 25 MG tablet, Take 1 tablet (25 mg total) by mouth at bedtime., Disp: 90 tablet, Rfl: 1 .  vitamin B-12 (CYANOCOBALAMIN) 1000 MCG tablet, Take 1,000 mcg by mouth  daily., Disp: , Rfl:  .  amphetamine-dextroamphetamine (ADDERALL) 10 MG tablet, Take 1 tablet (10 mg total) by mouth daily as needed. On weekends when you sleep in, Disp: 30 tablet, Rfl: 0  Allergies  Allergen Reactions  . Penicillins Anaphylaxis  . Bee Venom     Bee    I personally reviewed active problem list, medication list, allergies, family history, social history with the patient/caregiver today.   ROS  Constitutional: Negative for fever, positive for mild  weight change.  Respiratory: Negative for cough and shortness of breath.   Cardiovascular: Negative for chest pain or palpitations.  Gastrointestinal: Negative for abdominal pain, no bowel changes.  Musculoskeletal: Positive  for intermittent gait problem but no  joint swelling.  Skin: Negative for rash.  Neurological: Negative for dizziness or headache.  No other specific complaints in a complete review of systems (except as listed in HPI above).  Objective  Vitals:   04/07/18 1051  BP: 114/62  Pulse: 97  Resp: 16  Temp: 98.1 F (36.7 C)  TempSrc: Oral  SpO2: 98%  Weight: 195 lb 8 oz (88.7 kg)  Height: 5\' 10"  (1.778 m)    Body mass index is 28.05 kg/m.  Physical Exam  Constitutional: Patient appears well-developed and well-nourished. Obese No distress.  HEENT: head atraumatic, normocephalic, pupils equal and reactive to light, , neck supple, throat within normal limits Cardiovascular: Normal rate, regular rhythm and normal heart sounds.  No murmur heard. No BLE edema. Pulmonary/Chest: Effort normal and breath sounds normal. No respiratory distress. Abdominal: Soft.  There is no tenderness. Muscular skeletal: pain during palpation of left lower back, positive  straight leg raise  Psychiatric: Patient has a normal mood and affect. behavior is normal. Judgment and thought content normal.  PHQ2/9: Depression screen [facility] 2/9 04/07/2018 01/04/2018 10/04/2017 07/06/2017 05/20/2017  Decreased Interest 0 2 1 3 3    Down, Depressed, Hopeless 0 3 1 2 3   PHQ - 2 Score 0 5 2 5 6   Altered sleeping 1 2 0 0 1  Tired, decreased energy 0 1 1 1 3   Change in appetite 1 2 2 1 3   Feeling bad or  failure about yourself  0 0 0 0 0  Trouble concentrating 0 0 0 2 3  Moving slowly or fidgety/restless 0 1 0 2 0  Suicidal thoughts 0 1 0 0 0  PHQ-9 Score 2 12 5 11 16   Difficult doing work/chores Not difficult at all Somewhat difficult Not difficult at all Somewhat difficult Somewhat difficult  Some recent data might be hidden     Fall Risk: Fall Risk  04/07/2018 02/02/2018 01/04/2018 10/04/2017 07/06/2017  Falls in the past year? No Yes No No Yes  Number falls in past yr: - 1 - - 1  Injury with Fall? - No - - Yes  Comment - - - - Contusion on left knee  Risk Factor Category  - - - - -  Comment - - - - -  Risk for fall due to : - - - - -  Follow up - - - - -     Functional Status Survey: Is the patient deaf or have difficulty hearing?: No Does the patient have difficulty seeing, even when wearing glasses/contacts?: Yes Does the patient have difficulty concentrating, remembering, or making decisions?: No Does the patient have difficulty walking or climbing stairs?: No Does the patient have difficulty dressing or bathing?: No Does the patient have difficulty doing errands alone such as visiting a doctor's office or shopping?: No    Assessment & Plan  1. Chronic left-sided low back pain with left-sided sciatica  - Ambulatory referral to Orthopedic Surgery  2. Need for immunization against influenza  - Flu Vaccine QUAD 6+ mos PF IM (Fluarix Quad PF)  3. Multiple sclerosis, relapsing-remitting (HCC)  - amphetamine-dextroamphetamine (ADDERALL XR) 20 MG 24 hr capsule; Take 1 capsule (20 mg total) by mouth every morning.  Dispense: 90 capsule; Refill: 0   3. Multiple sclerosis, relapsing-remitting ([facility]CC)  - amphetamine-dextroamphetamine (ADDERALL XR) 20 MG 24 hr capsule; Take 1 capsule (20 mg total) by  mouth every morning.  Dispense: 90 capsule; Refill: 0  4. Pituitary microadenoma (HCC)  Prolactin   5. Depression, major, recurrent, mild (HCC)  - amphetamine-dextroamphetamine (ADDERALL XR) 20 MG 24 hr capsule; Take 1 capsule (20 mg total) by mouth every morning.  Dispense: 90 capsule; Refill: 0  6. Mild intermittent asthma without complication   7. Attention deficit hyperactivity disorder (ADHD), combined type  - amphetamine-dextroamphetamine (ADDERALL XR) 20 MG 24 hr capsule; Take 1 capsule (20 mg total) by mouth every morning.  Dispense: 90 capsule; Refill: 0 - amphetamine-dextroamphetamine (ADDERALL) 10 MG tablet; Take 1 tablet (10 mg total) by mouth daily as needed. On weekends when you sleep in  Dispense: 30 tablet; Refill: 0  8. Insomnia, persistent   9. Pre-diabetes  - Hemoglobin A1c  10. Perennial allergic rhinitis with seasonal variation  - fluticasone (FLONASE) 50 MCG/ACT nasal spray; Place 2 sprays into both nostrils daily.  Dispense: 48 g; Refill: 1  11. Other insomnia  - QUEtiapine (SEROQUEL) 25 MG tablet; Take 1 tablet (25 mg total) by mouth at bedtime.  Dispense: 90 tablet; Refill: 1  12. Vitamin D deficiency  - VITAMIN D 25 Hydroxy (Vit-D Deficiency, Fractures)  13. B12 deficiency  - Vitamin B12  14. Lipid screening  - Lipid panel  15. Long-term use of high-risk medication  - CBC with Differential/Platelet - COMPLETE METABOLIC PANEL WITH GFR

## 2018-04-26 DIAGNOSIS — E538 Deficiency of other specified B group vitamins: Secondary | ICD-10-CM | POA: Diagnosis not present

## 2018-04-26 DIAGNOSIS — E559 Vitamin D deficiency, unspecified: Secondary | ICD-10-CM | POA: Diagnosis not present

## 2018-04-26 DIAGNOSIS — D352 Benign neoplasm of pituitary gland: Secondary | ICD-10-CM | POA: Diagnosis not present

## 2018-04-26 DIAGNOSIS — Z79899 Other long term (current) drug therapy: Secondary | ICD-10-CM | POA: Diagnosis not present

## 2018-04-26 DIAGNOSIS — R7303 Prediabetes: Secondary | ICD-10-CM | POA: Diagnosis not present

## 2018-04-27 LAB — LIPID PANEL
Chol/HDL Ratio: 3.3 ratio (ref 0.0–4.4)
Cholesterol, Total: 190 mg/dL (ref 100–199)
HDL: 58 mg/dL (ref >39)
LDL Calculated: 107 mg/dL — ABNORMAL HIGH (ref 0–99)
Triglycerides: 123 mg/dL (ref 0–149)
VLDL Cholesterol Cal: 25 mg/dL (ref 5–40)

## 2018-04-27 LAB — COMPREHENSIVE METABOLIC PANEL
ALT: 11 IU/L (ref 0–32)
AST: 15 IU/L (ref 0–40)
Albumin/Globulin Ratio: 1.7 (ref 1.2–2.2)
Albumin: 4.3 g/dL (ref 3.5–5.5)
Alkaline Phosphatase: 93 IU/L (ref 39–117)
BUN/Creatinine Ratio: 13 (ref 9–23)
BUN: 14 mg/dL (ref 6–24)
Bilirubin Total: 0.3 mg/dL (ref 0.0–1.2)
CO2: 26 mmol/L (ref 20–29)
Calcium: 9.5 mg/dL (ref 8.7–10.2)
Chloride: 99 mmol/L (ref 96–106)
Creatinine, Ser: 1.08 mg/dL — ABNORMAL HIGH (ref 0.57–1.00)
GFR calc Af Amer: 71 mL/min/{1.73_m2} (ref >59)
GFR calc non Af Amer: 62 mL/min/{1.73_m2} (ref >59)
Globulin, Total: 2.6 g/dL (ref 1.5–4.5)
Glucose: 95 mg/dL (ref 65–99)
Potassium: 4.9 mmol/L (ref 3.5–5.2)
Sodium: 139 mmol/L (ref 134–144)
Total Protein: 6.9 g/dL (ref 6.0–8.5)

## 2018-04-27 LAB — CBC WITH DIFFERENTIAL/PLATELET
Basophils Absolute: 0.1 10*3/uL (ref 0.0–0.2)
Basos: 1 %
EOS (ABSOLUTE): 0.1 10*3/uL (ref 0.0–0.4)
Eos: 1 %
Hematocrit: 38.2 % (ref 34.0–46.6)
Hemoglobin: 12.5 g/dL (ref 11.1–15.9)
Immature Grans (Abs): 0.1 10*3/uL (ref 0.0–0.1)
Immature Granulocytes: 1 %
Lymphocytes Absolute: 2.3 10*3/uL (ref 0.7–3.1)
Lymphs: 32 %
MCH: 30.9 pg (ref 26.6–33.0)
MCHC: 32.7 g/dL (ref 31.5–35.7)
MCV: 94 fL (ref 79–97)
Monocytes Absolute: 0.6 10*3/uL (ref 0.1–0.9)
Monocytes: 9 %
Neutrophils Absolute: 3.9 10*3/uL (ref 1.4–7.0)
Neutrophils: 56 %
Platelets: 452 10*3/uL — ABNORMAL HIGH (ref 150–450)
RBC: 4.05 x10E6/uL (ref 3.77–5.28)
RDW: 11.9 % — ABNORMAL LOW (ref 12.3–15.4)
WBC: 7 10*3/uL (ref 3.4–10.8)

## 2018-04-27 LAB — VITAMIN D 25 HYDROXY (VIT D DEFICIENCY, FRACTURES): Vit D, 25-Hydroxy: 30.7 ng/mL (ref 30.0–100.0)

## 2018-04-27 LAB — HEMOGLOBIN A1C
Est. average glucose Bld gHb Est-mCnc: 114 mg/dL
Hgb A1c MFr Bld: 5.6 % (ref 4.8–5.6)

## 2018-04-27 LAB — PROLACTIN: Prolactin: 11.8 ng/mL (ref 4.8–23.3)

## 2018-04-27 LAB — VITAMIN B12: Vitamin B-12: 516 pg/mL (ref 232–1245)

## 2018-05-08 ENCOUNTER — Encounter: Payer: Self-pay | Admitting: Family Medicine

## 2018-05-08 ENCOUNTER — Ambulatory Visit (INDEPENDENT_AMBULATORY_CARE_PROVIDER_SITE_OTHER): Payer: BLUE CROSS/BLUE SHIELD | Admitting: Family Medicine

## 2018-05-08 VITALS — BP 118/78 | HR 99 | Temp 98.3°F | Ht 70.0 in | Wt 194.0 lb

## 2018-05-08 DIAGNOSIS — Z01419 Encounter for gynecological examination (general) (routine) without abnormal findings: Secondary | ICD-10-CM | POA: Diagnosis not present

## 2018-05-08 DIAGNOSIS — Z113 Encounter for screening for infections with a predominantly sexual mode of transmission: Secondary | ICD-10-CM | POA: Diagnosis not present

## 2018-05-08 NOTE — Patient Instructions (Signed)
Preventive Care 40-64 Years, Female Preventive care refers to lifestyle choices and visits with your health care provider that can promote health and wellness. What does preventive care include?  A yearly physical exam. This is also called an annual well check.  Dental exams once or twice a year.  Routine eye exams. Ask your health care provider how often you should have your eyes checked.  Personal lifestyle choices, including: ? Daily care of your teeth and gums. ? Regular physical activity. ? Eating a healthy diet. ? Avoiding tobacco and drug use. ? Limiting alcohol use. ? Practicing safe sex. ? Taking low-dose aspirin daily starting at age 77. ? Taking vitamin and mineral supplements as recommended by your health care provider. What happens during an annual well check? The services and screenings done by your health care provider during your annual well check will depend on your age, overall health, lifestyle risk factors, and family history of disease. Counseling Your health care provider may ask you questions about your:  Alcohol use.  Tobacco use.  Drug use.  Emotional well-being.  Home and relationship well-being.  Sexual activity.  Eating habits.  Work and work Statistician.  Method of birth control.  Menstrual cycle.  Pregnancy history.  Screening You may have the following tests or measurements:  Height, weight, and BMI.  Blood pressure.  Lipid and cholesterol levels. These may be checked every 5 years, or more frequently if you are over 62 years old.  Skin check.  Lung cancer screening. You may have this screening every year starting at age 24 if you have a 30-pack-year history of smoking and currently smoke or have quit within the past 15 years.  Fecal occult blood test (FOBT) of the stool. You may have this test every year starting at age 42.  Flexible sigmoidoscopy or colonoscopy. You may have a sigmoidoscopy every 5 years or a colonoscopy  every 10 years starting at age 55.  Hepatitis C blood test.  Hepatitis B blood test.  Sexually transmitted disease (STD) testing.  Diabetes screening. This is done by checking your blood sugar (glucose) after you have not eaten for a while (fasting). You may have this done every 1-3 years.  Mammogram. This may be done every 1-2 years. Talk to your health care provider about when you should start having regular mammograms. This may depend on whether you have a family history of breast cancer.  BRCA-related cancer screening. This may be done if you have a family history of breast, ovarian, tubal, or peritoneal cancers.  Pelvic exam and Pap test. This may be done every 3 years starting at age 83. Starting at age 11, this may be done every 5 years if you have a Pap test in combination with an HPV test.  Bone density scan. This is done to screen for osteoporosis. You may have this scan if you are at high risk for osteoporosis.  Discuss your test results, treatment options, and if necessary, the need for more tests with your health care provider. Vaccines Your health care provider may recommend certain vaccines, such as:  Influenza vaccine. This is recommended every year.  Tetanus, diphtheria, and acellular pertussis (Tdap, Td) vaccine. You may need a Td booster every 10 years.  Varicella vaccine. You may need this if you have not been vaccinated.  Zoster vaccine. You may need this after age 55.  Measles, mumps, and rubella (MMR) vaccine. You may need at least one dose of MMR if you were born in  1956 or later. You may also need a second dose.  Pneumococcal 13-valent conjugate (PCV13) vaccine. You may need this if you have certain conditions and were not previously vaccinated.  Pneumococcal polysaccharide (PPSV23) vaccine. You may need one or two doses if you smoke cigarettes or if you have certain conditions.  Meningococcal vaccine. You may need this if you have certain  conditions.  Hepatitis A vaccine. You may need this if you have certain conditions or if you travel or work in places where you may be exposed to hepatitis A.  Hepatitis B vaccine. You may need this if you have certain conditions or if you travel or work in places where you may be exposed to hepatitis B.  Haemophilus influenzae type b (Hib) vaccine. You may need this if you have certain conditions.  Talk to your health care provider about which screenings and vaccines you need and how often you need them. This information is not intended to replace advice given to you by your health care provider. Make sure you discuss any questions you have with your health care provider. Document Released: 08/08/2015 Document Revised: 03/31/2016 Document Reviewed: 05/13/2015 Elsevier Interactive Patient Education  [employer].

## 2018-05-08 NOTE — Progress Notes (Signed)
Name: Robin Chan   MRN: [id number]    DOB: 11-15-1970   Date:05/08/2018       Progress Note  Subjective  Chief Complaint  Chief Complaint  Patient presents with  . Annual Exam    HPI   Patient presents for annual CPE   Diet: packing lunch and trying healthier snacks Exercise: not currently , discussed importance of 150 minutes of activity per week.   USPSTF grade A and B recommendations    Office Visit from 05/08/2018 in [facility]  AUDIT-C Score  4      Depression:  Depression screen [facility] 2/9 05/08/2018 04/07/2018 01/04/2018 10/04/2017 07/06/2017  Decreased Interest 0 0 _0 Down, Depressed, Hopeless 0 0 _1 PHQ - 2 Score 0 0 _2 Altered sleeping 0 1 2 0 0  Tired, decreased energy 1 0 _3 Change in appetite 0 _4 Feeling bad or failure about yourself  0 0 0 0 0  Trouble concentrating 3 0 0 0 2  Moving slowly or fidgety/restless 0 0 1 0 2  Suicidal thoughts 0 0 1 0 0  PHQ-9 Score _5 Difficult doing work/chores Somewhat difficult Not difficult at all Somewhat difficult Not difficult at all Somewhat difficult  Some recent data might be hidden   Hypertension: BP Readings from Last 3 Encounters:  05/08/18 118/78  04/07/18 114/62  02/02/18 110/70   Obesity: Wt Readings from Last 3 Encounters:  05/08/18 194 lb (88 kg)  04/07/18 195 lb 8 oz (88.7 kg)  02/02/18 200 lb (90.7 kg)   BMI Readings from Last 3 Encounters:  05/08/18 27.84 kg/m  04/07/18 28.05 kg/m  02/02/18 28.70 kg/m    Hep C Screening: today  STD testing and prevention (HIV/chl/gon/syphilis): today Intimate partner violence: negative screen  Sexual History/Pain during Intercourse: no pain during intercourse Menstrual History/LMP/Abnormal Bleeding: s/p hysterectomy  Incontinence Symptoms: very seldom when sneezing  Advanced Care Planning: A voluntary discussion about advance care planning including the explanation and discussion of advance  directives.  Discussed health care proxy and Living will, and the patient was able to identify a health care proxy as mother  Patient does not have a living will at present time.  Breast cancer: needs to have mammogram  BRCA gene screening: N/A Cervical cancer screening: not indicated  Osteoporosis Screening: discussed increase in calcium and vitamin D   Lipids:  Lab Results  Component Value Date   CHOL 190 04/26/2018   CHOL 184 03/09/2017   CHOL 167 03/15/2016   Lab Results  Component Value Date   HDL 58 04/26/2018   HDL 62 03/09/2017   HDL 67 03/15/2016   Lab Results  Component Value Date   LDLCALC 107 (H) 04/26/2018   LDLCALC 101 (H) 03/09/2017   LDLCALC 85 03/15/2016   Lab Results  Component Value Date   TRIG 123 04/26/2018   TRIG 107 03/09/2017   TRIG 74 03/15/2016   Lab Results  Component Value Date   CHOLHDL 3.3 04/26/2018   CHOLHDL 3.0 03/09/2017   CHOLHDL 2.5 03/15/2016   No results found for: LDLDIRECT  Glucose:  Glucose  Date Value Ref Range Status  04/26/2018 95 65 - 99 mg/dL Final  03/09/2017 99 65 - 99 mg/dL Final  03/15/2016 97 65 - 99 mg/dL Final    Skin cancer: discussed atypical lesions  Colorectal cancer: discussed screen, she will  walk to GI today, lost follow up  ECG: 2012   Patient Active Problem List   Diagnosis Date Noted  . Pelvic pain 04/07/2017  . Medication management 04/07/2017  . Rectocele 07/08/2015  . Status post laparoscopic assisted vaginal hysterectomy (LAVH) 07/08/2015  . History of hysterectomy for benign disease 05/26/2015  . Left lumbar radiculitis 03/28/2015  . Breast cancer screening 02/05/2015  . Abnormal CAT scan 12/31/2014  . ADD (attention deficit disorder) 12/31/2014  . Allergic to bees 12/31/2014  . Back muscle spasm 12/31/2014  . Insomnia, persistent 12/31/2014  . Constipation 12/31/2014  . Depression, major, recurrent, mild ([place]) 12/31/2014  . Personal history of traumatic fracture 12/31/2014  .  Asthma, mild intermittent 12/31/2014  . Migraine with aura and without status migrainosus, not intractable 12/31/2014  . Multiple sclerosis, relapsing-remitting ([place]) 12/31/2014  . Endometriosis determined by laparoscopy 12/31/2014  . Perennial allergic rhinitis 12/31/2014  . Pituitary microadenoma ([place]) 12/31/2014  . Vitamin D deficiency 12/31/2014  . Acquired trigger finger 12/31/2014    Past Surgical History:  Procedure Laterality Date  . ABDOMINAL HYSTERECTOMY  05/26/2015  . BREAST BIOPSY Right 2005   benign  . LAPAROSCOPIC ASSISTED VAGINAL HYSTERECTOMY N/A 05/26/2015   Procedure: LAPAROSCOPIC ASSISTED VAGINAL HYSTERECTOMY, with right salpingectomy;  Surgeon: Brayton Mars, MD;  Location: [facility] ORS;  Service: Gynecology;  Laterality: N/A;  . OVARY SURGERY Left 09/30/2014  . RECTOCELE REPAIR N/A 05/26/2015   Procedure: POSTERIOR REPAIR (RECTOCELE);  Surgeon: Brayton Mars, MD;  Location: [facility] ORS;  Service: Gynecology;  Laterality: N/A;    Family History  Problem Relation Age of Onset  . Anemia Mother   . Osteoporosis Mother   . Mental illness Father        Bipolar  . Arthritis Brother   . Breast cancer Maternal Aunt     Social History   Socioeconomic History  . Marital status: Married    Spouse name: Not on file  . Number of children: 2  . Years of education: Not on file  . Highest education level: Associate degree: academic program  Occupational History  . Occupation: Proofreader     Comment: [employer]   Social Needs  . Financial resource strain: Not hard at all  . Food insecurity:    Worry: Never true    Inability: Never true  . Transportation needs:    Medical: No    Non-medical: No  Tobacco Use  . Smoking status: Never Smoker  . Smokeless tobacco: Never Used  Substance and Sexual Activity  . Alcohol use: Yes    Alcohol/week: 14.0 standard drinks    Types: 14 Shots of liquor per week    Comment: occasionally, 1-2 shots every night  .  Drug use: No  . Sexual activity: Not Currently    Partners: Male    Comment: separated   Lifestyle  . Physical activity:    Days per week: 0 days    Minutes per session: 0 min  . Stress: Rather much  Relationships  . Social connections:    Talks on phone: More than three times a week    Gets together: More than three times a week    Attends religious service: Never    Active member of club or organization: No    Attends meetings of clubs or organizations: Never    Relationship status: Married  . Intimate partner violence:    Fear of current or ex partner: No    Emotionally abused: No  Physically abused: No    Forced sexual activity: No  Other Topics Concern  . Not on file  Social History Narrative   She is from [place].    Moved to [place] in 1998 because of husband's job transfer, that is when she stopped working -pending her green-card.   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    She had symptoms of MS in 2000 , daughter was 75 year old and just now found a job at [employer] March 2019   Separated since July 2019         Current Outpatient Medications:  .  albuterol (PROVENTIL HFA;VENTOLIN HFA) 108 (90 BASE) MCG/ACT inhaler, Inhale 1 puff into the lungs every 6 (six) hours as needed for wheezing or shortness of breath. Reported on 09/26/2015, Disp: , Rfl:  .  amphetamine-dextroamphetamine (ADDERALL XR) 20 MG 24 hr capsule, Take 1 capsule (20 mg total) by mouth every morning., Disp: 90 capsule, Rfl: 0 .  cetirizine (ZYRTEC ALLERGY) 10 MG tablet, Take 10 mg by mouth daily., Disp: , Rfl:  .  Cholecalciferol (VITAMIN D) 2000 units tablet, Take 2,000 Units by mouth daily., Disp: , Rfl:  .  DULoxetine (CYMBALTA) 30 MG capsule, Take 3 capsules (90 mg total) by mouth daily. [facility] psychiatrist, Disp: 270 capsule, Rfl: 0 .  EPINEPHrine 0.3 mg/0.3 mL IJ SOAJ injection, Inject 0.3 mg into the muscle once. Reported on 09/26/2015, Disp: , Rfl:  .  fluticasone (FLONASE) 50 MCG/ACT nasal spray,  Place 2 sprays into both nostrils daily., Disp: 48 g, Rfl: 1 .  levocetirizine (XYZAL) 5 MG tablet, Take 1 tablet (5 mg total) by mouth every evening., Disp: 90 tablet, Rfl: 4 .  montelukast (SINGULAIR) 10 MG tablet, Take 1 tablet (10 mg total) by mouth at bedtime., Disp: 90 tablet, Rfl: 1 .  polyethylene glycol powder (GLYCOLAX/MIRALAX) powder, Take 17 g by mouth daily., Disp: 3350 g, Rfl: 0 .  QUEtiapine (SEROQUEL) 25 MG tablet, Take 1 tablet (25 mg total) by mouth at bedtime., Disp: 90 tablet, Rfl: 1 .  vitamin B-12 (CYANOCOBALAMIN) 1000 MCG tablet, Take 1,000 mcg by mouth daily., Disp: , Rfl:  .  amphetamine-dextroamphetamine (ADDERALL) 10 MG tablet, Take 1 tablet (10 mg total) by mouth daily as needed. On weekends when you sleep in (Patient not taking: Reported on 05/08/2018), Disp: 30 tablet, Rfl: 0  Allergies  Allergen Reactions  . Penicillins Anaphylaxis  . Bee Venom     Bee     ROS  Constitutional: Negative for fever or weight change.  Respiratory: Negative for cough and shortness of breath.   Cardiovascular: Negative for chest pain or palpitations.  Gastrointestinal: Negative for abdominal pain, no bowel changes.  Musculoskeletal: Positive for intermittent gait problem but no joint swelling.  Skin: Negative for rash.  Neurological: Negative for dizziness or headache.  No other specific complaints in a complete review of systems (except as listed in HPI above).  Objective  Vitals:   05/08/18 0942  BP: 118/78  Pulse: 99  Temp: 98.3 F (36.8 C)  SpO2: 98%  Weight: 194 lb (88 kg)  Height: _0  (1.778 m)    Body mass index is 27.84 kg/m.  Physical Exam  Constitutional: Patient appears well-developed and well-nourished. No distress.  HENT: Head: Normocephalic and atraumatic. Ears: B TMs ok, no erythema or effusion; Nose: Nose normal. Mouth/Throat: Oropharynx is clear and moist. No oropharyngeal exudate.  Eyes: Conjunctivae and EOM are normal. Pupils are equal,  round, and reactive  to light. No scleral icterus.  Neck: Normal range of motion. Neck supple. No JVD present. No thyromegaly present.  Cardiovascular: Normal rate, regular rhythm and normal heart sounds.  No murmur heard. No BLE edema. Pulmonary/Chest: Effort normal and breath sounds normal. No respiratory distress. Abdominal: Soft. Bowel sounds are normal, no distension. There is no tenderness. no masses Breast: no lumps or masses, no nipple discharge or rashes FEMALE GENITALIA:  External genitalia normal External urethra normal Pelvic not done s/p hysterectomy  RECTAL: not done Musculoskeletal: Normal range of motion, no joint effusions. No gross deformities Neurological: he is alert and oriented to person, place, and time. No cranial nerve deficit. Coordination, balance, strength, speech and gait are normal.  Skin: Skin is warm and dry. No rash noted. No erythema.  Psychiatric: Patient has a normal mood and affect. behavior is normal. Judgment and thought content normal.   Recent Results (from the past 2160 hour(s))  Prolactin     Status: None   Collection Time: 04/26/18  1:15 PM  Result Value Ref Range   Prolactin 11.8 4.8 - 23.3 ng/mL  Comprehensive metabolic panel     Status: Abnormal   Collection Time: 04/26/18  1:15 PM  Result Value Ref Range   Glucose 95 65 - 99 mg/dL   BUN 14 6 - 24 mg/dL   Creatinine, Ser 1.08 (H) 0.57 - 1.00 mg/dL   GFR calc non Af Amer 62 >59 mL/min/1.73   GFR calc Af Amer 71 >59 mL/min/1.73   BUN/Creatinine Ratio 13 9 - 23   Sodium 139 134 - 144 mmol/L   Potassium 4.9 3.5 - 5.2 mmol/L   Chloride 99 96 - 106 mmol/L   CO2 26 20 - 29 mmol/L   Calcium 9.5 8.7 - 10.2 mg/dL   Total Protein 6.9 6.0 - 8.5 g/dL   Albumin 4.3 3.5 - 5.5 g/dL   Globulin, Total 2.6 1.5 - 4.5 g/dL   Albumin/Globulin Ratio 1.7 1.2 - 2.2   Bilirubin Total 0.3 0.0 - 1.2 mg/dL   Alkaline Phosphatase 93 39 - 117 IU/L   AST 15 0 - 40 IU/L   ALT 11 0 - 32 IU/L  CBC with  Differential/Platelet     Status: Abnormal   Collection Time: 04/26/18  1:15 PM  Result Value Ref Range   WBC 7.0 3.4 - 10.8 x10E3/uL   RBC 4.05 3.77 - 5.28 x10E6/uL   Hemoglobin 12.5 11.1 - 15.9 g/dL   Hematocrit 38.2 34.0 - 46.6 %   MCV 94 79 - 97 fL   MCH 30.9 26.6 - 33.0 pg   MCHC 32.7 31.5 - 35.7 g/dL   RDW 11.9 (L) 12.3 - 15.4 %   Platelets 452 (H) 150 - 450 x10E3/uL   Neutrophils 56 Not Estab. %   Lymphs 32 Not Estab. %   Monocytes 9 Not Estab. %   Eos 1 Not Estab. %   Basos 1 Not Estab. %   Neutrophils Absolute 3.9 1.4 - 7.0 x10E3/uL   Lymphocytes Absolute 2.3 0.7 - 3.1 x10E3/uL   Monocytes Absolute 0.6 0.1 - 0.9 x10E3/uL   EOS (ABSOLUTE) 0.1 0.0 - 0.4 x10E3/uL   Basophils Absolute 0.1 0.0 - 0.2 x10E3/uL   Immature Granulocytes 1 Not Estab. %   Immature Grans (Abs) 0.1 0.0 - 0.1 x10E3/uL  VITAMIN D 25 Hydroxy (Vit-D Deficiency, Fractures)     Status: None   Collection Time: 04/26/18  1:15 PM  Result Value Ref Range   Vit D,  25-Hydroxy 30.7 30.0 - 100.0 ng/mL    Comment: Vitamin D deficiency has been defined by the [place] practice guideline as a level of serum 25-OH vitamin D less than 20 ng/mL (1,2). The Endocrine Society went on to further define vitamin D insufficiency as a level between 21 and 29 ng/mL (2). 1. IOM (Institute of Medicine). 2010. Dietary reference    intakes for calcium and D. [place]: The    [employer]. 2. Holick MF, Binkley Ashburn, Bischoff-Ferrari HA, et al.    Evaluation, treatment, and prevention of vitamin D    deficiency: an Endocrine Society clinical practice    guideline. JCEM. 2011 Jul; 96(7):1911-30.   Vitamin B12     Status: None   Collection Time: 04/26/18  1:15 PM  Result Value Ref Range   Vitamin B-12 516 232 - 1,245 pg/mL  Lipid panel     Status: Abnormal   Collection Time: 04/26/18  1:15 PM  Result Value Ref Range   Cholesterol, Total 190 100 - 199 mg/dL   Triglycerides  123 0 - 149 mg/dL   HDL 58 >39 mg/dL   VLDL Cholesterol Cal 25 5 - 40 mg/dL   LDL Calculated 107 (H) 0 - 99 mg/dL   Chol/HDL Ratio 3.3 0.0 - 4.4 ratio    Comment:                                   T. Chol/HDL Ratio                                             Men  Women                               1/2 Avg.Risk  3.4    3.3                                   Avg.Risk  5.0    4.4                                2X Avg.Risk  9.6    7.1                                3X Avg.Risk 23.4   11.0   Hemoglobin A1c     Status: None   Collection Time: 04/26/18  1:15 PM  Result Value Ref Range   Hgb A1c MFr Bld 5.6 4.8 - 5.6 %    Comment:          Prediabetes: 5.7 - 6.4          Diabetes: >6.4          Glycemic control for adults with diabetes: <7.0    Est. average glucose Bld gHb Est-mCnc 114 mg/dL     PHQ2/9: Depression screen [facility] 2/9 05/08/2018 04/07/2018 01/04/2018 10/04/2017 07/06/2017  Decreased Interest 0 0 _0 Down, Depressed, Hopeless 0 0 _1 PHQ - 2 Score 0 0 5 2  5  Altered sleeping 0 1 2 0 0  Tired, decreased energy 1 0 _0 Change in appetite 0 _1 Feeling bad or failure about yourself  0 0 0 0 0  Trouble concentrating 3 0 0 0 2  Moving slowly or fidgety/restless 0 0 1 0 2  Suicidal thoughts 0 0 1 0 0  PHQ-9 Score _2 Difficult doing work/chores Somewhat difficult Not difficult at all Somewhat difficult Not difficult at all Somewhat difficult  Some recent data might be hidden     Fall Risk: Fall Risk  05/08/2018 04/07/2018 02/02/2018 01/04/2018 10/04/2017  Falls in the past year? Yes No Yes No No  Number falls in past yr: 2 or more - 1 - -  Injury with Fall? No - No - -  Comment - - - - -  Risk Factor Category  - - - - -  Comment - - - - -  Risk for fall due to : - - - - -  Follow up - - - - -     Functional Status Survey: Is the patient deaf or have difficulty hearing?: No Does the patient have difficulty seeing, even when wearing  glasses/contacts?: No Does the patient have difficulty concentrating, remembering, or making decisions?: Yes Does the patient have difficulty walking or climbing stairs?: Yes Does the patient have difficulty dressing or bathing?: No Does the patient have difficulty doing errands alone such as visiting a doctor's office or shopping?: No   Assessment & Plan   1. Well woman exam  She needs to schedule colonoscopy and also mammogram - already ordered   2. Routine screening for STI (sexually transmitted infection)  - GC/Chlamydia Probe Amp([facility]) - Hepatitis, Acute - HIV Antibody (routine testing w rflx) - RPR  -USPSTF grade A and B recommendations reviewed with patient; age-appropriate recommendations, preventive care, screening tests, etc discussed and encouraged; healthy living encouraged; see AVS for patient education given to patient -Discussed importance of 150 minutes of physical activity weekly, eat two servings of fish weekly, eat one serving of tree nuts ( cashews, pistachios, pecans, almonds.Marland Kitchen) every other day, eat 6 servings of fruit/vegetables daily and drink plenty of water and avoid sweet beverages.

## 2018-05-09 DIAGNOSIS — M5416 Radiculopathy, lumbar region: Secondary | ICD-10-CM | POA: Diagnosis not present

## 2018-05-22 DIAGNOSIS — M5416 Radiculopathy, lumbar region: Secondary | ICD-10-CM | POA: Diagnosis not present

## 2018-06-07 DIAGNOSIS — M5416 Radiculopathy, lumbar region: Secondary | ICD-10-CM | POA: Diagnosis not present

## 2018-06-14 DIAGNOSIS — F332 Major depressive disorder, recurrent severe without psychotic features: Secondary | ICD-10-CM | POA: Diagnosis not present

## 2018-06-14 DIAGNOSIS — F5105 Insomnia due to other mental disorder: Secondary | ICD-10-CM | POA: Diagnosis not present

## 2018-06-14 DIAGNOSIS — F902 Attention-deficit hyperactivity disorder, combined type: Secondary | ICD-10-CM | POA: Diagnosis not present

## 2018-07-07 ENCOUNTER — Ambulatory Visit: Payer: BLUE CROSS/BLUE SHIELD | Admitting: Nurse Practitioner

## 2018-07-07 ENCOUNTER — Other Ambulatory Visit (HOSPITAL_COMMUNITY)
Admission: RE | Admit: 2018-07-07 | Discharge: 2018-07-07 | Disposition: A | Discharge status: Home / Self Care | Payer: BLUE CROSS/BLUE SHIELD | Source: Ambulatory Visit | Attending: Nurse Practitioner | Admitting: Nurse Practitioner

## 2018-07-07 ENCOUNTER — Encounter: Payer: Self-pay | Admitting: Nurse Practitioner

## 2018-07-07 ENCOUNTER — Ambulatory Visit
Admission: RE | Admit: 2018-07-07 | Discharge: 2018-07-07 | Disposition: A | Discharge status: Home / Self Care | Payer: BLUE CROSS/BLUE SHIELD | Attending: Nurse Practitioner | Admitting: Nurse Practitioner

## 2018-07-07 ENCOUNTER — Ambulatory Visit
Admission: RE | Admit: 2018-07-07 | Discharge: 2018-07-07 | Disposition: A | Discharge status: Home / Self Care | Payer: BLUE CROSS/BLUE SHIELD | Source: Ambulatory Visit | Attending: Nurse Practitioner | Admitting: Nurse Practitioner

## 2018-07-07 ENCOUNTER — Ambulatory Visit: Payer: Self-pay | Admitting: *Deleted

## 2018-07-07 VITALS — BP 134/68 | HR 98 | Temp 98.7°F | Resp 16 | Ht 70.0 in | Wt 197.5 lb

## 2018-07-07 DIAGNOSIS — R1031 Right lower quadrant pain: Secondary | ICD-10-CM | POA: Diagnosis not present

## 2018-07-07 DIAGNOSIS — N898 Other specified noninflammatory disorders of vagina: Secondary | ICD-10-CM | POA: Diagnosis not present

## 2018-07-07 DIAGNOSIS — R1032 Left lower quadrant pain: Secondary | ICD-10-CM

## 2018-07-07 DIAGNOSIS — M79675 Pain in left toe(s): Secondary | ICD-10-CM | POA: Insufficient documentation

## 2018-07-07 DIAGNOSIS — R319 Hematuria, unspecified: Secondary | ICD-10-CM | POA: Diagnosis not present

## 2018-07-07 DIAGNOSIS — K5900 Constipation, unspecified: Secondary | ICD-10-CM

## 2018-07-07 DIAGNOSIS — S92512A Displaced fracture of proximal phalanx of left lesser toe(s), initial encounter for closed fracture: Secondary | ICD-10-CM | POA: Diagnosis not present

## 2018-07-07 DIAGNOSIS — R300 Dysuria: Secondary | ICD-10-CM | POA: Diagnosis not present

## 2018-07-07 LAB — POCT URINALYSIS DIPSTICK
Bilirubin, UA: NEGATIVE
Glucose, UA: NEGATIVE
Ketones, UA: NEGATIVE
Nitrite, UA: NEGATIVE
Protein, UA: NEGATIVE
Spec Grav, UA: 1.01 (ref 1.010–1.025)
Urobilinogen, UA: 0.2 E.U./dL
pH, UA: 8 (ref 5.0–8.0)

## 2018-07-07 MED ORDER — PHENAZOPYRIDINE HCL 95 MG PO TABS: 95.0000 mg | ORAL_TABLET | Freq: Two times a day (BID) | ORAL | 1 refills | Status: DC | PRN

## 2018-07-07 MED ORDER — POLYETHYLENE GLYCOL 3350 17 GM/SCOOP PO POWD: 17.0000 g | Freq: Every day | ORAL | 0 refills | Status: DC

## 2018-07-07 NOTE — Progress Notes (Addendum)
Name: Robin Chan   MRN: [id number]    DOB: [birth year]   Date:07/07/2018       Progress Note  Subjective  Chief Complaint  Chief Complaint  Patient presents with  . Urinary Tract Infection    patient presents with pain and burning upon urination for the past 3 days. previously felt feverish and had some abdominal pain/ pressure  . Foot Injury    left pinky toe - possible fracture    HPI  Dysuria Patient presents with pain and burning upon urination for the past 3 days. previously felt feverish and had some lower back and abdominal pain/ pressure. Fever symptoms have resolved but lower abdominal pain still present but milder.   Vaginal discharge Patient noted today she had some discharge in panties that was brownish discharge. Sexually active, no protection, monogomous relationship does not douche, No pain with sex.   Pinky tow pain (left) Patient was carrying laundry basket and ran into a wagon wheel 2 weeks ago. Pinky toe is swollen, and very painful. Has broken it in the past. Still very painful. Taking naproxen every few days if pain is unmanageable.  Constipation  patient notes increased in constipation; takes miralax in the past with relief of symptoms.   Patient Active Problem List   Diagnosis Date Noted  . Pelvic pain 04/07/2017  . Medication management 04/07/2017  . Rectocele 07/08/2015  . Status post laparoscopic assisted vaginal hysterectomy (LAVH) 07/08/2015  . History of hysterectomy for benign disease 05/26/2015  . Left lumbar radiculitis 03/28/2015  . Breast cancer screening 02/05/2015  . Abnormal CAT scan 12/31/2014  . ADD (attention deficit disorder) 12/31/2014  . Allergic to bees 12/31/2014  . Back muscle spasm 12/31/2014  . Insomnia, persistent 12/31/2014  . Constipation 12/31/2014  . Depression, major, recurrent, mild ([place]) 12/31/2014  . Personal history of traumatic fracture 12/31/2014  . Asthma, mild intermittent 12/31/2014  . Migraine with aura  and without status migrainosus, not intractable 12/31/2014  . Multiple sclerosis, relapsing-remitting ([place]) 12/31/2014  . Endometriosis determined by laparoscopy 12/31/2014  . Perennial allergic rhinitis 12/31/2014  . Pituitary microadenoma ([place]) 12/31/2014  . Vitamin D deficiency 12/31/2014  . Acquired trigger finger 12/31/2014    Past Medical History:  Diagnosis Date  . ADHD (attention deficit hyperactivity disorder)   . Allergy   . Anxiety   . Chronic pelvic pain in female   . Complex ovarian cyst   . Depression   . Dyspareunia   . Endometriosis   . Fibroid   . GERD (gastroesophageal reflux disease)   . Heavy period   . MS (multiple sclerosis) ([place])   . Painful menstruation   . Rectocele     Past Surgical History:  Procedure Laterality Date  . ABDOMINAL HYSTERECTOMY  05/26/2015  . BREAST BIOPSY Right 2005   benign  . LAPAROSCOPIC ASSISTED VAGINAL HYSTERECTOMY N/A 05/26/2015   Procedure: LAPAROSCOPIC ASSISTED VAGINAL HYSTERECTOMY, with right salpingectomy;  Surgeon: Brayton Mars, MD;  Location: [facility];  Service: Gynecology;  Laterality: N/A;  . OVARY SURGERY Left 09/30/2014  . RECTOCELE REPAIR N/A 05/26/2015   Procedure: POSTERIOR REPAIR (RECTOCELE);  Surgeon: Brayton Mars, MD;  Location: [facility];  Service: Gynecology;  Laterality: N/A;    Social History   Tobacco Use  . Smoking status: Never Smoker  . Smokeless tobacco: Never Used  Substance Use Topics  . Alcohol use: Yes    Alcohol/week: 14.0 standard drinks    Types: 14 Shots of liquor per week  Comment: occasionally, 1-2 shots every night     Current Outpatient Medications:  .  albuterol (PROVENTIL HFA;VENTOLIN HFA) 108 (90 BASE) MCG/ACT inhaler, Inhale 1 puff into the lungs every 6 (six) hours as needed for wheezing or shortness of breath. Reported on 09/26/2015, Disp: , Rfl:  .  amphetamine-dextroamphetamine (ADDERALL XR) 20 MG 24 hr capsule, Take 1 capsule (20 mg total) by mouth every  morning., Disp: 90 capsule, Rfl: 0 .  amphetamine-dextroamphetamine (ADDERALL) 10 MG tablet, Take 1 tablet (10 mg total) by mouth daily as needed. On weekends when you sleep in (Patient not taking: Reported on 05/08/2018), Disp: 30 tablet, Rfl: 0 .  cetirizine (ZYRTEC ALLERGY) 10 MG tablet, Take 10 mg by mouth daily., Disp: , Rfl:  .  Cholecalciferol (VITAMIN D) 2000 units tablet, Take 2,000 Units by mouth daily., Disp: , Rfl:  .  DULoxetine (CYMBALTA) 30 MG capsule, Take 3 capsules (90 mg total) by mouth daily. [facility] psychiatrist, Disp: 270 capsule, Rfl: 0 .  EPINEPHrine 0.3 mg/0.3 mL IJ SOAJ injection, Inject 0.3 mg into the muscle once. Reported on 09/26/2015, Disp: , Rfl:  .  fluticasone (FLONASE) 50 MCG/ACT nasal spray, Place 2 sprays into both nostrils daily., Disp: 48 g, Rfl: 1 .  levocetirizine (XYZAL) 5 MG tablet, Take 1 tablet (5 mg total) by mouth every evening., Disp: 90 tablet, Rfl: 4 .  montelukast (SINGULAIR) 10 MG tablet, Take 1 tablet (10 mg total) by mouth at bedtime., Disp: 90 tablet, Rfl: 1 .  polyethylene glycol powder (GLYCOLAX/MIRALAX) powder, Take 17 g by mouth daily., Disp: 3350 g, Rfl: 0 .  QUEtiapine (SEROQUEL) 25 MG tablet, Take 1 tablet (25 mg total) by mouth at bedtime., Disp: 90 tablet, Rfl: 1 .  vitamin B-12 (CYANOCOBALAMIN) 1000 MCG tablet, Take 1,000 mcg by mouth daily., Disp: , Rfl:   Allergies  Allergen Reactions  . Penicillins Anaphylaxis  . Bee Venom     Bee    ROS   No other specific complaints in a complete review of systems (except as listed in HPI above).  Objective  Vitals:   07/07/18 1137  BP: 134/68  Pulse: 98  Resp: 16  Temp: 98.7 F (37.1 C)  TempSrc: Oral  SpO2: 99%  Weight: 197 lb 8 oz (89.6 kg)    Body mass index is 28.34 kg/m.  Nursing Note and Vital Signs reviewed.  Physical Exam Vitals signs reviewed. Exam conducted with a chaperone present.  Constitutional:      Appearance: She is well-developed.  HENT:     Head:  Normocephalic and atraumatic.  Neck:     Musculoskeletal: Normal range of motion and neck supple.     Vascular: No carotid bruit.  Cardiovascular:     Heart sounds: Normal heart sounds.  Pulmonary:     Effort: Pulmonary effort is normal.     Breath sounds: Normal breath sounds.  Abdominal:     General: Bowel sounds are normal.     Palpations: Abdomen is soft.     Tenderness: There is no abdominal tenderness.  Genitourinary:    General: Normal vulva.     Exam position: Knee-chest position.     Pubic Area: No rash.      Labia:        Right: No rash or tenderness.        Left: No rash or tenderness.      Vagina: No signs of injury and foreign body. Vaginal discharge and tenderness (thick white discharge) present. No  erythema or bleeding.  Musculoskeletal: Normal range of motion.       Feet:  Skin:    General: Skin is warm and dry.     Capillary Refill: Capillary refill takes less than 2 seconds.  Neurological:     Mental Status: She is alert and oriented to person, place, and time.     GCS: GCS eye subscore is 4. GCS verbal subscore is 5. GCS motor subscore is 6.     Sensory: No sensory deficit.  Psychiatric:        Speech: Speech normal.        Behavior: Behavior normal.        Thought Content: Thought content normal.        Judgment: Judgment normal.      No results found for this or any previous visit (from the past 48 hour(s)).  Assessment & Plan  1. Burning with urination - POCT urinalysis dipstick - phenazopyridine (PYRIDIUM) 95 MG tablet; Take 1 tablet (95 mg total) by mouth 2 (two) times daily as needed for pain.  Dispense: 6 tablet; Refill: 1 - Urine Culture - Urinalysis, Routine w reflex microscopic  2. Pain of toe of left foot - DG Toe 5th Left; Future  ADDENDUM: xray showed acute fracture patient returned to office for buddy tape 3. Hematuria, unspecified type May have been irritations  - CBC with Differential - Urinalysis, Routine w reflex  microscopic  4. Bilateral lower abdominal cramping Discussed ER precautions- possibly BV, and constipation  - CBC with Differential - Comprehensive Metabolic Panel (CMET) - Cervicovaginal ancillary only  5. Vaginal discharge [facility] typically states will use [facility] due to issues with [facility] equipment; appears to be BV - Cervicovaginal ancillary only  6. Constipation, unspecified constipation type Increase fiber and fluds - polyethylene glycol powder (GLYCOLAX/MIRALAX) powder; Take 17 g by mouth daily.  Dispense: 3350 g; Refill: 0   Toe: - Rest, elevate, and protect - Can use meloxicam OR naproxen for pain - Please buddy tape toe as discussed for further comfort after x-ray.  BUDDY TAPE: Put a small piece of cotton or gauze between the toes that are taped together.  Using as little tape as necessary, loosely tape the broken toe to the toe next to it. If the toes are taped too tightly it can cause additional swelling and may cut off circulation to the injured toe.  Pain when urinating and discharge - Please drink plenty of water  - Take prescribed pyridium OR OTC azo for dysuria - We will order further medications based of lab results. - If you do not receive a phone call within the next week about results please call  - If you develop fever, chills, worsening pain, nausea or vomiting pleas

## 2018-07-07 NOTE — Telephone Encounter (Signed)
This morning pt had a  bloody discharge (dk red) on her panties about little smaller than a quarter. She is also having lower abd pain, like she is coming down with something. Having burning with urination. Urine was dark this morning but this last time was a bright yellow.  Drinking lots of fluids.  Appointment scheduled for this morning per protocol. Routing to [facility].  Reason for Disposition . Side (flank) or lower back pain present  Answer Assessment - Initial Assessment Questions 1. SYMPTOM: "What's the main symptom you're concerned about?" (e.g., frequency, incontinence)     Bloody discharge on panties and low abd discomfort 2. ONSET: "When did the  UTI  start?"     This morning 3. PAIN: "Is there any pain?" If so, ask: "How bad is it?" (Scale: 1-10; mild, moderate, severe)     yes 4. CAUSE: "What do you think is causing the symptoms?"     UTI 5. OTHER SYMPTOMS: "Do you have any other symptoms?" (e.g., fever, flank pain, blood in urine, pain with urination)    Burning with urination, lower abd pain 6. PREGNANCY: "Is there any chance you are pregnant?" "When was your last menstrual period?"     Not pregnant, hysterectomy  Protocols used: URINARY [facility]

## 2018-07-07 NOTE — Patient Instructions (Addendum)
Toe: - Rest, elevate, and protect - Can use meloxicam OR naproxen for pain - Please buddy tape toe as discussed for further comfort after x-ray.  BUDDY TAPE: Put a small piece of cotton or gauze between the toes that are taped together.  Using as little tape as necessary, loosely tape the broken toe to the toe next to it. If the toes are taped too tightly it can cause additional swelling and may cut off circulation to the injured toe.  Pain when urinating and discharge - Please drink plenty of water  - Take prescribed pyridium OR OTC azo for dysuria - We will order further medications based of lab results. - If you do not receive a phone call within the next week about results please call  - If you develop fever, chills, worsening pain, nausea or vomiting please get urgent medication attention

## 2018-07-10 ENCOUNTER — Other Ambulatory Visit: Payer: Self-pay | Admitting: Nurse Practitioner

## 2018-07-10 DIAGNOSIS — N3090 Cystitis, unspecified without hematuria: Secondary | ICD-10-CM

## 2018-07-10 LAB — URINE CULTURE
MICRO NUMBER:: 91496635
SPECIMEN QUALITY:: ADEQUATE

## 2018-07-10 LAB — URINALYSIS, ROUTINE W REFLEX MICROSCOPIC
Bilirubin Urine: NEGATIVE
Glucose, UA: NEGATIVE
Hgb urine dipstick: NEGATIVE
Ketones, ur: NEGATIVE
Leukocytes, UA: NEGATIVE
Nitrite: NEGATIVE
Protein, ur: NEGATIVE
Specific Gravity, Urine: 1.016 (ref 1.001–1.03)
pH: >=8.5 — AB (ref 5.0–8.0)

## 2018-07-10 MED ORDER — NITROFURANTOIN MONOHYD MACRO 100 MG PO CAPS: 100.0000 mg | ORAL_CAPSULE | Freq: Two times a day (BID) | ORAL | 0 refills | Status: DC

## 2018-07-11 ENCOUNTER — Other Ambulatory Visit: Payer: Self-pay | Admitting: Nurse Practitioner

## 2018-07-11 ENCOUNTER — Encounter: Payer: Self-pay | Admitting: Family Medicine

## 2018-07-11 ENCOUNTER — Ambulatory Visit: Payer: BLUE CROSS/BLUE SHIELD | Admitting: Family Medicine

## 2018-07-11 VITALS — BP 120/80 | HR 85 | Temp 98.3°F | Resp 16 | Ht 70.0 in | Wt 197.7 lb

## 2018-07-11 DIAGNOSIS — F330 Major depressive disorder, recurrent, mild: Secondary | ICD-10-CM | POA: Diagnosis not present

## 2018-07-11 DIAGNOSIS — G35 Multiple sclerosis: Secondary | ICD-10-CM | POA: Diagnosis not present

## 2018-07-11 DIAGNOSIS — G8929 Other chronic pain: Secondary | ICD-10-CM

## 2018-07-11 DIAGNOSIS — F902 Attention-deficit hyperactivity disorder, combined type: Secondary | ICD-10-CM

## 2018-07-11 DIAGNOSIS — M5442 Lumbago with sciatica, left side: Secondary | ICD-10-CM

## 2018-07-11 DIAGNOSIS — K5900 Constipation, unspecified: Secondary | ICD-10-CM

## 2018-07-11 DIAGNOSIS — J4520 Mild intermittent asthma, uncomplicated: Secondary | ICD-10-CM | POA: Diagnosis not present

## 2018-07-11 DIAGNOSIS — M79675 Pain in left toe(s): Secondary | ICD-10-CM

## 2018-07-11 LAB — CERVICOVAGINAL ANCILLARY ONLY
Bacterial vaginitis: POSITIVE — AB
Candida vaginitis: NEGATIVE
Chlamydia: NEGATIVE
Neisseria Gonorrhea: NEGATIVE
Trichomonas: NEGATIVE

## 2018-07-11 MED ORDER — AMPHETAMINE-DEXTROAMPHET ER 20 MG PO CP24: 20.0000 mg | ORAL_CAPSULE | ORAL | 0 refills | Status: DC

## 2018-07-11 MED ORDER — POLYETHYLENE GLYCOL 3350 17 GM/SCOOP PO POWD: 17.0000 g | Freq: Two times a day (BID) | ORAL | 1 refills | Status: DC | PRN

## 2018-07-11 MED ORDER — METRONIDAZOLE 500 MG PO TABS: 500.0000 mg | ORAL_TABLET | Freq: Two times a day (BID) | ORAL | 0 refills | Status: DC

## 2018-07-11 MED ORDER — MONTELUKAST SODIUM 10 MG PO TABS: 10.0000 mg | ORAL_TABLET | Freq: Every day | ORAL | 1 refills | Status: DC

## 2018-07-11 MED ORDER — POLYETHYLENE GLYCOL 3350 17 GM/SCOOP PO POWD: 17.0000 g | Freq: Every day | ORAL | 0 refills | Status: DC

## 2018-07-11 MED ORDER — AMPHETAMINE-DEXTROAMPHETAMINE 10 MG PO TABS: 10.0000 mg | ORAL_TABLET | Freq: Every day | ORAL | 0 refills | Status: DC | PRN

## 2018-07-11 MED ORDER — MELOXICAM 7.5 MG PO TABS: 7.5000 mg | ORAL_TABLET | Freq: Every day | ORAL | 0 refills | Status: DC

## 2018-07-11 NOTE — Progress Notes (Signed)
Name: Robin Chan   MRN: [id number]    DOB: 1970-10-10   Date:07/11/2018       Progress Note  Subjective  Chief Complaint  Chief Complaint  Patient presents with  . Medication Refill    3 month F/U  . Asthma  . ADD  . Back Pain  . Insomnia  . Depression  . Multiple Sclerosis    HPI  ADD: taking medication daily as prescribed, on Adderal XR 20 mg now and she states she has been feeling well with medication, no side effects.She states she feels very sluggishand no energy or ability to focus without medication. She is working now, since March 2019 helps with her focus, she lives in an apartment since July 2019 when she separated from her husband, sometimes wakes up very late on weekends she cannot the XR, we gave her a rx for immediate release but not covered by insurance, we will try sending to local pharmacy and she may have to pay cash for it.   Low back pain: she was seen here with left radiculitis on 07/11 took a round of prednisone taper and symptoms improved a little, but still having daily low back pain , no longer has radiculitis. She saw Dr. Catalina Antigua at [facility], had MRI lumbar spine and was diagnosed with DDD and was given meloxicam and methocarbamol prn and is doing well at this time  Fractured left toe: happened two weeks ago, she uses a wagon to do her wagon, she hit the wheel of the wagon and developed pain and swelling, she has been body taping, X-ray reviewed taking meloxicam for pain   MS: diagnosed in 2000, but first symptoms in 1991 - she felt numb on the right side and weakness. Currently off medication because of multiple side effects. Has intermittent weakness and has used a cane periodically, for left foot drop - worse when she is tired and when it rains. Adderall helps with energy level. She still has left leg numbness intermittent - can last from hours to days. She started part time work at [employer] 03/19. She is trying to go to work full time, she is  not sure if she can doe it, but will need to pay for her insurance next year.   Insomnia: she has been off Ambien, we stoppedit on theSpring 2018. She is taking Seroquel low dose qhs and helps her fall asleep ( takes about 10-20 minutes ) , she states she has been sleeping better since living alone. Occasionally wakes up because of her cat, unchanged   Major Depression: she was taking Lexapro for many years and depression was not controlled, we switched to Cymbalta in June 2017, She is separated. Seeing a psychiatrist at [facility] on high dose duloxetine 90 mg, denies side effects, Phq 9 improved   Asthma: she is taking singulairprn,she has dry coughing spells. She states usually triggered by getting hot. Resolves once she cools off. She denies wheezing or SOB. She has not had a flare in a long time. She needs refill of singulair   AR: rhinorrhea or nasal congestion controlled as long as she takes Xyzal, Zyrtec and flonase daily, she needs refill of singulair . Unchanged   Pituitary microadenoma: no significant change since 2011 -last one was 2019, diagnosed a mass possible Rathke's cleft cyst, last prolactin normal   UTI: seen by Melody Comas last week, she has not started antibiotic yet, explained it is at pharmacy , still has mild dysuria.   Patient  Active Problem List   Diagnosis Date Noted  . Pelvic pain 04/07/2017  . Medication management 04/07/2017  . Rectocele 07/08/2015  . Status post laparoscopic assisted vaginal hysterectomy (LAVH) 07/08/2015  . History of hysterectomy for benign disease 05/26/2015  . Left lumbar radiculitis 03/28/2015  . Breast cancer screening 02/05/2015  . Abnormal CAT scan 12/31/2014  . ADD (attention deficit disorder) 12/31/2014  . Allergic to bees 12/31/2014  . Back muscle spasm 12/31/2014  . Insomnia, persistent 12/31/2014  . Constipation 12/31/2014  . Depression, major, recurrent, mild ([place]) 12/31/2014  . Personal history of traumatic fracture  12/31/2014  . Asthma, mild intermittent 12/31/2014  . Migraine with aura and without status migrainosus, not intractable 12/31/2014  . Multiple sclerosis, relapsing-remitting ([place]) 12/31/2014  . Endometriosis determined by laparoscopy 12/31/2014  . Perennial allergic rhinitis 12/31/2014  . Pituitary microadenoma ([place]) 12/31/2014  . Vitamin D deficiency 12/31/2014  . Acquired trigger finger 12/31/2014    Past Surgical History:  Procedure Laterality Date  . ABDOMINAL HYSTERECTOMY  05/26/2015  . BREAST BIOPSY Right 2005   benign  . LAPAROSCOPIC ASSISTED VAGINAL HYSTERECTOMY N/A 05/26/2015   Procedure: LAPAROSCOPIC ASSISTED VAGINAL HYSTERECTOMY, with right salpingectomy;  Surgeon: Brayton Mars, MD;  Location: [facility] ORS;  Service: Gynecology;  Laterality: N/A;  . OVARY SURGERY Left 09/30/2014  . RECTOCELE REPAIR N/A 05/26/2015   Procedure: POSTERIOR REPAIR (RECTOCELE);  Surgeon: Brayton Mars, MD;  Location: [facility] ORS;  Service: Gynecology;  Laterality: N/A;    Family History  Problem Relation Age of Onset  . Anemia Mother   . Osteoporosis Mother   . Mental illness Father        Bipolar  . Arthritis Brother   . Breast cancer Maternal Aunt     Social History   Socioeconomic History  . Marital status: Legally Separated    Spouse name: Not on file  . Number of children: 2  . Years of education: Not on file  . Highest education level: Associate degree: academic program  Occupational History  . Occupation: Proofreader     Comment: [employer]   Social Needs  . Financial resource strain: Not hard at all  . Food insecurity:    Worry: Never true    Inability: Never true  . Transportation needs:    Medical: No    Non-medical: No  Tobacco Use  . Smoking status: Never Smoker  . Smokeless tobacco: Never Used  Substance and Sexual Activity  . Alcohol use: Yes    Alcohol/week: 14.0 standard drinks    Types: 14 Shots of liquor per week    Comment: occasionally,  1-2 shots every night  . Drug use: No  . Sexual activity: Yes    Partners: Male    Birth control/protection: None    Comment: separated   Lifestyle  . Physical activity:    Days per week: 0 days    Minutes per session: 0 min  . Stress: Rather much  Relationships  . Social connections:    Talks on phone: More than three times a week    Gets together: More than three times a week    Attends religious service: Never    Active member of club or organization: No    Attends meetings of clubs or organizations: Never    Relationship status: Married  . Intimate partner violence:    Fear of current or ex partner: No    Emotionally abused: No    Physically abused: No  Forced sexual activity: No  Other Topics Concern  . Not on file  Social History Narrative   She is from [place].    Moved to [place] in 1998 because of husband's job transfer, that is when she stopped working -pending her green-card.   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    She had symptoms of MS in 2000 , daughter was 2 year old and just now found a job at [employer] March 2019   Separated since July 2019         Current Outpatient Medications:  .  albuterol (PROVENTIL HFA;VENTOLIN HFA) 108 (90 BASE) MCG/ACT inhaler, Inhale 1 puff into the lungs every 6 (six) hours as needed for wheezing or shortness of breath. Reported on 09/26/2015, Disp: , Rfl:  .  amphetamine-dextroamphetamine (ADDERALL XR) 20 MG 24 hr capsule, Take 1 capsule (20 mg total) by mouth every morning., Disp: 90 capsule, Rfl: 0 .  amphetamine-dextroamphetamine (ADDERALL) 10 MG tablet, Take 1 tablet (10 mg total) by mouth daily as needed. On weekends when you sleep in, Disp: 30 tablet, Rfl: 0 .  cetirizine (ZYRTEC ALLERGY) 10 MG tablet, Take 10 mg by mouth daily., Disp: , Rfl:  .  Cholecalciferol (VITAMIN D) 2000 units tablet, Take 2,000 Units by mouth daily., Disp: , Rfl:  .  desogestrel-ethinyl estradiol (APRI,EMOQUETTE,SOLIA) 0.15-30 MG-MCG tablet, Take by  mouth., Disp: , Rfl:  .  DULoxetine (CYMBALTA) 30 MG capsule, Take 3 capsules (90 mg total) by mouth daily. [facility] psychiatrist, Disp: 270 capsule, Rfl: 0 .  EPINEPHrine 0.3 mg/0.3 mL IJ SOAJ injection, Inject 0.3 mg into the muscle once. Reported on 09/26/2015, Disp: , Rfl:  .  fluticasone (FLONASE) 50 MCG/ACT nasal spray, Place 2 sprays into both nostrils daily., Disp: 48 g, Rfl: 1 .  levocetirizine (XYZAL) 5 MG tablet, Take 1 tablet (5 mg total) by mouth every evening., Disp: 90 tablet, Rfl: 4 .  meloxicam (MOBIC) 7.5 MG tablet, Take 1 tablet by mouth daily., Disp: , Rfl:  .  methocarbamol (ROBAXIN-750) 750 MG tablet, Robaxin-750  750 mg tablet  Take 1 tablet twice a day by oral route for 30 days., Disp: , Rfl:  .  montelukast (SINGULAIR) 10 MG tablet, Take 1 tablet (10 mg total) by mouth at bedtime., Disp: 90 tablet, Rfl: 1 .  nitrofurantoin, macrocrystal-monohydrate, (MACROBID) 100 MG capsule, Take 1 capsule (100 mg total) by mouth 2 (two) times daily., Disp: 14 capsule, Rfl: 0 .  phenazopyridine (PYRIDIUM) 95 MG tablet, Take 1 tablet (95 mg total) by mouth 2 (two) times daily as needed for pain., Disp: 6 tablet, Rfl: 1 .  polyethylene glycol powder (GLYCOLAX/MIRALAX) powder, Take 17 g by mouth daily., Disp: 3350 g, Rfl: 0 .  QUEtiapine (SEROQUEL) 25 MG tablet, Take 1 tablet (25 mg total) by mouth at bedtime., Disp: 90 tablet, Rfl: 1 .  vitamin B-12 (CYANOCOBALAMIN) 1000 MCG tablet, Take 1,000 mcg by mouth daily., Disp: , Rfl:  .  polyethylene glycol powder (GLYCOLAX/MIRALAX) powder, Take 17 g by mouth 2 (two) times daily as needed., Disp: 3350 g, Rfl: 1  Allergies  Allergen Reactions  . Penicillins Anaphylaxis  . Bee Venom     Bee    I personally reviewed active problem list, medication list, allergies, family history, social history with the patient/caregiver today.   ROS  Constitutional: Negative for fever or weight change.  Respiratory: Negative for cough and shortness of breath.    Cardiovascular: Negative for chest pain or palpitations.  Gastrointestinal: Negative for abdominal pain, no bowel changes.  Musculoskeletal: Positive  for gait problem - she broke her 5th toe left foot, and mild swelling.  Skin: Negative for rash.  Neurological: Negative for dizziness or headache.  No other specific complaints in a complete review of systems (except as listed in HPI above).   Objective  Vitals:   07/11/18 0839  BP: 120/80  Pulse: 85  Resp: 16  Temp: 98.3 F (36.8 C)  TempSrc: Oral  SpO2: 99%  Weight: 197 lb 11.2 oz (89.7 kg)  Height: 5\' 10"  (1.778 m)    Body mass index is 28.37 kg/m.  Physical Exam  Constitutional: Patient appears well-developed and well-nourished. Overweight.  No distress.  HEENT: head atraumatic, normocephalic, pupils equal and reactive to light,neck supple, throat within normal limits Cardiovascular: Normal rate, regular rhythm and normal heart sounds.  No murmur heard. No BLE edema. Pulmonary/Chest: Effort normal and breath sounds normal. No respiratory distress. Abdominal: Soft.  There is no tenderness. Psychiatric: Patient has a normal mood and affect. behavior is normal. Judgment and thought content normal. Muscular Skeletal: pain and swelling of left 5th toe    Recent Results (from the past 2160 hour(s))  Prolactin     Status: None   Collection Time: 04/26/18  1:15 PM  Result Value Ref Range   Prolactin 11.8 4.8 - 23.3 ng/mL  Comprehensive metabolic panel     Status: Abnormal   Collection Time: 04/26/18  1:15 PM  Result Value Ref Range   Glucose 95 65 - 99 mg/dL   BUN 14 6 - 24 mg/dL   Creatinine, Ser 1.08 (H) 0.57 - 1.00 mg/dL   GFR calc non Af Amer 62 >59 mL/min/1.73   GFR calc Af Amer 71 >59 mL/min/1.73   BUN/Creatinine Ratio 13 9 - 23   Sodium 139 134 - 144 mmol/L   Potassium 4.9 3.5 - 5.2 mmol/L   Chloride 99 96 - 106 mmol/L   CO2 26 20 - 29 mmol/L   Calcium 9.5 8.7 - 10.2 mg/dL   Total Protein 6.9 6.0 - 8.5 g/dL    Albumin 4.3 3.5 - 5.5 g/dL   Globulin, Total 2.6 1.5 - 4.5 g/dL   Albumin/Globulin Ratio 1.7 1.2 - 2.2   Bilirubin Total 0.3 0.0 - 1.2 mg/dL   Alkaline Phosphatase 93 39 - 117 IU/L   AST 15 0 - 40 IU/L   ALT 11 0 - 32 IU/L  CBC with Differential/Platelet     Status: Abnormal   Collection Time: 04/26/18  1:15 PM  Result Value Ref Range   WBC 7.0 3.4 - 10.8 x10E3/uL   RBC 4.05 3.77 - 5.28 x10E6/uL   Hemoglobin 12.5 11.1 - 15.9 g/dL   Hematocrit 38.2 34.0 - 46.6 %   MCV 94 79 - 97 fL   MCH 30.9 26.6 - 33.0 pg   MCHC 32.7 31.5 - 35.7 g/dL   RDW 11.9 (L) 12.3 - 15.4 %   Platelets 452 (H) 150 - 450 x10E3/uL   Neutrophils 56 Not Estab. %   Lymphs 32 Not Estab. %   Monocytes 9 Not Estab. %   Eos 1 Not Estab. %   Basos 1 Not Estab. %   Neutrophils Absolute 3.9 1.4 - 7.0 x10E3/uL   Lymphocytes Absolute 2.3 0.7 - 3.1 x10E3/uL   Monocytes Absolute 0.6 0.1 - 0.9 x10E3/uL   EOS (ABSOLUTE) 0.1 0.0 - 0.4 x10E3/uL   Basophils Absolute 0.1 0.0 - 0.2 x10E3/uL   Immature  Granulocytes 1 Not Estab. %   Immature Grans (Abs) 0.1 0.0 - 0.1 x10E3/uL  VITAMIN D 25 Hydroxy (Vit-D Deficiency, Fractures)     Status: None   Collection Time: 04/26/18  1:15 PM  Result Value Ref Range   Vit D, 25-Hydroxy 30.7 30.0 - 100.0 ng/mL    Comment: Vitamin D deficiency has been defined by the [place] practice guideline as a level of serum 25-OH vitamin D less than 20 ng/mL (1,2). The Endocrine Society went on to further define vitamin D insufficiency as a level between 21 and 29 ng/mL (2). 1. IOM (Institute of Medicine). 2010. Dietary reference    intakes for calcium and D. [place]: The    [employer]. 2. Holick MF, Binkley [place], Bischoff-Ferrari HA, et al.    Evaluation, treatment, and prevention of vitamin D    deficiency: an Endocrine Society clinical practice    guideline. JCEM. 2011 Jul; 96(7):1911-30.   Vitamin B12     Status: None   Collection  Time: 04/26/18  1:15 PM  Result Value Ref Range   Vitamin B-12 516 232 - 1,245 pg/mL  Lipid panel     Status: Abnormal   Collection Time: 04/26/18  1:15 PM  Result Value Ref Range   Cholesterol, Total 190 100 - 199 mg/dL   Triglycerides 123 0 - 149 mg/dL   HDL 58 >39 mg/dL   VLDL Cholesterol Cal 25 5 - 40 mg/dL   LDL Calculated 107 (H) 0 - 99 mg/dL   Chol/HDL Ratio 3.3 0.0 - 4.4 ratio    Comment:                                   T. Chol/HDL Ratio                                             Men  Women                               1/2 Avg.Risk  3.4    3.3                                   Avg.Risk  5.0    4.4                                2X Avg.Risk  9.6    7.1                                3X Avg.Risk 23.4   11.0   Hemoglobin A1c     Status: None   Collection Time: 04/26/18  1:15 PM  Result Value Ref Range   Hgb A1c MFr Bld 5.6 4.8 - 5.6 %    Comment:          Prediabetes: 5.7 - 6.4          Diabetes: >6.4          Glycemic control for adults with diabetes: <7.0    Est. average  glucose Bld gHb Est-mCnc 114 mg/dL  POCT urinalysis dipstick     Status: Abnormal   Collection Time: 07/07/18 11:50 AM  Result Value Ref Range   Color, UA yellow    Clarity, UA clear    Glucose, UA Negative Negative   Bilirubin, UA negative    Ketones, UA negative    Spec Grav, UA 1.010 1.010 - 1.025   Blood, UA hemolyzed trace    pH, UA 8.0 5.0 - 8.0   Protein, UA Negative Negative   Urobilinogen, UA 0.2 0.2 or 1.0 E.U./dL   Nitrite, UA negative    Leukocytes, UA Small (1+) (A) Negative   Appearance clear    Odor strong   Urinalysis, Routine w reflex microscopic     Status: Abnormal   Collection Time: 07/07/18 12:08 PM  Result Value Ref Range   Color, Urine YELLOW YELLOW   APPearance CLEAR CLEAR   Specific Gravity, Urine 1.016 1.001 - 1.03   pH > OR = 8.5 (A) 5.0 - 8.0   Glucose, UA NEGATIVE NEGATIVE   Bilirubin Urine NEGATIVE NEGATIVE   Ketones, ur NEGATIVE NEGATIVE   Hgb urine  dipstick NEGATIVE NEGATIVE   Protein, ur NEGATIVE NEGATIVE   Nitrite NEGATIVE NEGATIVE   Leukocytes, UA NEGATIVE NEGATIVE  Urine Culture     Status: Abnormal   Collection Time: 07/07/18 12:08 PM  Result Value Ref Range   MICRO NUMBER: [id number]    SPECIMEN QUALITY: Adequate    Sample Source URINE, CLEAN CATCH    STATUS: FINAL    ISOLATE 1: Escherichia coli (A)     Comment: 10,000-50,000 CFU/mL of Escherichia coli      Susceptibility   Escherichia coli - URINE CULTURE, REFLEX    AMOX/CLAVULANIC 4 Sensitive     AMPICILLIN >=32 Resistant     AMPICILLIN/SULBACTAM 4 Sensitive     CEFAZOLIN* <=4 Not Reportable      * For infections other than uncomplicated UTIcaused by E. coli, K. pneumoniae or P. mirabilis:Cefazolin is resistant if MIC > or = 8 mcg/mL.(Distinguishing susceptible versus intermediatefor isolates with MIC < or = 4 mcg/mL requiresadditional testing.)For uncomplicated UTI caused by E. coli,K. pneumoniae or P. mirabilis: Cefazolin issusceptible if MIC <32 mcg/mL and predictssusceptible to the oral agents cefaclor, cefdinir,cefpodoxime, cefprozil, cefuroxime, cephalexinand loracarbef.    CEFEPIME <=1 Sensitive     CEFTRIAXONE <=1 Sensitive     CIPROFLOXACIN <=0.25 Sensitive     LEVOFLOXACIN <=0.12 Sensitive     ERTAPENEM <=0.5 Sensitive     GENTAMICIN <=1 Sensitive     IMIPENEM <=0.25 Sensitive     NITROFURANTOIN <=16 Sensitive     PIP/TAZO <=4 Sensitive     TOBRAMYCIN <=1 Sensitive     TRIMETH/SULFA* >=320 Resistant      * For infections other than uncomplicated UTIcaused by E. coli, K. pneumoniae or P. mirabilis:Cefazolin is resistant if MIC > or = 8 mcg/mL.(Distinguishing susceptible versus intermediatefor isolates with MIC < or = 4 mcg/mL requiresadditional testing.)For uncomplicated UTI caused by E. coli,K. pneumoniae or P. mirabilis: Cefazolin issusceptible if MIC <32 mcg/mL and predictssusceptible to the oral agents cefaclor, cefdinir,cefpodoxime, cefprozil, cefuroxime,  cephalexinand loracarbef.Legend:S = Susceptible  I = IntermediateR = Resistant  NS = Not susceptible* = Not tested  NR = Not reported**NN = See antimicrobic comments     PHQ2/9: Depression screen [facility] 2/9 07/07/2018 05/08/2018 04/07/2018 01/04/2018 10/04/2017  Decreased Interest 0 0 0 2 1  Down, Depressed, Hopeless 0 0 0 3 1  PHQ -  2 Score 0 0 0 5 2  Altered sleeping 0 0 1 2 0  Tired, decreased energy 1 1 0 1 1  Change in appetite 0 0 1 2 2   Feeling bad or failure about yourself  0 0 0 0 0  Trouble concentrating 0 3 0 0 0  Moving slowly or fidgety/restless 0 0 0 1 0  Suicidal thoughts - 0 0 1 0  PHQ-9 Score 1 4 2 12 5   Difficult doing work/chores Not difficult at all Somewhat difficult Not difficult at all Somewhat difficult Not difficult at all  Some recent data might be hidden     Fall Risk: Fall Risk  07/11/2018 07/11/2018 07/07/2018 05/08/2018 04/07/2018  Falls in the past year? 0 1 1 Yes No  Number falls in past yr: - 1 1 2  or more -  Injury with Fall? - 1 1 No -  Comment - - - - -  Risk Factor Category  - - - - -  Comment - - - - -  Risk for fall due to : - History of fall(s) History of fall(s) - -  Follow up - - - - -      Assessment & Plan  1. Multiple sclerosis, relapsing-remitting (HCC)  - amphetamine-dextroamphetamine (ADDERALL XR) 20 MG 24 hr capsule; Take 1 capsule (20 mg total) by mouth every morning.  Dispense: 90 capsule; Refill: 0  2. Depression, major, recurrent, mild (HCC)  - amphetamine-dextroamphetamine (ADDERALL XR) 20 MG 24 hr capsule; Take 1 capsule (20 mg total) by mouth every morning.  Dispense: 90 capsule; Refill: 0  3. Attention deficit hyperactivity disorder (ADHD), combined type  - amphetamine-dextroamphetamine (ADDERALL XR) 20 MG 24 hr capsule; Take 1 capsule (20 mg total) by mouth every morning.  Dispense: 90 capsule; Refill: 0 - amphetamine-dextroamphetamine (ADDERALL) 10 MG tablet; Take 1 tablet (10 mg total) by mouth daily as needed. On  weekends when you sleep in  Dispense: 30 tablet; Refill: 0  4. Mild intermittent asthma without complication  - montelukast (SINGULAIR) 10 MG tablet; Take 1 tablet (10 mg total) by mouth at bedtime.  Dispense: 90 tablet; Refill: 1  5. Constipation, unspecified constipation type  - polyethylene glycol powder (GLYCOLAX/MIRALAX) powder; Take 17 g by mouth daily.  Dispense: 3350 g; Refill: 0  6. Pain of toe of left foot  Body tape, reviewed x-ray   7. Chronic left-sided low back pain with left-sided sciatica  - meloxicam (MOBIC) 7.5 MG tablet; Take 1 tablet (7.5 mg total) by mouth daily.  Dispense: 90 tablet; Refill: 0

## 2018-08-09 DIAGNOSIS — G894 Chronic pain syndrome: Secondary | ICD-10-CM | POA: Diagnosis not present

## 2018-08-10 ENCOUNTER — Encounter: Payer: Self-pay | Admitting: Family Medicine

## 2018-08-10 ENCOUNTER — Ambulatory Visit: Payer: BLUE CROSS/BLUE SHIELD | Admitting: Family Medicine

## 2018-08-10 VITALS — BP 136/82 | HR 103 | Temp 98.1°F | Resp 18 | Ht 70.0 in | Wt 198.8 lb

## 2018-08-10 DIAGNOSIS — M79675 Pain in left toe(s): Secondary | ICD-10-CM

## 2018-08-10 NOTE — Progress Notes (Signed)
Name: Robin Chan   MRN: [id number]    DOB: February 12, 1971   Date:08/10/2018       Progress Note  Subjective  Chief Complaint  Chief Complaint  Patient presents with  . Follow-up    4 week F/U  . Foot Injury    Has been taping her toes together and takes Meloxicam for pain relief.   . Urinary Tract Infection    Has resolved with antibiotic     HPI  UTI follow up: he is doing well, symptoms resolved within a day of taking medication. No fever or chills  Left toe pain: she was seen by Robin Lundborg, NP back in Dec 2019, had X-ray proximal fith phalanx fracture, she body taped for a while, states edema and pain has improved. Previous fracture back in 2012 on the same spot. Discussed referring back to Dr. Elvina Chan but she wants to hold off for now.    Patient Active Problem List   Diagnosis Date Noted  . Pelvic pain 04/07/2017  . Medication management 04/07/2017  . Rectocele 07/08/2015  . Status post laparoscopic assisted vaginal hysterectomy (LAVH) 07/08/2015  . History of hysterectomy for benign disease 05/26/2015  . Left lumbar radiculitis 03/28/2015  . Breast cancer screening 02/05/2015  . Abnormal CAT scan 12/31/2014  . ADD (attention deficit disorder) 12/31/2014  . Allergic to bees 12/31/2014  . Back muscle spasm 12/31/2014  . Insomnia, persistent 12/31/2014  . Constipation 12/31/2014  . Depression, major, recurrent, mild ([place]) 12/31/2014  . Personal history of traumatic fracture 12/31/2014  . Asthma, mild intermittent 12/31/2014  . Migraine with aura and without status migrainosus, not intractable 12/31/2014  . Multiple sclerosis, relapsing-remitting ([place]) 12/31/2014  . Endometriosis determined by laparoscopy 12/31/2014  . Perennial allergic rhinitis 12/31/2014  . Pituitary microadenoma ([facility]) 12/31/2014  . Vitamin D deficiency 12/31/2014  . Acquired trigger finger 12/31/2014    Past Surgical History:  Procedure Laterality Date  . ABDOMINAL HYSTERECTOMY   05/26/2015  . BREAST BIOPSY Right 2005   benign  . LAPAROSCOPIC ASSISTED VAGINAL HYSTERECTOMY N/A 05/26/2015   Procedure: LAPAROSCOPIC ASSISTED VAGINAL HYSTERECTOMY, with right salpingectomy;  Surgeon: Brayton Mars, MD;  Location: [facility] ORS;  Service: Gynecology;  Laterality: N/A;  . OVARY SURGERY Left 09/30/2014  . RECTOCELE REPAIR N/A 05/26/2015   Procedure: POSTERIOR REPAIR (RECTOCELE);  Surgeon: Brayton Mars, MD;  Location: [facility];  Service: Gynecology;  Laterality: N/A;    Family History  Problem Relation Age of Onset  . Anemia Mother   . Osteoporosis Mother   . Mental illness Father        Bipolar  . Arthritis Brother   . Breast cancer Maternal Aunt     Social History   Socioeconomic History  . Marital status: Legally Separated    Spouse name: Not on file  . Number of children: 2  . Years of education: Not on file  . Highest education level: Associate degree: academic program  Occupational History  . Occupation: Proofreader     Comment: [employer]   Social Needs  . Financial resource strain: Not hard at all  . Food insecurity:    Worry: Never true    Inability: Never true  . Transportation needs:    Medical: No    Non-medical: No  Tobacco Use  . Smoking status: Never Smoker  . Smokeless tobacco: Never Used  Substance and Sexual Activity  . Alcohol use: Yes    Alcohol/week: 14.0 standard drinks  Types: 14 Shots of liquor per week    Comment: occasionally, 1-2 shots every night  . Drug use: No  . Sexual activity: Yes    Partners: Male    Birth control/protection: None    Comment: separated   Lifestyle  . Physical activity:    Days per week: 0 days    Minutes per session: 0 min  . Stress: Rather much  Relationships  . Social connections:    Talks on phone: More than three times a week    Gets together: More than three times a week    Attends religious service: Never    Active member of club or organization: No    Attends meetings  of clubs or organizations: Never    Relationship status: Married  . Intimate partner violence:    Fear of current or ex partner: No    Emotionally abused: No    Physically abused: No    Forced sexual activity: No  Other Topics Concern  . Not on file  Social History Narrative   She is from [place].    Moved to [place] in 1998 because of husband's job transfer, that is when she stopped working -pending her green-card.   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    She had symptoms of MS in 2000 , daughter was 68 year old and just now found a job at [employer] March 2019   Separated since July 2019         Current Outpatient Medications:  .  albuterol (PROVENTIL HFA;VENTOLIN HFA) 108 (90 BASE) MCG/ACT inhaler, Inhale 1 puff into the lungs every 6 (six) hours as needed for wheezing or shortness of breath. Reported on 09/26/2015, Disp: , Rfl:  .  amphetamine-dextroamphetamine (ADDERALL XR) 20 MG 24 hr capsule, Take 1 capsule (20 mg total) by mouth every morning., Disp: 90 capsule, Rfl: 0 .  amphetamine-dextroamphetamine (ADDERALL) 10 MG tablet, Take 1 tablet (10 mg total) by mouth daily as needed. On weekends when you sleep in, Disp: 30 tablet, Rfl: 0 .  cetirizine (ZYRTEC ALLERGY) 10 MG tablet, Take 10 mg by mouth daily., Disp: , Rfl:  .  Cholecalciferol (VITAMIN D) 2000 units tablet, Take 2,000 Units by mouth daily., Disp: , Rfl:  .  desogestrel-ethinyl estradiol (APRI,EMOQUETTE,SOLIA) 0.15-30 MG-MCG tablet, Take by mouth., Disp: , Rfl:  .  DULoxetine (CYMBALTA) 30 MG capsule, Take 3 capsules (90 mg total) by mouth daily. [facility] psychiatrist, Disp: 270 capsule, Rfl: 0 .  EPINEPHrine 0.3 mg/0.3 mL IJ SOAJ injection, Inject 0.3 mg into the muscle once. Reported on 09/26/2015, Disp: , Rfl:  .  fluticasone (FLONASE) 50 MCG/ACT nasal spray, Place 2 sprays into both nostrils daily., Disp: 48 g, Rfl: 1 .  levocetirizine (XYZAL) 5 MG tablet, Take 1 tablet (5 mg total) by mouth every evening., Disp: 90 tablet, Rfl:  4 .  meloxicam (MOBIC) 7.5 MG tablet, Take 1 tablet (7.5 mg total) by mouth daily., Disp: 90 tablet, Rfl: 0 .  methocarbamol (ROBAXIN-750) 750 MG tablet, Robaxin-750  750 mg tablet  Take 1 tablet twice a day by oral route for 30 days., Disp: , Rfl:  .  montelukast (SINGULAIR) 10 MG tablet, Take 1 tablet (10 mg total) by mouth at bedtime., Disp: 90 tablet, Rfl: 1 .  phenazopyridine (PYRIDIUM) 95 MG tablet, Take 1 tablet (95 mg total) by mouth 2 (two) times daily as needed for pain., Disp: 6 tablet, Rfl: 1 .  polyethylene glycol powder (GLYCOLAX/MIRALAX) powder, Take  17 g by mouth 2 (two) times daily as needed., Disp: 3350 g, Rfl: 1 .  polyethylene glycol powder (GLYCOLAX/MIRALAX) powder, Take 17 g by mouth daily., Disp: 3350 g, Rfl: 0 .  QUEtiapine (SEROQUEL) 25 MG tablet, Take 1 tablet (25 mg total) by mouth at bedtime., Disp: 90 tablet, Rfl: 1 .  vitamin B-12 (CYANOCOBALAMIN) 1000 MCG tablet, Take 1,000 mcg by mouth daily., Disp: , Rfl:   Allergies  Allergen Reactions  . Penicillins Anaphylaxis  . Bee Venom     Bee    I personally reviewed active problem list, medication list, allergies, family history, social history with the patient/caregiver today.   ROS  Ten systems reviewed and is negative except as mentioned in HPI   Objective  Vitals:   08/10/18 1025  BP: 136/82  Pulse: (!) 103  Resp: 18  Temp: 98.1 F (36.7 C)  TempSrc: Oral  SpO2: 99%  Weight: 198 lb 12.8 oz (90.2 kg)  Height: 5\' 10"  (1.778 m)    Body mass index is 28.52 kg/m.  Physical Exam Constitutional: Patient appears well-developed and well-nourished. No distress.  HEENT: head atraumatic, normocephalic, pupils equal and reactive to light, neck supple, throat within normal limits Cardiovascular: Normal rate, regular rhythm and normal heart sounds.  No murmur heard. No BLE edema. Pulmonary/Chest: Effort normal and breath sounds normal. No respiratory distress. Abdominal: Soft.  There is no  tenderness. Muscular Skeletal: still has some swelling and pain during palpation of left 5th toe, reviewed x-ray patient is feeling better , we will hold off on x-ray but advised to return to Dr. Elvina Chan if no resolution in another month  Psychiatric: Patient has a normal mood and affect. behavior is normal. Judgment and thought content normal.  Recent Results (from the past 2160 hour(s))  Cervicovaginal ancillary only     Status: Abnormal   Collection Time: 07/07/18 12:00 AM  Result Value Ref Range   Bacterial vaginitis **POSITIVE for Gardnerella vaginalis** (A)     Comment: Normal Reference Range - Negative   Candida vaginitis Negative for Candida species     Comment: Normal Reference Range - Negative   Chlamydia Negative     Comment: Normal Reference Range - Negative   Neisseria gonorrhea Negative     Comment: Normal Reference Range - Negative   Trichomonas Negative     Comment: Normal Reference Range - Negative  POCT urinalysis dipstick     Status: Abnormal   Collection Time: 07/07/18 11:50 AM  Result Value Ref Range   Color, UA yellow    Clarity, UA clear    Glucose, UA Negative Negative   Bilirubin, UA negative    Ketones, UA negative    Spec Grav, UA 1.010 1.010 - 1.025   Blood, UA hemolyzed trace    pH, UA 8.0 5.0 - 8.0   Protein, UA Negative Negative   Urobilinogen, UA 0.2 0.2 or 1.0 E.U./dL   Nitrite, UA negative    Leukocytes, UA Small (1+) (A) Negative   Appearance clear    Odor strong   Urinalysis, Routine w reflex microscopic     Status: Abnormal   Collection Time: 07/07/18 12:08 PM  Result Value Ref Range   Color, Urine YELLOW YELLOW   APPearance CLEAR CLEAR   Specific Gravity, Urine 1.016 1.001 - 1.03   pH > OR = 8.5 (A) 5.0 - 8.0   Glucose, UA NEGATIVE NEGATIVE   Bilirubin Urine NEGATIVE NEGATIVE   Ketones, ur NEGATIVE NEGATIVE   Hgb  urine dipstick NEGATIVE NEGATIVE   Protein, ur NEGATIVE NEGATIVE   Nitrite NEGATIVE NEGATIVE   Leukocytes, UA NEGATIVE  NEGATIVE  Urine Culture     Status: Abnormal   Collection Time: 07/07/18 12:08 PM  Result Value Ref Range   MICRO NUMBER: [id number]    SPECIMEN QUALITY: Adequate    Sample Source URINE, CLEAN CATCH    STATUS: FINAL    ISOLATE 1: Escherichia coli (A)     Comment: 10,000-50,000 CFU/mL of Escherichia coli      Susceptibility   Escherichia coli - URINE CULTURE, REFLEX    AMOX/CLAVULANIC 4 Sensitive     AMPICILLIN >=32 Resistant     AMPICILLIN/SULBACTAM 4 Sensitive     CEFAZOLIN* <=4 Not Reportable      * For infections other than uncomplicated UTIcaused by E. coli, K. pneumoniae or P. mirabilis:Cefazolin is resistant if MIC > or = 8 mcg/mL.(Distinguishing susceptible versus intermediatefor isolates with MIC < or = 4 mcg/mL requiresadditional testing.)For uncomplicated UTI caused by E. coli,K. pneumoniae or P. mirabilis: Cefazolin issusceptible if MIC <32 mcg/mL and predictssusceptible to the oral agents cefaclor, cefdinir,cefpodoxime, cefprozil, cefuroxime, cephalexinand loracarbef.    CEFEPIME <=1 Sensitive     CEFTRIAXONE <=1 Sensitive     CIPROFLOXACIN <=0.25 Sensitive     LEVOFLOXACIN <=0.12 Sensitive     ERTAPENEM <=0.5 Sensitive     GENTAMICIN <=1 Sensitive     IMIPENEM <=0.25 Sensitive     NITROFURANTOIN <=16 Sensitive     PIP/TAZO <=4 Sensitive     TOBRAMYCIN <=1 Sensitive     TRIMETH/SULFA* >=320 Resistant      * For infections other than uncomplicated UTIcaused by E. coli, K. pneumoniae or P. mirabilis:Cefazolin is resistant if MIC > or = 8 mcg/mL.(Distinguishing susceptible versus intermediatefor isolates with MIC < or = 4 mcg/mL requiresadditional testing.)For uncomplicated UTI caused by E. coli,K. pneumoniae or P. mirabilis: Cefazolin issusceptible if MIC <32 mcg/mL and predictssusceptible to the oral agents cefaclor, cefdinir,cefpodoxime, cefprozil, cefuroxime, cephalexinand loracarbef.Legend:S = Susceptible  I = IntermediateR = Resistant  NS = Not susceptible* = Not tested  NR =  Not reported**NN = See antimicrobic comments      PHQ2/9: Depression screen [facility] 2/9 07/07/2018 05/08/2018 04/07/2018 01/04/2018 10/04/2017  Decreased Interest 0 0 0 2 1  Down, Depressed, Hopeless 0 0 0 3 1  PHQ - 2 Score 0 0 0 5 2  Altered sleeping 0 0 1 2 0  Tired, decreased energy 1 1 0 1 1  Change in appetite 0 0 1 2 2   Feeling bad or failure about yourself  0 0 0 0 0  Trouble concentrating 0 3 0 0 0  Moving slowly or fidgety/restless 0 0 0 1 0  Suicidal thoughts - 0 0 1 0  PHQ-9 Score 1 4 2 12 5   Difficult doing work/chores Not difficult at all Somewhat difficult Not difficult at all Somewhat difficult Not difficult at all  Some recent data might be hidden    Fall Risk: Fall Risk  07/11/2018 07/11/2018 07/07/2018 05/08/2018 04/07/2018  Falls in the past year? 0 1 1 Yes No  Number falls in past yr: - 1 1 2  or more -  Injury with Fall? - 1 1 No -  Comment - - - - -  Risk Factor Category  - - - - -  Comment - - - - -  Risk for fall due to : - History of fall(s) History of fall(s) - -  Follow up - - - - -  Assessment & Plan  1. Pain of toe of left foot  Doing better, we will hold off on referral to Dr. Elvina Chan, she is doing much better

## 2018-09-11 ENCOUNTER — Other Ambulatory Visit: Payer: Self-pay | Admitting: Family Medicine

## 2018-09-11 DIAGNOSIS — G8929 Other chronic pain: Secondary | ICD-10-CM

## 2018-09-11 DIAGNOSIS — M5442 Lumbago with sciatica, left side: Principal | ICD-10-CM

## 2018-10-11 ENCOUNTER — Encounter: Payer: Self-pay | Admitting: Family Medicine

## 2018-10-11 ENCOUNTER — Ambulatory Visit: Payer: BLUE CROSS/BLUE SHIELD | Admitting: Family Medicine

## 2018-10-11 ENCOUNTER — Other Ambulatory Visit: Payer: Self-pay

## 2018-10-11 VITALS — BP 110/70 | HR 95 | Temp 98.2°F | Resp 16 | Ht 70.0 in | Wt 198.5 lb

## 2018-10-11 DIAGNOSIS — E538 Deficiency of other specified B group vitamins: Secondary | ICD-10-CM

## 2018-10-11 DIAGNOSIS — Z23 Encounter for immunization: Secondary | ICD-10-CM

## 2018-10-11 DIAGNOSIS — G4709 Other insomnia: Secondary | ICD-10-CM

## 2018-10-11 DIAGNOSIS — F902 Attention-deficit hyperactivity disorder, combined type: Secondary | ICD-10-CM | POA: Diagnosis not present

## 2018-10-11 DIAGNOSIS — D352 Benign neoplasm of pituitary gland: Secondary | ICD-10-CM

## 2018-10-11 DIAGNOSIS — J4520 Mild intermittent asthma, uncomplicated: Secondary | ICD-10-CM | POA: Diagnosis not present

## 2018-10-11 DIAGNOSIS — F330 Major depressive disorder, recurrent, mild: Secondary | ICD-10-CM

## 2018-10-11 DIAGNOSIS — E559 Vitamin D deficiency, unspecified: Secondary | ICD-10-CM

## 2018-10-11 DIAGNOSIS — G35 Multiple sclerosis: Secondary | ICD-10-CM

## 2018-10-11 MED ORDER — AMPHETAMINE-DEXTROAMPHET ER 20 MG PO CP24: 20.0000 mg | ORAL_CAPSULE | ORAL | 0 refills | Status: DC

## 2018-10-11 MED ORDER — MONTELUKAST SODIUM 10 MG PO TABS: 10.0000 mg | ORAL_TABLET | Freq: Every day | ORAL | 1 refills | Status: DC

## 2018-10-11 MED ORDER — QUETIAPINE FUMARATE 25 MG PO TABS: 25.0000 mg | ORAL_TABLET | Freq: Every day | ORAL | 1 refills | Status: DC

## 2018-10-11 NOTE — Progress Notes (Signed)
Name: Robin Chan   MRN: [id number]    DOB: Oct 10, 1970   Date:10/11/2018       Progress Note  Subjective  Chief Complaint  Chief Complaint  Patient presents with  . Migraine  . Asthma  . ADD    HPI  ADD: taking medication daily as prescribed, on Adderal XR 20 mg now and she states she has been feeling well with medication, no side effects.She states she feels very sluggishand no energy or ability to focus without medication. She is working now, since March 2019 helps with her focus,she is living by herself in a house now , sometimes wakes up very late on weekends she cannot the XR, we gave her a rx for immediate release that she pays cash for, she still has enough for now.   Low back pain: she was seen here with left radiculitis on 07/11 took a round of prednisone taper and symptoms improved a little, but still having daily low back pain , no longer has radiculitis. She saw Dr. Catalina Antigua at [facility], had MRI lumbar spine and was diagnosed with DDD and was given meloxicam and methocarbamol however she stopped muscle relaxer because it was causing thigh pain and fatigue. She states upper back is bothersome now. She will re-schedule a follow up  Fractured left toe: happened two weeks ago, she uses a wagon to do her wagon, she hit the wheel of the wagon and developed pain and swelling, she is doing well now.   MS: diagnosed in 2000, but first symptoms in 1991 - she felt numb on the right side and weakness. Currently off medication because of multiple side effects. Has intermittent weakness and has used a cane periodically, for left foot drop - worse when she is tired and when it rains. Adderall helps with energy level. She still has left leg numbness intermittent - can last from hours to days. She started part time work at [employer] 03/19. She is trying to go to work full time, she states had two episodes of aphasia last week while at work but resolved by itself and is doing well now    Insomnia: she has been off Ambien, we stoppedit on theSpring 2018. She is taking Seroquel low dose qhs and helps her fall asleep ( takes about 10-20 minutes ) , she wakes up having night terrors at times, but otherwise she sleeps well.   Major Depression: she was taking Lexapro for many years and depression was not controlled, we switched to Cymbalta in June 2017, She is separated. She is also seeing psychiatrist at [facility] on high dose duloxetine 90 mg, denies side effects, Phq 9 only positive for insomnia, but chronic, she will re-schedule appointment for at least the therapist   Asthma: she is taking singulairdaily now , she denies wheezing, cough or SOB at this time   AR: rhinorrhea or nasal congestion controlled as long as she takes Xyzal, Zyrtec and flonase daily, she needs refill of singulair . Discussed new black box warning and she will continue medications  She states worse symptom at this time is watery and itchy eyes   Pituitary microadenoma: no significant change since 2011 -last one was 2019, diagnosed a mass possible Rathke's cleft cyst, last prolactin normal No double vision, or nipple discharge   Patient Active Problem List   Diagnosis Date Noted  . Pelvic pain 04/07/2017  . Medication management 04/07/2017  . Rectocele 07/08/2015  . Status post laparoscopic assisted vaginal hysterectomy (LAVH) 07/08/2015  .  History of hysterectomy for benign disease 05/26/2015  . Left lumbar radiculitis 03/28/2015  . Breast cancer screening 02/05/2015  . Abnormal CAT scan 12/31/2014  . ADD (attention deficit disorder) 12/31/2014  . Allergic to bees 12/31/2014  . Back muscle spasm 12/31/2014  . Insomnia, persistent 12/31/2014  . Constipation 12/31/2014  . Depression, major, recurrent, mild ([place]) 12/31/2014  . Personal history of traumatic fracture 12/31/2014  . Asthma, mild intermittent 12/31/2014  . Migraine with aura and without status migrainosus, not intractable  12/31/2014  . Multiple sclerosis, relapsing-remitting ([place]) 12/31/2014  . Endometriosis determined by laparoscopy 12/31/2014  . Perennial allergic rhinitis 12/31/2014  . Pituitary microadenoma ([place]) 12/31/2014  . Vitamin D deficiency 12/31/2014  . Acquired trigger finger 12/31/2014    Past Surgical History:  Procedure Laterality Date  . ABDOMINAL HYSTERECTOMY  05/26/2015  . BREAST BIOPSY Right 2005   benign  . LAPAROSCOPIC ASSISTED VAGINAL HYSTERECTOMY N/A 05/26/2015   Procedure: LAPAROSCOPIC ASSISTED VAGINAL HYSTERECTOMY, with right salpingectomy;  Surgeon: Brayton Mars, MD;  Location: [facility] ORS;  Service: Gynecology;  Laterality: N/A;  . OVARY SURGERY Left 09/30/2014  . RECTOCELE REPAIR N/A 05/26/2015   Procedure: POSTERIOR REPAIR (RECTOCELE);  Surgeon: Brayton Mars, MD;  Location: [facility] ORS;  Service: Gynecology;  Laterality: N/A;    Family History  Problem Relation Age of Onset  . Anemia Mother   . Osteoporosis Mother   . Mental illness Father        Bipolar  . Arthritis Brother   . Breast cancer Maternal Aunt     Social History   Socioeconomic History  . Marital status: Legally Separated    Spouse name: Not on file  . Number of children: 2  . Years of education: Not on file  . Highest education level: Associate degree: academic program  Occupational History  . Occupation: Proofreader     Comment: [employer]   Social Needs  . Financial resource strain: Not hard at all  . Food insecurity:    Worry: Never true    Inability: Never true  . Transportation needs:    Medical: No    Non-medical: No  Tobacco Use  . Smoking status: Never Smoker  . Smokeless tobacco: Never Used  Substance and Sexual Activity  . Alcohol use: Yes    Alcohol/week: 14.0 standard drinks    Types: 14 Shots of liquor per week    Comment: occasionally, 1-2 shots every night  . Drug use: No  . Sexual activity: Yes    Partners: Male    Birth control/protection: None     Comment: separated   Lifestyle  . Physical activity:    Days per week: 0 days    Minutes per session: 0 min  . Stress: Rather much  Relationships  . Social connections:    Talks on phone: More than three times a week    Gets together: More than three times a week    Attends religious service: Never    Active member of club or organization: No    Attends meetings of clubs or organizations: Never    Relationship status: Married  . Intimate partner violence:    Fear of current or ex partner: No    Emotionally abused: No    Physically abused: No    Forced sexual activity: No  Other Topics Concern  . Not on file  Social History Narrative   She is from [place].    Moved to [place] in 1998 because of  husband's job transfer, that is when she stopped working -pending her green-card.   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    She had symptoms of MS in 2000 , daughter was 52 year old and just now found a job at [employer] March 2019   Separated since July 2019         Current Outpatient Medications:  .  albuterol (PROVENTIL HFA;VENTOLIN HFA) 108 (90 BASE) MCG/ACT inhaler, Inhale 1 puff into the lungs every 6 (six) hours as needed for wheezing or shortness of breath. Reported on 09/26/2015, Disp: , Rfl:  .  amphetamine-dextroamphetamine (ADDERALL XR) 20 MG 24 hr capsule, Take 1 capsule (20 mg total) by mouth every morning., Disp: 90 capsule, Rfl: 0 .  amphetamine-dextroamphetamine (ADDERALL) 10 MG tablet, Take 1 tablet (10 mg total) by mouth daily as needed. On weekends when you sleep in, Disp: 30 tablet, Rfl: 0 .  cetirizine (ZYRTEC ALLERGY) 10 MG tablet, Take 10 mg by mouth daily., Disp: , Rfl:  .  DULoxetine (CYMBALTA) 30 MG capsule, Take 3 capsules (90 mg total) by mouth daily. [facility] psychiatrist, Disp: 270 capsule, Rfl: 0 .  EPINEPHrine 0.3 mg/0.3 mL IJ SOAJ injection, Inject 0.3 mg into the muscle once. Reported on 09/26/2015, Disp: , Rfl:  .  fluticasone (FLONASE) 50 MCG/ACT nasal spray, Place  2 sprays into both nostrils daily., Disp: 48 g, Rfl: 1 .  levocetirizine (XYZAL) 5 MG tablet, Take 1 tablet (5 mg total) by mouth every evening., Disp: 90 tablet, Rfl: 4 .  meloxicam (MOBIC) 7.5 MG tablet, TAKE 1 TABLET BY MOUTH  DAILY, Disp: 90 tablet, Rfl: 0 .  montelukast (SINGULAIR) 10 MG tablet, Take 1 tablet (10 mg total) by mouth at bedtime., Disp: 90 tablet, Rfl: 1 .  polyethylene glycol powder (GLYCOLAX/MIRALAX) powder, Take 17 g by mouth 2 (two) times daily as needed., Disp: 3350 g, Rfl: 1 .  QUEtiapine (SEROQUEL) 25 MG tablet, Take 1 tablet (25 mg total) by mouth at bedtime., Disp: 90 tablet, Rfl: 1 .  vitamin B-12 (CYANOCOBALAMIN) 1000 MCG tablet, Take 1,000 mcg by mouth daily., Disp: , Rfl:  .  Cholecalciferol (VITAMIN D) 2000 units tablet, Take 2,000 Units by mouth daily., Disp: , Rfl:   Allergies  Allergen Reactions  . Penicillins Anaphylaxis  . Bee Venom     Bee    I personally reviewed active problem list, medication list, allergies, family history, social history with the patient/caregiver today.   ROS  Constitutional: Negative for fever or weight change.  Respiratory: Negative for cough and shortness of breath.   Cardiovascular: Negative for chest pain or palpitations.  Gastrointestinal: Negative for abdominal pain, no bowel changes.  Musculoskeletal: positive  for gait problem but no joint swelling.  Skin: Negative for rash.  Neurological: Negative for dizziness or headache.  No other specific complaints in a complete review of systems (except as listed in HPI above).  Objective  Vitals:   10/11/18 0859  BP: 110/70  Pulse: 95  Resp: 16  Temp: 98.2 F (36.8 C)  TempSrc: Oral  SpO2: 98%  Weight: 198 lb 8 oz (90 kg)  Height: 5\' 10"  (1.778 m)    Body mass index is 28.48 kg/m.  Physical Exam  Constitutional: Patient appears well-developed and well-nourished. Overweight.  No distress.  HEENT: head atraumatic, normocephalic, pupils equal and reactive to  light,  neck supple, throat within normal limits Cardiovascular: Normal rate, regular rhythm and normal heart sounds.  No murmur heard. No  BLE edema. Pulmonary/Chest: Effort normal and breath sounds normal. No respiratory distress. Abdominal: Soft.  There is no tenderness. Psychiatric: Patient has a normal mood and affect. behavior is normal. Judgment and thought content normal. Neurological: normal gait today, cranial nerves negative   PHQ2/9: Depression screen [facility] 2/9 10/11/2018 07/07/2018 05/08/2018 04/07/2018 01/04/2018  Decreased Interest 0 0 0 0 2  Down, Depressed, Hopeless 0 0 0 0 3  PHQ - 2 Score 0 0 0 0 5  Altered sleeping 2 0 0 1 2  Tired, decreased energy 1 1 1  0 1  Change in appetite 0 0 0 1 2  Feeling bad or failure about yourself  0 0 0 0 0  Trouble concentrating 0 0 3 0 0  Moving slowly or fidgety/restless 0 0 0 0 1  Suicidal thoughts 0 - 0 0 1  PHQ-9 Score 3 1 4 2 12   Difficult doing work/chores Not difficult at all Not difficult at all Somewhat difficult Not difficult at all Somewhat difficult  Some recent data might be hidden   phq9 is good, she has Depression but in remission   Fall Risk: Fall Risk  10/11/2018 07/11/2018 07/11/2018 07/07/2018 05/08/2018  Falls in the past year? 0 0 1 1 Yes  Number falls in past yr: 0 - 1 1 2  or more  Injury with Fall? 0 - 1 1 No  Comment - - - - -  Risk Factor Category  - - - - -  Comment - - - - -  Risk for fall due to : - - History of fall(s) History of fall(s) -  Follow up - - - - -     Assessment & Plan  1. Multiple sclerosis, relapsing-remitting (HCC)  Doing well at this time - amphetamine-dextroamphetamine (ADDERALL XR) 20 MG 24 hr capsule; Take 1 capsule (20 mg total) by mouth every morning.  Dispense: 90 capsule; Refill: 0  2. Need for Tdap vaccination  - Tdap vaccine greater than or equal to 7yo IM  3. Attention deficit hyperactivity disorder (ADHD), combined type  - amphetamine-dextroamphetamine (ADDERALL  XR) 20 MG 24 hr capsule; Take 1 capsule (20 mg total) by mouth every morning.  Dispense: 90 capsule; Refill: 0  4. Mild intermittent asthma without complication  - montelukast (SINGULAIR) 10 MG tablet; Take 1 tablet (10 mg total) by mouth at bedtime.  Dispense: 90 tablet; Refill: 1  5. Depression, major, recurrent, mild (HCC)  - amphetamine-dextroamphetamine (ADDERALL XR) 20 MG 24 hr capsule; Take 1 capsule (20 mg total) by mouth every morning.  Dispense: 90 capsule; Refill: 0  6. B12 deficiency  Continue supplementation   7. Vitamin D deficiency  Continue supplementation   8. Pituitary microadenoma ([place])  No double vision or nipple discharge   9. Other insomnia  - QUEtiapine (SEROQUEL) 25 MG tablet; Take 1 tablet (25 mg total) by mouth at bedtime.  Dispense: 90 tablet; Refill: 1

## 2018-10-11 NOTE — Patient Instructions (Signed)
Ask insurance about Pneumococcal vaccine - PCV 13

## 2018-11-08 DIAGNOSIS — G894 Chronic pain syndrome: Secondary | ICD-10-CM | POA: Diagnosis not present

## 2018-11-08 DIAGNOSIS — M545 Low back pain: Secondary | ICD-10-CM | POA: Diagnosis not present

## 2018-11-27 ENCOUNTER — Encounter: Payer: Self-pay | Admitting: Family Medicine

## 2018-12-07 ENCOUNTER — Other Ambulatory Visit: Payer: Self-pay | Admitting: Family Medicine

## 2018-12-11 ENCOUNTER — Inpatient Hospital Stay: Payer: BLUE CROSS/BLUE SHIELD

## 2018-12-11 ENCOUNTER — Other Ambulatory Visit: Payer: Self-pay

## 2018-12-11 ENCOUNTER — Emergency Department: Payer: BLUE CROSS/BLUE SHIELD

## 2018-12-11 ENCOUNTER — Encounter: Payer: Self-pay | Admitting: Radiology

## 2018-12-11 ENCOUNTER — Inpatient Hospital Stay
Admission: EM | Admit: 2018-12-11 | Discharge: 2018-12-17 | DRG: 336 | Disposition: A | Discharge status: Home / Self Care | Payer: BLUE CROSS/BLUE SHIELD | Attending: Surgery | Admitting: Surgery

## 2018-12-11 DIAGNOSIS — R1084 Generalized abdominal pain: Secondary | ICD-10-CM | POA: Diagnosis not present

## 2018-12-11 DIAGNOSIS — K219 Gastro-esophageal reflux disease without esophagitis: Secondary | ICD-10-CM | POA: Diagnosis present

## 2018-12-11 DIAGNOSIS — K56609 Unspecified intestinal obstruction, unspecified as to partial versus complete obstruction: Secondary | ICD-10-CM

## 2018-12-11 DIAGNOSIS — F909 Attention-deficit hyperactivity disorder, unspecified type: Secondary | ICD-10-CM | POA: Diagnosis present

## 2018-12-11 DIAGNOSIS — K6389 Other specified diseases of intestine: Secondary | ICD-10-CM | POA: Diagnosis not present

## 2018-12-11 DIAGNOSIS — R188 Other ascites: Secondary | ICD-10-CM | POA: Diagnosis not present

## 2018-12-11 DIAGNOSIS — K56600 Partial intestinal obstruction, unspecified as to cause: Secondary | ICD-10-CM | POA: Diagnosis present

## 2018-12-11 DIAGNOSIS — F418 Other specified anxiety disorders: Secondary | ICD-10-CM | POA: Diagnosis present

## 2018-12-11 DIAGNOSIS — E876 Hypokalemia: Secondary | ICD-10-CM | POA: Diagnosis present

## 2018-12-11 DIAGNOSIS — Z6828 Body mass index (BMI) 28.0-28.9, adult: Secondary | ICD-10-CM

## 2018-12-11 DIAGNOSIS — G35 Multiple sclerosis: Secondary | ICD-10-CM | POA: Diagnosis present

## 2018-12-11 DIAGNOSIS — R102 Pelvic and perineal pain: Secondary | ICD-10-CM | POA: Diagnosis not present

## 2018-12-11 DIAGNOSIS — E669 Obesity, unspecified: Secondary | ICD-10-CM | POA: Diagnosis present

## 2018-12-11 DIAGNOSIS — R52 Pain, unspecified: Secondary | ICD-10-CM | POA: Diagnosis not present

## 2018-12-11 DIAGNOSIS — F419 Anxiety disorder, unspecified: Secondary | ICD-10-CM | POA: Diagnosis present

## 2018-12-11 DIAGNOSIS — K5651 Intestinal adhesions [bands], with partial obstruction: Secondary | ICD-10-CM | POA: Diagnosis not present

## 2018-12-11 DIAGNOSIS — Z1159 Encounter for screening for other viral diseases: Secondary | ICD-10-CM | POA: Diagnosis not present

## 2018-12-11 DIAGNOSIS — Z4682 Encounter for fitting and adjustment of non-vascular catheter: Secondary | ICD-10-CM | POA: Diagnosis not present

## 2018-12-11 DIAGNOSIS — Z20828 Contact with and (suspected) exposure to other viral communicable diseases: Secondary | ICD-10-CM | POA: Diagnosis not present

## 2018-12-11 DIAGNOSIS — Z0189 Encounter for other specified special examinations: Secondary | ICD-10-CM

## 2018-12-11 LAB — CBC
HCT: 40 % (ref 36.0–46.0)
Hemoglobin: 13.4 g/dL (ref 12.0–15.0)
MCH: 31.1 pg (ref 26.0–34.0)
MCHC: 33.5 g/dL (ref 30.0–36.0)
MCV: 92.8 fL (ref 80.0–100.0)
Platelets: 409 10*3/uL — ABNORMAL HIGH (ref 150–400)
RBC: 4.31 MIL/uL (ref 3.87–5.11)
RDW: 11.9 % (ref 11.5–15.5)
WBC: 11.5 10*3/uL — ABNORMAL HIGH (ref 4.0–10.5)
nRBC: 0 % (ref 0.0–0.2)

## 2018-12-11 LAB — COMPREHENSIVE METABOLIC PANEL
ALT: 16 U/L (ref 0–44)
AST: 16 U/L (ref 15–41)
Albumin: 4.4 g/dL (ref 3.5–5.0)
Alkaline Phosphatase: 78 U/L (ref 38–126)
Anion gap: 11 (ref 5–15)
BUN: 15 mg/dL (ref 6–20)
CO2: 25 mmol/L (ref 22–32)
Calcium: 9.1 mg/dL (ref 8.9–10.3)
Chloride: 102 mmol/L (ref 98–111)
Creatinine, Ser: 0.93 mg/dL (ref 0.44–1.00)
GFR calc Af Amer: >60 mL/min (ref >60)
GFR calc non Af Amer: >60 mL/min (ref >60)
Glucose, Bld: 137 mg/dL — ABNORMAL HIGH (ref 70–99)
Potassium: 3.8 mmol/L (ref 3.5–5.1)
Sodium: 138 mmol/L (ref 135–145)
Total Bilirubin: 1 mg/dL (ref 0.3–1.2)
Total Protein: 7.7 g/dL (ref 6.5–8.1)

## 2018-12-11 LAB — URINALYSIS, COMPLETE (UACMP) WITH MICROSCOPIC
Bilirubin Urine: NEGATIVE
Glucose, UA: NEGATIVE mg/dL
Hgb urine dipstick: NEGATIVE
Ketones, ur: NEGATIVE mg/dL
Leukocytes,Ua: NEGATIVE
Nitrite: NEGATIVE
Protein, ur: 30 mg/dL — AB
Specific Gravity, Urine: 1.027 (ref 1.005–1.030)
pH: 6 (ref 5.0–8.0)

## 2018-12-11 LAB — PREGNANCY, URINE: Preg Test, Ur: NEGATIVE

## 2018-12-11 LAB — LIPASE, BLOOD: Lipase: 22 U/L (ref 11–51)

## 2018-12-11 LAB — SARS CORONAVIRUS 2 BY RT PCR (HOSPITAL ORDER, PERFORMED IN ~~LOC~~ HOSPITAL LAB): SARS Coronavirus 2: NEGATIVE

## 2018-12-11 MED ORDER — ENOXAPARIN SODIUM 40 MG/0.4ML ~~LOC~~ SOLN
40.0000 mg | SUBCUTANEOUS | Status: DC
  Administered 2018-12-11 – 2018-12-16 (×6): 40 mg via SUBCUTANEOUS
  Filled 2018-12-11 (×6): qty 0.4

## 2018-12-11 MED ORDER — ONDANSETRON HCL 4 MG/2ML IJ SOLN
4.0000 mg | Freq: Four times a day (QID) | INTRAMUSCULAR | Status: DC | PRN
  Administered 2018-12-11 – 2018-12-16 (×6): 4 mg via INTRAVENOUS
  Filled 2018-12-11 (×4): qty 2

## 2018-12-11 MED ORDER — HYDROMORPHONE HCL 1 MG/ML IJ SOLN
1.0000 mg | INTRAMUSCULAR | Status: AC
  Administered 2018-12-11: 08:00:00 1 mg via INTRAVENOUS
  Filled 2018-12-11: qty 1

## 2018-12-11 MED ORDER — FENTANYL CITRATE (PF) 100 MCG/2ML IJ SOLN
50.0000 ug | INTRAMUSCULAR | Status: DC | PRN
  Administered 2018-12-11: 05:00:00 50 ug via NASAL
  Filled 2018-12-11: qty 2

## 2018-12-11 MED ORDER — MORPHINE SULFATE (PF) 2 MG/ML IV SOLN
2.0000 mg | INTRAVENOUS | Status: DC | PRN
  Administered 2018-12-11: 22:00:00 4 mg via INTRAVENOUS
  Administered 2018-12-11 (×2): 2 mg via INTRAVENOUS
  Administered 2018-12-12 – 2018-12-13 (×2): 4 mg via INTRAVENOUS
  Filled 2018-12-11 (×2): qty 2
  Filled 2018-12-11 (×2): qty 1
  Filled 2018-12-11 (×2): qty 2

## 2018-12-11 MED ORDER — KETOROLAC TROMETHAMINE 30 MG/ML IJ SOLN: 30.0000 mg | Freq: Four times a day (QID) | INTRAMUSCULAR | Status: DC | PRN

## 2018-12-11 MED ORDER — HYDROMORPHONE HCL 1 MG/ML IJ SOLN
1.0000 mg | INTRAMUSCULAR | Status: AC
  Administered 2018-12-11: 07:00:00 1 mg via INTRAVENOUS
  Filled 2018-12-11: qty 1

## 2018-12-11 MED ORDER — MORPHINE SULFATE (PF) 4 MG/ML IV SOLN
4.0000 mg | Freq: Once | INTRAVENOUS | Status: AC
  Administered 2018-12-11: 07:00:00 4 mg via INTRAVENOUS
  Filled 2018-12-11: qty 1

## 2018-12-11 MED ORDER — HYDRALAZINE HCL 20 MG/ML IJ SOLN: 10.0000 mg | INTRAMUSCULAR | Status: DC | PRN

## 2018-12-11 MED ORDER — DEXTROSE IN LACTATED RINGERS 5 % IV SOLN
INTRAVENOUS | Status: DC
  Administered 2018-12-11 – 2018-12-14 (×8): via INTRAVENOUS

## 2018-12-11 MED ORDER — ONDANSETRON 4 MG PO TBDP
4.0000 mg | ORAL_TABLET | Freq: Once | ORAL | Status: AC | PRN
  Administered 2018-12-11: 4 mg via ORAL
  Filled 2018-12-11: qty 1

## 2018-12-11 MED ORDER — KETOROLAC TROMETHAMINE 30 MG/ML IJ SOLN
30.0000 mg | Freq: Four times a day (QID) | INTRAMUSCULAR | Status: AC
  Administered 2018-12-11 – 2018-12-16 (×19): 30 mg via INTRAVENOUS
  Filled 2018-12-11 (×20): qty 1

## 2018-12-11 MED ORDER — ONDANSETRON 4 MG PO TBDP: 4.0000 mg | ORAL_TABLET | Freq: Four times a day (QID) | ORAL | Status: DC | PRN

## 2018-12-11 MED ORDER — IOHEXOL 300 MG/ML  SOLN: 100.0000 mL | Freq: Once | INTRAMUSCULAR | Status: DC | PRN

## 2018-12-11 NOTE — ED Provider Notes (Signed)
Patient waiting for available treatment bed.  She is writhing in pain in the subway area.  She reports fentanyl did not help her pain, reports it is 10 of 10 severe crampy in the lower abdomen.  She denies any chance of pregnancy reports she is had a previous hysterectomy.  As she is waiting, she is agreeable to stay and awaits a treatment bed and I will provide her a dose of morphine as she is fully alert in obvious pain rocking back and forth on the bed.   Delman Kitten, MD 12/11/18 [phone]

## 2018-12-11 NOTE — ED Notes (Signed)
Pt found with her prone on all fours in bed when this RN came into room stating "please put a foley in cause I can't pee". Pt was bladder scanned and 162 ml found in bladder.

## 2018-12-11 NOTE — ED Provider Notes (Signed)
[facility] Emergency Department Provider Note       Time seen: ----------------------------------------- 7:19 AM on 12/11/2018 -----------------------------------------   I have reviewed the triage vital signs and the nursing notes.  HISTORY   Chief Complaint Abdominal Pain    HPI Robin Chan is a 48 y.o. female with a history of allergies, anxiety, chronic pelvic pain, complex ovarian cyst, endometriosis, GERD, multiple sclerosis who presents to the ED for generalized abdominal pain since 2100 hrs. last night.  Patient has been screaming in pain since arrival.  Pain seems to radiate worse to the left upper quadrant.  She denies diarrhea but has had vomiting  Past Medical History:  Diagnosis Date  . ADHD (attention deficit hyperactivity disorder)   . Allergy   . Anxiety   . Chronic pelvic pain in female   . Complex ovarian cyst   . Depression   . Dyspareunia   . Endometriosis   . Fibroid   . GERD (gastroesophageal reflux disease)   . Heavy period   . MS (multiple sclerosis) ([place])   . Painful menstruation   . Rectocele     Patient Active Problem List   Diagnosis Date Noted  . Pelvic pain 04/07/2017  . Medication management 04/07/2017  . Rectocele 07/08/2015  . Status post laparoscopic assisted vaginal hysterectomy (LAVH) 07/08/2015  . History of hysterectomy for benign disease 05/26/2015  . Left lumbar radiculitis 03/28/2015  . Breast cancer screening 02/05/2015  . Abnormal CAT scan 12/31/2014  . ADD (attention deficit disorder) 12/31/2014  . Allergic to bees 12/31/2014  . Back muscle spasm 12/31/2014  . Insomnia, persistent 12/31/2014  . Constipation 12/31/2014  . Depression, major, recurrent, mild ([place]) 12/31/2014  . Personal history of traumatic fracture 12/31/2014  . Asthma, mild intermittent 12/31/2014  . Migraine with aura and without status migrainosus, not intractable 12/31/2014  . Multiple sclerosis, relapsing-remitting  ([place]) 12/31/2014  . Endometriosis determined by laparoscopy 12/31/2014  . Perennial allergic rhinitis 12/31/2014  . Pituitary microadenoma ([place]) 12/31/2014  . Vitamin D deficiency 12/31/2014  . Acquired trigger finger 12/31/2014    Past Surgical History:  Procedure Laterality Date  . ABDOMINAL HYSTERECTOMY  05/26/2015  . BREAST BIOPSY Right 2005   benign  . LAPAROSCOPIC ASSISTED VAGINAL HYSTERECTOMY N/A 05/26/2015   Procedure: LAPAROSCOPIC ASSISTED VAGINAL HYSTERECTOMY, with right salpingectomy;  Surgeon: Brayton Mars, MD;  Location: [facility] ORS;  Service: Gynecology;  Laterality: N/A;  . OVARY SURGERY Left 09/30/2014  . RECTOCELE REPAIR N/A 05/26/2015   Procedure: POSTERIOR REPAIR (RECTOCELE);  Surgeon: Brayton Mars, MD;  Location: [facility] ORS;  Service: Gynecology;  Laterality: N/A;    Allergies Penicillins and Bee venom  Social History Social History   Tobacco Use  . Smoking status: Never Smoker  . Smokeless tobacco: Never Used  Substance Use Topics  . Alcohol use: Yes    Alcohol/week: 14.0 standard drinks    Types: 14 Shots of liquor per week    Comment: occasionally, 1-2 shots every night  . Drug use: No    Review of Systems Constitutional: Negative for fever. Cardiovascular: Negative for chest pain. Respiratory: Negative for shortness of breath. Gastrointestinal: Positive for abdominal pain, vomiting Musculoskeletal: Negative for back pain. Skin: Negative for rash. Neurological: Negative for headaches, focal weakness or numbness.  All systems negative/normal/unremarkable except as stated in the HPI  ____________________________________________   PHYSICAL EXAM:  VITAL SIGNS: ED Triage Vitals  Enc Vitals Group     BP 12/11/18 0245 104/87  Pulse Rate 12/11/18 0245 94     Resp 12/11/18 0245 16     Temp 12/11/18 0245 98 F (36.7 C)     Temp Source 12/11/18 0245 Oral     SpO2 12/11/18 0245 100 %     Weight 12/11/18 0243 190 lb (86.2 kg)      Height 12/11/18 0243 5\' 9"  (1.753 m)     Head Circumference --      Peak Flow --      Pain Score 12/11/18 0243 10     Pain Loc --      Pain Edu? --      Excl. in [place]? --    Constitutional: Alert and oriented.  Anxious and histrionic, moderate distress Eyes: Conjunctivae are normal. Normal extraocular movements. Cardiovascular: Normal rate, regular rhythm. No murmurs, rubs, or gallops. Respiratory: Normal respiratory effort without tachypnea nor retractions. Breath sounds are clear and equal bilaterally. No wheezes/rales/rhonchi. Gastrointestinal: Soft, non-focal but diffuse tenderness, normal bowel sounds Musculoskeletal: Nontender with normal range of motion in extremities. No lower extremity tenderness nor edema. Neurologic:  Normal speech and language. No gross focal neurologic deficits are appreciated.  Skin:  Skin is warm, dry and intact. No rash noted. Psychiatric: Histrionic behavior ____________________________________________  ED COURSE:  As part of my medical decision making, I reviewed the following data within the [place] History obtained from family if available, nursing notes, old chart and ekg, as well as notes from prior ED visits. Patient presented for abdominal pain, we will assess with labs and imaging as indicated at this time.   Procedures  VARIE MACHAMER was evaluated in Emergency Department on 12/11/2018 for the symptoms described in the history of present illness. She was evaluated in the context of the global COVID-19 pandemic, which necessitated consideration that the patient might be at risk for infection with the SARS-CoV-2 virus that causes COVID-19. Institutional protocols and algorithms that pertain to the evaluation of patients at risk for COVID-19 are in a state of rapid change based on information released by regulatory bodies including the CDC and federal and state organizations. These policies and algorithms were followed during the  patient's care in the ED.  ____________________________________________   LABS (pertinent positives/negatives)  Labs Reviewed  COMPREHENSIVE METABOLIC PANEL - Abnormal; Notable for the following components:      Result Value   Glucose, Bld 137 (*)    All other components within normal limits  CBC - Abnormal; Notable for the following components:   WBC 11.5 (*)    Platelets 409 (*)    All other components within normal limits  URINALYSIS, COMPLETE (UACMP) WITH MICROSCOPIC - Abnormal; Notable for the following components:   Color, Urine YELLOW (*)    APPearance CLOUDY (*)    Protein, ur 30 (*)    Bacteria, UA FEW (*)    All other components within normal limits  LIPASE, BLOOD  PREGNANCY, URINE    RADIOLOGY Images were viewed by me  CT the abdomen pelvis with contrast IMPRESSION: There are distended proximal small bowel loops and decompressed distal small bowel loops compatible with partial or early small bowel obstruction pattern. The exact transition point is difficult to visualize and obstruction may be due to adhesions.  Multiple small hypodensities are scattered throughout the liver. These are nonspecific. If there is a history of malignancy or risk of malignancy, six-month follow-up MRI is recommended. Pelvic US IMPRESSION: Status post hysterectomy.  The ovaries are not well visualized.  Note is made of mildly prominent fluid-filled bowel within the pelvis. ____________________________________________   DIFFERENTIAL DIAGNOSIS   Chronic pain, anxiety, ovarian cyst, ovarian torsion, appendicitis, bowel obstruction  FINAL ASSESSMENT AND PLAN  Abdominal pain, possible small bowel obstruction   Plan: The patient had presented for severe diffuse abdominal pain. Patient's labs were grossly unremarkable, minimal leukocytosis is noted. Patient's imaging regarding her ultrasound was unremarkable except for fluid-filled bowel within the pelvis.  CT of the abdomen and  pelvis is concerning for a small bowel obstruction.  I will discuss with general surgery for further evaluation.   Laurence Aly, MD    Note: This note was generated in part or whole with voice recognition software. Voice recognition is usually quite accurate but there are transcription errors that can and very often do occur. I apologize for any typographical errors that were not detected and corrected.     Earleen Newport, MD 12/11/18 [phone]

## 2018-12-11 NOTE — ED Notes (Signed)
Patient resting quietly with eyes closed in no acute distress.  

## 2018-12-11 NOTE — ED Notes (Signed)
Pt informed she will receive ultrasound. Pt states fentanyl made her pain worse. md aware. Pt is quiet and still until staff is heard or visualized, then pt begins to yell loudly.

## 2018-12-11 NOTE — ED Notes (Signed)
Pt to subwait, call bell provided.

## 2018-12-11 NOTE — ED Notes (Signed)
Pt requesting water. Pt informed she is to remain NPO at this time. Pt is quiet unless staff walks by, then she begins to yell, moan and ask for water.

## 2018-12-11 NOTE — ED Notes (Signed)
Pt returned from CT moaning and crying out in pain. Pt alert, able to reposition herself and answer questions. Pt advised that we should have test results soon and make a plan from there.

## 2018-12-11 NOTE — ED Notes (Signed)
Pt provided with ice pack for "pain" and "overheating". Pt sitting on floor. Pt encouraged to sit in chair. Pt states she will not sit in chair she wants to sit on the floor. Blanket provided per request.

## 2018-12-11 NOTE — ED Notes (Signed)
Pt yelling for this RN while Rn with another pt. Pt was previously quiet sitting in chair in lobby until this RN near, then she sat on floor and began to yell. Pt states "i'm getting so much worse". Pt informed this rn can administer fentanyl nasally for pain but pt must wait in subwait.

## 2018-12-11 NOTE — ED Notes (Signed)
ED TO INPATIENT HANDOFF REPORT  ED Nurse Name and Phone #: Salayah Meares [phone]  S Name/Age/Gender Robin Chan 48 y.o. female Room/Bed: ED19A/ED19A  Code Status   Code Status: Full Code  Home/SNF/Other Home Patient oriented to: self, place, time and situation Is this baseline? Yes   Triage Complete: Triage complete  Chief Complaint abdominal pain   Triage Note Pt states lower abd pain with vomiting since 2100. Pt denies diarrhea. Pt is lying on floor, and refuses to get up. Skin normal color warm and dry. Pt states pain radiates up to luq.    Allergies Allergies  Allergen Reactions  . Penicillins Anaphylaxis    Did it involve swelling of the face/tongue/throat, SOB, or low BP? Yes Did it involve sudden or severe rash/hives, skin peeling, or any reaction on the inside of your mouth or nose? No Did you need to seek medical attention at a hospital or doctor's office? Yes When did it last happen?A long time ago If all above answers are "NO", may proceed with cephalosporin use.  Raelyn Ensign Venom     Bee    Level of Care/Admitting Diagnosis ED Disposition    ED Disposition Condition [facility] Area: [place] [[id number]]  Level of Care: Med-Surg [16]  Covid Evaluation: Screening Protocol (No Symptoms)  Diagnosis: Partial small bowel obstruction [facility]) [[id number]]  Admitting Physician: Vickie Epley [[id number]]  Attending Physician: Vickie Epley [[id number]]  Estimated length of stay: past midnight tomorrow  Certification:: I certify this patient will need inpatient services for at least 2 midnights  PT Class (Do Not Modify): Inpatient [101]  PT Acc Code (Do Not Modify): Private [1]       B Medical/Surgery History Past Medical History:  Diagnosis Date  . ADHD (attention deficit hyperactivity disorder)   . Allergy   . Anxiety   . Chronic pelvic pain in female   . Complex ovarian cyst   . Depression   . Dyspareunia   . Endometriosis    . Fibroid   . GERD (gastroesophageal reflux disease)   . Heavy period   . MS (multiple sclerosis) ([place])   . Painful menstruation   . Rectocele    Past Surgical History:  Procedure Laterality Date  . ABDOMINAL HYSTERECTOMY  05/26/2015  . BREAST BIOPSY Right 2005   benign  . LAPAROSCOPIC ASSISTED VAGINAL HYSTERECTOMY N/A 05/26/2015   Procedure: LAPAROSCOPIC ASSISTED VAGINAL HYSTERECTOMY, with right salpingectomy;  Surgeon: Brayton Mars, MD;  Location: [facility] ORS;  Service: Gynecology;  Laterality: N/A;  . OVARY SURGERY Left 09/30/2014  . RECTOCELE REPAIR N/A 05/26/2015   Procedure: POSTERIOR REPAIR (RECTOCELE);  Surgeon: Brayton Mars, MD;  Location: [facility] ORS;  Service: Gynecology;  Laterality: N/A;     A IV Location/Drains/Wounds Patient Lines/Drains/Airways Status   Active Line/Drains/Airways    Name:   Placement date:   Placement time:   Site:   Days:   Peripheral IV 12/11/18 Right Antecubital   12/11/18    0657    Antecubital   less than 1   NG/OG Tube Orogastric 18 Fr. [place] mouth   05/26/15    [place] mouth   1295   Incision (Closed) 05/26/15 Abdomen Right   05/26/15    1010     1295   Incision (Closed) 05/26/15 Vagina   05/26/15    1013     1295   Incision (Closed) 05/26/15 Abdomen Left   05/26/15  1300     1295   Incision (Closed) 05/26/15 Abdomen Mid   05/26/15    1300     1295          Intake/Output Last 24 hours No intake or output data in the 24 hours ending 12/11/18 0914  Labs/Imaging Results for orders placed or performed during the hospital encounter of 12/11/18 (from the past 48 hour(s))  Lipase, blood     Status: None   Collection Time: 12/11/18  3:03 AM  Result Value Ref Range   Lipase 22 11 - 51 U/L    Comment: Performed at [facility], [address] [place], [place] [address]  Comprehensive metabolic panel     Status: Abnormal   Collection Time: 12/11/18  3:03 AM  Result Value Ref Range   Sodium 138 135 - 145  mmol/L   Potassium 3.8 3.5 - 5.1 mmol/L   Chloride 102 98 - 111 mmol/L   CO2 25 22 - 32 mmol/L   Glucose, Bld 137 (H) 70 - 99 mg/dL   BUN 15 6 - 20 mg/dL   Creatinine, Ser 0.93 0.44 - 1.00 mg/dL   Calcium 9.1 8.9 - 10.3 mg/dL   Total Protein 7.7 6.5 - 8.1 g/dL   Albumin 4.4 3.5 - 5.0 g/dL   AST 16 15 - 41 U/L   ALT 16 0 - 44 U/L   Alkaline Phosphatase 78 38 - 126 U/L   Total Bilirubin 1.0 0.3 - 1.2 mg/dL   GFR calc non Af Amer >60 >60 mL/min   GFR calc Af Amer >60 >60 mL/min   Anion gap 11 5 - 15    Comment: Performed at [facility], [address] [place], [place] [address]  CBC     Status: Abnormal   Collection Time: 12/11/18  3:03 AM  Result Value Ref Range   WBC 11.5 (H) 4.0 - 10.5 K/uL   RBC 4.31 3.87 - 5.11 MIL/uL   Hemoglobin 13.4 12.0 - 15.0 g/dL   HCT 40.0 36.0 - 46.0 %   MCV 92.8 80.0 - 100.0 fL   MCH 31.1 26.0 - 34.0 pg   MCHC 33.5 30.0 - 36.0 g/dL   RDW 11.9 11.5 - 15.5 %   Platelets 409 (H) 150 - 400 K/uL   nRBC 0.0 0.0 - 0.2 %    Comment: Performed at [facility], [address] [place], [place] [address]  Urinalysis, Complete w Microscopic     Status: Abnormal   Collection Time: 12/11/18  3:03 AM  Result Value Ref Range   Color, Urine YELLOW (A) YELLOW   APPearance CLOUDY (A) CLEAR   Specific Gravity, Urine 1.027 1.005 - 1.030   pH 6.0 5.0 - 8.0   Glucose, UA NEGATIVE NEGATIVE mg/dL   Hgb urine dipstick NEGATIVE NEGATIVE   Bilirubin Urine NEGATIVE NEGATIVE   Ketones, ur NEGATIVE NEGATIVE mg/dL   Protein, ur 30 (A) NEGATIVE mg/dL   Nitrite NEGATIVE NEGATIVE   Leukocytes,Ua NEGATIVE NEGATIVE   RBC / HPF 6-10 0 - 5 RBC/hpf   WBC, UA 0-5 0 - 5 WBC/hpf   Bacteria, UA FEW (A) NONE SEEN   Squamous Epithelial / LPF 11-20 0 - 5   Mucus PRESENT    Hyaline Casts, UA PRESENT     Comment: Performed at [facility], [address], [place],  [address]  Pregnancy, urine     Status: None   Collection Time: 12/11/18  3:03 AM  Result Value Ref Range   Preg Test, Ur NEGATIVE NEGATIVE    Comment: Performed at [facility], [place], [place] [address]   Ct Abdomen Pelvis Wo Contrast  Result Date: 12/11/2018 CLINICAL DATA:  Lower abdominal pain and vomiting since last night EXAM: CT ABDOMEN AND PELVIS WITHOUT CONTRAST TECHNIQUE: Multidetector CT imaging of the abdomen and pelvis was performed following the standard protocol without IV contrast. COMPARISON:  None. FINDINGS: Lower chest: No acute abnormality. Hepatobiliary: There are multiple small subcentimeter nonspecific hypodensities scattered throughout the liver. Gallbladder is unremarkable. Pancreas: Unremarkable Spleen: Unremarkable Adrenals/Urinary Tract: Kidneys and adrenal glands are within normal limits. Bladder is within normal limits. Stomach/Bowel: The stomach is distended primarily with fluid. Proximal small bowel loops are distended and fluid-filled. Air-fluid levels are noted. Mid and distal small bowel are decompressed. The exact transition point is difficult to visualize. Normal appendix. Colon is relatively decompressed. No obvious mass in the colon. Vascular/Lymphatic: No evidence of aortic aneurysm. No abnormal retroperitoneal adenopathy. Reproductive: Uterus is absent. Adnexa are grossly within normal limits. Other: There is a small amount of free fluid layering in the pelvis. Musculoskeletal: There is no vertebral compression deformity. There is some degree of L4-5 and L5-S1 facet arthropathy. IMPRESSION: There are distended proximal small bowel loops and decompressed distal small bowel loops compatible with partial or early small bowel obstruction pattern. The exact transition point is difficult to visualize and obstruction may be due to adhesions. Multiple small hypodensities are scattered throughout the liver. These are nonspecific. If there is a history of malignancy or risk of malignancy, six-month follow-up MRI is recommended.  Electronically Signed   By: Marybelle Killings M.D.   On: 12/11/2018 07:44   US Pelvis Complete  Result Date: 12/11/2018 CLINICAL DATA:  Severe pelvic pain, history of prior hysterectomy EXAM: TRANSABDOMINAL ULTRASOUND OF PELVIS TECHNIQUE: Transabdominal ultrasound examination of the pelvis was performed including evaluation of the uterus, ovaries, adnexal regions, and pelvic cul-de-sac. COMPARISON:  None. FINDINGS: Uterus Surgically removed. Right ovary Not well visualized Left ovary Not well visualized. Other findings: No abnormal free fluid. Note is made of dilated fluid-filled bowel within the pelvis. A transvaginal exam was requested although patient terminated exam prior to the transvaginal portion. IMPRESSION: Status post hysterectomy.  The ovaries are not well visualized. Note is made of mildly prominent fluid-filled bowel within the pelvis. Electronically Signed   By: Inez Catalina M.D.   On: 12/11/2018 07:14    Pending Labs Unresulted Labs (From admission, onward)    Start     Ordered   12/18/18 0500  Creatinine, serum  (enoxaparin (LOVENOX)    CrCl >/= 30 ml/min)  Weekly,   STAT    Comments:  while on enoxaparin therapy    12/11/18 0857   12/12/18 2863  Basic metabolic panel  Tomorrow morning,   STAT     12/11/18 0857   12/12/18 0500  CBC  Tomorrow morning,   STAT     12/11/18 0857   12/11/18 0858  CBC  (enoxaparin (LOVENOX)    CrCl >/= 30 ml/min)  Once,   STAT    Comments:  Baseline for enoxaparin therapy IF NOT ALREADY DRAWN.  Notify MD if PLT < 100 K.    12/11/18 0857   12/11/18 0858  Creatinine, serum  (enoxaparin (LOVENOX)    CrCl >/= 30 ml/min)  Once,   STAT    Comments:  Baseline for enoxaparin therapy IF NOT ALREADY DRAWN.    12/11/18  9379   12/11/18 0803  SARS Coronavirus 2 (CEPHEID - Performed in [place] hospital lab), Hosp Order  (Asymptomatic Patients Labs)  Once,   STAT    Question:  Rule Out  Answer:  Yes   12/11/18 0802          Vitals/Pain Today's Vitals    12/11/18 0243 12/11/18 0245  BP:  104/87  Pulse:  94  Resp:  16  Temp:  98 F (36.7 C)  TempSrc:  Oral  SpO2:  100%  Weight: 86.2 kg   Height: 5\' 9"  (1.753 m)   PainSc: 10-Worst pain ever     Isolation Precautions No active isolations  Medications Medications  enoxaparin (LOVENOX) injection 40 mg (has no administration in time range)  dextrose 5 % in lactated ringers infusion (has no administration in time range)  ketorolac (TORADOL) 30 MG/ML injection 30 mg (has no administration in time range)    Followed by  ketorolac (TORADOL) 30 MG/ML injection 30 mg (has no administration in time range)  morphine 2 MG/ML injection 2-4 mg (has no administration in time range)  ondansetron (ZOFRAN-ODT) disintegrating tablet 4 mg (has no administration in time range)    Or  ondansetron (ZOFRAN) injection 4 mg (has no administration in time range)  hydrALAZINE (APRESOLINE) injection 10 mg (has no administration in time range)  ondansetron (ZOFRAN-ODT) disintegrating tablet 4 mg (4 mg Oral Given 12/11/18 0319)  morphine 4 MG/ML injection 4 mg (4 mg Intravenous Given 12/11/18 0659)  HYDROmorphone (DILAUDID) injection 1 mg (1 mg Intravenous Given 12/11/18 0658)  HYDROmorphone (DILAUDID) injection 1 mg (1 mg Intravenous Given 12/11/18 0240)    Mobility walks Low fall risk   Focused Assessments Gastrointestinal Assessment   R Recommendations: See Admitting Provider Note  Report given to:   Additional Notes:

## 2018-12-11 NOTE — ED Notes (Signed)
Pt yelling in triage, then immediately calms with reassurance. Pt states pain is wave like and when It "comes I have to yell it out".

## 2018-12-11 NOTE — H&P (Signed)
[facility] SURGICAL HISTORY & PHYSICAL (cpt [phone])  HISTORY OF PRESENT ILLNESS (HPI):  48 y.o. female presented to [facility] ED this morning for abdominal pain. Patient reports the acute onset of diffuse abdominal pain yesterday evening around 9 PM which she notes is worse in her LUQ. She described this as a sharp and crampy pain. This is worse with movement and palpation. Nothing seems to make the pain better. She endroses nausea and emesis with the pain. She attempted to drink tea last night but had an episode of emesis immediately following. No fever, chills, cough, congest, CP, SOB, or bladder changes. Her last BM was yesterday morning and denied passing flatus since that time and has noticed some burping. No history of similar pain in the past. Previous abdominal surgery positive for laparoscopic assisted vaginal hysterectomy in October 2016. Work up in the ED was concerning for mild leukocytosis and partial small bowel obstruction.   General surgery is consulted by emergency medicine physician Dr. Jacinta Shoe, MD for evaluation and management of partial small bowel obstruction.    PAST MEDICAL HISTORY (PMH):  Past Medical History:  Diagnosis Date  . ADHD (attention deficit hyperactivity disorder)   . Allergy   . Anxiety   . Chronic pelvic pain in female   . Complex ovarian cyst   . Depression   . Dyspareunia   . Endometriosis   . Fibroid   . GERD (gastroesophageal reflux disease)   . Heavy period   . MS (multiple sclerosis) ()   . Painful menstruation   . Rectocele     Reviewed. Otherwise negative.   PAST SURGICAL HISTORY ([place]):  Past Surgical History:  Procedure Laterality Date  . ABDOMINAL HYSTERECTOMY  05/26/2015  . BREAST BIOPSY Right 2005   benign  . LAPAROSCOPIC ASSISTED VAGINAL HYSTERECTOMY N/A 05/26/2015   Procedure: LAPAROSCOPIC ASSISTED VAGINAL HYSTERECTOMY, with right salpingectomy;  Surgeon: Brayton Mars, MD;  Location: [facility] ORS;   Service: Gynecology;  Laterality: N/A;  . OVARY SURGERY Left 09/30/2014  . RECTOCELE REPAIR N/A 05/26/2015   Procedure: POSTERIOR REPAIR (RECTOCELE);  Surgeon: Brayton Mars, MD;  Location: [facility] ORS;  Service: Gynecology;  Laterality: N/A;    Reviewed. Otherwise negative.   MEDICATIONS:  Prior to Admission medications   Medication Sig Start Date End Date Taking? Authorizing Provider  albuterol (PROVENTIL HFA;VENTOLIN HFA) 108 (90 BASE) MCG/ACT inhaler Inhale 1 puff into the lungs every 6 (six) hours as needed for wheezing or shortness of breath. Reported on 09/26/2015   Yes [provider]  amphetamine-dextroamphetamine (ADDERALL XR) 20 MG 24 hr capsule Take 1 capsule (20 mg total) by mouth every morning. Patient taking differently: Take 20 mg by mouth every morning. On weekdays 10/11/18  Yes Sowles, Drue [place], MD  amphetamine-dextroamphetamine (ADDERALL) 10 MG tablet Take 1 tablet (10 mg total) by mouth daily as needed. On weekends when you sleep in Patient taking differently: Take 10 mg by mouth daily. On weekends 07/11/18  Yes Sowles, Drue [place], MD  cetirizine (ZYRTEC ALLERGY) 10 MG tablet Take 10 mg by mouth at bedtime.    Yes [provider]  DULoxetine (CYMBALTA) 30 MG capsule Take 3 capsules (90 mg total) by mouth daily. [place] psychiatrist Patient taking differently: Take 90 mg by mouth at bedtime.  10/04/17  Yes Sowles, Drue Stager, MD  EPINEPHrine 0.3 mg/0.3 mL IJ SOAJ injection Inject 0.3 mg into the muscle as needed for anaphylaxis.    Yes [provider]  fluticasone (FLONASE) 50 MCG/ACT nasal spray  Place 2 sprays into both nostrils daily. 04/07/18  Yes Sowles, Drue Stager, MD  levocetirizine (XYZAL) 5 MG tablet Take 1 tablet (5 mg total) by mouth every evening. Patient taking differently: Take 5 mg by mouth daily.  01/04/18  Yes Sowles, Drue Stager, MD  meloxicam (MOBIC) 7.5 MG tablet TAKE 1 TABLET BY MOUTH  DAILY Patient taking differently: Take 7.5 mg by mouth daily as  needed for pain.  09/11/18  Yes Sowles, Drue Stager, MD  montelukast (SINGULAIR) 10 MG tablet Take 1 tablet (10 mg total) by mouth at bedtime. 10/11/18  Yes Sowles, Drue Stager, MD  polyethylene glycol powder (GLYCOLAX/MIRALAX) powder Take 17 g by mouth 2 (two) times daily as needed. Patient taking differently: Take 17 g by mouth 2 (two) times daily as needed for mild constipation.  07/11/18  Yes Sowles, Drue Stager, MD  QUEtiapine (SEROQUEL) 25 MG tablet Take 1 tablet (25 mg total) by mouth at bedtime. 10/11/18  Yes Steele Sizer, MD     ALLERGIES:  Allergies  Allergen Reactions  . Penicillins Anaphylaxis    Did it involve swelling of the face/tongue/throat, SOB, or low BP? Yes Did it involve sudden or severe rash/hives, skin peeling, or any reaction on the inside of your mouth or nose? No Did you need to seek medical attention at a hospital or doctor's office? Yes When did it last happen?A long time ago If all above answers are "NO", may proceed with cephalosporin use.  Raelyn Ensign Venom     Bee     SOCIAL HISTORY:  Social History   Socioeconomic History  . Marital status: Legally Separated    Spouse name: Not on file  . Number of children: 2  . Years of education: Not on file  . Highest education level: Associate degree: academic program  Occupational History  . Occupation: Proofreader     Comment: [employer]   Social Needs  . Financial resource strain: Not hard at all  . Food insecurity:    Worry: Never true    Inability: Never true  . Transportation needs:    Medical: No    Non-medical: No  Tobacco Use  . Smoking status: Never Smoker  . Smokeless tobacco: Never Used  Substance and Sexual Activity  . Alcohol use: Yes    Alcohol/week: 14.0 standard drinks    Types: 14 Shots of liquor per week    Comment: occasionally, 1-2 shots every night  . Drug use: No  . Sexual activity: Yes    Partners: Male    Birth control/protection: None    Comment: separated   Lifestyle  . Physical  activity:    Days per week: 0 days    Minutes per session: 0 min  . Stress: Rather much  Relationships  . Social connections:    Talks on phone: More than three times a week    Gets together: More than three times a week    Attends religious service: Never    Active member of club or organization: No    Attends meetings of clubs or organizations: Never    Relationship status: Married  . Intimate partner violence:    Fear of current or ex partner: No    Emotionally abused: No    Physically abused: No    Forced sexual activity: No  Other Topics Concern  . Not on file  Social History Narrative   She is from [place].    Moved to [place] in 1998 because of husband's job transfer, that is  when she stopped working -pending her green-card.   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    She had symptoms of MS in 2000 , daughter was 61 year old and just now found a job at [employer] March 2019   Separated since July 2019         FAMILY HISTORY:  Family History  Problem Relation Age of Onset  . Anemia Mother   . Osteoporosis Mother   . Mental illness Father        Bipolar  . Arthritis Brother   . Breast cancer Maternal Aunt     Otherwise negative.   REVIEW OF SYSTEMS:  Review of Systems  Constitutional: Negative for chills and fever.  Respiratory: Negative for cough and shortness of breath.   Cardiovascular: Negative for chest pain and palpitations.  Gastrointestinal: Positive for abdominal pain, nausea and vomiting. Negative for constipation and diarrhea.  Genitourinary: Negative for dysuria and urgency.  Neurological: Negative for dizziness and headaches.  All other systems reviewed and are negative.   VITAL SIGNS:  Temp:  [98 F (36.7 C)] 98 F (36.7 C) (05/18 0245) Pulse Rate:  [94] 94 (05/18 0245) Resp:  [16] 16 (05/18 0245) BP: (104)/(87) 104/87 (05/18 0245) SpO2:  [100 %] 100 % (05/18 0245) Weight:  [86.2 kg] 86.2 kg (05/18 0243)     Height: 5\' 9"  (175.3 cm) Weight: 86.2  kg BMI (Calculated): 28.05   PHYSICAL EXAM:  Physical Exam Vitals signs and nursing note reviewed.  Constitutional:      General: She is not in acute distress.    Appearance: She is well-developed. She is obese. She is not ill-appearing.  HENT:     Head: Normocephalic and atraumatic.  Eyes:     General: No scleral icterus.    Extraocular Movements: Extraocular movements intact.  Cardiovascular:     Rate and Rhythm: Normal rate and regular rhythm.     Heart sounds: Normal heart sounds. No murmur. No friction rub. No gallop.   Pulmonary:     Effort: Pulmonary effort is normal. No respiratory distress.     Breath sounds: Normal breath sounds. No wheezing or rhonchi.  Abdominal:     General: A surgical scar is present. There is no distension.     Palpations: Abdomen is soft.     Tenderness: There is generalized abdominal tenderness. There is no guarding or rebound.     Comments: Diffusely tender abdomen, worse in LUQ although difficult to access point tenderness, no peritoneal signs  Genitourinary:    Comments: Deferred Skin:    General: Skin is warm and dry.     Coloration: Skin is not jaundiced or pale.  Neurological:     General: No focal deficit present.     Mental Status: She is alert and oriented to person, place, and time.  Psychiatric:        Mood and Affect: Mood normal.        Behavior: Behavior normal.     INTAKE/OUTPUT:  This shift: No intake/output data recorded.  Last 2 shifts: @IOLAST2SHIFTS @  Labs:  CBC Latest Ref Rng & Units 12/11/2018 04/26/2018 03/09/2017  WBC 4.0 - 10.5 K/uL 11.5(H) 7.0 7.3  Hemoglobin 12.0 - 15.0 g/dL 13.4 12.5 12.5  Hematocrit 36.0 - 46.0 % 40.0 38.2 37.9  Platelets 150 - 400 K/uL 409(H) 452(H) -   CMP Latest Ref Rng & Units 12/11/2018 04/26/2018 03/09/2017  Glucose 70 - 99 mg/dL 137(H) 95 99  BUN 6 -  20 mg/dL 15 14 11   Creatinine 0.44 - 1.00 mg/dL 0.93 1.08(H) 0.98  Sodium 135 - 145 mmol/L 138 139 139  Potassium 3.5 - 5.1 mmol/L 3.8  4.9 4.4  Chloride 98 - 111 mmol/L 102 99 100  CO2 22 - 32 mmol/L 25 26 -  Calcium 8.9 - 10.3 mg/dL 9.1 9.5 9.2  Total Protein 6.5 - 8.1 g/dL 7.7 6.9 6.9  Total Bilirubin 0.3 - 1.2 mg/dL 1.0 0.3 0.4  Alkaline Phos 38 - 126 U/L 78 93 78  AST 15 - 41 U/L 16 15 17   ALT 0 - 44 U/L 16 11 -     Imaging studies:   Pelvic US (12/11/2018) personally reviewed and radiologist report reviewed:  IMPRESSION: 1) Status post hysterectomy.  The ovaries are not well visualized. 2) Note is made of mildly prominent fluid-filled bowel within the Pelvis.   CT Abdomen/Pelvis (12/11/2018) personally reviewed and radiologist report reviewed:  IMPRESSION: 1) There are distended proximal small bowel loops and decompressed distal small bowel loops compatible with partial or early small bowel obstruction pattern. The exact transition point is difficult to visualize and obstruction may be due to adhesions. 2) Multiple small hypodensities are scattered throughout the liver. These are nonspecific. If there is a history of malignancy or risk of malignancy, six-month follow-up MRI is recommended.   Assessment/Plan: (ICD-10's: K91.51) 48 y.o. female with partial small bowel obstruction, likely attributable to post-surgical adhesions following LAVH in 7035, complicated by pertinent comorbidities including ADHD, anxiety/depression, GERD, multiple sclerosis, and obesity.  - NPO for now, IV fluids             - insert NG tube for nasogastric decompression             - monitor ongoing bowel function and abdominal exam              - anticipate symptomatic relief within 24 - 48 hours following NGT insertion, followed by "rumbling" the following day and flatus either the same day or the day following the "rumbling" with anticipated length of stay ~3 - 5 days with successful non-operative management for 8 of 10 patients with small bowel obstruction attributed to post-surgical adhesions  - surgical intervention  if doesn't improve was also discussed             - medical management comorbidities             - ambulation encouraged              - DVT prophylaxis  All of the above findings and recommendations were discussed with the patient, and all of her questions were answered to her expressed satisfaction.  -- Edison Simon, PA-C [facility] 12/11/2018, 8:24 AM [phone] M-F: 7am - 4pm

## 2018-12-11 NOTE — ED Triage Notes (Addendum)
Pt states lower abd pain with vomiting since 2100. Pt denies diarrhea. Pt is lying on floor, and refuses to get up. Skin normal color warm and dry. Pt states pain radiates up to luq.

## 2018-12-11 NOTE — ED Notes (Signed)
Pt to ultrasound

## 2018-12-11 NOTE — ED Notes (Signed)
Pt thrashing around in chair in triage while rn attempting to adminsiter fentanyl. Pt informed she must remain still long enough for vital signs to be obtained prior to fentanyl adminstration. Pt immediately stops thrashing and yelling.

## 2018-12-12 LAB — BASIC METABOLIC PANEL
Anion gap: 11 (ref 5–15)
BUN: 15 mg/dL (ref 6–20)
CO2: 27 mmol/L (ref 22–32)
Calcium: 9.2 mg/dL (ref 8.9–10.3)
Chloride: 103 mmol/L (ref 98–111)
Creatinine, Ser: 0.95 mg/dL (ref 0.44–1.00)
GFR calc Af Amer: >60 mL/min (ref >60)
GFR calc non Af Amer: >60 mL/min (ref >60)
Glucose, Bld: 127 mg/dL — ABNORMAL HIGH (ref 70–99)
Potassium: 3.4 mmol/L — ABNORMAL LOW (ref 3.5–5.1)
Sodium: 141 mmol/L (ref 135–145)

## 2018-12-12 LAB — CBC
HCT: 38.9 % (ref 36.0–46.0)
Hemoglobin: 12.7 g/dL (ref 12.0–15.0)
MCH: 30.7 pg (ref 26.0–34.0)
MCHC: 32.6 g/dL (ref 30.0–36.0)
MCV: 94 fL (ref 80.0–100.0)
Platelets: 409 10*3/uL — ABNORMAL HIGH (ref 150–400)
RBC: 4.14 MIL/uL (ref 3.87–5.11)
RDW: 12.1 % (ref 11.5–15.5)
WBC: 11.1 10*3/uL — ABNORMAL HIGH (ref 4.0–10.5)
nRBC: 0 % (ref 0.0–0.2)

## 2018-12-12 MED ORDER — PHENOL 1.4 % MT LIQD
1.0000 | OROMUCOSAL | Status: DC | PRN
  Administered 2018-12-13 – 2018-12-14 (×2): 1 via OROMUCOSAL
  Filled 2018-12-12: qty 177

## 2018-12-12 MED ORDER — POTASSIUM CHLORIDE 10 MEQ/100ML IV SOLN
10.0000 meq | INTRAVENOUS | Status: AC
  Administered 2018-12-12 (×4): 10 meq via INTRAVENOUS
  Filled 2018-12-12 (×4): qty 100

## 2018-12-12 NOTE — Progress Notes (Addendum)
[facility] SURGICAL PROGRESS NOTE (cpt [id number])  Hospital Day(s): 1.   Post op day(s):  Robin Chan   Interval History: Patient seen and examined, no acute events or new complaints overnight. Patient reports some abdominal soreness but this is improved from yesterday. No fever, chills, or emesis. She did report some nausea overnight, which responded to antiemetics. NGT with 650 ccs out in last 24 hours. No flatus or BM. Has not mobilized. No other complaints or concerns this morning.    Review of Systems:  Constitutional: denies fever, chills  Respiratory: denies any shortness of breath  Cardiovascular: denies chest pain or palpitations  Gastrointestinal: + Abdominal soreness, + nausea, denies emesis Genitourinary: denies burning with urination or urinary frequency Integumentary: denies any other rashes or skin discolorations   Vital signs in last 24 hours: [min-max] current  Temp:  [97.7 F (36.5 C)-98.9 F (37.2 C)] 98.9 F (37.2 C) (05/19 1884) Pulse Rate:  [58-78] 78 (05/19 0638) Resp:  [17-20] 20 (05/19 1660) BP: (125-163)/(72-99) 149/93 (05/19 6301) SpO2:  [95 %-98 %] 95 % (05/19 0638)     Height: 5\' 9"  (175.3 cm) Weight: 86.2 kg BMI (Calculated): 28.05   Intake/Output this shift:  No intake/output data recorded.     Physical Exam:  Constitutional: alert, cooperative and no distress  HENT: normocephalic without obvious abnormality, NGT in place  Eyes: PERRL, EOM's grossly intact and symmetric   Respiratory: breathing non-labored at rest  Cardiovascular: regular rate and sinus rhythm  Gastrointestinal: soft, diffuse soreness, and non-distended, no rebound, no guarding Musculoskeletal: no edema or wounds, motor and sensation grossly intact, NT    Labs:  CBC Latest Ref Rng & Units 12/12/2018 12/11/2018 04/26/2018  WBC 4.0 - 10.5 K/uL 11.1(H) 11.5(H) 7.0  Hemoglobin 12.0 - 15.0 g/dL 12.7 13.4 12.5  Hematocrit 36.0 - 46.0 % 38.9 40.0 38.2  Platelets 150 - 400 K/uL  409(H) 409(H) 452(H)   CMP Latest Ref Rng & Units 12/12/2018 12/11/2018 04/26/2018  Glucose 70 - 99 mg/dL 127(H) 137(H) 95  BUN 6 - 20 mg/dL 15 15 14   Creatinine 0.44 - 1.00 mg/dL 0.95 0.93 1.08(H)  Sodium 135 - 145 mmol/L 141 138 139  Potassium 3.5 - 5.1 mmol/L 3.4(L) 3.8 4.9  Chloride 98 - 111 mmol/L 103 102 99  CO2 22 - 32 mmol/L 27 25 26   Calcium 8.9 - 10.3 mg/dL 9.2 9.1 9.5  Total Protein 6.5 - 8.1 g/dL - 7.7 6.9  Total Bilirubin 0.3 - 1.2 mg/dL - 1.0 0.3  Alkaline Phos 38 - 126 U/L - 78 93  AST 15 - 41 U/L - 16 15  ALT 0 - 44 U/L - 16 11    Imaging studies: No new pertinent imaging studies   Assessment/Plan: (ICD-10's: K20.51) 48 y.o. female with mild hypokalemia and abdominal soreness without evidence of bowel function currently likely attributable to post-surgical adhesions following LAVH in 6010, complicated by pertinent comorbidities including ADHD, anxiety/depression, GERD, multiple sclerosis, and obesity   - NPO + IVF  - Continue NGT to low intermittent suction for decompression; monitor output; flush to prevent clogging   - Pain control prn; antiemetics prn  - Monitor abdominal examination; on-going bowel fucntion   - Replete K+; monitor  - No indication for emergent surgical intervention although she understands if she fails conservative management she may require intervention   - medical management comorbidities - ambulation encouraged - DVT prophylaxis  All of the above findings and recommendations were discussed with the patient, and  all of her questions were answered to her expressed satisfaction.   -- Edison Simon, PA-C [facility] 12/12/2018, 9:00 AM [phone] M-F: 7am - 4pm  I saw and evaluated the patient.  I agree with the above documentation, exam, and plan, which I have edited where appropriate. Fredirick Maudlin  9:20 AM

## 2018-12-13 ENCOUNTER — Inpatient Hospital Stay: Payer: BLUE CROSS/BLUE SHIELD

## 2018-12-13 LAB — BASIC METABOLIC PANEL
Anion gap: 8 (ref 5–15)
BUN: 15 mg/dL (ref 6–20)
CO2: 28 mmol/L (ref 22–32)
Calcium: 8.7 mg/dL — ABNORMAL LOW (ref 8.9–10.3)
Chloride: 101 mmol/L (ref 98–111)
Creatinine, Ser: 0.86 mg/dL (ref 0.44–1.00)
GFR calc Af Amer: >60 mL/min (ref >60)
GFR calc non Af Amer: >60 mL/min (ref >60)
Glucose, Bld: 114 mg/dL — ABNORMAL HIGH (ref 70–99)
Potassium: 3.3 mmol/L — ABNORMAL LOW (ref 3.5–5.1)
Sodium: 137 mmol/L (ref 135–145)

## 2018-12-13 MED ORDER — FLUTICASONE PROPIONATE 50 MCG/ACT NA SUSP
2.0000 | Freq: Every day | NASAL | Status: DC
  Administered 2018-12-14 – 2018-12-16 (×3): 2 via NASAL
  Filled 2018-12-13: qty 16

## 2018-12-13 MED ORDER — DIATRIZOATE MEGLUMINE & SODIUM 66-10 % PO SOLN
90.0000 mL | Freq: Once | ORAL | Status: AC
  Administered 2018-12-13: 13:00:00 90 mL via NASOGASTRIC

## 2018-12-13 MED ORDER — POTASSIUM CHLORIDE 10 MEQ/100ML IV SOLN
10.0000 meq | INTRAVENOUS | Status: AC
  Administered 2018-12-13 (×4): 10 meq via INTRAVENOUS
  Filled 2018-12-13 (×4): qty 100

## 2018-12-13 MED ORDER — ALBUTEROL SULFATE (2.5 MG/3ML) 0.083% IN NEBU: 3.0000 mL | INHALATION_SOLUTION | Freq: Four times a day (QID) | RESPIRATORY_TRACT | Status: DC | PRN

## 2018-12-13 NOTE — Progress Notes (Signed)
Per Zack RN needs to clamp NG for gastrografin challenge. It will be clamp until tomorrow once surgeon has seen the series of xray's, will continue to acess and monitor pt.

## 2018-12-13 NOTE — Progress Notes (Signed)
Per Dr. Hampton Abbot okay for RN to place pt back to intermittent suction. Radiology will follow up with an xray around 9 pm. And we will keep pt to suction over night.

## 2018-12-13 NOTE — Progress Notes (Addendum)
[facility] SURGICAL PROGRESS NOTE (cpt [phone])  Hospital Day(s): 2.   Post op day(s):  Robin Chan   Interval History: Patient seen and examined, no acute events or new complaints overnight. Patient reports that she is feeling a little better this morning but still with some diffuse abdominal soreness. She denied any fevers, chills, nausea, or emesis. She did think she passed flatus yesterday but no more than once. She has not been mobilizing outside of the room. NGT output recorded as 900 ccs overnight for a total of 1.4L in last 24 hours. No other complaints.   Review of Systems:  Constitutional: denies fever, chills  HEENT: denies cough or congestion  Respiratory: denies any shortness of breath  Cardiovascular: denies chest pain or palpitations  Gastrointestinal: + abdominal pain (soreness), denies N/V, or diarrhea/and bowel function as per interval history Genitourinary: denies burning with urination or urinary frequency   Vital signs in last 24 hours: [min-max] current  Temp:  [97.3 F (36.3 C)-98.6 F (37 C)] 98.6 F (37 C) (05/20 0543) Pulse Rate:  [67-80] 67 (05/20 0543) Resp:  [16-18] 18 (05/20 0543) BP: (137-143)/(82-89) 142/83 (05/20 0543) SpO2:  [97 %-98 %] 97 % (05/20 0543)     Height: 5\' 9"  (175.3 cm) Weight: 86.2 kg BMI (Calculated): 28.05   Intake/Output this shift:  No intake/output data recorded.    Physical Exam:  Constitutional: alert, cooperative and no distress  HENT: normocephalic without obvious abnormality, NGT in place  Eyes: PERRL, EOM's grossly intact and symmetric   Respiratory: breathing non-labored at rest  Cardiovascular: regular rate and sinus rhythm  Gastrointestinal: soft, diffuse soreness (improved from presentation), and non-distended, no rebound, no guarding Musculoskeletal: no edema or wounds, motor and sensation grossly intact, NT    Labs:  CBC Latest Ref Rng & Units 12/12/2018 12/11/2018 04/26/2018  WBC 4.0 - 10.5 K/uL 11.1(H)  11.5(H) 7.0  Hemoglobin 12.0 - 15.0 g/dL 12.7 13.4 12.5  Hematocrit 36.0 - 46.0 % 38.9 40.0 38.2  Platelets 150 - 400 K/uL 409(H) 409(H) 452(H)   CMP Latest Ref Rng & Units 12/12/2018 12/11/2018 04/26/2018  Glucose 70 - 99 mg/dL 127(H) 137(H) 95  BUN 6 - 20 mg/dL 15 15 14   Creatinine 0.44 - 1.00 mg/dL 0.95 0.93 1.08(H)  Sodium 135 - 145 mmol/L 141 138 139  Potassium 3.5 - 5.1 mmol/L 3.4(L) 3.8 4.9  Chloride 98 - 111 mmol/L 103 102 99  CO2 22 - 32 mmol/L 27 25 26   Calcium 8.9 - 10.3 mg/dL 9.2 9.1 9.5  Total Protein 6.5 - 8.1 g/dL - 7.7 6.9  Total Bilirubin 0.3 - 1.2 mg/dL - 1.0 0.3  Alkaline Phos 38 - 126 U/L - 78 93  AST 15 - 41 U/L - 16 15  ALT 0 - 44 U/L - 16 11     Imaging studies: No new pertinent imaging studies   Assessment/Plan: (ICD-10's: K19.51) 48 y.o. female with abdominal soreness and no significant signs of bowel function return likely attributable to post-surgical adhesions followingLAVH in 2536, complicated by pertinent comorbidities includingADHD, anxiety/depression, GERD, multiple sclerosis, and obesity   - Will get KUB this morning to reassess bowel obstruction  - NPO + IVF             - Continue NGT to low intermittent suction for decompression; monitor output; flush to prevent clogging              - Pain control prn; antiemetics prn             -  Monitor abdominal examination; on-going bowel fucntion   - replete K+; monitor   - No indication for emergent surgical intervention although she understands if she fails conservative management she may require intervention this hospitalization             - medical management comorbidities - ambulation encouraged - DVT prophylaxis   All of the above findings and recommendations were discussed with the patient, and the medical team, and all of patient's questions were answered to her expressed satisfaction.  -- Edison Simon, PA-C [facility] 12/13/2018, 7:48  AM [phone] M-F: 7am - 4pm

## 2018-12-14 ENCOUNTER — Inpatient Hospital Stay: Payer: BLUE CROSS/BLUE SHIELD | Admitting: Anesthesiology

## 2018-12-14 ENCOUNTER — Encounter
Admission: EM | Disposition: A | Discharge status: Home / Self Care | Payer: Self-pay | Source: Home / Self Care | Attending: Surgery

## 2018-12-14 DIAGNOSIS — K56600 Partial intestinal obstruction, unspecified as to cause: Secondary | ICD-10-CM

## 2018-12-14 HISTORY — PX: LAPAROTOMY: SHX154

## 2018-12-14 LAB — BASIC METABOLIC PANEL
Anion gap: 10 (ref 5–15)
BUN: 17 mg/dL (ref 6–20)
CO2: 29 mmol/L (ref 22–32)
Calcium: 8.9 mg/dL (ref 8.9–10.3)
Chloride: 102 mmol/L (ref 98–111)
Creatinine, Ser: 0.94 mg/dL (ref 0.44–1.00)
GFR calc Af Amer: >60 mL/min (ref >60)
GFR calc non Af Amer: >60 mL/min (ref >60)
Glucose, Bld: 115 mg/dL — ABNORMAL HIGH (ref 70–99)
Potassium: 3.4 mmol/L — ABNORMAL LOW (ref 3.5–5.1)
Sodium: 141 mmol/L (ref 135–145)

## 2018-12-14 LAB — SURGICAL PCR SCREEN
MRSA, PCR: NEGATIVE
Staphylococcus aureus: NEGATIVE

## 2018-12-14 SURGERY — LAPAROTOMY, EXPLORATORY
Anesthesia: General

## 2018-12-14 MED ORDER — SUCCINYLCHOLINE CHLORIDE 20 MG/ML IJ SOLN
INTRAMUSCULAR | Status: DC | PRN
  Administered 2018-12-14: 120 mg via INTRAVENOUS

## 2018-12-14 MED ORDER — HYDROMORPHONE HCL 1 MG/ML IJ SOLN
0.5000 mg | INTRAMUSCULAR | Status: DC | PRN
  Administered 2018-12-14 – 2018-12-15 (×2): 0.5 mg via INTRAVENOUS
  Filled 2018-12-14 (×2): qty 0.5

## 2018-12-14 MED ORDER — PROPOFOL 10 MG/ML IV BOLUS
INTRAVENOUS | Status: DC | PRN
  Administered 2018-12-14: 130 mg via INTRAVENOUS

## 2018-12-14 MED ORDER — SODIUM CHLORIDE 0.9 % IV SOLN
INTRAVENOUS | Status: DC | PRN
  Administered 2018-12-14: 50 ug/min via INTRAVENOUS

## 2018-12-14 MED ORDER — KCL-LACTATED RINGERS-D5W 20 MEQ/L IV SOLN
INTRAVENOUS | Status: DC
  Administered 2018-12-14 (×2): via INTRAVENOUS
  Filled 2018-12-14 (×5): qty 1000

## 2018-12-14 MED ORDER — ACETAMINOPHEN 10 MG/ML IV SOLN
INTRAVENOUS | Status: DC | PRN
  Administered 2018-12-14: 1000 mg via INTRAVENOUS

## 2018-12-14 MED ORDER — MENTHOL 3 MG MT LOZG
1.0000 | LOZENGE | OROMUCOSAL | Status: DC | PRN
  Administered 2018-12-14 (×4): 3 mg via ORAL
  Filled 2018-12-14: qty 9

## 2018-12-14 MED ORDER — CLINDAMYCIN PHOSPHATE 900 MG/50ML IV SOLN
900.0000 mg | INTRAVENOUS | Status: AC
  Administered 2018-12-14: 900 mg via INTRAVENOUS
  Filled 2018-12-14: qty 50

## 2018-12-14 MED ORDER — MIDAZOLAM HCL 2 MG/2ML IJ SOLN
INTRAMUSCULAR | Status: DC | PRN
  Administered 2018-12-14: 2 mg via INTRAVENOUS

## 2018-12-14 MED ORDER — BUPIVACAINE-EPINEPHRINE 0.25% -1:200000 IJ SOLN
INTRAMUSCULAR | Status: DC | PRN
  Administered 2018-12-14: 30 mL

## 2018-12-14 MED ORDER — DEXAMETHASONE SODIUM PHOSPHATE 10 MG/ML IJ SOLN
INTRAMUSCULAR | Status: DC | PRN
  Administered 2018-12-14: 10 mg via INTRAVENOUS

## 2018-12-14 MED ORDER — FENTANYL CITRATE (PF) 100 MCG/2ML IJ SOLN: 25.0000 ug | INTRAMUSCULAR | Status: DC | PRN

## 2018-12-14 MED ORDER — FENTANYL CITRATE (PF) 100 MCG/2ML IJ SOLN
INTRAMUSCULAR | Status: AC
  Filled 2018-12-14: qty 2

## 2018-12-14 MED ORDER — ACETAMINOPHEN 10 MG/ML IV SOLN
INTRAVENOUS | Status: AC
  Filled 2018-12-14: qty 100

## 2018-12-14 MED ORDER — LIDOCAINE HCL (CARDIAC) PF 100 MG/5ML IV SOSY
PREFILLED_SYRINGE | INTRAVENOUS | Status: DC | PRN
  Administered 2018-12-14: 100 mg via INTRAVENOUS

## 2018-12-14 MED ORDER — SODIUM CHLORIDE 0.9 % IV SOLN
INTRAVENOUS | Status: DC
  Administered 2018-12-14 (×2): via INTRAVENOUS

## 2018-12-14 MED ORDER — ROCURONIUM BROMIDE 100 MG/10ML IV SOLN
INTRAVENOUS | Status: DC | PRN
  Administered 2018-12-14: 10 mg via INTRAVENOUS

## 2018-12-14 MED ORDER — SODIUM CHLORIDE 0.9 % IV SOLN
INTRAVENOUS | Status: DC | PRN
  Administered 2018-12-14: 30 mL

## 2018-12-14 MED ORDER — MIDAZOLAM HCL 2 MG/2ML IJ SOLN
INTRAMUSCULAR | Status: AC
  Filled 2018-12-14: qty 2

## 2018-12-14 MED ORDER — BUPIVACAINE LIPOSOME 1.3 % IJ SUSP
INTRAMUSCULAR | Status: AC
  Filled 2018-12-14: qty 20

## 2018-12-14 MED ORDER — ONDANSETRON HCL 4 MG/2ML IJ SOLN: 4.0000 mg | Freq: Once | INTRAMUSCULAR | Status: DC | PRN

## 2018-12-14 MED ORDER — FENTANYL CITRATE (PF) 100 MCG/2ML IJ SOLN
INTRAMUSCULAR | Status: DC | PRN
  Administered 2018-12-14 (×3): 50 ug via INTRAVENOUS

## 2018-12-14 MED ORDER — LACTATED RINGERS IV SOLN
INTRAVENOUS | Status: DC | PRN
  Administered 2018-12-14: 14:00:00 via INTRAVENOUS

## 2018-12-14 MED ORDER — BUPIVACAINE-EPINEPHRINE (PF) 0.25% -1:200000 IJ SOLN
INTRAMUSCULAR | Status: AC
  Filled 2018-12-14: qty 30

## 2018-12-14 MED ORDER — PROPOFOL 10 MG/ML IV BOLUS
INTRAVENOUS | Status: AC
  Filled 2018-12-14: qty 20

## 2018-12-14 SURGICAL SUPPLY — 33 items
CANISTER SUCT 1200ML W/VALVE (MISCELLANEOUS) ×3 IMPLANT
CHLORAPREP W/TINT 10.5 ML (MISCELLANEOUS) ×3 IMPLANT
COVER WAND RF STERILE (DRAPES) ×3 IMPLANT
DRAPE LAPAROTOMY 100X77 ABD (DRAPES) ×3 IMPLANT
DRSG OPSITE POSTOP 4X12 (GAUZE/BANDAGES/DRESSINGS) IMPLANT
DRSG OPSITE POSTOP 4X8 (GAUZE/BANDAGES/DRESSINGS) ×3 IMPLANT
DRSG TEGADERM 4X10 (GAUZE/BANDAGES/DRESSINGS) IMPLANT
ELECT CAUTERY BLADE 6.4 (BLADE) ×3 IMPLANT
ELECT REM PT RETURN 9FT ADLT (ELECTROSURGICAL) ×3
ELECTRODE REM PT RTRN 9FT ADLT (ELECTROSURGICAL) ×1 IMPLANT
GAUZE SPONGE 4X4 12PLY STRL (GAUZE/BANDAGES/DRESSINGS) IMPLANT
GLOVE SURG SYN 7.0 (GLOVE) ×6 IMPLANT
GLOVE SURG SYN 7.5  E (GLOVE) ×4
GLOVE SURG SYN 7.5 E (GLOVE) ×2 IMPLANT
GOWN STRL REUS W/ TWL LRG LVL3 (GOWN DISPOSABLE) ×4 IMPLANT
GOWN STRL REUS W/TWL LRG LVL3 (GOWN DISPOSABLE) ×8
LABEL OR SOLS (LABEL) ×3 IMPLANT
LIGASURE IMPACT 36 18CM CVD LR (INSTRUMENTS) IMPLANT
NEEDLE HYPO 22GX1.5 SAFETY (NEEDLE) ×3 IMPLANT
NS IRRIG 1000ML POUR BTL (IV SOLUTION) ×3 IMPLANT
PACK BASIN MAJOR ARMC (MISCELLANEOUS) ×3 IMPLANT
PACK COLON CLEAN CLOSURE (MISCELLANEOUS) IMPLANT
SEPRAFILM MEMBRANE 5X6 (MISCELLANEOUS) IMPLANT
STAPLER SKIN PROX 35W (STAPLE) ×3 IMPLANT
SUT PDS AB 1 CT1 36 (SUTURE) ×6 IMPLANT
SUT PROLENE 0 CT 1 30 (SUTURE) ×3 IMPLANT
SUT SILK 2 0 (SUTURE) ×2
SUT SILK 2-0 18XBRD TIE 12 (SUTURE) ×1 IMPLANT
SUT SILK 3-0 (SUTURE) ×3 IMPLANT
SUT VIC AB 3-0 SH 27 (SUTURE) ×2
SUT VIC AB 3-0 SH 27X BRD (SUTURE) ×1 IMPLANT
SYR 10ML LL (SYRINGE) ×3 IMPLANT
TRAY FOLEY MTR SLVR 16FR STAT (SET/KITS/TRAYS/PACK) ×3 IMPLANT

## 2018-12-14 NOTE — Anesthesia Procedure Notes (Signed)
Procedure Name: Intubation Date/Time: 12/14/2018 1:10 PM Performed by: Nelda Marseille, CRNA Pre-anesthesia Checklist: Patient identified, Patient being monitored, Timeout performed, Emergency Drugs available and Suction available Patient Re-evaluated:Patient Re-evaluated prior to induction Oxygen Delivery Method: Circle system utilized Preoxygenation: Pre-oxygenation with 100% oxygen Induction Type: IV induction Ventilation: Mask ventilation without difficulty Laryngoscope Size: Mac, 3 and McGraph Grade View: Grade I Tube type: Oral Tube size: 7.0 mm Number of attempts: 1 Airway Equipment and Method: Stylet Placement Confirmation: ETT inserted through vocal cords under direct vision,  positive ETCO2 and breath sounds checked- equal and bilateral Secured at: 22 cm Tube secured with: Tape Dental Injury: Teeth and Oropharynx as per pre-operative assessment

## 2018-12-14 NOTE — Transfer of Care (Signed)
Immediate Anesthesia Transfer of Care Note  Patient: Robin Chan  Procedure(s) Performed: EXPLORATORY LAPAROTOMY (N/A )  Patient Location: PACU  Anesthesia Type:General  Level of Consciousness: sedated  Airway & Oxygen Therapy: Patient Spontanous Breathing and Patient connected to face mask oxygen  Post-op Assessment: Report given to RN and Post -op Vital signs reviewed and stable  Post vital signs: Reviewed and stable  Last Vitals:  Vitals Value Taken Time  BP 123/75 12/14/2018  2:11 PM  Temp    Pulse 78 12/14/2018  2:15 PM  Resp 17 12/14/2018  2:15 PM  SpO2 100 % 12/14/2018  2:15 PM  Vitals shown include unvalidated device data.  Last Pain:  Vitals:   12/14/18 1134  TempSrc: Temporal  PainSc: 4       Patients Stated Pain Goal: 0 (87/27/61 8485)  Complications: No apparent anesthesia complications

## 2018-12-14 NOTE — Progress Notes (Signed)
[place] Hospital Day(s): 3.   Post op day(s):  Marland Kitchen   Interval History: Patient seen and examined, no acute events or new complaints overnight. Patient reports she is still having abdominal discomfort and a sore throat from the NGT. No fever, chills, nausea, or emesis. She denied any flatus but report "gurgling. NGT with 2.7L of output in last 24 hours although patient did undergo gastrografin challenge yesterday. No mobilizing.   Review of Systems:  Constitutional: denies fever, chills  Gastrointestinal: + abdominal pain, denies N/V, or diarrhea/and bowel function as per interval history Genitourinary: denies burning with urination or urinary frequency   Vital signs in last 24 hours: [min-max] current  Temp:  [98.1 F (36.7 C)-98.6 F (37 C)] 98.3 F (36.8 C) (05/21 0441) Pulse Rate:  [64-71] 64 (05/21 0441) Resp:  [14-19] 19 (05/21 0441) BP: (129-135)/(78-86) 131/78 (05/21 0441) SpO2:  [96 %-100 %] 96 % (05/21 0441)     Height: 5\' 9"  (175.3 cm) Weight: 86.2 kg BMI (Calculated): 28.05   Intake/Output this shift:  Total I/O In: -  Out: 200 [Emesis/NG output:200]    Physical Exam:  Constitutional: alert, cooperative and no distress  HENT: normocephalic without obvious abnormality, NGT in place Eyes: PERRL, EOM's grossly intact and symmetric  Respiratory: breathing non-labored at rest  Cardiovascular: regular rate and sinus rhythm  Gastrointestinal: soft,diffuse tenderness, and non-distended, no rebound, no guarding Musculoskeletal: no edema or wounds, motor and sensation grossly intact, NT    Labs:  CBC Latest Ref Rng & Units 12/12/2018 12/11/2018 04/26/2018  WBC 4.0 - 10.5 K/uL 11.1(H) 11.5(H) 7.0  Hemoglobin 12.0 - 15.0 g/dL 12.7 13.4 12.5  Hematocrit 36.0 - 46.0 % 38.9 40.0 38.2  Platelets 150 - 400 K/uL 409(H) 409(H) 452(H)   CMP Latest Ref Rng & Units 12/14/2018 12/13/2018 12/12/2018  Glucose 70 - 99 mg/dL 115(H) 114(H)  127(H)  BUN 6 - 20 mg/dL 17 15 15   Creatinine 0.44 - 1.00 mg/dL 0.94 0.86 0.95  Sodium 135 - 145 mmol/L 141 137 141  Potassium 3.5 - 5.1 mmol/L 3.4(L) 3.3(L) 3.4(L)  Chloride 98 - 111 mmol/L 102 101 103  CO2 22 - 32 mmol/L 29 28 27   Calcium 8.9 - 10.3 mg/dL 8.9 8.7(L) 9.2  Total Protein 6.5 - 8.1 g/dL - - -  Total Bilirubin 0.3 - 1.2 mg/dL - - -  Alkaline Phos 38 - 126 U/L - - -  AST 15 - 41 U/L - - -  ALT 0 - 44 U/L - - -     Imaging studies:   KUB - Gastrografin Challenge (12/12/2018) personally reviewed and radiologist report reviewed:  IMPRESSION: Oral contrast within multiple dilated loops of small bowel, 8 hours after administration.   Assessment/Plan: (ICD-10's: K43.51) 48 y.o. female with persistent abdominal discomfort with no significant signs of bowel function return as well as gastrografin still in dilated small bowel loops without contrast reaching the colon likely attributable to post-surgical adhesions followingLAVH in 9379, complicated by pertinent comorbidities includingADHD, anxiety/depression, GERD, multiple sclerosis, and obesity   - NPO + IVF (added K+ replacement today)  - Continue NGT to LIS  - pain control prn  - monitor abdominal examination; on-going bowel function   - Monitor electrolytes  - Will plan on exploratory laparotomy this afternoon with Dr Hampton Abbot given OR/Anesthesia availability.    - All risks, benefits, and alternatives to above procedure(s) were discussed with the patient (with Dr Hampton Abbot at bedside), all of her  questions were answered to her expressed satisfaction, patient expresses she wishes to proceed, and informed consent was obtained.  - Medical management of comorbidities  - DVT prophylaxis   All of the above findings and recommendations were discussed with the patient, and the medical team, and all of patient's questions were answered to her expressed satisfaction.  -- Edison Simon, PA-C [facility] 12/14/2018, 7:25 AM [phone] M-F: 7am - 4pm

## 2018-12-14 NOTE — Progress Notes (Signed)
Patient complaining of sore throat, post throat spray.  Also complains of "hurting all over, even my toes hurt".  Complains of feeling "whiny", because she isn't getting her ADD medications, but understands why.  Encouraged her to try the Cepacol lozenges; NGT with large output. Barbaraann Faster, RN 4:28 AM 12/14/2018

## 2018-12-14 NOTE — Anesthesia Post-op Follow-up Note (Signed)
Anesthesia QCDR form completed.        

## 2018-12-14 NOTE — Anesthesia Preprocedure Evaluation (Signed)
Anesthesia Evaluation  Patient identified by MRN, date of birth, ID band Patient awake    Reviewed: Allergy & Precautions, NPO status , Patient's Chart, lab work & pertinent test results  History of Anesthesia Complications Negative for: history of anesthetic complications  Airway Mallampati: II       Dental  (+) Teeth Intact   Pulmonary asthma (no inhalers x 1 yr) ,    Pulmonary exam normal        Cardiovascular negative cardio ROS Normal cardiovascular exam     Neuro/Psych  Headaches, PSYCHIATRIC DISORDERS Anxiety Depression  Neuromuscular disease    GI/Hepatic Neg liver ROS, GERD (hx in past)  ,  Endo/Other  negative endocrine ROS  Renal/GU negative Renal ROS  negative genitourinary   Musculoskeletal   Abdominal   Peds negative pediatric ROS (+)  Hematology negative hematology ROS (+)   Anesthesia Other Findings Past Medical History: No date: ADHD (attention deficit hyperactivity disorder) No date: Allergy No date: Anxiety No date: Chronic pelvic pain in female No date: Complex ovarian cyst No date: Depression No date: Dyspareunia No date: Endometriosis No date: Fibroid No date: GERD (gastroesophageal reflux disease) No date: Heavy period No date: MS (multiple sclerosis) (HCC) No date: Painful menstruation No date: Rectocele  Reproductive/Obstetrics                             Anesthesia Physical  Anesthesia Plan  ASA: II  Anesthesia Plan: General   Post-op Pain Management:    Induction: Intravenous  PONV Risk Score and Plan:   Airway Management Planned: Oral ETT  Additional Equipment:   Intra-op Plan:   Post-operative Plan: Extubation in OR  Informed Consent: I have reviewed the patients History and Physical, chart, labs and discussed the procedure including the risks, benefits and alternatives for the proposed anesthesia with the patient or authorized  representative who has indicated his/her understanding and acceptance.     Dental advisory given  Plan Discussed with: CRNA and Surgeon  Anesthesia Plan Comments:         Anesthesia Quick Evaluation

## 2018-12-14 NOTE — Op Note (Signed)
  Procedure Date:  12/14/2018  Pre-operative Diagnosis:  Small bowel obstruction  Post-operative Diagnosis:  Small bowel obstruction  Procedure:  Exploratory Laparotomy and lysis of adhesion band  Surgeon:  Melvyn Neth, MD  Assistant:  Nestor Lewandowsky, MD.  His assistance was needed due to the complexity of the procedure and possibility for bowel resection and anastomosis.  Anesthesia:  General endotracheal  Estimated Blood Loss:  10 ml  Specimens:  None  Complications:  None  Indications for Procedure:  This is a 48 y.o. female who presents with small bowel obstruction, not improving despite of conservative management with NPO, NG tube to suction, and gastrograffin challenge.  The risks of bleeding, abscess or infection, injury to surrounding structures, and need for further procedures were all discussed with the patient and was willing to proceed.  Description of Procedure: The patient was correctly identified in the preoperative area and brought into the operating room.  The patient was placed supine with VTE prophylaxis in place.  Appropriate time-outs were performed.  Anesthesia was induced and the patient was intubated.  Foley catheter was placed.  Appropriate antibiotics were infused.  The abdomen was prepped and draped in a sterile fashion.  A midline incision was made and electrocautery was used to dissect down the subcutaneous tissue to the fascia.  The fascia was incised and extended superiorly and inferiorly.  The abdomen was explored revealing a mid small bowel obstruction, with a single adhesive band causing kinking of the small bowel.  There was ascites in the abdomen, but no evidence of ischemia or necrosis.  The band was cut with cautery and the small bowel was freed.  The small bowel was run from Ligament of Treitz to cecum without any other adhesions noted.  The colon was also inspected and was normal.  The abdomen was thoroughly irrigated.  60 ml of Exparel solution  combined with 0.25% Marcaine with epi was infiltrated into the peritoneum, fascia, and subcutaneous tissue.  The fascia was closed #1 PDS sutures.  The midline wound was irrigated and closed using staples.  The wound was dressed with Honeycomb dressing.  Foley catheter was removed.  The patient was emerged from anesthesia and extubated and brought to the recovery room for further management.  The patient tolerated the procedure well and all counts were correct at the end of the case.   Melvyn Neth, MD

## 2018-12-15 ENCOUNTER — Encounter: Payer: Self-pay | Admitting: Surgery

## 2018-12-15 ENCOUNTER — Encounter: Payer: Self-pay | Admitting: Family Medicine

## 2018-12-15 LAB — BASIC METABOLIC PANEL
Anion gap: 9 (ref 5–15)
BUN: 19 mg/dL (ref 6–20)
CO2: 27 mmol/L (ref 22–32)
Calcium: 8.1 mg/dL — ABNORMAL LOW (ref 8.9–10.3)
Chloride: 104 mmol/L (ref 98–111)
Creatinine, Ser: 1.04 mg/dL — ABNORMAL HIGH (ref 0.44–1.00)
GFR calc Af Amer: >60 mL/min (ref >60)
GFR calc non Af Amer: >60 mL/min (ref >60)
Glucose, Bld: 104 mg/dL — ABNORMAL HIGH (ref 70–99)
Potassium: 3.7 mmol/L (ref 3.5–5.1)
Sodium: 140 mmol/L (ref 135–145)

## 2018-12-15 LAB — MAGNESIUM: Magnesium: 1.6 mg/dL — ABNORMAL LOW (ref 1.7–2.4)

## 2018-12-15 MED ORDER — DEXTROSE IN LACTATED RINGERS 5 % IV SOLN
INTRAVENOUS | Status: DC
  Administered 2018-12-15 – 2018-12-16 (×3): via INTRAVENOUS

## 2018-12-15 MED ORDER — ACETAMINOPHEN 10 MG/ML IV SOLN
1000.0000 mg | Freq: Four times a day (QID) | INTRAVENOUS | Status: AC
  Administered 2018-12-15 – 2018-12-16 (×4): 1000 mg via INTRAVENOUS
  Filled 2018-12-15 (×4): qty 100

## 2018-12-15 MED ORDER — QUETIAPINE FUMARATE 25 MG PO TABS
25.0000 mg | ORAL_TABLET | Freq: Every day | ORAL | Status: DC
  Administered 2018-12-15 – 2018-12-16 (×2): 25 mg via ORAL
  Filled 2018-12-15 (×2): qty 1

## 2018-12-15 NOTE — Progress Notes (Signed)
Patient's NGT was clamped until 1500. Hooked to suction and 100 residual removed. NGT removed per order. Patient tolerated procedure well. NAD or complaints at this time.

## 2018-12-15 NOTE — Progress Notes (Signed)
[place] Hospital Day(s): 4.   Post op day(s): 1 Day Post-Op.   Interval History: Patient seen and examined, no acute events or new complaints overnight. Patient reports that she has some abdominal soreness which is different from her pain previously during this hospitalization. No fever, chills, nausea, or emesis. She does complain of a worsening sore throat. She does not feel the spray is helping. She does endorse using more ice cubes to help with the pain. NGT with 800 ccs in last 24 hours per chart but nothing recording overnight. She has been walking around the unit since surgery. She believes she has passed flatus 4 times since surgery.   Review of Systems:  Constitutional: denies fever, chills  HEENT: + Sore throat Gastrointestinal: + abdominal pain, denies N/V, or diarrhea/and bowel function as per interval history Integumentary: + surgical incision   Vital signs in last 24 hours: [min-max] current  Temp:  [96.8 F (36 C)-99 F (37.2 C)] 99 F (37.2 C) (05/22 0422) Pulse Rate:  [63-81] 74 (05/22 0422) Resp:  [8-18] 17 (05/22 0422) BP: (110-129)/(68-84) 123/73 (05/22 0422) SpO2:  [93 %-100 %] 100 % (05/22 0422)     Height: 5\' 9"  (175.3 cm) Weight: 86.2 kg BMI (Calculated): 28.05   Intake/Output this shift:  No intake/output data recorded.    Physical Exam:  Constitutional: alert, cooperative and no distress HEENT: NGT in place  Respiratory: breathing non-labored at rest  Cardiovascular: regular rate and sinus rhythm  Gastrointestinal: soft, incisional tenderness, and non-distended Integumentary: Laparotomy incision CDI with staples and honeycomb, some drainage medially, no erythema    Labs:  CBC Latest Ref Rng & Units 12/12/2018 12/11/2018 04/26/2018  WBC 4.0 - 10.5 K/uL 11.1(H) 11.5(H) 7.0  Hemoglobin 12.0 - 15.0 g/dL 12.7 13.4 12.5  Hematocrit 36.0 - 46.0 % 38.9 40.0 38.2  Platelets 150 - 400 K/uL 409(H) 409(H) 452(H)    CMP Latest Ref Rng & Units 12/15/2018 12/14/2018 12/13/2018  Glucose 70 - 99 mg/dL 104(H) 115(H) 114(H)  BUN 6 - 20 mg/dL 19 17 15   Creatinine 0.44 - 1.00 mg/dL 1.04(H) 0.94 0.86  Sodium 135 - 145 mmol/L 140 141 137  Potassium 3.5 - 5.1 mmol/L 3.7 3.4(L) 3.3(L)  Chloride 98 - 111 mmol/L 104 102 101  CO2 22 - 32 mmol/L 27 29 28   Calcium 8.9 - 10.3 mg/dL 8.1(L) 8.9 8.7(L)  Total Protein 6.5 - 8.1 g/dL - - -  Total Bilirubin 0.3 - 1.2 mg/dL - - -  Alkaline Phos 38 - 126 U/L - - -  AST 15 - 41 U/L - - -  ALT 0 - 44 U/L - - -     Imaging studies: No new pertinent imaging studies   Assessment/Plan: (ICD-10's: K1.51) 48 y.o. female with incisional abdominal pain and some evidence of return of bowel function as well as resolved hypokalemia 1 Day Post-Op s/p exploratory laparotomy and lysis of single adhesion which was causing small bowel obstruction   - Will do clamping trial of NGT this morning, check residual after 6 hours, if less than 150-200 ccs consider removing and initiating diet   - pain control prn; added IV tylenol to aid in pain management   - monitor abdominal examination; on-going bowel function  - If NGT not removed today, will likely get KUB in the AM   - Mobilization encourage  - Medical management of comorbidities; restart home medications when tolerating PO   - DVT prophylaxis  All  of the above findings and recommendations were discussed with the patient, and the medical team, and all of patient's questions were answered to her expressed satisfaction.  -- Edison Simon, PA-C [facility] 12/15/2018, 8:04 AM [phone] M-F: 7am - 4pm

## 2018-12-16 LAB — CBC
HCT: 30.5 % — ABNORMAL LOW (ref 36.0–46.0)
Hemoglobin: 9.9 g/dL — ABNORMAL LOW (ref 12.0–15.0)
MCH: 31.1 pg (ref 26.0–34.0)
MCHC: 32.5 g/dL (ref 30.0–36.0)
MCV: 95.9 fL (ref 80.0–100.0)
Platelets: 329 10*3/uL (ref 150–400)
RBC: 3.18 MIL/uL — ABNORMAL LOW (ref 3.87–5.11)
RDW: 12 % (ref 11.5–15.5)
WBC: 8.5 10*3/uL (ref 4.0–10.5)
nRBC: 0 % (ref 0.0–0.2)

## 2018-12-16 LAB — BASIC METABOLIC PANEL
Anion gap: 7 (ref 5–15)
BUN: 15 mg/dL (ref 6–20)
CO2: 26 mmol/L (ref 22–32)
Calcium: 8 mg/dL — ABNORMAL LOW (ref 8.9–10.3)
Chloride: 107 mmol/L (ref 98–111)
Creatinine, Ser: 0.83 mg/dL (ref 0.44–1.00)
GFR calc Af Amer: >60 mL/min (ref >60)
GFR calc non Af Amer: >60 mL/min (ref >60)
Glucose, Bld: 99 mg/dL (ref 70–99)
Potassium: 3.1 mmol/L — ABNORMAL LOW (ref 3.5–5.1)
Sodium: 140 mmol/L (ref 135–145)

## 2018-12-16 LAB — MAGNESIUM: Magnesium: 1.6 mg/dL — ABNORMAL LOW (ref 1.7–2.4)

## 2018-12-16 MED ORDER — KETOROLAC TROMETHAMINE 30 MG/ML IJ SOLN
30.0000 mg | Freq: Four times a day (QID) | INTRAMUSCULAR | Status: DC
  Administered 2018-12-16 – 2018-12-17 (×4): 30 mg via INTRAVENOUS
  Filled 2018-12-16 (×3): qty 1

## 2018-12-16 MED ORDER — MAGNESIUM SULFATE 2 GM/50ML IV SOLN
2.0000 g | Freq: Once | INTRAVENOUS | Status: AC
  Administered 2018-12-16: 16:00:00 2 g via INTRAVENOUS

## 2018-12-16 MED ORDER — OXYCODONE-ACETAMINOPHEN 5-325 MG PO TABS
1.0000 | ORAL_TABLET | ORAL | Status: DC | PRN
  Administered 2018-12-16: 23:00:00 1 via ORAL
  Filled 2018-12-16: qty 1

## 2018-12-16 MED ORDER — MAGNESIUM SULFATE 4 GM/100ML IV SOLN
4.0000 g | Freq: Once | INTRAVENOUS | Status: DC
  Filled 2018-12-16: qty 100

## 2018-12-16 MED ORDER — POTASSIUM CHLORIDE 10 MEQ/100ML IV SOLN
10.0000 meq | INTRAVENOUS | Status: AC
  Administered 2018-12-16 (×4): 10 meq via INTRAVENOUS
  Filled 2018-12-16 (×7): qty 100

## 2018-12-16 NOTE — Progress Notes (Addendum)
Pharmacy Electrolyte Monitoring Consult:  Pharmacy consulted to assist in monitoring and replacing electrolytes in this 48 y.o. female admitted on 12/11/2018 with Abdominal Pain  Patient is currently day 2 s/p exploratory laparotomy and lysis of adhesion.   Labs:  Sodium (mmol/L)  Date Value  12/16/2018 140  04/26/2018 139   Potassium (mmol/L)  Date Value  12/16/2018 3.1 (L)   Magnesium (mg/dL)  Date Value  12/16/2018 1.6 (L)   Calcium (mg/dL)  Date Value  12/16/2018 8.0 (L)   Albumin (g/dL)  Date Value  12/11/2018 4.4  04/26/2018 4.3    Assessment/Plan: Patient ordered potassium 45mEq Q 1hr x 4 doses.   Will order magnesium 2g IV x 1.   Will replace to maintain electrolytes within normal limits.   Will check BMP/Magnesium with am labs.   Pharmacy will continue to monitor and adjust per consult.   Simpson,Michael L 12/16/2018 11:26 AM

## 2018-12-16 NOTE — Progress Notes (Signed)
CC: s/p laparotomy Subjective: Tolerating NGT clamping and clears Feeling better, + flatus ambulated  Objective: Vital signs in last 24 hours: Temp:  [97.7 F (36.5 C)-98.4 F (36.9 C)] 98.2 F (36.8 C) (05/23 1153) Pulse Rate:  [55-80] 80 (05/23 1153) Resp:  [17-20] 17 (05/23 1153) BP: (109-124)/(65-82) 109/74 (05/23 1153) SpO2:  [95 %-100 %] 98 % (05/23 1153) Last BM Date: 12/15/18  Intake/Output from previous day: 05/22 0701 - 05/23 0700 In: 1681.1 [P.O.:780; I.V.:901.1] Out: -  Intake/Output this shift: Total I/O In: 1020 [P.O.:1020] Out: -   Physical exam: NAD Abd: incision c/d/i, no infection. Staples in place. Appropriate incisional tenderness  Lab Results: CBC  Recent Labs    12/16/18 0611  WBC 8.5  HGB 9.9*  HCT 30.5*  PLT 329   BMET Recent Labs    12/15/18 0708 12/16/18 0611  NA 140 140  K 3.7 3.1*  CL 104 107  CO2 27 26  GLUCOSE 104* 99  BUN 19 15  CREATININE 1.04* 0.83  CALCIUM 8.1* 8.0*   PT/INR No results for input(s): LABPROT, INR in the last 72 hours. ABG No results for input(s): PHART, HCO3 in the last 72 hours.  Invalid input(s): PCO2, PO2  Studies/Results: No results found.  Anti-infectives: Anti-infectives (From admission, onward)   Start     Dose/Rate Route Frequency Ordered Stop   12/14/18 0915  clindamycin (CLEOCIN) IVPB 900 mg     900 mg 100 mL/hr over 30 Minutes Intravenous On call to O.R. 12/14/18 0857 12/14/18 1309      Assessment/Plan:  POD # 2  Doing well Advance diet Mobilize Replace lytes DC home 24-48 hrs  Caroleen Hamman, MD, [facility]  12/16/2018

## 2018-12-17 LAB — MAGNESIUM: Magnesium: 2.1 mg/dL (ref 1.7–2.4)

## 2018-12-17 LAB — BASIC METABOLIC PANEL
Anion gap: 8 (ref 5–15)
BUN: 9 mg/dL (ref 6–20)
CO2: 27 mmol/L (ref 22–32)
Calcium: 8.5 mg/dL — ABNORMAL LOW (ref 8.9–10.3)
Chloride: 106 mmol/L (ref 98–111)
Creatinine, Ser: 0.92 mg/dL (ref 0.44–1.00)
GFR calc Af Amer: >60 mL/min (ref >60)
GFR calc non Af Amer: >60 mL/min (ref >60)
Glucose, Bld: 84 mg/dL (ref 70–99)
Potassium: 3.7 mmol/L (ref 3.5–5.1)
Sodium: 141 mmol/L (ref 135–145)

## 2018-12-17 MED ORDER — HYDROCODONE-ACETAMINOPHEN 5-325 MG PO TABS: 1.0000 | ORAL_TABLET | ORAL | 0 refills | Status: DC | PRN

## 2018-12-17 NOTE — Progress Notes (Signed)
Robin Chan to be D/C'd home per MD order.  Discussed prescriptions and follow up appointments with the patient. Prescriptions given to patient, medication list explained in detail. Pt verbalized understanding.  Allergies as of 12/17/2018      Reactions   Penicillins Anaphylaxis   Did it involve swelling of the face/tongue/throat, SOB, or low BP? Yes Did it involve sudden or severe rash/hives, skin peeling, or any reaction on the inside of your mouth or nose? No Did you need to seek medical attention at a hospital or doctor's office? Yes When did it last happen?A long time ago If all above answers are "NO", may proceed with cephalosporin use.   Bee Venom    Bee      Medication List    TAKE these medications   albuterol 108 (90 Base) MCG/ACT inhaler Commonly known as:  VENTOLIN HFA Inhale 1 puff into the lungs every 6 (six) hours as needed for wheezing or shortness of breath. Reported on 09/26/2015   amphetamine-dextroamphetamine 10 MG tablet Commonly known as:  Adderall Take 1 tablet (10 mg total) by mouth daily as needed. On weekends when you sleep in What changed:    when to take this  additional instructions   amphetamine-dextroamphetamine 20 MG 24 hr capsule Commonly known as:  ADDERALL XR Take 1 capsule (20 mg total) by mouth every morning. What changed:  additional instructions   DULoxetine 30 MG capsule Commonly known as:  CYMBALTA Take 3 capsules (90 mg total) by mouth daily. [facility] psychiatrist What changed:    when to take this  additional instructions   EPINEPHrine 0.3 mg/0.3 mL Soaj injection Commonly known as:  EPI-PEN Inject 0.3 mg into the muscle as needed for anaphylaxis.   fluticasone 50 MCG/ACT nasal spray Commonly known as:  FLONASE Place 2 sprays into both nostrils daily.   HYDROcodone-acetaminophen 5-325 MG tablet Commonly known as:  NORCO/VICODIN Take 1-2 tablets by mouth every 4 (four) hours as needed for moderate pain.   levocetirizine 5  MG tablet Commonly known as:  XYZAL Take 1 tablet (5 mg total) by mouth every evening. What changed:  when to take this   meloxicam 7.5 MG tablet Commonly known as:  MOBIC TAKE 1 TABLET BY MOUTH  DAILY What changed:    when to take this  reasons to take this   montelukast 10 MG tablet Commonly known as:  SINGULAIR Take 1 tablet (10 mg total) by mouth at bedtime.   polyethylene glycol powder 17 GM/SCOOP powder Commonly known as:  GLYCOLAX/MIRALAX Take 17 g by mouth 2 (two) times daily as needed. What changed:  reasons to take this   QUEtiapine 25 MG tablet Commonly known as:  SEROQUEL Take 1 tablet (25 mg total) by mouth at bedtime.   ZyrTEC Allergy 10 MG tablet Generic drug:  cetirizine Take 10 mg by mouth at bedtime.       Vitals:   12/16/18 2058 12/17/18 0527  BP: 128/73 122/76  Pulse: 71 66  Resp: 20 20  Temp: 98.4 F (36.9 C) 98.2 F (36.8 C)  SpO2: 100% 97%    Skin clean, dry and intact without evidence of skin break down, no evidence of skin tears noted. IV catheter discontinued intact. Site without signs and symptoms of complications. Dressing and pressure applied. Pt denies pain at this time. No complaints noted.  An After Visit Summary was printed and given to the patient. Patient escorted via [place], and D/C home via private auto.  [employer]  [place] RN [employer]

## 2018-12-17 NOTE — Discharge Summary (Signed)
Patient ID: Robin Chan MRN: [id number] DOB/AGE: Jun 11, 1971 48 y.o.  Admit date: 12/11/2018 Discharge date: 12/17/2018   Discharge Diagnoses:  Active Problems:   Partial small bowel obstruction (HCC)   Procedures: Laparotomy with lysis of adhesions  Hospital Course: This is a 48 year old female scented with small bowel obstruction that was initially treated conservatively with an NG tube and n.p.o. however she failed medical management and require surgical intervention.  She was taken for an exploratory laparotomy and lysis of adhesions.  He did very well postoperatively and he her NG tube was clamped and she was slowly advanced from a clear liquid diet to a regular diet.  At the time of discharge she was ambulating, tolerating regular diet and having flatus.  Her vital signs were stable and she was afebrile.  Her physical exam revealed a female in no acute distress.  Awake and alert.  Abdomen: Soft staples in place without evidence of infection.  No peritonitis.  Extremities well-perfused without edema.  Condition of the patient at the time of discharge was stable    Disposition: Discharge disposition: 01-Home or Self Care       Discharge Instructions    Call MD for:  difficulty breathing, headache or visual disturbances   Complete by:  As directed    Call MD for:  extreme fatigue   Complete by:  As directed    Call MD for:  hives   Complete by:  As directed    Call MD for:  persistant dizziness or light-headedness   Complete by:  As directed    Call MD for:  persistant nausea and vomiting   Complete by:  As directed    Call MD for:  redness, tenderness, or signs of infection (pain, swelling, redness, odor or green/yellow discharge around incision site)   Complete by:  As directed    Call MD for:  severe uncontrolled pain   Complete by:  As directed    Call MD for:  temperature >100.4   Complete by:  As directed    Diet - low sodium heart healthy   Complete by:  As  directed    Discharge instructions   Complete by:  As directed    Shower daily   Increase activity slowly   Complete by:  As directed    Lifting restrictions   Complete by:  As directed    20 lbs x 2 wks     Allergies as of 12/17/2018      Reactions   Penicillins Anaphylaxis   Did it involve swelling of the face/tongue/throat, SOB, or low BP? Yes Did it involve sudden or severe rash/hives, skin peeling, or any reaction on the inside of your mouth or nose? No Did you need to seek medical attention at a hospital or doctor's office? Yes When did it last happen?A long time ago If all above answers are "NO", may proceed with cephalosporin use.   Bee Venom    Bee      Medication List    TAKE these medications   albuterol 108 (90 Base) MCG/ACT inhaler Commonly known as:  VENTOLIN HFA Inhale 1 puff into the lungs every 6 (six) hours as needed for wheezing or shortness of breath. Reported on 09/26/2015   amphetamine-dextroamphetamine 10 MG tablet Commonly known as:  Adderall Take 1 tablet (10 mg total) by mouth daily as needed. On weekends when you sleep in What changed:    when to take this  additional instructions  amphetamine-dextroamphetamine 20 MG 24 hr capsule Commonly known as:  ADDERALL XR Take 1 capsule (20 mg total) by mouth every morning. What changed:  additional instructions   DULoxetine 30 MG capsule Commonly known as:  CYMBALTA Take 3 capsules (90 mg total) by mouth daily. [facility] psychiatrist What changed:    when to take this  additional instructions   EPINEPHrine 0.3 mg/0.3 mL Soaj injection Commonly known as:  EPI-PEN Inject 0.3 mg into the muscle as needed for anaphylaxis.   fluticasone 50 MCG/ACT nasal spray Commonly known as:  FLONASE Place 2 sprays into both nostrils daily.   HYDROcodone-acetaminophen 5-325 MG tablet Commonly known as:  NORCO/VICODIN Take 1-2 tablets by mouth every 4 (four) hours as needed for moderate pain.   levocetirizine  5 MG tablet Commonly known as:  XYZAL Take 1 tablet (5 mg total) by mouth every evening. What changed:  when to take this   meloxicam 7.5 MG tablet Commonly known as:  MOBIC TAKE 1 TABLET BY MOUTH  DAILY What changed:    when to take this  reasons to take this   montelukast 10 MG tablet Commonly known as:  SINGULAIR Take 1 tablet (10 mg total) by mouth at bedtime.   polyethylene glycol powder 17 GM/SCOOP powder Commonly known as:  GLYCOLAX/MIRALAX Take 17 g by mouth 2 (two) times daily as needed. What changed:  reasons to take this   QUEtiapine 25 MG tablet Commonly known as:  SEROQUEL Take 1 tablet (25 mg total) by mouth at bedtime.   ZyrTEC Allergy 10 MG tablet Generic drug:  cetirizine Take 10 mg by mouth at bedtime.      Follow-up Information    Piscoya, Jacqulyn Bath, MD. Schedule an appointment as soon as possible for a visit in 1 week(s).   Specialty:  General Surgery Contact information: [address] [place] [address] [phone]            Caroleen Hamman, MD FACS

## 2018-12-17 NOTE — Progress Notes (Signed)
Pharmacy Electrolyte Monitoring Consult:  Pharmacy consulted to assist in monitoring and replacing electrolytes in this 48 y.o. female admitted on 12/11/2018 with Abdominal Pain  Patient is currently day 2 s/p exploratory laparotomy and lysis of adhesion.   Labs:  Sodium (mmol/L)  Date Value  12/17/2018 141  04/26/2018 139   Potassium (mmol/L)  Date Value  12/17/2018 3.7   Magnesium (mg/dL)  Date Value  12/17/2018 2.1   Calcium (mg/dL)  Date Value  12/17/2018 8.5 (L)   Albumin (g/dL)  Date Value  12/11/2018 4.4  04/26/2018 4.3    Assessment/Plan: No replacement warranted. Patient to discharge today.   Will replace to maintain electrolytes within normal limits.   Will check BMP/Magnesium with am labs.   Pharmacy will continue to monitor and adjust per consult.   Simpson,Michael L 12/17/2018 1:51 PM

## 2018-12-17 NOTE — Anesthesia Postprocedure Evaluation (Signed)
Anesthesia Post Note  Patient: Robin Chan  Procedure(s) Performed: EXPLORATORY LAPAROTOMY (N/A )  Patient location during evaluation: PACU Anesthesia Type: General Level of consciousness: awake and alert and oriented Pain management: pain level controlled Vital Signs Assessment: post-procedure vital signs reviewed and stable Respiratory status: spontaneous breathing Cardiovascular status: blood pressure returned to baseline Anesthetic complications: no     Last Vitals:  Vitals:   12/16/18 2058 12/17/18 0527  BP: 128/73 122/76  Pulse: 71 66  Resp: 20 20  Temp: 36.9 C 36.8 C  SpO2: 100% 97%    Last Pain:  Vitals:   12/17/18 0801  TempSrc:   PainSc: 0-No pain                 Anwen Cannedy

## 2018-12-22 ENCOUNTER — Encounter: Payer: Self-pay | Admitting: Surgery

## 2018-12-22 ENCOUNTER — Ambulatory Visit (INDEPENDENT_AMBULATORY_CARE_PROVIDER_SITE_OTHER): Payer: BLUE CROSS/BLUE SHIELD | Admitting: Surgery

## 2018-12-22 ENCOUNTER — Other Ambulatory Visit: Payer: Self-pay

## 2018-12-22 VITALS — BP 137/96 | HR 102 | Temp 97.9°F | Ht 69.0 in | Wt 205.0 lb

## 2018-12-22 DIAGNOSIS — Z09 Encounter for follow-up examination after completed treatment for conditions other than malignant neoplasm: Secondary | ICD-10-CM

## 2018-12-22 DIAGNOSIS — K565 Intestinal adhesions [bands], unspecified as to partial versus complete obstruction: Secondary | ICD-10-CM

## 2018-12-22 NOTE — Patient Instructions (Signed)
Return as needed.The patient is aware to call back for any questions or concerns.  

## 2018-12-24 ENCOUNTER — Encounter: Payer: Self-pay | Admitting: Surgery

## 2018-12-24 NOTE — Progress Notes (Signed)
12/24/2018  HPI: [name] is a 48 y.o. female s/p exlap and lysis of adhesions for small bowel obstruction.  Presents for follow up.  Reports doing well, without any nausea, vomiting, constipation.  Tolerating diet without any abdominal pain.  No issues with incision.  Vital signs: BP (!) 137/96   Pulse (!) 102   Temp 97.9 F (36.6 C) (Skin)   Ht 5\' 9"  (1.753 m)   Wt 205 lb (93 kg)   LMP 05/19/2015   SpO2 99%   BMI 30.27 kg/m    Physical Exam: Constitutional:  No acute distress Abdomen:  Soft, non-distended, non-tender to palpation.  Incision clean, dry, intact, with staples in place.  Staples removed and replaced with steri strips.  Assessment/Plan: This is a 48 y.o. female s/p exlap and lysis of adhesion  --Patient healing well.  Staples removed. --Reminded that she has until 6/18, no heavy or lifting restrictions, of no more than 10-15 lbs. --May follow up prn.   Melvyn Neth, [facility]

## 2019-01-29 ENCOUNTER — Ambulatory Visit (INDEPENDENT_AMBULATORY_CARE_PROVIDER_SITE_OTHER): Payer: BC Managed Care – PPO | Admitting: Family Medicine

## 2019-01-29 ENCOUNTER — Other Ambulatory Visit: Payer: Self-pay

## 2019-01-29 ENCOUNTER — Encounter: Payer: Self-pay | Admitting: Family Medicine

## 2019-01-29 VITALS — BP 120/80 | HR 95 | Temp 97.5°F | Resp 16 | Ht 69.0 in | Wt 198.0 lb

## 2019-01-29 DIAGNOSIS — G8929 Other chronic pain: Secondary | ICD-10-CM

## 2019-01-29 DIAGNOSIS — E538 Deficiency of other specified B group vitamins: Secondary | ICD-10-CM

## 2019-01-29 DIAGNOSIS — F902 Attention-deficit hyperactivity disorder, combined type: Secondary | ICD-10-CM

## 2019-01-29 DIAGNOSIS — F330 Major depressive disorder, recurrent, mild: Secondary | ICD-10-CM

## 2019-01-29 DIAGNOSIS — M5442 Lumbago with sciatica, left side: Secondary | ICD-10-CM | POA: Diagnosis not present

## 2019-01-29 DIAGNOSIS — Z13220 Encounter for screening for lipoid disorders: Secondary | ICD-10-CM

## 2019-01-29 DIAGNOSIS — J3089 Other allergic rhinitis: Secondary | ICD-10-CM | POA: Diagnosis not present

## 2019-01-29 DIAGNOSIS — J4520 Mild intermittent asthma, uncomplicated: Secondary | ICD-10-CM | POA: Diagnosis not present

## 2019-01-29 DIAGNOSIS — E559 Vitamin D deficiency, unspecified: Secondary | ICD-10-CM

## 2019-01-29 DIAGNOSIS — R7303 Prediabetes: Secondary | ICD-10-CM

## 2019-01-29 DIAGNOSIS — G35 Multiple sclerosis: Secondary | ICD-10-CM

## 2019-01-29 DIAGNOSIS — J302 Other seasonal allergic rhinitis: Secondary | ICD-10-CM

## 2019-01-29 DIAGNOSIS — D352 Benign neoplasm of pituitary gland: Secondary | ICD-10-CM

## 2019-01-29 MED ORDER — AMPHETAMINE-DEXTROAMPHET ER 20 MG PO CP24: 20.0000 mg | ORAL_CAPSULE | ORAL | 0 refills | Status: DC

## 2019-01-29 MED ORDER — DULOXETINE HCL 30 MG PO CPEP: 90.0000 mg | ORAL_CAPSULE | Freq: Every day | ORAL | 0 refills | Status: DC

## 2019-01-29 MED ORDER — LEVOCETIRIZINE DIHYDROCHLORIDE 5 MG PO TABS: 5.0000 mg | ORAL_TABLET | Freq: Every day | ORAL | 1 refills | Status: DC

## 2019-01-29 MED ORDER — MELOXICAM 7.5 MG PO TABS: 7.5000 mg | ORAL_TABLET | Freq: Every day | ORAL | 0 refills | Status: DC | PRN

## 2019-01-29 NOTE — Progress Notes (Signed)
Name: MONIC ENGELMANN   MRN: [id number]    DOB: 10/22/1970   Date:01/29/2019       Progress Note  Subjective  Chief Complaint  Chief Complaint  Patient presents with  . ADD  . Depression  . Medication Refill    HPI  ADD: taking medication daily as prescribed, on Adderal XR 20 mg now and she states she has been feeling well with medication, no side effects.She states she feels very sluggishand no energy or ability to focus without medication. She started working  March 2019 helps with her focus,she is living by herself in a house now , sometimes wakes up very late on weekendsshe cannot the XR, we gave her a rx for immediate release that she pays cash for, she still has enough for now. She had a gap at work because of COVID-19 but she is back at work now    Cecilia back pain: she was seen here with left radiculitis on 07/11 took a round of prednisone taper and symptoms improved a little, but still having daily low back pain , no longer has radiculitis.She saw Dr. Catalina Antigua at [facility], had MRI lumbar spine and was diagnosed with DDD and was given meloxicam and methocarbamol however she stopped muscle relaxer because it was causing thigh pain and fatigue. She states upper back is bothersome now. She will re-schedule a follow up  History of small bowel obstruction: had surgery done in May, she still has some abdominal discomfort when in bed, but normal bowel movements and no blood in stools.Normal appetite.   MS: diagnosed in 2000, but first symptoms in 1991 - she felt numb on the right side and weakness. Currently off medication because of multiple side effects. Has intermittent weakness and has used a cane periodically, for left foot drop - worse when she is tired and when it rains. Adderall helps with energy level. She still has left leg numbness intermittent - can last from hours to days. She started part time work at [employer] 03/19. She is able to sleep part time but not sure if she is  able to handle a full day of work, gets very tired at the end of the day   Insomnia: she has been off Ambien, we stoppedit on theSpring 2018. She is taking Seroquel low dose qhs and helps her fall asleep ( takes about 10-20 minutes ) , she wakes up having night terrors at times, but otherwise she sleeps well. Unchanged   Major Depression: she was taking Lexapro for many years and depression was not controlled, we switched to Cymbalta in June 2017, She is separated and divorce is going to be final this month. She is worried about insurance coverage .She is also seeing psychiatrist at [facility]  on high dose duloxetine 90 mg, denies side effects, Phq 9 only positive for insomnia, but chronic, she will re-schedule appointment for at least the therapist. She needs a refill today    Asthma: she is taking singulairdaily now , she denies wheezing, cough or SOB at this time She states as long as she takes allergy medications her symptoms are controlled   AR: rhinorrhea or nasal congestion controlled as long as she takes Xyzal, Zyrtec, Flonase and singulair   Pituitary microadenoma: no significant change since 2011 -last one was 2019, diagnosed a mass possible Rathke's cleft cyst,last prolactin normal No double vision, or nipple discharge Unchanged    Patient Active Problem List   Diagnosis Date Noted  . Partial small  bowel obstruction ([place]) 12/11/2018  . Pelvic pain 04/07/2017  . Medication management 04/07/2017  . Rectocele 07/08/2015  . Status post laparoscopic assisted vaginal hysterectomy ([facility]) 07/08/2015  . History of hysterectomy for benign disease 05/26/2015  . Left lumbar radiculitis 03/28/2015  . Breast cancer screening 02/05/2015  . Abnormal CAT scan 12/31/2014  . ADD (attention deficit disorder) 12/31/2014  . Allergic to bees 12/31/2014  . Back muscle spasm 12/31/2014  . Insomnia, persistent 12/31/2014  . Constipation 12/31/2014  . Depression, major, recurrent, mild ([place])  12/31/2014  . Personal history of traumatic fracture 12/31/2014  . Asthma, mild intermittent 12/31/2014  . Migraine with aura and without status migrainosus, not intractable 12/31/2014  . Multiple sclerosis, relapsing-remitting ([place]) 12/31/2014  . Endometriosis determined by laparoscopy 12/31/2014  . Perennial allergic rhinitis 12/31/2014  . Pituitary microadenoma ([place]) 12/31/2014  . Vitamin D deficiency 12/31/2014  . Acquired trigger finger 12/31/2014    Past Surgical History:  Procedure Laterality Date  . ABDOMINAL HYSTERECTOMY  05/26/2015  . BREAST BIOPSY Right 2005   benign  . LAPAROSCOPIC ASSISTED VAGINAL HYSTERECTOMY N/A 05/26/2015   Procedure: LAPAROSCOPIC ASSISTED VAGINAL HYSTERECTOMY, with right salpingectomy;  Surgeon: Brayton Mars, MD;  Location: [facility] ORS;  Service: Gynecology;  Laterality: N/A;  . LAPAROTOMY N/A 12/14/2018   Procedure: EXPLORATORY LAPAROTOMY;  Surgeon: Olean Ree, MD;  Location: [facility] ORS;  Service: General;  Laterality: N/A;  . OVARY SURGERY Left 09/30/2014  . RECTOCELE REPAIR N/A 05/26/2015   Procedure: POSTERIOR REPAIR (RECTOCELE);  Surgeon: Brayton Mars, MD;  Location: [facility] ORS;  Service: Gynecology;  Laterality: N/A;    Family History  Problem Relation Age of Onset  . Anemia Mother   . Osteoporosis Mother   . Mental illness Father        Bipolar  . Arthritis Brother   . Breast cancer Maternal Aunt     Social History   Socioeconomic History  . Marital status: Legally Separated    Spouse name: Not on file  . Number of children: 2  . Years of education: Not on file  . Highest education level: Associate degree: academic program  Occupational History  . Occupation: Proofreader     Comment: [employer]   Social Needs  . Financial resource strain: Not hard at all  . Food insecurity    Worry: Never true    Inability: Never true  . Transportation needs    Medical: No    Non-medical: No  Tobacco Use  . Smoking status:  Never Smoker  . Smokeless tobacco: Never Used  Substance and Sexual Activity  . Alcohol use: Yes    Alcohol/week: 14.0 standard drinks    Types: 14 Shots of liquor per week    Comment: occasionally, 1-2 shots every night  . Drug use: No  . Sexual activity: Yes    Partners: Male    Birth control/protection: None    Comment: separated   Lifestyle  . Physical activity    Days per week: 0 days    Minutes per session: 0 min  . Stress: Rather much  Relationships  . Social connections    Talks on phone: More than three times a week    Gets together: More than three times a week    Attends religious service: Never    Active member of club or organization: No    Attends meetings of clubs or organizations: Never    Relationship status: Married  . Intimate partner violence  Fear of current or ex partner: No    Emotionally abused: No    Physically abused: No    Forced sexual activity: No  Other Topics Concern  . Not on file  Social History Narrative   She is from [place].    Moved to [place] in 1998 because of husband's job transfer, that is when she stopped working -pending her green-card.   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    She had symptoms of MS in 2000 , daughter was 65 year old and just now found a job at [employer] March 2019   Separated since July 2019         Current Outpatient Medications:  .  albuterol (PROVENTIL HFA;VENTOLIN HFA) 108 (90 BASE) MCG/ACT inhaler, Inhale 1 puff into the lungs every 6 (six) hours as needed for wheezing or shortness of breath. Reported on 09/26/2015, Disp: , Rfl:  .  amphetamine-dextroamphetamine (ADDERALL XR) 20 MG 24 hr capsule, Take 1 capsule (20 mg total) by mouth every morning., Disp: 90 capsule, Rfl: 0 .  amphetamine-dextroamphetamine (ADDERALL) 10 MG tablet, Take 1 tablet (10 mg total) by mouth daily as needed. On weekends when you sleep in (Patient taking differently: Take 10 mg by mouth daily. On weekends), Disp: 30 tablet, Rfl: 0 .   cetirizine (ZYRTEC ALLERGY) 10 MG tablet, Take 10 mg by mouth at bedtime. , Disp: , Rfl:  .  DULoxetine (CYMBALTA) 30 MG capsule, Take 3 capsules (90 mg total) by mouth at bedtime., Disp: 270 capsule, Rfl: 0 .  EPINEPHrine 0.3 mg/0.3 mL IJ SOAJ injection, Inject 0.3 mg into the muscle as needed for anaphylaxis. , Disp: , Rfl:  .  fluticasone (FLONASE) 50 MCG/ACT nasal spray, Place 2 sprays into both nostrils daily., Disp: 48 g, Rfl: 1 .  levocetirizine (XYZAL) 5 MG tablet, Take 1 tablet (5 mg total) by mouth daily., Disp: 90 tablet, Rfl: 1 .  meloxicam (MOBIC) 7.5 MG tablet, Take 1 tablet (7.5 mg total) by mouth daily as needed for pain., Disp: 90 tablet, Rfl: 0 .  montelukast (SINGULAIR) 10 MG tablet, Take 1 tablet (10 mg total) by mouth at bedtime., Disp: 90 tablet, Rfl: 1 .  polyethylene glycol powder (GLYCOLAX/MIRALAX) powder, Take 17 g by mouth 2 (two) times daily as needed. (Patient taking differently: Take 17 g by mouth 2 (two) times daily as needed for mild constipation. ), Disp: 3350 g, Rfl: 1 .  QUEtiapine (SEROQUEL) 25 MG tablet, Take 1 tablet (25 mg total) by mouth at bedtime., Disp: 90 tablet, Rfl: 1  Allergies  Allergen Reactions  . Penicillins Anaphylaxis    Did it involve swelling of the face/tongue/throat, SOB, or low BP? Yes Did it involve sudden or severe rash/hives, skin peeling, or any reaction on the inside of your mouth or nose? No Did you need to seek medical attention at a hospital or doctor's office? Yes When did it last happen?A long time ago If all above answers are "NO", may proceed with cephalosporin use.  Raelyn Ensign Venom     Bee    I personally reviewed active problem list, medication list, allergies, family history with the patient/caregiver today.   ROS  Ten systems reviewed and is negative except as mentioned in HPI   Objective  Vitals:   01/29/19 0852  BP: 120/80  Pulse: 95  Resp: 16  Temp: (!) 97.5 F (36.4 C)  TempSrc: Oral  SpO2: 98%  Weight:  198 lb (89.8 kg)  Height:  5\' 9"  (1.753 m)    Body mass index is 29.24 kg/m.  Physical Exam  Constitutional: Patient appears well-developed and well-nourished. Overweight.  No distress.  HEENT: head atraumatic, normocephalic, pupils equal and reactive to light, neck supple, oral mucosa not done  Cardiovascular: Normal rate, regular rhythm and normal heart sounds.  No murmur heard. No BLE edema. Pulmonary/Chest: Effort normal and breath sounds normal. No respiratory distress. Abdominal: Soft.  There is no tenderness. Psychiatric: Patient has a normal mood and affect. behavior is normal. Judgment and thought content normal.  Recent Results (from the past 2160 hour(s))  Lipase, blood     Status: None   Collection Time: 12/11/18  3:03 AM  Result Value Ref Range   Lipase 22 11 - 51 U/L    Comment: Performed at [facility], [address] [place], [place] [address]  Comprehensive metabolic panel     Status: Abnormal   Collection Time: 12/11/18  3:03 AM  Result Value Ref Range   Sodium 138 135 - 145 mmol/L   Potassium 3.8 3.5 - 5.1 mmol/L   Chloride 102 98 - 111 mmol/L   CO2 25 22 - 32 mmol/L   Glucose, Bld 137 (H) 70 - 99 mg/dL   BUN 15 6 - 20 mg/dL   Creatinine, Ser 0.93 0.44 - 1.00 mg/dL   Calcium 9.1 8.9 - 10.3 mg/dL   Total Protein 7.7 6.5 - 8.1 g/dL   Albumin 4.4 3.5 - 5.0 g/dL   AST 16 15 - 41 U/L   ALT 16 0 - 44 U/L   Alkaline Phosphatase 78 38 - 126 U/L   Total Bilirubin 1.0 0.3 - 1.2 mg/dL   GFR calc non Af Amer >60 >60 mL/min   GFR calc Af Amer >60 >60 mL/min   Anion gap 11 5 - 15    Comment: Performed at [facility], [address] [place], [place] [address]  CBC     Status: Abnormal   Collection Time: 12/11/18  3:03 AM  Result Value Ref Range   WBC 11.5 (H) 4.0 - 10.5 K/uL   RBC 4.31 3.87 - 5.11 MIL/uL   Hemoglobin 13.4 12.0 - 15.0 g/dL   HCT 40.0 36.0 - 46.0 %   MCV 92.8 80.0 - 100.0 fL   MCH 31.1 26.0 - 34.0 pg   MCHC 33.5 30.0 - 36.0  g/dL   RDW 11.9 11.5 - 15.5 %   Platelets 409 (H) 150 - 400 K/uL   nRBC 0.0 0.0 - 0.2 %    Comment: Performed at [facility], [address] [place], [place] [address]  Urinalysis, Complete w Microscopic     Status: Abnormal   Collection Time: 12/11/18  3:03 AM  Result Value Ref Range   Color, Urine YELLOW (A) YELLOW   APPearance CLOUDY (A) CLEAR   Specific Gravity, Urine 1.027 1.005 - 1.030   pH 6.0 5.0 - 8.0   Glucose, UA NEGATIVE NEGATIVE mg/dL   Hgb urine dipstick NEGATIVE NEGATIVE   Bilirubin Urine NEGATIVE NEGATIVE   Ketones, ur NEGATIVE NEGATIVE mg/dL   Protein, ur 30 (A) NEGATIVE mg/dL   Nitrite NEGATIVE NEGATIVE   Leukocytes,Ua NEGATIVE NEGATIVE   RBC / HPF 6-10 0 - 5 RBC/hpf   WBC, UA 0-5 0 - 5 WBC/hpf   Bacteria, UA FEW (A) NONE SEEN   Squamous Epithelial / LPF 11-20 0 - 5   Mucus PRESENT    Hyaline Casts, UA PRESENT  Comment: Performed at [facility], [address] [place], [place] [address]  Pregnancy, urine     Status: None   Collection Time: 12/11/18  3:03 AM  Result Value Ref Range   Preg Test, Ur NEGATIVE NEGATIVE    Comment: Performed at [facility], [address] [place], [place] [address]  SARS Coronavirus 2 (CEPHEID - Performed in [facility] hospital lab), Hosp Order     Status: None   Collection Time: 12/11/18  8:20 AM   Specimen: Nasopharyngeal Swab  Result Value Ref Range   SARS Coronavirus 2 NEGATIVE NEGATIVE    Comment: (NOTE) If result is NEGATIVE SARS-CoV-2 target nucleic acids are NOT DETECTED. The SARS-CoV-2 RNA is generally detectable in upper and lower  respiratory specimens during the acute phase of infection. The lowest  concentration of SARS-CoV-2 viral copies this assay can detect is 250  copies / mL. A negative result does not preclude SARS-CoV-2 infection  and should not be used as the sole basis for treatment or other  patient management decisions.  A negative result may occur with  improper  specimen collection / handling, submission of specimen other  than nasopharyngeal swab, presence of viral mutation(s) within the  areas targeted by this assay, and inadequate number of viral copies  (<250 copies / mL). A negative result must be combined with clinical  observations, patient history, and epidemiological information. If result is POSITIVE SARS-CoV-2 target nucleic acids are DETECTED. The SARS-CoV-2 RNA is generally detectable in upper and lower  respiratory specimens dur ing the acute phase of infection.  Positive  results are indicative of active infection with SARS-CoV-2.  Clinical  correlation with patient history and other diagnostic information is  necessary to determine patient infection status.  Positive results do  not rule out bacterial infection or co-infection with other viruses. If result is PRESUMPTIVE POSTIVE SARS-CoV-2 nucleic acids MAY BE PRESENT.   A presumptive positive result was obtained on the submitted specimen  and confirmed on repeat testing.  While 2019 novel coronavirus  (SARS-CoV-2) nucleic acids may be present in the submitted sample  additional confirmatory testing may be necessary for epidemiological  and / or clinical management purposes  to differentiate between  SARS-CoV-2 and other Sarbecovirus currently known to infect humans.  If clinically indicated additional testing with an alternate test  methodology [phone]) is advised. The SARS-CoV-2 RNA is generally  detectable in upper and lower respiratory sp ecimens during the acute  phase of infection. The expected result is Negative. Fact Sheet for Patients:  StrictlyIdeas.no Fact Sheet for Healthcare Providers: BankingDealers.co.za This test is not yet approved or cleared by the [place] FDA and has been authorized for detection and/or diagnosis of SARS-CoV-2 by FDA under an Emergency Use Authorization (EUA).  This EUA will remain in  effect (meaning this test can be used) for the duration of the COVID-19 declaration under Section 564(b)(1) of the Act, 21 U.S.C. section 360bbb-3(b)(1), unless the authorization is terminated or revoked sooner. Performed at [facility], [address] [place], [place] [address]   Basic metabolic panel     Status: Abnormal   Collection Time: 12/12/18  2:53 AM  Result Value Ref Range   Sodium 141 135 - 145 mmol/L   Potassium 3.4 (L) 3.5 - 5.1 mmol/L   Chloride 103 98 - 111 mmol/L   CO2 27 22 - 32 mmol/L   Glucose, Bld 127 (H) 70 - 99 mg/dL   BUN 15 6 -  20 mg/dL   Creatinine, Ser 0.95 0.44 - 1.00 mg/dL   Calcium 9.2 8.9 - 10.3 mg/dL   GFR calc non Af Amer >60 >60 mL/min   GFR calc Af Amer >60 >60 mL/min   Anion gap 11 5 - 15    Comment: Performed at [facility], [address] [place], [place] [address]  CBC     Status: Abnormal   Collection Time: 12/12/18  2:53 AM  Result Value Ref Range   WBC 11.1 (H) 4.0 - 10.5 K/uL   RBC 4.14 3.87 - 5.11 MIL/uL   Hemoglobin 12.7 12.0 - 15.0 g/dL   HCT 38.9 36.0 - 46.0 %   MCV 94.0 80.0 - 100.0 fL   MCH 30.7 26.0 - 34.0 pg   MCHC 32.6 30.0 - 36.0 g/dL   RDW 12.1 11.5 - 15.5 %   Platelets 409 (H) 150 - 400 K/uL   nRBC 0.0 0.0 - 0.2 %    Comment: Performed at [facility], [address] [place], [place] [address]  Basic metabolic panel     Status: Abnormal   Collection Time: 12/13/18  8:06 AM  Result Value Ref Range   Sodium 137 135 - 145 mmol/L   Potassium 3.3 (L) 3.5 - 5.1 mmol/L   Chloride 101 98 - 111 mmol/L   CO2 28 22 - 32 mmol/L   Glucose, Bld 114 (H) 70 - 99 mg/dL   BUN 15 6 - 20 mg/dL   Creatinine, Ser 0.86 0.44 - 1.00 mg/dL   Calcium 8.7 (L) 8.9 - 10.3 mg/dL   GFR calc non Af Amer >60 >60 mL/min   GFR calc Af Amer >60 >60 mL/min   Anion gap 8 5 - 15    Comment: Performed at [facility], [address] [place], [place] [address]  Basic metabolic panel     Status: Abnormal    Collection Time: 12/14/18  4:09 AM  Result Value Ref Range   Sodium 141 135 - 145 mmol/L   Potassium 3.4 (L) 3.5 - 5.1 mmol/L   Chloride 102 98 - 111 mmol/L   CO2 29 22 - 32 mmol/L   Glucose, Bld 115 (H) 70 - 99 mg/dL   BUN 17 6 - 20 mg/dL   Creatinine, Ser 0.94 0.44 - 1.00 mg/dL   Calcium 8.9 8.9 - 10.3 mg/dL   GFR calc non Af Amer >60 >60 mL/min   GFR calc Af Amer >60 >60 mL/min   Anion gap 10 5 - 15    Comment: Performed at [facility], [address] [place], [place] [address]  Surgical pcr screen     Status: None   Collection Time: 12/14/18 11:01 AM   Specimen: Nasal Mucosa; Nasal Swab  Result Value Ref Range   MRSA, PCR NEGATIVE NEGATIVE   Staphylococcus aureus NEGATIVE NEGATIVE    Comment: (NOTE) The Xpert SA Assay (FDA approved for NASAL specimens in patients 55 years of age and older), is one component of a comprehensive surveillance program. It is not intended to diagnose infection nor to guide or monitor treatment. Performed at [facility], [address] [place], [place] [address]   Basic metabolic panel     Status: Abnormal   Collection Time: 12/15/18  7:08 AM  Result Value Ref Range   Sodium 140 135 - 145 mmol/L   Potassium 3.7 3.5 - 5.1 mmol/L   Chloride 104 98 - 111 mmol/L   CO2 27 22 -  32 mmol/L   Glucose, Bld 104 (H) 70 - 99 mg/dL   BUN 19 6 - 20 mg/dL   Creatinine, Ser 1.04 (H) 0.44 - 1.00 mg/dL   Calcium 8.1 (L) 8.9 - 10.3 mg/dL   GFR calc non Af Amer >60 >60 mL/min   GFR calc Af Amer >60 >60 mL/min   Anion gap 9 5 - 15    Comment: Performed at [facility], [address] [place], [place] [address]  Magnesium     Status: Abnormal   Collection Time: 12/15/18  7:08 AM  Result Value Ref Range   Magnesium 1.6 (L) 1.7 - 2.4 mg/dL    Comment: Performed at [facility], [address] [place], [place] [address]  CBC     Status: Abnormal   Collection Time: 12/16/18  6:11 AM  Result Value Ref Range   WBC 8.5  4.0 - 10.5 K/uL   RBC 3.18 (L) 3.87 - 5.11 MIL/uL   Hemoglobin 9.9 (L) 12.0 - 15.0 g/dL   HCT 30.5 (L) 36.0 - 46.0 %   MCV 95.9 80.0 - 100.0 fL   MCH 31.1 26.0 - 34.0 pg   MCHC 32.5 30.0 - 36.0 g/dL   RDW 12.0 11.5 - 15.5 %   Platelets 329 150 - 400 K/uL   nRBC 0.0 0.0 - 0.2 %    Comment: Performed at [facility], [address] [place], [place] [address]  Basic metabolic panel     Status: Abnormal   Collection Time: 12/16/18  6:11 AM  Result Value Ref Range   Sodium 140 135 - 145 mmol/L   Potassium 3.1 (L) 3.5 - 5.1 mmol/L   Chloride 107 98 - 111 mmol/L   CO2 26 22 - 32 mmol/L   Glucose, Bld 99 70 - 99 mg/dL   BUN 15 6 - 20 mg/dL   Creatinine, Ser 0.83 0.44 - 1.00 mg/dL   Calcium 8.0 (L) 8.9 - 10.3 mg/dL   GFR calc non Af Amer >60 >60 mL/min   GFR calc Af Amer >60 >60 mL/min   Anion gap 7 5 - 15    Comment: Performed at [facility], [address], [place], [place] [address]  Magnesium     Status: Abnormal   Collection Time: 12/16/18  6:11 AM  Result Value Ref Range   Magnesium 1.6 (L) 1.7 - 2.4 mg/dL    Comment: Performed at [facility], [address] [place], [place] [address]  Basic metabolic panel     Status: Abnormal   Collection Time: 12/17/18  4:09 AM  Result Value Ref Range   Sodium 141 135 - 145 mmol/L   Potassium 3.7 3.5 - 5.1 mmol/L   Chloride 106 98 - 111 mmol/L   CO2 27 22 - 32 mmol/L   Glucose, Bld 84 70 - 99 mg/dL   BUN 9 6 - 20 mg/dL   Creatinine, Ser 0.92 0.44 - 1.00 mg/dL   Calcium 8.5 (L) 8.9 - 10.3 mg/dL   GFR calc non Af Amer >60 >60 mL/min   GFR calc Af Amer >60 >60 mL/min   Anion gap 8 5 - 15    Comment: Performed at [facility], [address], [place],  [address]  Magnesium     Status: None   Collection Time: 12/17/18  4:09 AM  Result Value Ref Range   Magnesium 2.1 1.7 - 2.4 mg/dL    Comment: Performed at [facility],  [place], [place] [address]       PHQ2/9: Depression screen [facility] 2/9 01/29/2019 10/11/2018 07/07/2018 05/08/2018 04/07/2018  Decreased Interest 0 0 0 0 0  Down, Depressed, Hopeless 0 0 0 0 0  PHQ - 2 Score 0 0 0 0 0  Altered sleeping 1 2 0 0 1  Tired, decreased energy 1 1 1 1  0  Change in appetite 1 0 0 0 1  Feeling bad or failure about yourself  0 0 0 0 0  Trouble concentrating 0 0 0 3 0  Moving slowly or fidgety/restless 0 0 0 0 0  Suicidal thoughts 0 0 - 0 0  PHQ-9 Score 3 3 1 4 2   Difficult doing work/chores Not difficult at all Not difficult at all Not difficult at all Somewhat difficult Not difficult at all  Some recent data might be hidden    phq 9 is negative   Fall Risk: Fall Risk  01/29/2019 10/11/2018 07/11/2018 07/11/2018 07/07/2018  Falls in the past year? 0 0 0 1 1  Number falls in past yr: 0 0 - 1 1  Injury with Fall? 0 0 - 1 1  Comment - - - - -  Risk Factor Category  - - - - -  Comment - - - - -  Risk for fall due to : - - - History of fall(s) History of fall(s)  Follow up - - - - -      Assessment & Plan   1. Mild intermittent asthma without complication  Well controlled at this time  2. Chronic left-sided low back pain with left-sided sciatica  Usually taking medication prn after work  - meloxicam (MOBIC) 7.5 MG tablet; Take 1 tablet (7.5 mg total) by mouth daily as needed for pain.  Dispense: 90 tablet; Refill: 0  3. Perennial allergic rhinitis with seasonal variation  - levocetirizine (XYZAL) 5 MG tablet; Take 1 tablet (5 mg total) by mouth daily.  Dispense: 90 tablet; Refill: 1  4. Depression, major, recurrent, mild ([place])  She was going to [employer], but needs to reschedule her appointment.  - DULoxetine (CYMBALTA) 30 MG capsule; Take 3 capsules (90 mg total) by mouth at bedtime.  Dispense: 270 capsule; Refill: 0 - amphetamine-dextroamphetamine (ADDERALL XR) 20 MG 24 hr capsule; Take 1 capsule (20 mg total) by mouth every morning.  Dispense: 90 capsule;  Refill: 0  5. Multiple sclerosis, relapsing-remitting (HCC)  - amphetamine-dextroamphetamine (ADDERALL XR) 20 MG 24 hr capsule; Take 1 capsule (20 mg total) by mouth every morning.  Dispense: 90 capsule; Refill: 0  6. Attention deficit hyperactivity disorder (ADHD), combined type  - amphetamine-dextroamphetamine (ADDERALL XR) 20 MG 24 hr capsule; Take 1 capsule (20 mg total) by mouth every morning.  Dispense: 90 capsule; Refill: 0  7. Vitamin D deficiency  - VITAMIN D 25 Hydroxy (Vit-D Deficiency, Fractures)  8. B12 deficiency  - CBC with Differential/Platelet - B12 and Folate Panel  9. Pituitary microadenoma (HCC)  - Comprehensive metabolic panel - Prolactin - TSH  10. Pre-diabetes  - Hemoglobin A1c  11. Lipid screening  - Lipid panel

## 2019-02-02 DIAGNOSIS — E559 Vitamin D deficiency, unspecified: Secondary | ICD-10-CM | POA: Diagnosis not present

## 2019-02-02 DIAGNOSIS — R7303 Prediabetes: Secondary | ICD-10-CM | POA: Diagnosis not present

## 2019-02-02 DIAGNOSIS — Z13220 Encounter for screening for lipoid disorders: Secondary | ICD-10-CM | POA: Diagnosis not present

## 2019-02-02 DIAGNOSIS — G35 Multiple sclerosis: Secondary | ICD-10-CM | POA: Diagnosis not present

## 2019-02-02 DIAGNOSIS — E538 Deficiency of other specified B group vitamins: Secondary | ICD-10-CM | POA: Diagnosis not present

## 2019-02-02 DIAGNOSIS — F330 Major depressive disorder, recurrent, mild: Secondary | ICD-10-CM | POA: Diagnosis not present

## 2019-02-03 LAB — LIPID PANEL
Chol/HDL Ratio: 3.7 ratio (ref 0.0–4.4)
Cholesterol, Total: 194 mg/dL (ref 100–199)
HDL: 52 mg/dL (ref >39)
LDL Calculated: 121 mg/dL — ABNORMAL HIGH (ref 0–99)
Triglycerides: 105 mg/dL (ref 0–149)
VLDL Cholesterol Cal: 21 mg/dL (ref 5–40)

## 2019-02-03 LAB — COMPREHENSIVE METABOLIC PANEL
ALT: 11 IU/L (ref 0–32)
AST: 12 IU/L (ref 0–40)
Albumin/Globulin Ratio: 2 (ref 1.2–2.2)
Albumin: 4.4 g/dL (ref 3.8–4.8)
Alkaline Phosphatase: 78 IU/L (ref 39–117)
BUN/Creatinine Ratio: 12 (ref 9–23)
BUN: 12 mg/dL (ref 6–24)
Bilirubin Total: 0.6 mg/dL (ref 0.0–1.2)
CO2: 23 mmol/L (ref 20–29)
Calcium: 9.4 mg/dL (ref 8.7–10.2)
Chloride: 102 mmol/L (ref 96–106)
Creatinine, Ser: 1 mg/dL (ref 0.57–1.00)
GFR calc Af Amer: 78 mL/min/{1.73_m2} (ref >59)
GFR calc non Af Amer: 67 mL/min/{1.73_m2} (ref >59)
Globulin, Total: 2.2 g/dL (ref 1.5–4.5)
Glucose: 98 mg/dL (ref 65–99)
Potassium: 4.7 mmol/L (ref 3.5–5.2)
Sodium: 139 mmol/L (ref 134–144)
Total Protein: 6.6 g/dL (ref 6.0–8.5)

## 2019-02-03 LAB — CBC WITH DIFFERENTIAL/PLATELET
Basophils Absolute: 0.1 10*3/uL (ref 0.0–0.2)
Basos: 1 %
EOS (ABSOLUTE): 0.1 10*3/uL (ref 0.0–0.4)
Eos: 2 %
Hematocrit: 38.4 % (ref 34.0–46.6)
Hemoglobin: 12.8 g/dL (ref 11.1–15.9)
Immature Grans (Abs): 0 10*3/uL (ref 0.0–0.1)
Immature Granulocytes: 1 %
Lymphocytes Absolute: 2.3 10*3/uL (ref 0.7–3.1)
Lymphs: 36 %
MCH: 31.3 pg (ref 26.6–33.0)
MCHC: 33.3 g/dL (ref 31.5–35.7)
MCV: 94 fL (ref 79–97)
Monocytes Absolute: 0.5 10*3/uL (ref 0.1–0.9)
Monocytes: 8 %
Neutrophils Absolute: 3.4 10*3/uL (ref 1.4–7.0)
Neutrophils: 52 %
Platelets: 473 10*3/uL — ABNORMAL HIGH (ref 150–450)
RBC: 4.09 x10E6/uL (ref 3.77–5.28)
RDW: 12.3 % (ref 11.7–15.4)
WBC: 6.3 10*3/uL (ref 3.4–10.8)

## 2019-02-03 LAB — B12 AND FOLATE PANEL
Folate: 7.8 ng/mL (ref >3.0)
Vitamin B-12: 325 pg/mL (ref 232–1245)

## 2019-02-03 LAB — HEMOGLOBIN A1C
Est. average glucose Bld gHb Est-mCnc: 111 mg/dL
Hgb A1c MFr Bld: 5.5 % (ref 4.8–5.6)

## 2019-02-03 LAB — VITAMIN D 25 HYDROXY (VIT D DEFICIENCY, FRACTURES): Vit D, 25-Hydroxy: 24.2 ng/mL — ABNORMAL LOW (ref 30.0–100.0)

## 2019-02-03 LAB — TSH: TSH: 1.26 u[IU]/mL (ref 0.450–4.500)

## 2019-02-03 LAB — PROLACTIN: Prolactin: 13.4 ng/mL (ref 4.8–23.3)

## 2019-02-09 ENCOUNTER — Emergency Department (HOSPITAL_COMMUNITY)
Admission: EM | Admit: 2019-02-09 | Discharge: 2019-02-09 | Disposition: A | Discharge status: Home / Self Care | Payer: BC Managed Care – PPO | Attending: Emergency Medicine | Admitting: Emergency Medicine

## 2019-02-09 ENCOUNTER — Encounter (HOSPITAL_COMMUNITY): Payer: Self-pay | Admitting: Emergency Medicine

## 2019-02-09 ENCOUNTER — Emergency Department (HOSPITAL_COMMUNITY): Payer: BC Managed Care – PPO

## 2019-02-09 ENCOUNTER — Other Ambulatory Visit: Payer: Self-pay

## 2019-02-09 DIAGNOSIS — M79605 Pain in left leg: Secondary | ICD-10-CM | POA: Insufficient documentation

## 2019-02-09 DIAGNOSIS — Z79899 Other long term (current) drug therapy: Secondary | ICD-10-CM | POA: Insufficient documentation

## 2019-02-09 DIAGNOSIS — R0902 Hypoxemia: Secondary | ICD-10-CM | POA: Diagnosis not present

## 2019-02-09 DIAGNOSIS — R52 Pain, unspecified: Secondary | ICD-10-CM | POA: Diagnosis not present

## 2019-02-09 DIAGNOSIS — J45909 Unspecified asthma, uncomplicated: Secondary | ICD-10-CM | POA: Diagnosis not present

## 2019-02-09 DIAGNOSIS — S99912A Unspecified injury of left ankle, initial encounter: Secondary | ICD-10-CM | POA: Diagnosis not present

## 2019-02-09 DIAGNOSIS — R531 Weakness: Secondary | ICD-10-CM | POA: Diagnosis not present

## 2019-02-09 DIAGNOSIS — M25562 Pain in left knee: Secondary | ICD-10-CM | POA: Diagnosis not present

## 2019-02-09 DIAGNOSIS — M79672 Pain in left foot: Secondary | ICD-10-CM | POA: Diagnosis not present

## 2019-02-09 DIAGNOSIS — S8992XA Unspecified injury of left lower leg, initial encounter: Secondary | ICD-10-CM | POA: Diagnosis not present

## 2019-02-09 DIAGNOSIS — S99922A Unspecified injury of left foot, initial encounter: Secondary | ICD-10-CM | POA: Diagnosis not present

## 2019-02-09 DIAGNOSIS — M545 Low back pain: Secondary | ICD-10-CM | POA: Insufficient documentation

## 2019-02-09 DIAGNOSIS — M25572 Pain in left ankle and joints of left foot: Secondary | ICD-10-CM | POA: Diagnosis not present

## 2019-02-09 MED ORDER — NAPROXEN 250 MG PO TABS
500.0000 mg | ORAL_TABLET | Freq: Once | ORAL | Status: AC
  Administered 2019-02-09: 500 mg via ORAL
  Filled 2019-02-09: qty 2
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
## 2019-02-09 NOTE — Discharge Instructions (Addendum)

## 2019-03-19 ENCOUNTER — Other Ambulatory Visit: Payer: Self-pay | Admitting: Family Medicine

## 2019-03-19 DIAGNOSIS — G8929 Other chronic pain: Secondary | ICD-10-CM

## 2019-03-25 ENCOUNTER — Other Ambulatory Visit: Payer: Self-pay | Admitting: Family Medicine

## 2019-03-25 DIAGNOSIS — F330 Major depressive disorder, recurrent, mild: Secondary | ICD-10-CM

## 2019-04-01 ENCOUNTER — Other Ambulatory Visit: Payer: Self-pay | Admitting: Family Medicine

## 2019-04-01 DIAGNOSIS — J4520 Mild intermittent asthma, uncomplicated: Secondary | ICD-10-CM

## 2019-04-25 ENCOUNTER — Other Ambulatory Visit: Payer: Self-pay | Admitting: Family Medicine

## 2019-04-25 DIAGNOSIS — G4709 Other insomnia: Secondary | ICD-10-CM

## 2019-04-26 NOTE — Telephone Encounter (Signed)
Requested medication (s) are due for refill today: yes  Requested medication (s) are on the active medication list: yes  Last refill:  03/01/2019  Future visit scheduled: yes  Notes to clinic: refill cannot be delegated  Requesting 1 year supply   Requested Prescriptions  Pending Prescriptions Disp Refills   QUEtiapine (SEROQUEL) 25 MG tablet [Pharmacy Med Name: QUETIAPINE  25MG   TAB] 90 tablet 3    Sig: TAKE 1 TABLET BY MOUTH AT  BEDTIME     Not Delegated - Psychiatry:  Antipsychotics - Second Generation (Atypical) - quetiapine Failed - 04/25/2019  9:14 PM      Failed - This refill cannot be delegated      Passed - ALT in normal range and within 180 days    ALT  Date Value Ref Range Status  02/02/2019 11 0 - 32 IU/L Final         Passed - AST in normal range and within 180 days    AST  Date Value Ref Range Status  02/02/2019 12 0 - 40 IU/L Final         Passed - Last BP in normal range    BP Readings from Last 1 Encounters:  02/09/19 118/83         Passed - Valid encounter within last 6 months    Recent Outpatient Visits          2 months ago Vitamin D deficiency   [facility] Steele Sizer, MD   6 months ago Multiple sclerosis, relapsing-remitting [facility])   [facility] [place], Drue Stager, MD   8 months ago Pain of toe of left foot   [facility] Steele Sizer, MD   9 months ago Multiple sclerosis, relapsing-remitting [facility])   [facility] Steele Sizer, MD   9 months ago Burning with urination   [place], NP      Future Appointments            In 5 days Steele Sizer, MD [facility] [place] - Completed PHQ-2 or PHQ-9 in the last 360 days.

## 2019-05-01 ENCOUNTER — Other Ambulatory Visit: Payer: Self-pay

## 2019-05-01 ENCOUNTER — Encounter: Payer: Self-pay | Admitting: Family Medicine

## 2019-05-01 ENCOUNTER — Ambulatory Visit (INDEPENDENT_AMBULATORY_CARE_PROVIDER_SITE_OTHER): Payer: BC Managed Care – PPO | Admitting: Family Medicine

## 2019-05-01 VITALS — BP 130/90 | HR 87 | Temp 96.9°F | Resp 16 | Ht 69.0 in | Wt 197.1 lb

## 2019-05-01 DIAGNOSIS — G8929 Other chronic pain: Secondary | ICD-10-CM

## 2019-05-01 DIAGNOSIS — G35 Multiple sclerosis: Secondary | ICD-10-CM | POA: Diagnosis not present

## 2019-05-01 DIAGNOSIS — F902 Attention-deficit hyperactivity disorder, combined type: Secondary | ICD-10-CM

## 2019-05-01 DIAGNOSIS — D473 Essential (hemorrhagic) thrombocythemia: Secondary | ICD-10-CM | POA: Diagnosis not present

## 2019-05-01 DIAGNOSIS — F330 Major depressive disorder, recurrent, mild: Secondary | ICD-10-CM | POA: Diagnosis not present

## 2019-05-01 DIAGNOSIS — Z23 Encounter for immunization: Secondary | ICD-10-CM | POA: Diagnosis not present

## 2019-05-01 DIAGNOSIS — E538 Deficiency of other specified B group vitamins: Secondary | ICD-10-CM

## 2019-05-01 DIAGNOSIS — D75839 Thrombocytosis, unspecified: Secondary | ICD-10-CM

## 2019-05-01 DIAGNOSIS — E559 Vitamin D deficiency, unspecified: Secondary | ICD-10-CM

## 2019-05-01 DIAGNOSIS — J4520 Mild intermittent asthma, uncomplicated: Secondary | ICD-10-CM

## 2019-05-01 DIAGNOSIS — Z1231 Encounter for screening mammogram for malignant neoplasm of breast: Secondary | ICD-10-CM

## 2019-05-01 DIAGNOSIS — M5442 Lumbago with sciatica, left side: Secondary | ICD-10-CM

## 2019-05-01 MED ORDER — MELOXICAM 7.5 MG PO TABS: 7.5000 mg | ORAL_TABLET | Freq: Every day | ORAL | 0 refills | Status: DC | PRN

## 2019-05-01 MED ORDER — ALBUTEROL SULFATE HFA 108 (90 BASE) MCG/ACT IN AERS: 1.0000 | INHALATION_SPRAY | Freq: Four times a day (QID) | RESPIRATORY_TRACT | 0 refills | Status: DC | PRN

## 2019-05-01 MED ORDER — AMPHETAMINE-DEXTROAMPHET ER 20 MG PO CP24: 20.0000 mg | ORAL_CAPSULE | ORAL | 0 refills | Status: DC

## 2019-05-01 MED ORDER — DULOXETINE HCL 30 MG PO CPEP: 90.0000 mg | ORAL_CAPSULE | Freq: Every day | ORAL | 1 refills | Status: DC

## 2019-05-01 MED ORDER — AMPHETAMINE-DEXTROAMPHETAMINE 10 MG PO TABS: 10.0000 mg | ORAL_TABLET | Freq: Every day | ORAL | 0 refills | Status: DC

## 2019-05-01 NOTE — Progress Notes (Signed)
Name: Robin Chan   MRN: [id number]    DOB: Feb 05, 1971   Date:05/01/2019       Progress Note  Subjective  Chief Complaint  Chief Complaint  Patient presents with  . Medication Refill    Working 2 part-time jobs Advertising copywriter)  . Multiple Sclerosis  . ADD  . Migraine  . Asthma  . Insomnia  . Depression    HPI  ADD: taking medication daily as prescribed, on Adderal XR 20 mg now and she states she has been feeling well with medication, no side effects.She states she feels very sluggishand no energy or ability to focus without medication. She started working  March 2019 helps with her focus,she is living by herself in a house now, sometimes wakes up very late on weekendsshe cannot the XR, takes immediate release on weekends only   Low back pain: she was seen here with left radiculitis on 07/11 took a round of prednisone taper and symptoms improved a little, but still having daily low back pain , no longer has radiculitis.She saw Dr. Catalina Antigua at [facility], had MRI lumbar spine and was diagnosed with DDD and was given meloxicam and methocarbamolhowever she stopped muscle relaxer because it was causing thigh pain and fatigue. She states upper back no longer an issue since working at a different section and no longer getting spooked by a co-worker that has a habit of being loud or scaring co-workers  History of small bowel obstruction: had surgery done in May 2020 , no longer has abdominal pain,  but normal bowel movements and no blood in stools. Doing well , no problems and appetite is normal. Still takes Miralax   MS: diagnosed in 2000, but first symptoms in 1991 - she felt numb on the right side and weakness. Currently off medication because of multiple side effects. Has intermittent weakness and has used a cane periodically, for left foot drop - worse when she is tired and when it rains. Adderall helps with energy level. She still has left leg  numbness intermittent - can last from hours to days. She started part time work at [employer] 03/19. 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 She had to work both jobs once last week, and she was exhausted at the end of the day, she is not sure how she will be able to handle it , but willing to try it.   Insomnia: she has been off Ambien, we stoppedit on theSpring 2018. She is taking Seroquel low dose qhs and helps her fall asleep ( takes about 10-20 minutes ) ,she wakes up having night terrors intermittently but not recently. She states she has been dreaming, and not used to that.   Major Depression: she was taking Lexapro for many years and depression was not controlled, we switched to Cymbalta in June 2017, She is separated and divorce is going to be final this month. She is worried about insurance coverage .She is also seeing psychiatrist at [facility] on high dose duloxetine 90 mg, denies side effects, Phq 9negative, but very frustrated because just found out she still has insurance through ex-husband since divorce not final yet, and she had spent money getting a catastrophic insurance coverage. Currently waiting for ObamaCare, and states may need to switch pcp, she  is upset about that.   Asthma: she is taking singulairdaily now , she denies wheezing, cough or SOB at this timeShe states as long as she takes allergy medications her symptoms are controlled She needs refill of Ventolin   AR: rhinorrhea or nasal congestion controlled as long as she takes Xyzal, Zyrtec, Flonase and singulair . She is out of xyzal but will get it filled now.   Pituitary microadenoma: no significant change since 2011 -last one was 2019, diagnosed a mass possible Rathke's cleft cyst,last prolactin normalNo double vision, or nipple discharge. She is doing well  Patient  Active Problem List   Diagnosis Date Noted  . Partial small bowel obstruction ([place]) 12/11/2018  . Pelvic pain 04/07/2017  . Medication management 04/07/2017  . Rectocele 07/08/2015  . Status post laparoscopic assisted vaginal hysterectomy (LAVH) 07/08/2015  . History of hysterectomy for benign disease 05/26/2015  . Left lumbar radiculitis 03/28/2015  . Breast cancer screening 02/05/2015  . Abnormal CAT scan 12/31/2014  . ADD (attention deficit disorder) 12/31/2014  . Allergic to bees 12/31/2014  . Back muscle spasm 12/31/2014  . Insomnia, persistent 12/31/2014  . Constipation 12/31/2014  . Depression, major, recurrent, mild ([place]) 12/31/2014  . Personal history of traumatic fracture 12/31/2014  . Asthma, mild intermittent 12/31/2014  . Migraine with aura and without status migrainosus, not intractable 12/31/2014  . Multiple sclerosis, relapsing-remitting ([place]) 12/31/2014  . Endometriosis determined by laparoscopy 12/31/2014  . Perennial allergic rhinitis 12/31/2014  . Pituitary microadenoma ([place]) 12/31/2014  . Vitamin D deficiency 12/31/2014  . Acquired trigger finger 12/31/2014    Past Surgical History:  Procedure Laterality Date  . ABDOMINAL HYSTERECTOMY  05/26/2015  . BREAST BIOPSY Right 2005   benign  . LAPAROSCOPIC ASSISTED VAGINAL HYSTERECTOMY N/A 05/26/2015   Procedure: LAPAROSCOPIC ASSISTED VAGINAL HYSTERECTOMY, with right salpingectomy;  Surgeon: Brayton Mars, MD;  Location: [facility] ORS;  Service: Gynecology;  Laterality: N/A;  . LAPAROTOMY N/A 12/14/2018   Procedure: EXPLORATORY LAPAROTOMY;  Surgeon: Olean Ree, MD;  Location: [facility] ORS;  Service: General;  Laterality: N/A;  . OVARY SURGERY Left 09/30/2014  . RECTOCELE REPAIR N/A 05/26/2015   Procedure: POSTERIOR REPAIR (RECTOCELE);  Surgeon: Brayton Mars, MD;  Location: [facility] ORS;  Service: Gynecology;  Laterality: N/A;    Family History  Problem Relation Age of Onset  . Anemia Mother   .  Osteoporosis Mother   . Mental illness Father        Bipolar  . Arthritis Brother   . Breast cancer Maternal Aunt     Social History   Socioeconomic History  . Marital status: Legally Separated    Spouse name: Not on file  . Number of children: 2  . Years of education: Not on file  . Highest education level: Associate degree: academic program  Occupational History  . Occupation: Proofreader     Comment: [employer]   Social Needs  . Financial resource strain: Not hard at all  . Food insecurity    Worry: Never true    Inability: Never true  . Transportation needs    Medical: No    Non-medical: No  Tobacco Use  . Smoking status: Never Smoker  . Smokeless tobacco: Never Used  Substance and Sexual Activity  . Alcohol use: Yes    Alcohol/week: 14.0 standard drinks    Types: 14 Shots of liquor per week    Comment: occasionally, 1-2 shots every night  . Drug use: No  . Sexual activity: Yes  Partners: Male    Birth control/protection: None    Comment: separated   Lifestyle  . Physical activity    Days per week: 0 days    Minutes per session: 0 min  . Stress: Rather much  Relationships  . Social connections    Talks on phone: More than three times a week    Gets together: More than three times a week    Attends religious service: Never    Active member of club or organization: No    Attends meetings of clubs or organizations: Never    Relationship status: Married  . Intimate partner violence    Fear of current or ex partner: No    Emotionally abused: No    Physically abused: No    Forced sexual activity: No  Other Topics Concern  . Not on file  Social History Narrative   She is from [place].    Moved to [place] in 1998 because of husband's job transfer, that is when she stopped working -pending her green-card.   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    She had symptoms of MS in 2000 , daughter was 36 year old and just now found a job at [employer] March 2019    Separated since July 2019         Current Outpatient Medications:  .  albuterol (VENTOLIN HFA) 108 (90 Base) MCG/ACT inhaler, Inhale 1 puff into the lungs every 6 (six) hours as needed for wheezing or shortness of breath. Reported on 09/26/2015, Disp: 24 g, Rfl: 0 .  amphetamine-dextroamphetamine (ADDERALL XR) 20 MG 24 hr capsule, Take 1 capsule (20 mg total) by mouth every morning., Disp: 90 capsule, Rfl: 0 .  amphetamine-dextroamphetamine (ADDERALL) 10 MG tablet, Take 1 tablet (10 mg total) by mouth daily. On weekends, Disp: 30 tablet, Rfl: 0 .  cetirizine (ZYRTEC ALLERGY) 10 MG tablet, Take 10 mg by mouth at bedtime. , Disp: , Rfl:  .  DULoxetine (CYMBALTA) 30 MG capsule, Take 3 capsules (90 mg total) by mouth at bedtime., Disp: 270 capsule, Rfl: 1 .  EPINEPHrine 0.3 mg/0.3 mL IJ SOAJ injection, Inject 0.3 mg into the muscle as needed for anaphylaxis. , Disp: , Rfl:  .  fluticasone (FLONASE) 50 MCG/ACT nasal spray, Place 2 sprays into both nostrils daily., Disp: 48 g, Rfl: 1 .  levocetirizine (XYZAL) 5 MG tablet, Take 1 tablet (5 mg total) by mouth daily., Disp: 90 tablet, Rfl: 1 .  meloxicam (MOBIC) 7.5 MG tablet, Take 1 tablet (7.5 mg total) by mouth daily as needed. for pain, Disp: 90 tablet, Rfl: 0 .  montelukast (SINGULAIR) 10 MG tablet, TAKE 1 TABLET BY MOUTH AT  BEDTIME, Disp: 90 tablet, Rfl: 1 .  polyethylene glycol powder (GLYCOLAX/MIRALAX) powder, Take 17 g by mouth 2 (two) times daily as needed. (Patient taking differently: Take 17 g by mouth 2 (two) times daily as needed for mild constipation. ), Disp: 3350 g, Rfl: 1 .  QUEtiapine (SEROQUEL) 25 MG tablet, TAKE 1 TABLET BY MOUTH AT  BEDTIME, Disp: 90 tablet, Rfl: 0  Allergies  Allergen Reactions  . Penicillins Anaphylaxis    Did it involve swelling of the face/tongue/throat, SOB, or low BP? Yes Did it involve sudden or severe rash/hives, skin peeling, or any reaction on the inside of your mouth or nose? No Did you need to seek  medical attention at a hospital or doctor's office? Yes When did it last happen?A long time ago If all  above answers are "NO", may proceed with cephalosporin use.  Raelyn Ensign Venom     Bee    I personally reviewed active problem list, medication list, allergies, family history, social history, health maintenance with the patient/caregiver today.   ROS  Constitutional: Negative for fever or weight change.  Respiratory: Negative for cough and shortness of breath.   Cardiovascular: Negative for chest pain or palpitations.  Gastrointestinal: Negative for abdominal pain, no bowel changes.  Musculoskeletal: Negative for gait problem or joint swelling.  Skin: Negative for rash.  Neurological: Negative for dizziness or headache.  No other specific complaints in a complete review of systems (except as listed in HPI above).  Objective  Vitals:   05/01/19 0916  BP: 130/90  Pulse: 87  Resp: 16  Temp: (!) 96.9 F (36.1 C)  TempSrc: Temporal  SpO2: 99%  Weight: 197 lb 1.6 oz (89.4 kg)  Height: 5\' 9"  (1.753 m)    Body mass index is 29.11 kg/m.  Physical Exam  Constitutional: Patient appears well-developed and well-nourished. Obese  No distress.  HEENT: head atraumatic, normocephalic, pupils equal and reactive to light Cardiovascular: Normal rate, regular rhythm and normal heart sounds.  No murmur heard. No BLE edema. Pulmonary/Chest: Effort normal and breath sounds normal. No respiratory distress. Abdominal: Soft.  There is no tenderness. Psychiatric: Patient has a normal mood and affect. behavior is normal. Judgment and thought content normal. Neurological: no focal deficit, except for some dysarthria, no foot drop   Recent Results (from the past 2160 hour(s))  Lipid panel     Status: Abnormal   Collection Time: 02/02/19 10:21 AM  Result Value Ref Range   Cholesterol, Total 194 100 - 199 mg/dL   Triglycerides 105 0 - 149 mg/dL   HDL 52 >39 mg/dL   VLDL Cholesterol Cal 21 5 - 40  mg/dL   LDL Calculated 121 (H) 0 - 99 mg/dL   Chol/HDL Ratio 3.7 0.0 - 4.4 ratio    Comment:                                   T. Chol/HDL Ratio                                             Men  Women                               1/2 Avg.Risk  3.4    3.3                                   Avg.Risk  5.0    4.4                                2X Avg.Risk  9.6    7.1                                3X Avg.Risk 23.4   11.0   Comprehensive metabolic panel     Status: None   Collection Time: 02/02/19 10:21 AM  Result  Value Ref Range   Glucose 98 65 - 99 mg/dL   BUN 12 6 - 24 mg/dL   Creatinine, Ser 1.00 0.57 - 1.00 mg/dL   GFR calc non Af Amer 67 >59 mL/min/1.73   GFR calc Af Amer 78 >59 mL/min/1.73   BUN/Creatinine Ratio 12 9 - 23   Sodium 139 134 - 144 mmol/L   Potassium 4.7 3.5 - 5.2 mmol/L   Chloride 102 96 - 106 mmol/L   CO2 23 20 - 29 mmol/L   Calcium 9.4 8.7 - 10.2 mg/dL   Total Protein 6.6 6.0 - 8.5 g/dL   Albumin 4.4 3.8 - 4.8 g/dL   Globulin, Total 2.2 1.5 - 4.5 g/dL   Albumin/Globulin Ratio 2.0 1.2 - 2.2   Bilirubin Total 0.6 0.0 - 1.2 mg/dL   Alkaline Phosphatase 78 39 - 117 IU/L   AST 12 0 - 40 IU/L   ALT 11 0 - 32 IU/L  CBC with Differential/Platelet     Status: Abnormal   Collection Time: 02/02/19 10:21 AM  Result Value Ref Range   WBC 6.3 3.4 - 10.8 x10E3/uL   RBC 4.09 3.77 - 5.28 x10E6/uL   Hemoglobin 12.8 11.1 - 15.9 g/dL   Hematocrit 38.4 34.0 - 46.6 %   MCV 94 79 - 97 fL   MCH 31.3 26.6 - 33.0 pg   MCHC 33.3 31.5 - 35.7 g/dL   RDW 12.3 11.7 - 15.4 %   Platelets 473 (H) 150 - 450 x10E3/uL   Neutrophils 52 Not Estab. %   Lymphs 36 Not Estab. %   Monocytes 8 Not Estab. %   Eos 2 Not Estab. %   Basos 1 Not Estab. %   Neutrophils Absolute 3.4 1.4 - 7.0 x10E3/uL   Lymphocytes Absolute 2.3 0.7 - 3.1 x10E3/uL   Monocytes Absolute 0.5 0.1 - 0.9 x10E3/uL   EOS (ABSOLUTE) 0.1 0.0 - 0.4 x10E3/uL   Basophils Absolute 0.1 0.0 - 0.2 x10E3/uL   Immature Granulocytes 1  Not Estab. %   Immature Grans (Abs) 0.0 0.0 - 0.1 x10E3/uL  B12 and Folate Panel     Status: None   Collection Time: 02/02/19 10:21 AM  Result Value Ref Range   Vitamin B-12 325 232 - 1,245 pg/mL   Folate 7.8 >3.0 ng/mL    Comment: A serum folate concentration of less than 3.1 ng/mL is considered to represent clinical deficiency.   Hemoglobin A1c     Status: None   Collection Time: 02/02/19 10:21 AM  Result Value Ref Range   Hgb A1c MFr Bld 5.5 4.8 - 5.6 %    Comment:          Prediabetes: 5.7 - 6.4          Diabetes: >6.4          Glycemic control for adults with diabetes: <7.0    Est. average glucose Bld gHb Est-mCnc 111 mg/dL  VITAMIN D 25 Hydroxy (Vit-D Deficiency, Fractures)     Status: Abnormal   Collection Time: 02/02/19 10:21 AM  Result Value Ref Range   Vit D, 25-Hydroxy 24.2 (L) 30.0 - 100.0 ng/mL    Comment: Vitamin D deficiency has been defined by the [place] and an Endocrine Society practice guideline as a level of serum 25-OH vitamin D less than 20 ng/mL (1,2). The Endocrine Society went on to further define vitamin D insufficiency as a level between 21 and 29 ng/mL (2). 1. IOM (Institute of Medicine). 2010. Dietary  reference    intakes for calcium and D. [place]: The    [employer]. 2. Holick MF, Binkley [place], Bischoff-Ferrari HA, et al.    Evaluation, treatment, and prevention of vitamin D    deficiency: an Endocrine Society clinical practice    guideline. JCEM. 2011 Jul; 96(7):1911-30.   Prolactin     Status: None   Collection Time: 02/02/19 10:21 AM  Result Value Ref Range   Prolactin 13.4 4.8 - 23.3 ng/mL  TSH     Status: None   Collection Time: 02/02/19 10:21 AM  Result Value Ref Range   TSH 1.260 0.450 - 4.500 uIU/mL     PHQ2/9: Depression screen [facility] 2/9 05/01/2019 01/29/2019 10/11/2018 07/07/2018 05/08/2018  Decreased Interest 0 0 0 0 0  Down, Depressed, Hopeless 0 0 0 0 0  PHQ - 2 Score 0 0 0 0 0  Altered sleeping 1  1 2  0 0  Tired, decreased energy 1 1 1 1 1   Change in appetite 2 1 0 0 0  Feeling bad or failure about yourself  0 0 0 0 0  Trouble concentrating 0 0 0 0 3  Moving slowly or fidgety/restless 0 0 0 0 0  Suicidal thoughts 0 0 0 - 0  PHQ-9 Score 4 3 3 1 4   Difficult doing work/chores Not difficult at all Not difficult at all Not difficult at all Not difficult at all Somewhat difficult  Some recent data might be hidden    phq 9 is negative   Fall Risk: Fall Risk  05/01/2019 01/29/2019 10/11/2018 07/11/2018 07/11/2018  Falls in the past year? 0 0 0 0 1  Number falls in past yr: 0 0 0 - 1  Injury with Fall? 0 0 0 - 1  Comment - - - - -  Risk Factor Category  - - - - -  Comment - - - - -  Risk for fall due to : - - - - History of fall(s)  Follow up - - - - -     Functional Status Survey: Is the patient deaf or have difficulty hearing?: No Does the patient have difficulty seeing, even when wearing glasses/contacts?: Yes Does the patient have difficulty concentrating, remembering, or making decisions?: No Does the patient have difficulty walking or climbing stairs?: No Does the patient have difficulty dressing or bathing?: No Does the patient have difficulty doing errands alone such as visiting a doctor's office or shopping?: No    Assessment & Plan  1. Multiple sclerosis, relapsing-remitting (HCC)  - amphetamine-dextroamphetamine (ADDERALL XR) 20 MG 24 hr capsule; Take 1 capsule (20 mg total) by mouth every morning.  Dispense: 90 capsule; Refill: 0  2. Need for immunization against influenza  - Flu Vaccine QUAD 36+ mos IM  3. Depression, major, recurrent, mild (HCC)  - amphetamine-dextroamphetamine (ADDERALL XR) 20 MG 24 hr capsule; Take 1 capsule (20 mg total) by mouth every morning.  Dispense: 90 capsule; Refill: 0 - DULoxetine (CYMBALTA) 30 MG capsule; Take 3 capsules (90 mg total) by mouth at bedtime.  Dispense: 270 capsule; Refill: 1  4. Attention deficit hyperactivity  disorder (ADHD), combined type  - amphetamine-dextroamphetamine (ADDERALL XR) 20 MG 24 hr capsule; Take 1 capsule (20 mg total) by mouth every morning.  Dispense: 90 capsule; Refill: 0 - amphetamine-dextroamphetamine (ADDERALL) 10 MG tablet; Take 1 tablet (10 mg total) by mouth daily. On weekends  Dispense: 30 tablet; Refill: 0  5. Breast cancer screening by mammogram  -  MM Digital Screening; Future  6. B12 deficiency  - Vitamin B12  7. Vitamin D deficiency  - VITAMIN D 25 Hydroxy (Vit-D Deficiency, Fractures)  8. Thrombocytosis (HCC)  - CBC with Differential/Platelet  9. Chronic left-sided low back pain with left-sided sciatica  - meloxicam (MOBIC) 7.5 MG tablet; Take 1 tablet (7.5 mg total) by mouth daily as needed. for pain  Dispense: 90 tablet; Refill: 0  10. Mild intermittent asthma without complication  - albuterol (VENTOLIN HFA) 108 (90 Base) MCG/ACT inhaler; Inhale 1 puff into the lungs every 6 (six) hours as needed for wheezing or shortness of breath. Reported on 09/26/2015  Dispense: 24 g; Refill: 0

## 2019-05-02 ENCOUNTER — Other Ambulatory Visit: Payer: Self-pay | Admitting: Family Medicine

## 2019-05-02 DIAGNOSIS — F902 Attention-deficit hyperactivity disorder, combined type: Secondary | ICD-10-CM

## 2019-05-02 LAB — VITAMIN B12: Vitamin B-12: 791 pg/mL (ref 232–1245)

## 2019-05-02 LAB — CBC WITH DIFFERENTIAL/PLATELET
Basophils Absolute: 0.1 10*3/uL (ref 0.0–0.2)
Basos: 1 %
EOS (ABSOLUTE): 0.1 10*3/uL (ref 0.0–0.4)
Eos: 1 %
Hematocrit: 39.2 % (ref 34.0–46.6)
Hemoglobin: 12.7 g/dL (ref 11.1–15.9)
Immature Grans (Abs): 0.1 10*3/uL (ref 0.0–0.1)
Immature Granulocytes: 1 %
Lymphocytes Absolute: 2.3 10*3/uL (ref 0.7–3.1)
Lymphs: 27 %
MCH: 30.4 pg (ref 26.6–33.0)
MCHC: 32.4 g/dL (ref 31.5–35.7)
MCV: 94 fL (ref 79–97)
Monocytes Absolute: 0.7 10*3/uL (ref 0.1–0.9)
Monocytes: 8 %
Neutrophils Absolute: 5.5 10*3/uL (ref 1.4–7.0)
Neutrophils: 62 %
Platelets: 478 10*3/uL — ABNORMAL HIGH (ref 150–450)
RBC: 4.18 x10E6/uL (ref 3.77–5.28)
RDW: 12 % (ref 11.7–15.4)
WBC: 8.7 10*3/uL (ref 3.4–10.8)

## 2019-05-02 LAB — VITAMIN D 25 HYDROXY (VIT D DEFICIENCY, FRACTURES): Vit D, 25-Hydroxy: 29.7 ng/mL — ABNORMAL LOW (ref 30.0–100.0)

## 2019-05-02 MED ORDER — AMPHETAMINE-DEXTROAMPHETAMINE 10 MG PO TABS: 10.0000 mg | ORAL_TABLET | Freq: Every day | ORAL | 0 refills | Status: DC

## 2019-05-30 ENCOUNTER — Encounter: Payer: Self-pay | Admitting: Family Medicine

## 2019-05-30 DIAGNOSIS — N760 Acute vaginitis: Secondary | ICD-10-CM | POA: Diagnosis not present

## 2019-05-30 DIAGNOSIS — R300 Dysuria: Secondary | ICD-10-CM | POA: Diagnosis not present

## 2019-06-19 ENCOUNTER — Other Ambulatory Visit: Payer: Self-pay | Admitting: Family Medicine

## 2019-06-19 DIAGNOSIS — G8929 Other chronic pain: Secondary | ICD-10-CM

## 2019-07-15 ENCOUNTER — Other Ambulatory Visit: Payer: Self-pay | Admitting: Family Medicine

## 2019-07-15 DIAGNOSIS — G4709 Other insomnia: Secondary | ICD-10-CM

## 2019-08-01 ENCOUNTER — Ambulatory Visit: Payer: BC Managed Care – PPO | Admitting: Family Medicine

## 2019-08-23 ENCOUNTER — Ambulatory Visit: Payer: Medicaid Other | Attending: Internal Medicine

## 2019-08-23 DIAGNOSIS — Z20822 Contact with and (suspected) exposure to covid-19: Secondary | ICD-10-CM | POA: Insufficient documentation

## 2019-08-24 LAB — NOVEL CORONAVIRUS, NAA: SARS-CoV-2, NAA: NOT DETECTED

## 2019-08-28 ENCOUNTER — Other Ambulatory Visit: Payer: Self-pay

## 2019-08-28 ENCOUNTER — Ambulatory Visit: Payer: BC Managed Care – PPO | Admitting: Family Medicine

## 2019-08-28 ENCOUNTER — Telehealth: Payer: Self-pay

## 2019-08-28 ENCOUNTER — Encounter: Payer: Self-pay | Admitting: Family Medicine

## 2019-08-28 VITALS — BP 110/80 | HR 106 | Temp 96.9°F | Resp 16 | Ht 69.0 in | Wt 200.0 lb

## 2019-08-28 DIAGNOSIS — K432 Incisional hernia without obstruction or gangrene: Secondary | ICD-10-CM

## 2019-08-28 DIAGNOSIS — G4709 Other insomnia: Secondary | ICD-10-CM

## 2019-08-28 DIAGNOSIS — J4520 Mild intermittent asthma, uncomplicated: Secondary | ICD-10-CM

## 2019-08-28 DIAGNOSIS — E559 Vitamin D deficiency, unspecified: Secondary | ICD-10-CM

## 2019-08-28 DIAGNOSIS — G35 Multiple sclerosis: Secondary | ICD-10-CM

## 2019-08-28 DIAGNOSIS — E538 Deficiency of other specified B group vitamins: Secondary | ICD-10-CM

## 2019-08-28 DIAGNOSIS — R7303 Prediabetes: Secondary | ICD-10-CM

## 2019-08-28 DIAGNOSIS — M5442 Lumbago with sciatica, left side: Secondary | ICD-10-CM

## 2019-08-28 DIAGNOSIS — D473 Essential (hemorrhagic) thrombocythemia: Secondary | ICD-10-CM

## 2019-08-28 DIAGNOSIS — J3089 Other allergic rhinitis: Secondary | ICD-10-CM

## 2019-08-28 DIAGNOSIS — D352 Benign neoplasm of pituitary gland: Secondary | ICD-10-CM

## 2019-08-28 DIAGNOSIS — J302 Other seasonal allergic rhinitis: Secondary | ICD-10-CM

## 2019-08-28 DIAGNOSIS — D75839 Thrombocytosis, unspecified: Secondary | ICD-10-CM

## 2019-08-28 DIAGNOSIS — F321 Major depressive disorder, single episode, moderate: Secondary | ICD-10-CM

## 2019-08-28 DIAGNOSIS — G8929 Other chronic pain: Secondary | ICD-10-CM

## 2019-08-28 DIAGNOSIS — F902 Attention-deficit hyperactivity disorder, combined type: Secondary | ICD-10-CM

## 2019-08-28 MED ORDER — AMPHETAMINE-DEXTROAMPHETAMINE 10 MG PO TABS: 10.0000 mg | ORAL_TABLET | Freq: Every day | ORAL | 0 refills | Status: DC

## 2019-08-28 MED ORDER — AMPHETAMINE-DEXTROAMPHET ER 20 MG PO CP24: 20.0000 mg | ORAL_CAPSULE | ORAL | 0 refills | Status: DC

## 2019-08-28 NOTE — Progress Notes (Signed)
Name: Robin Chan   MRN: [id number]    DOB: 11/08/1970   Date:08/28/2019       Progress Note  Subjective  Chief Complaint  Chief Complaint  Patient presents with  . Abdominal Pain    She has concerns that she may have an umblicial hernia. She states that she has an extended belly button, abdominal pain and bloating x 2.5 months.  . Medication Refill    HPI  Hernia: she had a exploratory laparotomy done 11/2018 for small bowel obstruction and was doing well, however over the past few months she noticed intermittent abdominal bloating and pain and a constant bulging on umbilical area and under scar.   ADD: taking medication daily as prescribed, on Adderal XR 20 mg now and she states she has been feeling well with medicationShe states she feels very sluggishand no energy or ability to focus without medication. Shestarted workingMarch 2019 helps with her focus,she is living by herself in a house now, sometimes wakes up very late on weekendsshe cannot the XR, takes immediate release on weekends only . She states her boyfriend's mother has moved in with them.  Low back pain: she was seen here with left radiculitis on 01/2019 took a round of prednisone taper and symptoms improved a little, but still having daily low back pain , no longer has radiculitis.She saw Dr. Catalina Antigua at [facility], had MRI lumbar spine and was diagnosed with DDD and was given meloxicam and methocarbamolhowever she stopped muscle relaxer because it was causing thigh pain and fatigue. Symptoms are stable at this time , taking Meloxicam prn   MS: diagnosed in 2000, but first symptoms in 1991 - she felt numb on the right side and weakness. Currently off medication because of multiple side effects. Has intermittent weakness and has used a cane periodically, for left foot drop - worse when she is tired and when it rains. Adderall helps with energy level. She still has left leg numbness intermittent - can last from  hours to days. She started part time work at [employer] 03/19. She is now working full time and seems to be tolerating it well. She also has a part time job at [employer] She feels exhausted at the end of the day, but seems to be less tired lately   Insomnia: she has been off Ambien, we stoppedit on theSpring 2018. She is taking Seroquel low dose qhs and helps her fall asleep ( takes about 10-20 minutes ) ,she wakes up having night terrors intermittently but not recently, but states she wants to stay on medication   Major Depression: she was taking Lexapro for many years and depression was not controlled, we switched to Cymbalta in June 2017, She is separatedand divorce finalized Fall 2020.She stopped seeing psychiatrist at [facility] but stopped months ago. She is  high dose duloxetine 90 mg, she states medication works well, but frustrated with boyfriend's mother that lives with them. She feels like she does not have her own space. Discussed gratitude journal   Asthma: she is taking singulairdaily now , she denies wheezing, cough or SOB at this timeShe states as long as she takes allergy medications her symptoms are controlledShe needs refill of Ventolin   AR: rhinorrhea or nasal congestion controlled as long as she takes Xyzal, Zyrtec, Flonase and singulair. Unchanged Worse in the Spring   Pituitary microadenoma: no significant change since 2011 -last one was 2019, diagnosed a mass possible Rathke's cleft cyst,last prolactin normalNo double vision,  or nipple discharge.  Unchanged   Patient Active Problem List   Diagnosis Date Noted  . Partial small bowel obstruction ([place]) 12/11/2018  . Pelvic pain 04/07/2017  . Medication management 04/07/2017  . Rectocele 07/08/2015  . Status post laparoscopic assisted vaginal hysterectomy ([facility]) 07/08/2015  . History of hysterectomy for benign disease 05/26/2015  . Left lumbar radiculitis 03/28/2015  . Breast cancer screening  02/05/2015  . Abnormal CAT scan 12/31/2014  . ADD (attention deficit disorder) 12/31/2014  . Allergic to bees 12/31/2014  . Back muscle spasm 12/31/2014  . Insomnia, persistent 12/31/2014  . Constipation 12/31/2014  . Depression, major, recurrent, mild ([place]) 12/31/2014  . Personal history of traumatic fracture 12/31/2014  . Asthma, mild intermittent 12/31/2014  . Migraine with aura and without status migrainosus, not intractable 12/31/2014  . Multiple sclerosis, relapsing-remitting ([place]) 12/31/2014  . Endometriosis determined by laparoscopy 12/31/2014  . Perennial allergic rhinitis 12/31/2014  . Pituitary microadenoma ([place]) 12/31/2014  . Vitamin D deficiency 12/31/2014  . Acquired trigger finger 12/31/2014    Past Surgical History:  Procedure Laterality Date  . ABDOMINAL HYSTERECTOMY  05/26/2015  . BREAST BIOPSY Right 2005   benign  . LAPAROSCOPIC ASSISTED VAGINAL HYSTERECTOMY N/A 05/26/2015   Procedure: LAPAROSCOPIC ASSISTED VAGINAL HYSTERECTOMY, with right salpingectomy;  Surgeon: Brayton Mars, MD;  Location: [facility] ORS;  Service: Gynecology;  Laterality: N/A;  . LAPAROTOMY N/A 12/14/2018   Procedure: EXPLORATORY LAPAROTOMY;  Surgeon: Olean Ree, MD;  Location: [facility] ORS;  Service: General;  Laterality: N/A;  . OVARY SURGERY Left 09/30/2014  . RECTOCELE REPAIR N/A 05/26/2015   Procedure: POSTERIOR REPAIR (RECTOCELE);  Surgeon: Brayton Mars, MD;  Location: [facility] ORS;  Service: Gynecology;  Laterality: N/A;    Family History  Problem Relation Age of Onset  . Anemia Mother   . Osteoporosis Mother   . Mental illness Father        Bipolar  . Arthritis Brother   . Breast cancer Maternal Aunt      Current Outpatient Medications:  .  albuterol (VENTOLIN HFA) 108 (90 Base) MCG/ACT inhaler, Inhale 1 puff into the lungs every 6 (six) hours as needed for wheezing or shortness of breath. Reported on 09/26/2015, Disp: 24 g, Rfl: 0 .  amphetamine-dextroamphetamine  (ADDERALL XR) 20 MG 24 hr capsule, Take 1 capsule (20 mg total) by mouth every morning., Disp: 90 capsule, Rfl: 0 .  amphetamine-dextroamphetamine (ADDERALL) 10 MG tablet, Take 1 tablet (10 mg total) by mouth daily. On weekends, Disp: 30 tablet, Rfl: 0 .  cetirizine (ZYRTEC ALLERGY) 10 MG tablet, Take 10 mg by mouth at bedtime. , Disp: , Rfl:  .  DULoxetine (CYMBALTA) 30 MG capsule, Take 3 capsules (90 mg total) by mouth at bedtime., Disp: 270 capsule, Rfl: 1 .  EPINEPHrine 0.3 mg/0.3 mL IJ SOAJ injection, Inject 0.3 mg into the muscle as needed for anaphylaxis. , Disp: , Rfl:  .  fluticasone (FLONASE) 50 MCG/ACT nasal spray, Place 2 sprays into both nostrils daily., Disp: 48 g, Rfl: 1 .  levocetirizine (XYZAL) 5 MG tablet, Take 1 tablet (5 mg total) by mouth daily., Disp: 90 tablet, Rfl: 1 .  meloxicam (MOBIC) 7.5 MG tablet, TAKE 1 TABLET BY MOUTH  DAILY AS NEEDED FOR PAIN, Disp: 90 tablet, Rfl: 0 .  montelukast (SINGULAIR) 10 MG tablet, TAKE 1 TABLET BY MOUTH AT  BEDTIME, Disp: 90 tablet, Rfl: 1 .  polyethylene glycol powder (GLYCOLAX/MIRALAX) powder, Take 17 g by mouth 2 (two) times daily as  needed. (Patient taking differently: Take 17 g by mouth 2 (two) times daily as needed for mild constipation. ), Disp: 3350 g, Rfl: 1 .  QUEtiapine (SEROQUEL) 25 MG tablet, TAKE 1 TABLET BY MOUTH AT  BEDTIME, Disp: 90 tablet, Rfl: 0  Allergies  Allergen Reactions  . Penicillins Anaphylaxis    Did it involve swelling of the face/tongue/throat, SOB, or low BP? Yes Did it involve sudden or severe rash/hives, skin peeling, or any reaction on the inside of your mouth or nose? No Did you need to seek medical attention at a hospital or doctor's office? Yes When did it last happen?A long time ago If all above answers are "NO", may proceed with cephalosporin use.  Raelyn Ensign Venom     Bee    I personally reviewed active problem list, medication list, allergies, family history, social history, health maintenance with  the patient/caregiver today.   ROS  Constitutional: Negative for fever or weight change.  Respiratory: Negative for cough and shortness of breath.   Cardiovascular: Negative for chest pain or palpitations.  Gastrointestinal: Negative for abdominal pain, no bowel changes.  Musculoskeletal: Negative for gait problem or joint swelling.  Skin: Negative for rash.  Neurological: Negative for dizziness or headache.  No other specific complaints in a complete review of systems (except as listed in HPI above). Objective  Vitals:   08/28/19 0848  BP: 110/80  Pulse: (!) 106  Resp: 16  Temp: (!) 96.9 F (36.1 C)  TempSrc: Temporal  SpO2: 95%  Weight: 200 lb (90.7 kg)  Height: 5\' 9"  (1.753 m)    Body mass index is 29.53 kg/m.  Physical Exam  Constitutional: Patient appears well-developed and well-nourished. Obese  No distress.  HEENT: head atraumatic, normocephalic, pupils equal and reactive to light Cardiovascular: Normal rate, regular rhythm and normal heart sounds.  No murmur heard. No BLE edema. Pulmonary/Chest: Effort normal and breath sounds normal. No respiratory distress. Abdominal: Soft.  There is large umbilical hernia , possible incisional hernia adjacent to umbilicus , tender but reducible  Psychiatric: Patient has a normal mood and affect. behavior is normal. Judgment and thought content normal.  Recent Results (from the past 2160 hour(s))  Novel Coronavirus, NAA ([facility])     Status: None   Collection Time: 08/23/19  1:17 PM   Specimen: Nasopharyngeal(NP) swabs in vial transport medium   NASOPHARYNGE  TESTING  Result Value Ref Range   SARS-CoV-2, NAA Not Detected Not Detected    Comment: This nucleic acid amplification test was developed and its performance characteristics determined by [employer]. Nucleic acid amplification tests include RT-PCR and TMA. This test has not been FDA cleared or approved. This test has been authorized by FDA under an  Emergency Use Authorization (EUA). This test is only authorized for the duration of time the declaration that circumstances exist justifying the authorization of the emergency use of in vitro diagnostic tests for detection of SARS-CoV-2 virus and/or diagnosis of COVID-19 infection under section 564(b)(1) of the Act, 21 U.S.C. [id number]) (1), unless the authorization is terminated or revoked sooner. When diagnostic testing is negative, the possibility of a false negative result should be considered in the context of a patient's recent exposures and the presence of clinical signs and symptoms consistent with COVID-19. An individual without symptoms of COVID-19 and who is not shedding SARS-CoV-2 virus wo uld expect to have a negative (not detected) result in this assay.      PHQ2/9: Depression screen [facility] 2/9 08/28/2019 05/01/2019 01/29/2019  10/11/2018 07/07/2018  Decreased Interest 2 0 0 0 0  Down, Depressed, Hopeless 2 0 0 0 0  PHQ - 2 Score 4 0 0 0 0  Altered sleeping 1 1 1 2  0  Tired, decreased energy 2 1 1 1 1   Change in appetite 3 2 1  0 0  Feeling bad or failure about yourself  2 0 0 0 0  Trouble concentrating 2 0 0 0 0  Moving slowly or fidgety/restless 0 0 0 0 0  Suicidal thoughts 0 0 0 0 -  PHQ-9 Score 14 4 3 3 1   Difficult doing work/chores Somewhat difficult Not difficult at all Not difficult at all Not difficult at all Not difficult at all  Some recent data might be hidden    phq 9 is positive   Fall Risk: Fall Risk  05/01/2019 01/29/2019 10/11/2018 07/11/2018 07/11/2018  Falls in the past year? 0 0 0 0 1  Number falls in past yr: 0 0 0 - 1  Injury with Fall? 0 0 0 - 1  Comment - - - - -  Risk Factor Category  - - - - -  Comment - - - - -  Risk for fall due to : - - - - History of fall(s)  Follow up - - - - -     Functional Status Survey: Is the patient deaf or have difficulty hearing?: No Does the patient have difficulty seeing, even when wearing glasses/contacts?:  No Does the patient have difficulty concentrating, remembering, or making decisions?: Yes Does the patient have difficulty walking or climbing stairs?: No Does the patient have difficulty dressing or bathing?: No Does the patient have difficulty doing errands alone such as visiting a doctor's office or shopping?: No   Assessment & Plan  1. Multiple sclerosis, relapsing-remitting (HCC)  - amphetamine-dextroamphetamine (ADDERALL XR) 20 MG 24 hr capsule; Take 1 capsule (20 mg total) by mouth every morning.  Dispense: 30 capsule; Refill: 0  2. Vitamin D deficiency  Continue vitamin D   3. Mild intermittent asthma without complication   4. Perennial allergic rhinitis with seasonal variation   5. Chronic left-sided low back pain with left-sided sciatica  Stable on prn meloxicam   6. B12 deficiency  Continue B12 supplementation   7. Pre-diabetes   8. Pituitary microadenoma (HCC)  Unchanged   9. Attention deficit hyperactivity disorder (ADHD), combined type  - amphetamine-dextroamphetamine (ADDERALL) 10 MG tablet; Take 1 tablet (10 mg total) by mouth daily. On weekends  Dispense: 30 tablet; Refill: 0 - amphetamine-dextroamphetamine (ADDERALL XR) 20 MG 24 hr capsule; Take 1 capsule (20 mg total) by mouth every morning.  Dispense: 30 capsule; Refill: 0  10. Moderate major depression (HCC)  - amphetamine-dextroamphetamine (ADDERALL XR) 20 MG 24 hr capsule; Take 1 capsule (20 mg total) by mouth every morning.  Dispense: 30 capsule; Refill: 0  11. Other insomnia  On seroquel   12. Thrombocytosis ([place])  Recheck next visit   13. Incisional hernia without obstruction or gangrene  - Ambulatory referral to General Surgery

## 2019-08-28 NOTE — Telephone Encounter (Signed)
Robin Chan with [facility] needs clarification on the 10 mg Adderall. Is she only taking 10 mg on the weekends. Qty dispense can't be 30.

## 2019-08-29 NOTE — Telephone Encounter (Signed)
Pharmacy called. Pharmacy aware.

## 2019-08-31 ENCOUNTER — Encounter: Payer: Self-pay | Admitting: Family Medicine

## 2019-09-04 ENCOUNTER — Other Ambulatory Visit: Payer: Self-pay

## 2019-09-04 DIAGNOSIS — G4709 Other insomnia: Secondary | ICD-10-CM

## 2019-09-04 DIAGNOSIS — F902 Attention-deficit hyperactivity disorder, combined type: Secondary | ICD-10-CM

## 2019-09-04 DIAGNOSIS — G35 Multiple sclerosis: Secondary | ICD-10-CM

## 2019-09-04 DIAGNOSIS — J302 Other seasonal allergic rhinitis: Secondary | ICD-10-CM

## 2019-09-04 DIAGNOSIS — F321 Major depressive disorder, single episode, moderate: Secondary | ICD-10-CM

## 2019-09-04 DIAGNOSIS — J4520 Mild intermittent asthma, uncomplicated: Secondary | ICD-10-CM

## 2019-09-04 MED ORDER — FLUTICASONE PROPIONATE 50 MCG/ACT NA SUSP: 2.0000 | Freq: Every day | NASAL | 1 refills | Status: DC

## 2019-09-04 MED ORDER — LEVOCETIRIZINE DIHYDROCHLORIDE 5 MG PO TABS: 5.0000 mg | ORAL_TABLET | Freq: Every day | ORAL | 1 refills | Status: DC

## 2019-09-04 MED ORDER — QUETIAPINE FUMARATE 25 MG PO TABS: 25.0000 mg | ORAL_TABLET | Freq: Every day | ORAL | 1 refills | Status: DC

## 2019-09-12 ENCOUNTER — Ambulatory Visit: Payer: BC Managed Care – PPO | Admitting: Surgery

## 2019-09-12 ENCOUNTER — Encounter: Payer: Self-pay | Admitting: Surgery

## 2019-09-12 ENCOUNTER — Other Ambulatory Visit: Payer: Self-pay

## 2019-09-12 VITALS — BP 131/81 | HR 99 | Temp 97.3°F | Resp 12 | Ht 70.0 in | Wt 198.8 lb

## 2019-09-12 DIAGNOSIS — K432 Incisional hernia without obstruction or gangrene: Secondary | ICD-10-CM

## 2019-09-12 NOTE — Progress Notes (Signed)
09/12/2019  History of Present Illness: Robin Chan is a 49 y.o. female status post exploratory laparotomy and lysis of adhesions on 12/14/2018 for small bowel obstruction that was not resolving with conservative measures.  She had been doing well and recently saw her PCP on 08/28/2019 with concerns for possible incisional hernia.  She was referred to [place] for further evaluation.  She reports that she noted the hernia a few months ago and she feels it has gotten bigger, and also is symptomatic, with some pain at the umbilical area.  She notices it more when she's doing some strenuous activity at work, and sometimes also when she strains with a bowel movement.  She has had some nausea, but is unsure if it's from her MS rather than from the hernia.  Denies any fevers, chills, chest pain, shortness of breath.  Past Medical History: Past Medical History:  Diagnosis Date  . ADHD (attention deficit hyperactivity disorder)   . Allergy   . Anxiety   . Chronic pelvic pain in female   . Complex ovarian cyst   . Depression   . Dyspareunia   . Endometriosis   . Fibroid   . GERD (gastroesophageal reflux disease)   . Heavy period   . MS (multiple sclerosis) ([place])   . Painful menstruation   . Rectocele      Past Surgical History: Past Surgical History:  Procedure Laterality Date  . ABDOMINAL HYSTERECTOMY  05/26/2015  . BREAST BIOPSY Right 2005   benign  . LAPAROSCOPIC ASSISTED VAGINAL HYSTERECTOMY N/A 05/26/2015   Procedure: LAPAROSCOPIC ASSISTED VAGINAL HYSTERECTOMY, with right salpingectomy;  Surgeon: Brayton Mars, MD;  Location: [facility] ORS;  Service: Gynecology;  Laterality: N/A;  . LAPAROTOMY N/A 12/14/2018   Procedure: EXPLORATORY LAPAROTOMY;  Surgeon: Olean Ree, MD;  Location: [facility] ORS;  Service: General;  Laterality: N/A;  . OVARY SURGERY Left 09/30/2014  . RECTOCELE REPAIR N/A 05/26/2015   Procedure: POSTERIOR REPAIR (RECTOCELE);  Surgeon: Brayton Mars, MD;  Location:  [facility] ORS;  Service: Gynecology;  Laterality: N/A;    Home Medications: Prior to Admission medications   Medication Sig Start Date End Date Taking? Authorizing Provider  albuterol (VENTOLIN HFA) 108 (90 Base) MCG/ACT inhaler Inhale 1 puff into the lungs every 6 (six) hours as needed for wheezing or shortness of breath. Reported on 09/26/2015 05/01/19   Steele Sizer, MD  amphetamine-dextroamphetamine (ADDERALL XR) 20 MG 24 hr capsule Take 1 capsule (20 mg total) by mouth every morning. 08/28/19   Steele Sizer, MD  amphetamine-dextroamphetamine (ADDERALL) 10 MG tablet Take 1 tablet (10 mg total) by mouth daily. On weekends 08/28/19   Steele Sizer, MD  cetirizine (ZYRTEC ALLERGY) 10 MG tablet Take 10 mg by mouth at bedtime.     [provider]  DULoxetine (CYMBALTA) 30 MG capsule Take 3 capsules (90 mg total) by mouth at bedtime. 05/01/19   Steele Sizer, MD  EPINEPHrine 0.3 mg/0.3 mL IJ SOAJ injection Inject 0.3 mg into the muscle as needed for anaphylaxis.     [provider]  fluticasone (FLONASE) 50 MCG/ACT nasal spray Place 2 sprays into both nostrils daily. 09/04/19   Steele Sizer, MD  levocetirizine (XYZAL) 5 MG tablet Take 1 tablet (5 mg total) by mouth daily. 09/04/19   Steele Sizer, MD  meloxicam (MOBIC) 7.5 MG tablet TAKE 1 TABLET BY MOUTH  DAILY AS NEEDED FOR PAIN 06/19/19   Ancil Boozer, Drue Stager, MD  montelukast (SINGULAIR) 10 MG tablet TAKE 1 TABLET BY MOUTH AT  BEDTIME 04/02/19   Sowles, Drue Stager, MD  polyethylene glycol powder (GLYCOLAX/MIRALAX) powder Take 17 g by mouth 2 (two) times daily as needed. Patient taking differently: Take 17 g by mouth 2 (two) times daily as needed for mild constipation.  07/11/18   Steele Sizer, MD  QUEtiapine (SEROQUEL) 25 MG tablet Take 1 tablet (25 mg total) by mouth at bedtime. 09/04/19   Steele Sizer, MD    Allergies: Allergies  Allergen Reactions  . Penicillins Anaphylaxis    Did it involve swelling of the face/tongue/throat,  SOB, or low BP? Yes Did it involve sudden or severe rash/hives, skin peeling, or any reaction on the inside of your mouth or nose? No Did you need to seek medical attention at a hospital or doctor's office? Yes When did it last happen?A long time ago If all above answers are "NO", may proceed with cephalosporin use.  Raelyn Ensign Venom     Bee    Review of Systems: Review of Systems  Constitutional: Negative for chills and fever.  Respiratory: Negative for shortness of breath.   Cardiovascular: Negative for chest pain.  Gastrointestinal: Positive for abdominal pain. Negative for constipation, nausea and vomiting.    Physical Exam BP 131/81   Pulse 99   Temp (!) 97.3 F (36.3 C) (Temporal)   Resp 12   Ht 5\' 10"  (1.778 m)   Wt 198 lb 12.8 oz (90.2 kg)   LMP 05/19/2015   SpO2 96%   BMI 28.52 kg/m  CONSTITUTIONAL: No acute distress HEENT:  Normocephalic, atraumatic, extraocular motion intact. RESPIRATORY:  Lungs are clear, and breath sounds are equal bilaterally. Normal respiratory effort without pathologic use of accessory muscles. CARDIOVASCULAR: Heart is regular without murmurs, gallops, or rubs. GI: The abdomen is soft, non-distended, with some tenderness to palpation at the umbilical area.  Her midline incision is healed, but there is a hernia defect at the umbilicus, measuring about 3 x 3 cm. There were no palpable masses. NEUROLOGIC:  Motor and sensation is grossly normal.  Cranial nerves are grossly intact. PSYCH:  Alert and oriented to person, place and time. Affect is normal.  Labs/Imaging: None recently  Assessment and Plan: This is a 49 y.o. female status post exploratory laparotomy with lysis of adhesions on 11/2018 for small bowel obstruction, now presenting with incisional hernia.  -Discussed with the patient that she has an incisional hernia.  She is symptomatic and it is reasonable to pursue hernia repair.  Discussed with her the options for open vs laparoscopic  hernia repair.  Discussed with her that both would use mesh to reinforce the repair.  She has opted for open repair.  This would be an outpatient procedure.  Discussed risks of bleeding, infection, and injury to surrounding structures.  Discussed post-op restriction of no heavy lifting or pushing of no more than 10-15 lbs for 4-6 weeks.   --Patient will check at her jobs regarding the best timing for her surgery.  She will call us to let us know when she would like to schedule her surgery.  She understands that she would need a COVID test prior to surgery.  Face-to-face time spent with the patient and care providers was 25 minutes, with more than 50% of the time spent counseling, educating, and coordinating care of the patient.     Melvyn Neth, [facility]

## 2019-09-12 NOTE — Patient Instructions (Addendum)
You have requested for your Incisional Hernia be repaired. This has been scheduled with Dr. Kirke Corin at [facility].  Please see your (blue)pre-care sheet for information. You may call us once you have an idea when you would like to have your surgery. Our surgery scheduler will speak with you about surgery information.  You will need to arrange to be off work for 1-2 weeks but will have to have a lifting restriction of no more than 15 lbs for 6 weeks following your surgery.  Incisional Hernia, Adult A hernia is a bulge of tissue that pushes through an opening between muscles. An incisional hernia happens in the abdomen. The hernia may contain tissues from the small intestine, large intestine, or fatty tissue covering the intestines (omentum). Hernias in adults tend to get worse over time, and they require surgical treatment. There are several types of hernias. You may have:   A hernia that forms through an opening from a previous surgery (direct hernia).  A hernia that comes and goes (reducible hernia). A reducible hernia may be visible only when you strain, lift something heavy, or cough. This type of hernia can be pushed back into the abdomen (reduced).  A hernia that traps abdominal tissue inside the hernia (incarcerated hernia). This type of hernia cannot be reduced.  A hernia that cuts off blood flow to the tissues inside the hernia (strangulated hernia). The tissues can start to die if this happens. This type of hernia requires emergency treatment.  What are the causes? A hernia happens when tissue inside the abdomen presses on a weak area of the abdominal muscles. What increases the risk? You may have a greater risk of this condition if you:  Are obese.  Have had several pregnancies.  Have a buildup of fluid inside your abdomen (ascites).  Have had surgery that weakens the abdominal muscles.  What are the signs or symptoms? The main symptom of this condition is a  painless bulge. A reducible hernia may be visible only when you strain, lift something heavy, or cough. Other symptoms may include:  Dull pain.  A feeling of pressure.  Symptoms of a strangulated hernia may include:  Pain that gets increasingly worse.  Nausea and vomiting.  Pain when pressing on the hernia.  Skin over the hernia becoming red or purple.  Constipation.  Blood in the stool.  How is this diagnosed? This condition may be diagnosed based on:  A physical exam. You may be asked to cough or strain while standing. These actions increase the pressure inside your abdomen and force the hernia through the opening in your muscles. Your health care provider may try to reduce the hernia by pressing on it.  Your symptoms and medical history.  How is this treated? Surgery is the only treatment for an umbilical hernia. Surgery for a strangulated hernia is done as soon as possible. If you have a small hernia that is not incarcerated, you may need to lose weight before having surgery. Follow these instructions at home:  Lose weight, if told by your health care provider.  Do not try to push the hernia back in.  Watch your hernia for any changes in color or size. Tell your health care provider if any changes occur.  You may need to avoid activities that increase pressure on your hernia.  Do not lift anything that is heavier than 10 lb (4.5 kg) until your health care provider says that this is safe.  Take over-the-counter and prescription medicines  only as told by your health care provider.  Keep all follow-up visits as told by your health care provider. This is important. Contact a health care provider if:  Your hernia gets larger.  Your hernia becomes painful. Get help right away if:  You develop sudden, severe pain near the area of your hernia.  You have pain as well as nausea or vomiting.  You have pain and the skin over your hernia changes color.  You develop a  fever. This information is not intended to replace advice given to you by your health care provider. Make sure you discuss any questions you have with your health care provider. Document Released: 12/12/2015 Document Revised: 03/14/2016 Document Reviewed: 12/12/2015 Elsevier Interactive Patient Education  [employer].

## 2019-09-12 NOTE — H&P (View-Only) (Signed)
09/12/2019  History of Present Illness: Robin Chan is a 49 y.o. female status post exploratory laparotomy and lysis of adhesions on 12/14/2018 for small bowel obstruction that was not resolving with conservative measures.  She had been doing well and recently saw her PCP on 08/28/2019 with concerns for possible incisional hernia.  She was referred to [place] for further evaluation.  She reports that she noted the hernia a few months ago and she feels it has gotten bigger, and also is symptomatic, with some pain at the umbilical area.  She notices it more when she's doing some strenuous activity at work, and sometimes also when she strains with a bowel movement.  She has had some nausea, but is unsure if it's from her MS rather than from the hernia.  Denies any fevers, chills, chest pain, shortness of breath.  Past Medical History: Past Medical History:  Diagnosis Date  . ADHD (attention deficit hyperactivity disorder)   . Allergy   . Anxiety   . Chronic pelvic pain in female   . Complex ovarian cyst   . Depression   . Dyspareunia   . Endometriosis   . Fibroid   . GERD (gastroesophageal reflux disease)   . Heavy period   . MS (multiple sclerosis) ([place])   . Painful menstruation   . Rectocele      Past Surgical History: Past Surgical History:  Procedure Laterality Date  . ABDOMINAL HYSTERECTOMY  05/26/2015  . BREAST BIOPSY Right 2005   benign  . LAPAROSCOPIC ASSISTED VAGINAL HYSTERECTOMY N/A 05/26/2015   Procedure: LAPAROSCOPIC ASSISTED VAGINAL HYSTERECTOMY, with right salpingectomy;  Surgeon: Brayton Mars, MD;  Location: [facility] ORS;  Service: Gynecology;  Laterality: N/A;  . LAPAROTOMY N/A 12/14/2018   Procedure: EXPLORATORY LAPAROTOMY;  Surgeon: Olean Ree, MD;  Location: [facility] ORS;  Service: General;  Laterality: N/A;  . OVARY SURGERY Left 09/30/2014  . RECTOCELE REPAIR N/A 05/26/2015   Procedure: POSTERIOR REPAIR (RECTOCELE);  Surgeon: Brayton Mars, MD;  Location:  [facility] ORS;  Service: Gynecology;  Laterality: N/A;    Home Medications: Prior to Admission medications   Medication Sig Start Date End Date Taking? Authorizing Provider  albuterol (VENTOLIN HFA) 108 (90 Base) MCG/ACT inhaler Inhale 1 puff into the lungs every 6 (six) hours as needed for wheezing or shortness of breath. Reported on 09/26/2015 05/01/19   Steele Sizer, MD  amphetamine-dextroamphetamine (ADDERALL XR) 20 MG 24 hr capsule Take 1 capsule (20 mg total) by mouth every morning. 08/28/19   Steele Sizer, MD  amphetamine-dextroamphetamine (ADDERALL) 10 MG tablet Take 1 tablet (10 mg total) by mouth daily. On weekends 08/28/19   Steele Sizer, MD  cetirizine (ZYRTEC ALLERGY) 10 MG tablet Take 10 mg by mouth at bedtime.     [provider]  DULoxetine (CYMBALTA) 30 MG capsule Take 3 capsules (90 mg total) by mouth at bedtime. 05/01/19   Steele Sizer, MD  EPINEPHrine 0.3 mg/0.3 mL IJ SOAJ injection Inject 0.3 mg into the muscle as needed for anaphylaxis.     [provider]  fluticasone (FLONASE) 50 MCG/ACT nasal spray Place 2 sprays into both nostrils daily. 09/04/19   Steele Sizer, MD  levocetirizine (XYZAL) 5 MG tablet Take 1 tablet (5 mg total) by mouth daily. 09/04/19   Steele Sizer, MD  meloxicam (MOBIC) 7.5 MG tablet TAKE 1 TABLET BY MOUTH  DAILY AS NEEDED FOR PAIN 06/19/19   Ancil Boozer, Drue Stager, MD  montelukast (SINGULAIR) 10 MG tablet TAKE 1 TABLET BY MOUTH AT  BEDTIME 04/02/19   Sowles, Drue Stager, MD  polyethylene glycol powder (GLYCOLAX/MIRALAX) powder Take 17 g by mouth 2 (two) times daily as needed. Patient taking differently: Take 17 g by mouth 2 (two) times daily as needed for mild constipation.  07/11/18   Steele Sizer, MD  QUEtiapine (SEROQUEL) 25 MG tablet Take 1 tablet (25 mg total) by mouth at bedtime. 09/04/19   Steele Sizer, MD    Allergies: Allergies  Allergen Reactions  . Penicillins Anaphylaxis    Did it involve swelling of the face/tongue/throat,  SOB, or low BP? Yes Did it involve sudden or severe rash/hives, skin peeling, or any reaction on the inside of your mouth or nose? No Did you need to seek medical attention at a hospital or doctor's office? Yes When did it last happen?A long time ago If all above answers are "NO", may proceed with cephalosporin use.  Raelyn Ensign Venom     Bee    Review of Systems: Review of Systems  Constitutional: Negative for chills and fever.  Respiratory: Negative for shortness of breath.   Cardiovascular: Negative for chest pain.  Gastrointestinal: Positive for abdominal pain. Negative for constipation, nausea and vomiting.    Physical Exam BP 131/81   Pulse 99   Temp (!) 97.3 F (36.3 C) (Temporal)   Resp 12   Ht 5\' 10"  (1.778 m)   Wt 198 lb 12.8 oz (90.2 kg)   LMP 05/19/2015   SpO2 96%   BMI 28.52 kg/m  CONSTITUTIONAL: No acute distress HEENT:  Normocephalic, atraumatic, extraocular motion intact. RESPIRATORY:  Lungs are clear, and breath sounds are equal bilaterally. Normal respiratory effort without pathologic use of accessory muscles. CARDIOVASCULAR: Heart is regular without murmurs, gallops, or rubs. GI: The abdomen is soft, non-distended, with some tenderness to palpation at the umbilical area.  Her midline incision is healed, but there is a hernia defect at the umbilicus, measuring about 3 x 3 cm. There were no palpable masses. NEUROLOGIC:  Motor and sensation is grossly normal.  Cranial nerves are grossly intact. PSYCH:  Alert and oriented to person, place and time. Affect is normal.  Labs/Imaging: None recently  Assessment and Plan: This is a 49 y.o. female status post exploratory laparotomy with lysis of adhesions on 11/2018 for small bowel obstruction, now presenting with incisional hernia.  -Discussed with the patient that she has an incisional hernia.  She is symptomatic and it is reasonable to pursue hernia repair.  Discussed with her the options for open vs laparoscopic  hernia repair.  Discussed with her that both would use mesh to reinforce the repair.  She has opted for open repair.  This would be an outpatient procedure.  Discussed risks of bleeding, infection, and injury to surrounding structures.  Discussed post-op restriction of no heavy lifting or pushing of no more than 10-15 lbs for 4-6 weeks.   --Patient will check at her jobs regarding the best timing for her surgery.  She will call us to let us know when she would like to schedule her surgery.  She understands that she would need a COVID test prior to surgery.  Face-to-face time spent with the patient and care providers was 25 minutes, with more than 50% of the time spent counseling, educating, and coordinating care of the patient.     Melvyn Neth, [facility]

## 2019-09-17 ENCOUNTER — Telehealth: Payer: Self-pay | Admitting: *Deleted

## 2019-09-17 NOTE — Telephone Encounter (Signed)
Patient called and wants a call back to get surgery scheduled, she saw Dr.Piscoya on 09/12/19 incisinal hernia

## 2019-09-18 ENCOUNTER — Telehealth: Payer: Self-pay | Admitting: Surgery

## 2019-09-18 NOTE — Telephone Encounter (Signed)
Pt has been advised of pre admission date/time, Covid Testing date and Surgery date.  Surgery Date: 10/09/19 Preadmission Testing Date: 09/28/19 (8a-1p) Covid Testing Date: 10/05/19 - patient advised to go to the [facility] ([place])  Patient has been made aware to call [phone], between 1-3:00pm the day before surgery, to find out what time to arrive.

## 2019-09-18 NOTE — Telephone Encounter (Signed)
Pt has been advised of pre admission date/time, Covid Testing date and Surgery date.  Surgery Date: 10/09/19 Preadmission Testing Date: 09/28/19 (8a-1p) Covid Testing Date: 10/05/19 - patient advised to go to the [facility] ([place])  Patient has been made aware to call [phone], between 1-3:00pm the day before surgery, to find out what time to arrive.

## 2019-09-26 ENCOUNTER — Telehealth: Payer: Self-pay | Admitting: *Deleted

## 2019-09-26 NOTE — Telephone Encounter (Signed)
Faxed FMLA to [phone] with attention to Oren Binet

## 2019-09-28 ENCOUNTER — Other Ambulatory Visit: Payer: Self-pay

## 2019-09-28 ENCOUNTER — Encounter
Admission: RE | Admit: 2019-09-28 | Discharge: 2019-09-28 | Disposition: A | Discharge status: Home / Self Care | Payer: BC Managed Care – PPO | Source: Ambulatory Visit | Attending: Surgery | Admitting: Surgery

## 2019-09-28 NOTE — Patient Instructions (Signed)
Your procedure is scheduled on: 10/09/19 Report to [place]. To find out your arrival time please call [phone] between 1PM - 3PM on 10/08/19.  Remember: Instructions that are not followed completely may result in serious medical risk, up to and including death, or upon the discretion of your surgeon and anesthesiologist your surgery may need to be rescheduled.     _X__ 1. Do not eat food after midnight the night before your procedure.                 No gum chewing or hard candies. You may drink clear liquids up to 2 hours                 before you are scheduled to arrive for your surgery- DO not drink clear                 liquids within 2 hours of the start of your surgery.                 Clear Liquids include:  water, apple juice without pulp, clear carbohydrate                 drink such as Clearfast or Gatorade, Black Coffee or Tea (Do not add                 anything to coffee or tea). Diabetics water only  __X__2.  On the morning of surgery brush your teeth with toothpaste and water, you                 may rinse your mouth with mouthwash if you wish.  Do not swallow any              toothpaste of mouthwash.     _X__ 3.  No Alcohol for 24 hours before or after surgery.   _X__ 4.  Do Not Smoke or use e-cigarettes For 24 Hours Prior to Your Surgery.                 Do not use any chewable tobacco products for at least 6 hours prior to                 surgery.  ____  5.  Bring all medications with you on the day of surgery if instructed.   __X__  6.  Notify your doctor if there is any change in your medical condition      (cold, fever, infections).     Do not wear jewelry, make-up, hairpins, clips or nail polish. Do not wear lotions, powders, or perfumes.  Do not shave 48 hours prior to surgery. Men may shave face and neck. Do not bring valuables to the hospital.    [facility] is not responsible for any belongings or  valuables.  Contacts, dentures/partials or body piercings may not be worn into surgery. Bring a case for your contacts, glasses or hearing aids, a denture cup will be supplied. Leave your suitcase in the car. After surgery it may be brought to your room. For patients admitted to the hospital, discharge time is determined by your treatment team.   Patients discharged the day of surgery will not be allowed to drive home.   Please read over the following fact sheets that you were given:   MRSA Information  __X__ Take these medicines the morning of surgery with A SIP OF WATER:  1. levocetirizine (XYZAL) 5 MG tablet  2.   3.   4.  5.  6.  ____ Fleet Enema (as directed)   __X__ Use CHG Soap/SAGE wipes as directed  ____ Use inhalers on the day of surgery  ____ Stop metformin/Janumet/Farxiga 2 days prior to surgery    ____ Take 1/2 of usual insulin dose the night before surgery. No insulin the morning          of surgery.   ____ Stop Blood Thinners Coumadin/Plavix/Xarelto/Pleta/Pradaxa/Eliquis/Effient/Aspirin  on   Or contact your Surgeon, Cardiologist or Medical Doctor regarding  ability to stop your blood thinners  __X__ Stop Anti-inflammatories 7 days before surgery such as Advil, Ibuprofen, Motrin,  BC or Goodies Powder, Naprosyn, Naproxen, Aleve, Aspirin MELOXICAM   __X__ Stop all herbal supplements, fish oil or vitamin E until after surgery.    ____ Bring C-Pap to the hospital.

## 2019-10-03 ENCOUNTER — Telehealth: Payer: Self-pay

## 2019-10-03 ENCOUNTER — Encounter: Payer: Self-pay | Admitting: Surgery

## 2019-10-03 NOTE — Telephone Encounter (Signed)
Spoke with patient to notify her that it would be OK for her to have her COVID vaccine on Friday prior to her surgery scheduled for Tuesday. Patient verbalized understanding.

## 2019-10-05 ENCOUNTER — Other Ambulatory Visit
Admission: RE | Admit: 2019-10-05 | Discharge: 2019-10-05 | Disposition: A | Discharge status: Home / Self Care | Payer: BC Managed Care – PPO | Source: Ambulatory Visit | Attending: Surgery | Admitting: Surgery

## 2019-10-05 DIAGNOSIS — Z01812 Encounter for preprocedural laboratory examination: Secondary | ICD-10-CM | POA: Insufficient documentation

## 2019-10-05 DIAGNOSIS — Z20822 Contact with and (suspected) exposure to covid-19: Secondary | ICD-10-CM | POA: Insufficient documentation

## 2019-10-05 LAB — CBC
HCT: 39.5 % (ref 36.0–46.0)
Hemoglobin: 12.7 g/dL (ref 12.0–15.0)
MCH: 30.2 pg (ref 26.0–34.0)
MCHC: 32.2 g/dL (ref 30.0–36.0)
MCV: 93.8 fL (ref 80.0–100.0)
Platelets: 493 10*3/uL — ABNORMAL HIGH (ref 150–400)
RBC: 4.21 MIL/uL (ref 3.87–5.11)
RDW: 12 % (ref 11.5–15.5)
WBC: 7.6 10*3/uL (ref 4.0–10.5)
nRBC: 0 % (ref 0.0–0.2)

## 2019-10-05 LAB — SARS CORONAVIRUS 2 (TAT 6-24 HRS): SARS Coronavirus 2: NEGATIVE

## 2019-10-08 MED ORDER — CLINDAMYCIN PHOSPHATE 900 MG/50ML IV SOLN
900.0000 mg | INTRAVENOUS | Status: AC
  Administered 2019-10-09: 900 mg via INTRAVENOUS

## 2019-10-09 ENCOUNTER — Other Ambulatory Visit: Payer: Self-pay

## 2019-10-09 ENCOUNTER — Ambulatory Visit
Admission: RE | Admit: 2019-10-09 | Discharge: 2019-10-09 | Disposition: A | Discharge status: Home / Self Care | Payer: BC Managed Care – PPO | Attending: Surgery | Admitting: Surgery

## 2019-10-09 ENCOUNTER — Encounter: Payer: Self-pay | Admitting: Surgery

## 2019-10-09 ENCOUNTER — Ambulatory Visit: Payer: BC Managed Care – PPO | Admitting: Anesthesiology

## 2019-10-09 ENCOUNTER — Encounter
Admission: RE | Disposition: A | Discharge status: Home / Self Care | Payer: Self-pay | Source: Home / Self Care | Attending: Surgery

## 2019-10-09 DIAGNOSIS — Z79899 Other long term (current) drug therapy: Secondary | ICD-10-CM | POA: Diagnosis not present

## 2019-10-09 DIAGNOSIS — Z880 Allergy status to penicillin: Secondary | ICD-10-CM | POA: Diagnosis not present

## 2019-10-09 DIAGNOSIS — F909 Attention-deficit hyperactivity disorder, unspecified type: Secondary | ICD-10-CM | POA: Diagnosis not present

## 2019-10-09 DIAGNOSIS — G35 Multiple sclerosis: Secondary | ICD-10-CM | POA: Diagnosis not present

## 2019-10-09 DIAGNOSIS — Z91030 Bee allergy status: Secondary | ICD-10-CM | POA: Diagnosis not present

## 2019-10-09 DIAGNOSIS — Z791 Long term (current) use of non-steroidal anti-inflammatories (NSAID): Secondary | ICD-10-CM | POA: Diagnosis not present

## 2019-10-09 DIAGNOSIS — Z87892 Personal history of anaphylaxis: Secondary | ICD-10-CM | POA: Diagnosis not present

## 2019-10-09 DIAGNOSIS — K432 Incisional hernia without obstruction or gangrene: Secondary | ICD-10-CM | POA: Diagnosis not present

## 2019-10-09 DIAGNOSIS — J45909 Unspecified asthma, uncomplicated: Secondary | ICD-10-CM | POA: Diagnosis not present

## 2019-10-09 HISTORY — PX: INCISIONAL HERNIA REPAIR: SHX193

## 2019-10-09 HISTORY — PX: INSERTION OF MESH: SHX5868

## 2019-10-09 SURGERY — REPAIR, HERNIA, INCISIONAL
Anesthesia: General | Site: Abdomen

## 2019-10-09 MED ORDER — PHENYLEPHRINE HCL (PRESSORS) 10 MG/ML IV SOLN
INTRAVENOUS | Status: DC | PRN
  Administered 2019-10-09 (×3): 100 ug via INTRAVENOUS

## 2019-10-09 MED ORDER — GABAPENTIN 300 MG PO CAPS: 300.0000 mg | ORAL_CAPSULE | ORAL | Status: AC

## 2019-10-09 MED ORDER — FENTANYL CITRATE (PF) 100 MCG/2ML IJ SOLN
INTRAMUSCULAR | Status: DC | PRN
  Administered 2019-10-09 (×2): 50 ug via INTRAVENOUS

## 2019-10-09 MED ORDER — FENTANYL CITRATE (PF) 100 MCG/2ML IJ SOLN
INTRAMUSCULAR | Status: AC
  Filled 2019-10-09: qty 2

## 2019-10-09 MED ORDER — MIDAZOLAM HCL 2 MG/2ML IJ SOLN
INTRAMUSCULAR | Status: AC
  Filled 2019-10-09: qty 2

## 2019-10-09 MED ORDER — CLINDAMYCIN PHOSPHATE 900 MG/50ML IV SOLN
INTRAVENOUS | Status: AC
  Filled 2019-10-09: qty 50

## 2019-10-09 MED ORDER — IBUPROFEN 600 MG PO TABS: 600.0000 mg | ORAL_TABLET | Freq: Three times a day (TID) | ORAL | 1 refills | Status: DC | PRN

## 2019-10-09 MED ORDER — BUPIVACAINE LIPOSOME 1.3 % IJ SUSP
INTRAMUSCULAR | Status: DC | PRN
  Administered 2019-10-09: 20 mL

## 2019-10-09 MED ORDER — LIDOCAINE HCL (CARDIAC) PF 100 MG/5ML IV SOSY
PREFILLED_SYRINGE | INTRAVENOUS | Status: DC | PRN
  Administered 2019-10-09: 100 mg via INTRAVENOUS

## 2019-10-09 MED ORDER — BUPIVACAINE-EPINEPHRINE (PF) 0.25% -1:200000 IJ SOLN
INTRAMUSCULAR | Status: DC | PRN
  Administered 2019-10-09: 30 mL via PERINEURAL

## 2019-10-09 MED ORDER — PROPOFOL 10 MG/ML IV BOLUS
INTRAVENOUS | Status: DC | PRN
  Administered 2019-10-09: 150 mg via INTRAVENOUS

## 2019-10-09 MED ORDER — ROCURONIUM BROMIDE 100 MG/10ML IV SOLN
INTRAVENOUS | Status: DC | PRN
  Administered 2019-10-09: 60 mg via INTRAVENOUS
  Administered 2019-10-09: 10 mg via INTRAVENOUS

## 2019-10-09 MED ORDER — MIDAZOLAM HCL 2 MG/2ML IJ SOLN
INTRAMUSCULAR | Status: DC | PRN
  Administered 2019-10-09: 2 mg via INTRAVENOUS

## 2019-10-09 MED ORDER — ONDANSETRON HCL 4 MG/2ML IJ SOLN
INTRAMUSCULAR | Status: DC | PRN
  Administered 2019-10-09: 4 mg via INTRAVENOUS

## 2019-10-09 MED ORDER — PROPOFOL 10 MG/ML IV BOLUS
INTRAVENOUS | Status: AC
  Filled 2019-10-09: qty 20

## 2019-10-09 MED ORDER — LACTATED RINGERS IV SOLN: INTRAVENOUS | Status: DC

## 2019-10-09 MED ORDER — CHLORHEXIDINE GLUCONATE CLOTH 2 % EX PADS: 6.0000 | MEDICATED_PAD | Freq: Once | CUTANEOUS | Status: DC

## 2019-10-09 MED ORDER — ONDANSETRON HCL 4 MG/2ML IJ SOLN: 4.0000 mg | Freq: Once | INTRAMUSCULAR | Status: DC | PRN

## 2019-10-09 MED ORDER — OXYCODONE HCL 5 MG PO TABS: 5.0000 mg | ORAL_TABLET | ORAL | 0 refills | Status: DC | PRN

## 2019-10-09 MED ORDER — KETOROLAC TROMETHAMINE 30 MG/ML IJ SOLN
INTRAMUSCULAR | Status: DC | PRN
  Administered 2019-10-09: 30 mg via INTRAVENOUS

## 2019-10-09 MED ORDER — FAMOTIDINE 20 MG PO TABS
ORAL_TABLET | ORAL | Status: AC
  Administered 2019-10-09: 20 mg via ORAL
  Filled 2019-10-09: qty 1

## 2019-10-09 MED ORDER — LIDOCAINE HCL (PF) 2 % IJ SOLN
INTRAMUSCULAR | Status: AC
  Filled 2019-10-09: qty 10

## 2019-10-09 MED ORDER — DEXAMETHASONE SODIUM PHOSPHATE 10 MG/ML IJ SOLN
INTRAMUSCULAR | Status: DC | PRN
  Administered 2019-10-09: 10 mg via INTRAVENOUS

## 2019-10-09 MED ORDER — GABAPENTIN 300 MG PO CAPS
ORAL_CAPSULE | ORAL | Status: AC
  Administered 2019-10-09: 10:00:00 300 mg via ORAL
  Filled 2019-10-09: qty 1

## 2019-10-09 MED ORDER — ROCURONIUM BROMIDE 10 MG/ML (PF) SYRINGE
PREFILLED_SYRINGE | INTRAVENOUS | Status: AC
  Filled 2019-10-09: qty 10

## 2019-10-09 MED ORDER — BUPIVACAINE LIPOSOME 1.3 % IJ SUSP
INTRAMUSCULAR | Status: AC
  Filled 2019-10-09: qty 20

## 2019-10-09 MED ORDER — SUGAMMADEX SODIUM 200 MG/2ML IV SOLN
INTRAVENOUS | Status: DC | PRN
  Administered 2019-10-09: 200 mg via INTRAVENOUS

## 2019-10-09 MED ORDER — FAMOTIDINE 20 MG PO TABS: 20.0000 mg | ORAL_TABLET | Freq: Once | ORAL | Status: AC

## 2019-10-09 MED ORDER — ACETAMINOPHEN 500 MG PO TABS
ORAL_TABLET | ORAL | Status: AC
  Administered 2019-10-09: 1000 mg via ORAL
  Filled 2019-10-09: qty 2

## 2019-10-09 MED ORDER — FENTANYL CITRATE (PF) 100 MCG/2ML IJ SOLN: 25.0000 ug | INTRAMUSCULAR | Status: DC | PRN

## 2019-10-09 MED ORDER — BUPIVACAINE LIPOSOME 1.3 % IJ SUSP: 20.0000 mL | Freq: Once | INTRAMUSCULAR | Status: DC

## 2019-10-09 MED ORDER — ACETAMINOPHEN 500 MG PO TABS: 1000.0000 mg | ORAL_TABLET | ORAL | Status: AC

## 2019-10-09 SURGICAL SUPPLY — 33 items
CANISTER SUCT 1200ML W/VALVE (MISCELLANEOUS) ×4 IMPLANT
CHLORAPREP W/TINT 26 (MISCELLANEOUS) ×4 IMPLANT
COVER WAND RF STERILE (DRAPES) IMPLANT
DERMABOND ADVANCED (GAUZE/BANDAGES/DRESSINGS) ×2
DERMABOND ADVANCED .7 DNX12 (GAUZE/BANDAGES/DRESSINGS) ×2 IMPLANT
DRAPE LAPAROTOMY 100X77 ABD (DRAPES) ×4 IMPLANT
ELECT BLADE 6.5 EXT (BLADE) ×2 IMPLANT
ELECT CAUTERY BLADE 6.4 (BLADE) ×4 IMPLANT
ELECT REM PT RETURN 9FT ADLT (ELECTROSURGICAL) ×3
ELECTRODE REM PT RTRN 9FT ADLT (ELECTROSURGICAL) ×2 IMPLANT
GLOVE PROTEXIS LATEX SZ 7.5 (GLOVE) ×3 IMPLANT
GLOVE SURG LATEX 7.5 PF (GLOVE) ×2 IMPLANT
GLOVE SURG SYN 7.0 (GLOVE) ×3 IMPLANT
GLOVE SURG SYN 7.0 PF PI (GLOVE) ×2 IMPLANT
GOWN STRL REUS W/ TWL LRG LVL3 (GOWN DISPOSABLE) ×4 IMPLANT
GOWN STRL REUS W/TWL LRG LVL3 (GOWN DISPOSABLE) ×4
MESH VENT ST 8.12CM S OVL (Mesh General) ×2 IMPLANT
NEEDLE HYPO 22GX1.5 SAFETY (NEEDLE) ×6 IMPLANT
NS IRRIG 500ML POUR BTL (IV SOLUTION) ×4 IMPLANT
PACK BASIN MINOR ARMC (MISCELLANEOUS) ×4 IMPLANT
SPONGE LAP 18X18 RF (DISPOSABLE) ×2 IMPLANT
SUT ETHIBOND 0 (SUTURE) ×4 IMPLANT
SUT ETHIBOND 0 MO6 C/R (SUTURE) ×4 IMPLANT
SUT MNCRL AB 4-0 PS2 18 (SUTURE) ×4 IMPLANT
SUT PROLENE 2 0 SH DA (SUTURE) ×8 IMPLANT
SUT SILK 0 (SUTURE) ×2
SUT SILK 0 30XBRD TIE 6 (SUTURE) ×2 IMPLANT
SUT SILK 0 SH 30 (SUTURE) ×4 IMPLANT
SUT VIC AB 0 SH 27 (SUTURE) ×4 IMPLANT
SUT VIC AB 3-0 SH 27 (SUTURE) ×6
SUT VIC AB 3-0 SH 27X BRD (SUTURE) ×6 IMPLANT
SYR 10ML LL (SYRINGE) ×4 IMPLANT
SYR BULB IRRIG 60ML STRL (SYRINGE) ×2 IMPLANT

## 2019-10-09 NOTE — Anesthesia Preprocedure Evaluation (Signed)
Anesthesia Evaluation  Patient identified by MRN, date of birth, ID band Patient awake    Reviewed: Allergy & Precautions, NPO status , Patient's Chart, lab work & pertinent test results  History of Anesthesia Complications Negative for: history of anesthetic complications  Airway Mallampati: II       Dental  (+) Teeth Intact   Pulmonary asthma (no inhalers x 1 yr) , Current Smoker and Patient abstained from smoking.,    Pulmonary exam normal        Cardiovascular negative cardio ROS Normal cardiovascular exam     Neuro/Psych  Headaches, PSYCHIATRIC DISORDERS Anxiety Depression  Neuromuscular disease    GI/Hepatic Neg liver ROS, GERD (hx in past)  ,  Endo/Other  negative endocrine ROS  Renal/GU negative Renal ROS  Female GU complaint     Musculoskeletal   Abdominal   Peds negative pediatric ROS (+)  Hematology negative hematology ROS (+)   Anesthesia Other Findings Past Medical History: No date: ADHD (attention deficit hyperactivity disorder) No date: Allergy No date: Anxiety No date: Chronic pelvic pain in female No date: Complex ovarian cyst No date: Depression No date: Dyspareunia No date: Endometriosis No date: Fibroid No date: GERD (gastroesophageal reflux disease) No date: Heavy period No date: MS (multiple sclerosis) ([facility]) No date: Painful menstruation No date: Rectocele  Reproductive/Obstetrics                             Anesthesia Physical  Anesthesia Plan  ASA: II  Anesthesia Plan: General   Post-op Pain Management:    Induction: Intravenous  PONV Risk Score and Plan:   Airway Management Planned: Oral ETT  Additional Equipment:   Intra-op Plan:   Post-operative Plan: Extubation in OR  Informed Consent: I have reviewed the patients History and Physical, chart, labs and discussed the procedure including the risks, benefits and alternatives for the  proposed anesthesia with the patient or authorized representative who has indicated his/her understanding and acceptance.     Dental advisory given  Plan Discussed with: CRNA and Surgeon  Anesthesia Plan Comments:         Anesthesia Quick Evaluation

## 2019-10-09 NOTE — Op Note (Signed)
  Procedure Date:  10/09/2019  Pre-operative Diagnosis:  Incisional hernia  Post-operative Diagnosis:  Incisional hernia  Procedure:  Open Incisional Hernia Repair with mesh  Surgeon:  Melvyn Neth, MD  Assistant:  Burke Keels, PA-S  Anesthesia:  General endotracheal  Estimated Blood Loss:  15 ml  Specimens:  Hernia sac  Complications:  None  Indications for Procedure:  This is a 49 y.o. female who presents with an incisional hernia, s/p prior exlap for SBO.  The options of surgery versus observation were reviewed with the patient and/or family. The risks of bleeding, abscess or infection, recurrence of symptoms, injury to surrounding structures, and chronic pain were all discussed with the patient and was willing to proceed.  Description of Procedure: The patient was correctly identified in the preoperative area and brought into the operating room.  The patient was placed supine with VTE prophylaxis in place.  Appropriate time-outs were performed.  Anesthesia was induced and the patient was intubated.  Appropriate antibiotics were infused.  The abdomen was prepped and draped in a sterile fashion. The patient's hernia defect was marked with a marking pen.  A midline incision was created over the hernia defect and dissection was carried down the subcutaneous tissue using electrocautery.  The hernia sac was identified and dissected down to healthy fascia.  Adhesions to the omentum and bowel were taken down using cautery.  In doing so, the fascial defect was extended inferiorly in order to have better exposure for adhesiolysis.  The defect afterwards measured 6 cm in length.  50 ml of Exparel solution mixed with 0.25% bupivacaine with epi were infiltrated onto the peritoneum, fascia, and subcutaneous tissue.  An 8 x 12 cm Ventrio ST hernia mesh was placed in the abdominal cavity and secured to the fascial edges using multiple 2-0 Prolene sutures.  Then the hernia defect was closed using  multiple 0 Ethibond sutures, incorporating mesh into the figure of 8 sutures.  The cavity was irrigated and the wound was closed in multiple layers using 3-0 Vicryl and 4-0 Monocryl for the skin.  The incision was cleaned and sealed with DermaBond.  The patient was emerged from anesthesia and extubated and brought to the recovery room for further management.  The patient tolerated the procedure well and all counts were correct at the end of the case.   Melvyn Neth, MD

## 2019-10-09 NOTE — OR Nursing (Signed)
Dr. Hampton Abbot in to see pt postop 1700.

## 2019-10-09 NOTE — OR Nursing (Signed)
Discharge pending transportation 

## 2019-10-09 NOTE — Anesthesia Postprocedure Evaluation (Signed)
Anesthesia Post Note  Patient: Robin Chan  Procedure(s) Performed: HERNIA REPAIR INCISIONAL (N/A Abdomen) INSERTION OF MESH (N/A Abdomen)  Patient location during evaluation: PACU Anesthesia Type: General Level of consciousness: awake and alert Pain management: pain level controlled Vital Signs Assessment: post-procedure vital signs reviewed and stable Respiratory status: spontaneous breathing, nonlabored ventilation and respiratory function stable Cardiovascular status: blood pressure returned to baseline and stable Postop Assessment: no apparent nausea or vomiting Anesthetic complications: no     Last Vitals:  Vitals:   10/09/19 1636 10/09/19 1655  BP: 110/73 119/61  Pulse:  71  Resp:  16  Temp:    SpO2:  99%    Last Pain:  Vitals:   10/09/19 1655  TempSrc:   PainSc: 0-No pain                 Brett Canales Niti Leisure

## 2019-10-09 NOTE — Discharge Instructions (Signed)

## 2019-10-09 NOTE — Anesthesia Procedure Notes (Signed)
Procedure Name: Intubation Date/Time: 10/09/2019 1:00 PM Performed by: Nelda Marseille, CRNA Pre-anesthesia Checklist: Patient identified, Patient being monitored, Timeout performed, Emergency Drugs available and Suction available Patient Re-evaluated:Patient Re-evaluated prior to induction Oxygen Delivery Method: Circle system utilized Preoxygenation: Pre-oxygenation with 100% oxygen Induction Type: IV induction Ventilation: Mask ventilation without difficulty Laryngoscope Size: Mac, 3 and McGraph Grade View: Grade II Tube type: Oral Tube size: 7.0 mm Number of attempts: 1 Airway Equipment and Method: Stylet and Video-laryngoscopy Placement Confirmation: ETT inserted through vocal cords under direct vision,  positive ETCO2 and breath sounds checked- equal and bilateral Secured at: 21 cm Tube secured with: Tape Dental Injury: Teeth and Oropharynx as per pre-operative assessment

## 2019-10-09 NOTE — Interval H&P Note (Signed)
History and Physical Interval Note:  10/09/2019 12:31 PM  Robin Chan  has presented today for surgery, with the diagnosis of Incisional hernia.  The various methods of treatment have been discussed with the patient and family. After consideration of risks, benefits and other options for treatment, the patient has consented to  Procedure(s): HERNIA REPAIR INCISIONAL (N/A) INSERTION OF MESH (N/A) as a surgical intervention.  The patient's history has been reviewed, patient examined, no change in status, stable for surgery.  I have reviewed the patient's chart and labs.  Questions were answered to the patient's satisfaction.     Jocob Dambach

## 2019-10-09 NOTE — Transfer of Care (Signed)
Immediate Anesthesia Transfer of Care Note  Patient: Robin Chan  Procedure(s) Performed: HERNIA REPAIR INCISIONAL (N/A Abdomen) INSERTION OF MESH (N/A Abdomen)  Patient Location: PACU  Anesthesia Type:General  Level of Consciousness: drowsy  Airway & Oxygen Therapy: Patient Spontanous Breathing and Patient connected to face mask oxygen  Post-op Assessment: Report given to RN and Post -op Vital signs reviewed and stable  Post vital signs: Reviewed and stable  Last Vitals:  Vitals Value Taken Time  BP 102/68 10/09/19 1529  Temp 36.4 C 10/09/19 1529  Pulse 70 10/09/19 1530  Resp 13 10/09/19 1530  SpO2 99 % 10/09/19 1530  Vitals shown include unvalidated device data.  Last Pain:  Vitals:   10/09/19 1005  TempSrc: Temporal  PainSc: 8          Complications: No apparent anesthesia complications

## 2019-10-10 ENCOUNTER — Encounter: Payer: Self-pay | Admitting: Surgery

## 2019-10-11 ENCOUNTER — Encounter: Payer: Self-pay | Admitting: Surgery

## 2019-10-11 LAB — SURGICAL PATHOLOGY

## 2019-10-15 ENCOUNTER — Other Ambulatory Visit: Payer: Self-pay

## 2019-10-24 ENCOUNTER — Ambulatory Visit (INDEPENDENT_AMBULATORY_CARE_PROVIDER_SITE_OTHER): Payer: Self-pay | Admitting: Physician Assistant

## 2019-10-24 ENCOUNTER — Other Ambulatory Visit: Payer: Self-pay

## 2019-10-24 ENCOUNTER — Encounter: Payer: Self-pay | Admitting: Physician Assistant

## 2019-10-24 VITALS — BP 145/86 | HR 101 | Temp 97.7°F | Ht 69.5 in | Wt 199.2 lb

## 2019-10-24 DIAGNOSIS — Z09 Encounter for follow-up examination after completed treatment for conditions other than malignant neoplasm: Secondary | ICD-10-CM

## 2019-10-24 DIAGNOSIS — K432 Incisional hernia without obstruction or gangrene: Secondary | ICD-10-CM

## 2019-10-24 NOTE — Patient Instructions (Addendum)
Otho Ket, PA-C discussed with patient she may experience some tenderness in the area due to the scar tissue and the tissue forming to the mesh use to repair the hernia.  Patient is to refrain from any heavy lifting of 15-20 pounds or more for two more weeks.  Patient may take Tylenol or Ibuprofen to help with any pain or discomfort.  Patient may continue with showering and if notices glue is coming off, patient may remove the glue.  GENERAL POST-OPERATIVE PATIENT INSTRUCTIONS   FOLLOW-UP:  Please make an appointment with your physician in.  Call your physician immediately if you have any fevers greater than 102.5, drainage from you wound that is not clear or looks infected, persistent bleeding, increasing abdominal pain, problems urinating, or persistent nausea/vomiting.    WOUND CARE INSTRUCTIONS:  Keep a dry clean dressing on the wound if there is drainage. The initial bandage may be removed after 24 hours.  Once the wound has quit draining you may leave it open to air.  If clothing rubs against the wound or causes irritation and the wound is not draining you may cover it with a dry dressing during the daytime.  Try to keep the wound dry and avoid ointments on the wound unless directed to do so.  If the wound becomes bright red and painful or starts to drain infected material that is not clear, please contact your physician immediately.  If the wound is mildly pink and has a thick firm ridge underneath it, this is normal, and is referred to as a healing ridge.  This will resolve over the next 4-6 weeks.  DIET:  You may eat any foods that you can tolerate.  It is a good idea to eat a high fiber diet and take in plenty of fluids to prevent constipation.  If you do become constipated you may want to take a mild laxative or take ducolax tablets on a daily basis until your bowel habits are regular.  Constipation can be very uncomfortable, along with straining, after recent surgery.  ACTIVITY:  You are  encouraged to cough and deep breath or use your incentive spirometer if you were given one, every 15-30 minutes when awake.  This will help prevent respiratory complications and low grade fevers post-operatively if you had a general anesthetic.  You may want to hug a pillow when coughing and sneezing to add additional support to the surgical area, if you had abdominal or chest surgery, which will decrease pain during these times.  You are encouraged to walk and engage in light activity for the next two weeks.  You should not lift more than 20 pounds during this time frame as it could put you at increased risk for complications.  Twenty pounds is roughly equivalent to a plastic bag of groceries.    MEDICATIONS:  Try to take narcotic medications and anti-inflammatory medications, such as tylenol, ibuprofen, naprosyn, etc., with food.  This will minimize stomach upset from the medication.  Should you develop nausea and vomiting from the pain medication, or develop a rash, please discontinue the medication and contact your physician.  You should not drive, make important decisions, or operate machinery when taking narcotic pain medication.  QUESTIONS:  Please feel free to call your physician or the hospital operator if you have any questions, and they will be glad to assist you.

## 2019-10-24 NOTE — Progress Notes (Signed)
[facility] POST-OP OFFICE VISIT  10/24/2019  HPI: Robin Chan is a 49 y.o. female 15 days s/p open incisional hernia repair with mesh with Dr Hampton Abbot.   She is overall doing well She reports she has some umbilical discomfort, at the worst a 5/10, sometimes exacerbated with movement, controlled with ibuprofen and tylenol No fever, chills, nausea, or emesis Tolerating PO Has been following restrictions   Vital signs: BP (!) 145/86   Pulse (!) 101   Temp 97.7 F (36.5 C) (Temporal)   Ht 5' 9.5" (1.765 m)   Wt 199 lb 3.2 oz (90.4 kg)   LMP 05/19/2015   SpO2 98%   BMI 28.99 kg/m    Physical Exam: Constitutional: Well appearing female, NAD Abdomen: Soft, non-tender, non-distended, no rebound/guarding Skin: Incision is CDI, dermabond is peeling off, no erythema or drainage, there is palpable induration  Assessment/Plan: This is a 49 y.o. female  15 days s/p open incisional hernia repair with mesh    - Pain control prn; declined any stronger pain medications  - Reviewed lifting restrictions   - RTC PRN; she will call with questions/concerns  -- Edison Simon, PA-C [facility] 10/24/2019, 10:13 AM [phone] M-F: 7am - 4pm

## 2019-11-15 ENCOUNTER — Encounter: Payer: Self-pay | Admitting: Surgery

## 2019-11-26 ENCOUNTER — Other Ambulatory Visit: Payer: Self-pay

## 2019-11-26 ENCOUNTER — Encounter: Payer: Self-pay | Admitting: Family Medicine

## 2019-11-26 ENCOUNTER — Ambulatory Visit (INDEPENDENT_AMBULATORY_CARE_PROVIDER_SITE_OTHER): Payer: BC Managed Care – PPO | Admitting: Family Medicine

## 2019-11-26 VITALS — BP 120/80 | HR 106 | Temp 96.8°F | Resp 16 | Ht 69.5 in | Wt 200.4 lb

## 2019-11-26 DIAGNOSIS — F321 Major depressive disorder, single episode, moderate: Secondary | ICD-10-CM | POA: Diagnosis not present

## 2019-11-26 DIAGNOSIS — G35 Multiple sclerosis: Secondary | ICD-10-CM

## 2019-11-26 DIAGNOSIS — J302 Other seasonal allergic rhinitis: Secondary | ICD-10-CM

## 2019-11-26 DIAGNOSIS — F902 Attention-deficit hyperactivity disorder, combined type: Secondary | ICD-10-CM | POA: Diagnosis not present

## 2019-11-26 DIAGNOSIS — J4520 Mild intermittent asthma, uncomplicated: Secondary | ICD-10-CM | POA: Diagnosis not present

## 2019-11-26 DIAGNOSIS — D352 Benign neoplasm of pituitary gland: Secondary | ICD-10-CM

## 2019-11-26 DIAGNOSIS — J3089 Other allergic rhinitis: Secondary | ICD-10-CM

## 2019-11-26 MED ORDER — DULOXETINE HCL 30 MG PO CPEP: 90.0000 mg | ORAL_CAPSULE | Freq: Every day | ORAL | 1 refills | Status: DC

## 2019-11-26 MED ORDER — AMPHETAMINE-DEXTROAMPHET ER 20 MG PO CP24: 20.0000 mg | ORAL_CAPSULE | ORAL | 0 refills | Status: DC

## 2019-11-26 MED ORDER — MONTELUKAST SODIUM 10 MG PO TABS: 10.0000 mg | ORAL_TABLET | Freq: Every day | ORAL | 1 refills | Status: DC

## 2019-11-26 NOTE — Progress Notes (Signed)
Name: Robin Chan   MRN: [id number]    DOB: [birth year]   Date:11/26/2019       Progress Note  Subjective  Chief Complaint  Chief Complaint  Patient presents with  . Back Pain  . Prediabetes  . ADHD    HPI  Hernia: she had a exploratory laparotomy done 11/2018 for small bowel obstruction and was doing well, but developed some abdominal pain and bulging, she had the repair done 09/2019   ADD: taking medication daily as prescribed, on Adderal XR 20 mg now and she states she has been feeling well with medication Shestarted workingMarch 2019 helps with her focus. She usually takes one daily on work days, immediate release when off because she sleeps in . She states she is able to focus and able to do her work   MS: diagnosed in 2000, but first symptoms in 1991 - she had numbness  on the right side and weakness. Currently off medication because of multiple side effects. Has intermittent weakness and has used a cane periodically, for left foot drop - worse when she is tired and when it rains. Adderall helps with energy level. She still has left leg numbness intermittent - can last from hours to days. Full time work for [employer] she is more tired than usual this week because she was off work after incisional hernia repair surgery in March, but thinks she will get better soon.   Insomnia: she has been off Ambien, we stoppedit on theSpring 2018. She is taking Seroquel low dose qhs and helps her fall asleep ( takes about 10-20 minutes ), she is no longer having night terrors.   Major Depression: she was taking Lexapro for many years and depression was not controlled, we switched to Cymbalta in June 2017, She is separatedand divorce finalized Fall 2020.She [facility] She is working full time and thinks it is difficulty to take time off. Phq 9 is high today, she states she is broke because she was off work, and is tired this week since back  to work.   Asthma: she is taking singulairdaily now , she denies wheezing, cough or SOB at this timeShe states as long as she takes allergy medications her symptoms are controlled Unchanged   AR: she is forgetting to take flonase and has noticed more congestion, but regiment seems to help   Pituitary microadenoma: no significant change since 2011 -last one was 2019, diagnosed a mass possible Rathke's cleft cyst,last prolactin normalNo double vision, or nipple discharge. Unchanged   Patient Active Problem List   Diagnosis Date Noted  . Incisional hernia, without obstruction or gangrene   . Partial small bowel obstruction ([place]) 12/11/2018  . Pelvic pain 04/07/2017  . Medication management 04/07/2017  . Rectocele 07/08/2015  . Status post laparoscopic assisted vaginal hysterectomy ([facility]) 07/08/2015  . History of hysterectomy for benign disease 05/26/2015  . Left lumbar radiculitis 03/28/2015  . Breast cancer screening 02/05/2015  . Abnormal CAT scan 12/31/2014  . ADD (attention deficit disorder) 12/31/2014  . Allergic to bees 12/31/2014  . Back muscle spasm 12/31/2014  . Insomnia, persistent 12/31/2014  . Constipation 12/31/2014  . Depression, major, recurrent, mild ([place]) 12/31/2014  . Personal history of traumatic fracture 12/31/2014  . Asthma, mild intermittent 12/31/2014  . Migraine with aura and without status migrainosus, not intractable 12/31/2014  . Multiple sclerosis, relapsing-remitting ([place]) 12/31/2014  . Endometriosis determined by laparoscopy 12/31/2014  . Perennial allergic rhinitis 12/31/2014  .  Pituitary microadenoma ([place]) 12/31/2014  . Vitamin D deficiency 12/31/2014  . Acquired trigger finger 12/31/2014    Past Surgical History:  Procedure Laterality Date  . ABDOMINAL HYSTERECTOMY  05/26/2015  . BREAST BIOPSY Right 2005   benign  . INCISIONAL HERNIA REPAIR N/A 10/09/2019   Procedure: HERNIA REPAIR INCISIONAL;  Surgeon: Olean Ree, MD;  Location:  [facility] ORS;  Service: General;  Laterality: N/A;  . INSERTION OF MESH N/A 10/09/2019   Procedure: INSERTION OF MESH;  Surgeon: Olean Ree, MD;  Location: [facility] ORS;  Service: General;  Laterality: N/A;  . LAPAROSCOPIC ASSISTED VAGINAL HYSTERECTOMY N/A 05/26/2015   Procedure: LAPAROSCOPIC ASSISTED VAGINAL HYSTERECTOMY, with right salpingectomy;  Surgeon: Brayton Mars, MD;  Location: [facility] ORS;  Service: Gynecology;  Laterality: N/A;  . LAPAROTOMY N/A 12/14/2018   Procedure: EXPLORATORY LAPAROTOMY;  Surgeon: Olean Ree, MD;  Location: [facility] ORS;  Service: General;  Laterality: N/A;  . OVARY SURGERY Left 09/30/2014  . RECTOCELE REPAIR N/A 05/26/2015   Procedure: POSTERIOR REPAIR (RECTOCELE);  Surgeon: Brayton Mars, MD;  Location: [facility] ORS;  Service: Gynecology;  Laterality: N/A;    Family History  Problem Relation Age of Onset  . Anemia Mother   . Osteoporosis Mother   . Mental illness Father        Bipolar  . Arthritis Brother   . Breast cancer Maternal Aunt     Social History   Tobacco Use  . Smoking status: Current Some Day Smoker    Types: Cigarettes  . Smokeless tobacco: Never Used  Substance Use Topics  . Alcohol use: Yes    Alcohol/week: 14.0 standard drinks    Types: 14 Shots of liquor per week    Comment: occasionally, 1-2 shots every night     Current Outpatient Medications:  .  albuterol (VENTOLIN HFA) 108 (90 Base) MCG/ACT inhaler, Inhale 1 puff into the lungs every 6 (six) hours as needed for wheezing or shortness of breath. Reported on 09/26/2015 (Patient taking differently: Inhale 1 puff into the lungs every 6 (six) hours as needed for wheezing or shortness of breath. ), Disp: 24 g, Rfl: 0 .  amphetamine-dextroamphetamine (ADDERALL XR) 20 MG 24 hr capsule, Take 1 capsule (20 mg total) by mouth See admin instructions. Take days work, Disp: 90 capsule, Rfl: 0 .  amphetamine-dextroamphetamine (ADDERALL) 10 MG tablet, Take 1 tablet (10 mg total) by mouth  daily. On weekends (Patient taking differently: Take 10 mg by mouth See admin instructions. Take on off days), Disp: 30 tablet, Rfl: 0 .  cetirizine (ZYRTEC ALLERGY) 10 MG tablet, Take 10 mg by mouth at bedtime. , Disp: , Rfl:  .  Cholecalciferol (VITAMIN D3) 50 MCG (2000 UT) TABS, Take 2,000 Units by mouth daily., Disp: , Rfl:  .  Cyanocobalamin (VITAMIN B-12 PO), Take 1 tablet by mouth daily., Disp: , Rfl:  .  DULoxetine (CYMBALTA) 30 MG capsule, Take 3 capsules (90 mg total) by mouth at bedtime., Disp: 270 capsule, Rfl: 1 .  EPINEPHrine 0.3 mg/0.3 mL IJ SOAJ injection, Inject 0.3 mg into the muscle as needed for anaphylaxis. , Disp: , Rfl:  .  fluticasone (FLONASE) 50 MCG/ACT nasal spray, Place 2 sprays into both nostrils daily., Disp: 48 g, Rfl: 1 .  levocetirizine (XYZAL) 5 MG tablet, Take 1 tablet (5 mg total) by mouth daily., Disp: 90 tablet, Rfl: 1 .  montelukast (SINGULAIR) 10 MG tablet, Take 1 tablet (10 mg total) by mouth at bedtime., Disp: 90 tablet, Rfl: 1 .  QUEtiapine (SEROQUEL) 25 MG tablet, Take 1 tablet (25 mg total) by mouth at bedtime., Disp: 90 tablet, Rfl: 1  Allergies  Allergen Reactions  . Penicillins Anaphylaxis    Did it involve swelling of the face/tongue/throat, SOB, or low BP? Yes Did it involve sudden or severe rash/hives, skin peeling, or any reaction on the inside of your mouth or nose? No Did you need to seek medical attention at a hospital or doctor's office? Yes When did it last happen?A long time ago If all above answers are "NO", may proceed with cephalosporin use.  Raelyn Ensign Venom     Bee    I personally reviewed active problem list, medication list, allergies, family history, social history, health maintenance with the patient/caregiver today.   ROS  Constitutional: Negative for fever or weight change.  Respiratory: Negative for cough and shortness of breath.   Cardiovascular: Negative for chest pain or palpitations.  Gastrointestinal: Negative for  abdominal pain, no bowel changes.  Musculoskeletal: Negative for gait problem or joint swelling.  Skin: Negative for rash.  Neurological: Negative for dizziness or headache.  No other specific complaints in a complete review of systems (except as listed in HPI above).  Objective  Vitals:   11/26/19 0842  BP: 120/80  Pulse: (!) 106  Resp: 16  Temp: (!) 96.8 F (36 C)  TempSrc: Temporal  SpO2: 98%  Weight: 200 lb 6.4 oz (90.9 kg)  Height: 5' 9.5" (1.765 m)    Body mass index is 29.17 kg/m.  Physical Exam   Constitutional: Patient appears well-developed and well-nourished. Overweight.  No distress.  HEENT: head atraumatic, normocephalic, pupils equal and reactive to light Cardiovascular: Normal rate, regular rhythm and normal heart sounds.  No murmur heard. No BLE edema. Pulmonary/Chest: Effort normal and breath sounds normal. No respiratory distress. Abdominal: Soft.  There is no tenderness., incision from surgery healed  Psychiatric: Patient has a normal mood and affect. behavior is normal. Judgment and thought content normal.  Recent Results (from the past 2160 hour(s))  CBC     Status: Abnormal   Collection Time: 10/05/19  8:48 AM  Result Value Ref Range   WBC 7.6 4.0 - 10.5 K/uL   RBC 4.21 3.87 - 5.11 MIL/uL   Hemoglobin 12.7 12.0 - 15.0 g/dL   HCT 39.5 36.0 - 46.0 %   MCV 93.8 80.0 - 100.0 fL   MCH 30.2 26.0 - 34.0 pg   MCHC 32.2 30.0 - 36.0 g/dL   RDW 12.0 11.5 - 15.5 %   Platelets 493 (H) 150 - 400 K/uL   nRBC 0.0 0.0 - 0.2 %    Comment: Performed at [facility], [address] [place], [place] [address]  SARS CORONAVIRUS 2 (TAT 6-24 HRS) Nasopharyngeal Nasopharyngeal Swab     Status: None   Collection Time: 10/05/19  8:50 AM   Specimen: Nasopharyngeal Swab  Result Value Ref Range   SARS Coronavirus 2 NEGATIVE NEGATIVE    Comment: (NOTE) SARS-CoV-2 target nucleic acids are NOT DETECTED. The SARS-CoV-2 RNA is generally detectable in upper and  lower respiratory specimens during the acute phase of infection. Negative results do not preclude SARS-CoV-2 infection, do not rule out co-infections with other pathogens, and should not be used as the sole basis for treatment or other patient management decisions. Negative results must be combined with clinical observations, patient history, and epidemiological information. The expected result is Negative. Fact Sheet for Patients: SugarRoll.be Fact Sheet for Healthcare Providers: https://www.woods-mathews.com/ This test  is not yet approved or cleared by the [place] and  has been authorized for detection and/or diagnosis of SARS-CoV-2 by FDA under an Emergency Use Authorization (EUA). This EUA will remain  in effect (meaning this test can be used) for the duration of the COVID-19 declaration under Section 56 4(b)(1) of the Act, 21 U.S.C. section 360bbb-3(b)(1), unless the authorization is terminated or revoked sooner. Performed at [facility], [place] [address], [place], [place] [address]   Surgical pathology     Status: None   Collection Time: 10/09/19  1:48 PM  Result Value Ref Range   SURGICAL PATHOLOGY      SURGICAL PATHOLOGY CASE: [id number] PATIENT: Leafy Half Surgical Pathology Report     Specimen Submitted: A. Hernia sac  Clinical History: Incisional hernia    DIAGNOSIS: A. SOFT TISSUE, ABDOMINAL; EXCISION: - BENIGN FIBROMEMBRANOUS TISSUE, COMPATIBLE WITH HERNIA SAC. - NEGATIVE FOR MALIGNANCY.  GROSS DESCRIPTION: A. Labeled: Hernia sac Received: Formalin Tissue fragment(s): Multiple Size: Aggregate, 5.2 x 3.0 x 1.0 cm Description: Received are irregular fragments of tan-pink, fibromembranous soft tissue admixed with minimal adipose tissue and possible striated muscle.  No abnormalities or mass lesions are grossly identified.  Representative sections are submitted in cassette 1.     Final  Diagnosis performed by Allena Napoleon, MD.   Electronically signed 10/11/2019 9:52:32AM The electronic signature indicates that the named Attending Pathologist has evaluated the specimen Technical component performed at [facility], [address] [place], [place]  [address] Lab: [phone] Dir: Rush Farmer, MD, MMM  Professional component performed at [facility], [facility], [place], [place], [place] [address] Lab: [phone] Dir: Dellia Nims. Rubinas, MD      PHQ2/9: Depression screen [facility] 2/9 11/26/2019 08/28/2019 05/01/2019 01/29/2019 10/11/2018  Decreased Interest 1 2 0 0 0  Down, Depressed, Hopeless 2 2 0 0 0  PHQ - 2 Score 3 4 0 0 0  Altered sleeping 3 1 1 1 2   Tired, decreased energy 3 2 1 1 1   Change in appetite 3 3 2 1  0  Feeling bad or failure about yourself  0 2 0 0 0  Trouble concentrating 2 2 0 0 0  Moving slowly or fidgety/restless 0 0 0 0 0  Suicidal thoughts 0 0 0 0 0  PHQ-9 Score 14 14 4 3 3   Difficult doing work/chores Very difficult Somewhat difficult Not difficult at all Not difficult at all Not difficult at all  Some recent data might be hidden    phq 9 is positive   Fall Risk: Fall Risk  11/26/2019 10/24/2019 09/12/2019 05/01/2019 01/29/2019  Falls in the past year? 0 0 0 0 0  Number falls in past yr: 0 0 0 0 0  Injury with Fall? 0 0 0 0 0  Comment - - - - -  Risk Factor Category  - - - - -  Comment - - - - -  Risk for fall due to : - - - - -  Follow up - - - - -     Functional Status Survey: Is the patient deaf or have difficulty hearing?: No Does the patient have difficulty seeing, even when wearing glasses/contacts?: No Does the patient have difficulty concentrating, remembering, or making decisions?: No Does the patient have difficulty walking or climbing stairs?: No Does the patient have difficulty dressing or bathing?: No Does the patient have difficulty doing errands alone such as visiting a doctor's office or shopping?:  No  Assessment & Plan  1. Mild intermittent asthma without complication  - montelukast (SINGULAIR) 10 MG tablet; Take 1 tablet (10 mg total) by mouth at bedtime.  Dispense: 90 tablet; Refill: 1  2. Multiple sclerosis, relapsing-remitting (HCC)  - amphetamine-dextroamphetamine (ADDERALL XR) 20 MG 24 hr capsule; Take 1 capsule (20 mg total) by mouth See admin instructions. Take days work  Dispense: 90 capsule; Refill: 0  3. Attention deficit hyperactivity disorder (ADHD), combined type  - amphetamine-dextroamphetamine (ADDERALL XR) 20 MG 24 hr capsule; Take 1 capsule (20 mg total) by mouth See admin instructions. Take days work  Dispense: 90 capsule; Refill: 0  4. Moderate major depression (HCC)  - DULoxetine (CYMBALTA) 30 MG capsule; Take 3 capsules (90 mg total) by mouth at bedtime.  Dispense: 270 capsule; Refill: 1 - amphetamine-dextroamphetamine (ADDERALL XR) 20 MG 24 hr capsule; Take 1 capsule (20 mg total) by mouth See admin instructions. Take days work  Dispense: 90 capsule; Refill: 0  5. Pituitary microadenoma ([place])  Not having symptoms   6. Perennial allergic rhinitis with seasonal variation  Resume flonase daily

## 2019-12-20 ENCOUNTER — Encounter: Payer: Self-pay | Admitting: Family Medicine

## 2019-12-21 ENCOUNTER — Other Ambulatory Visit: Payer: Self-pay | Admitting: Family Medicine

## 2019-12-21 DIAGNOSIS — Z1211 Encounter for screening for malignant neoplasm of colon: Secondary | ICD-10-CM

## 2019-12-21 DIAGNOSIS — R911 Solitary pulmonary nodule: Secondary | ICD-10-CM

## 2019-12-21 DIAGNOSIS — D352 Benign neoplasm of pituitary gland: Secondary | ICD-10-CM

## 2019-12-21 DIAGNOSIS — G35 Multiple sclerosis: Secondary | ICD-10-CM

## 2019-12-24 ENCOUNTER — Other Ambulatory Visit: Payer: Self-pay | Admitting: Family Medicine

## 2019-12-24 DIAGNOSIS — R911 Solitary pulmonary nodule: Secondary | ICD-10-CM

## 2019-12-25 ENCOUNTER — Telehealth: Payer: Self-pay

## 2019-12-25 ENCOUNTER — Other Ambulatory Visit: Payer: Self-pay | Admitting: Family Medicine

## 2019-12-25 ENCOUNTER — Encounter: Payer: Self-pay | Admitting: Family Medicine

## 2019-12-25 DIAGNOSIS — R911 Solitary pulmonary nodule: Secondary | ICD-10-CM

## 2019-12-25 NOTE — Telephone Encounter (Signed)
I called this patient to inform her that she has been scheduled to have her CT on 01/01/2020 at 4pm at the [facility] & a MRI on 01/07/2020 at 6pm at [facility] but there was no answer. A message was left  fro her with this information ans asking her to arrive at least 15 mins early. I also gave her the number to Centralized Scheduling [phone]) in case the appt date and time is not sufficient for her.   A CRM will be placed.

## 2019-12-27 ENCOUNTER — Telehealth (INDEPENDENT_AMBULATORY_CARE_PROVIDER_SITE_OTHER): Payer: Self-pay | Admitting: Gastroenterology

## 2019-12-27 ENCOUNTER — Other Ambulatory Visit: Payer: Self-pay

## 2019-12-27 DIAGNOSIS — Z1211 Encounter for screening for malignant neoplasm of colon: Secondary | ICD-10-CM

## 2019-12-27 NOTE — Progress Notes (Signed)
Gastroenterology Pre-Procedure Review  Request Date: PENDING CALL BACK Requesting Physician: Dr. Dellie Catholic upon Scheduling  PATIENT REVIEW QUESTIONS: The patient responded to the following health history questions as indicated:    1. Are you having any GI issues? no 2. Do you have a personal history of Polyps? no 3. Do you have a family history of Colon Cancer or Polyps? no 4. Diabetes Mellitus? no 5. Joint replacements in the past 12 months?Hernia repair March 2021 6. Major health problems in the past 3 months?Hernia Repair March 2021 7. Any artificial heart valves, MVP, or defibrillator?no    MEDICATIONS & ALLERGIES:    Patient reports the following regarding taking any anticoagulation/antiplatelet therapy:   Plavix, Coumadin, Eliquis, Xarelto, Lovenox, Pradaxa, Brilinta, or Effient? no Aspirin? no  Patient confirms/reports the following medications:  Current Outpatient Medications  Medication Sig Dispense Refill  . albuterol (VENTOLIN HFA) 108 (90 Base) MCG/ACT inhaler Inhale 1 puff into the lungs every 6 (six) hours as needed for wheezing or shortness of breath. Reported on 09/26/2015 (Patient taking differently: Inhale 1 puff into the lungs every 6 (six) hours as needed for wheezing or shortness of breath. ) 24 g 0  . amphetamine-dextroamphetamine (ADDERALL XR) 20 MG 24 hr capsule Take 1 capsule (20 mg total) by mouth See admin instructions. Take days work 90 capsule 0  . amphetamine-dextroamphetamine (ADDERALL) 10 MG tablet Take 1 tablet (10 mg total) by mouth daily. On weekends (Patient taking differently: Take 10 mg by mouth See admin instructions. Take on off days) 30 tablet 0  . cetirizine (ZYRTEC ALLERGY) 10 MG tablet Take 10 mg by mouth at bedtime.     . Cholecalciferol (VITAMIN D3) 50 MCG (2000 UT) TABS Take 2,000 Units by mouth daily.    . Cyanocobalamin (VITAMIN B-12 PO) Take 1 tablet by mouth daily.    . DULoxetine (CYMBALTA) 30 MG capsule Take 3 capsules (90 mg total) by mouth  at bedtime. 270 capsule 1  . EPINEPHrine 0.3 mg/0.3 mL IJ SOAJ injection Inject 0.3 mg into the muscle as needed for anaphylaxis.     . fluticasone (FLONASE) 50 MCG/ACT nasal spray Place 2 sprays into both nostrils daily. 48 g 1  . levocetirizine (XYZAL) 5 MG tablet Take 1 tablet (5 mg total) by mouth daily. 90 tablet 1  . montelukast (SINGULAIR) 10 MG tablet Take 1 tablet (10 mg total) by mouth at bedtime. 90 tablet 1  . QUEtiapine (SEROQUEL) 25 MG tablet Take 1 tablet (25 mg total) by mouth at bedtime. 90 tablet 1   No current facility-administered medications for this visit.    Patient confirms/reports the following allergies:  Allergies  Allergen Reactions  . Penicillins Anaphylaxis    Did it involve swelling of the face/tongue/throat, SOB, or low BP? Yes Did it involve sudden or severe rash/hives, skin peeling, or any reaction on the inside of your mouth or nose? No Did you need to seek medical attention at a hospital or doctor's office? Yes When did it last happen?A long time ago If all above answers are "NO", may proceed with cephalosporin use.  . Bee Venom     Bee    No orders of the defined types were placed in this encounter.   AUTHORIZATION INFORMATION Primary Insurance: 1D#: Group #:  Secondary Insurance: 1D#: Group #:  SCHEDULE INFORMATION: Date: PENDING Time: Location:[facility]

## 2020-01-01 ENCOUNTER — Ambulatory Visit
Admission: RE | Admit: 2020-01-01 | Discharge: 2020-01-01 | Disposition: A | Discharge status: Home / Self Care | Payer: BC Managed Care – PPO | Source: Ambulatory Visit | Attending: Family Medicine | Admitting: Family Medicine

## 2020-01-01 ENCOUNTER — Other Ambulatory Visit: Payer: Self-pay

## 2020-01-01 DIAGNOSIS — J479 Bronchiectasis, uncomplicated: Secondary | ICD-10-CM | POA: Diagnosis not present

## 2020-01-01 DIAGNOSIS — J9811 Atelectasis: Secondary | ICD-10-CM | POA: Diagnosis not present

## 2020-01-01 DIAGNOSIS — R911 Solitary pulmonary nodule: Secondary | ICD-10-CM | POA: Diagnosis not present

## 2020-01-07 ENCOUNTER — Other Ambulatory Visit: Payer: Self-pay

## 2020-01-07 ENCOUNTER — Ambulatory Visit
Admission: RE | Admit: 2020-01-07 | Discharge: 2020-01-07 | Disposition: A | Discharge status: Home / Self Care | Payer: BC Managed Care – PPO | Source: Ambulatory Visit | Attending: Family Medicine | Admitting: Family Medicine

## 2020-01-07 DIAGNOSIS — G35 Multiple sclerosis: Secondary | ICD-10-CM | POA: Insufficient documentation

## 2020-01-07 DIAGNOSIS — D352 Benign neoplasm of pituitary gland: Secondary | ICD-10-CM | POA: Diagnosis not present

## 2020-01-07 MED ORDER — GADOBUTROL 1 MMOL/ML IV SOLN
9.0000 mL | Freq: Once | INTRAVENOUS | Status: AC | PRN
  Administered 2020-01-07: 9 mL via INTRAVENOUS

## 2020-01-08 ENCOUNTER — Encounter: Payer: Self-pay | Admitting: Family Medicine

## 2020-01-08 ENCOUNTER — Other Ambulatory Visit: Payer: Self-pay | Admitting: Family Medicine

## 2020-01-08 DIAGNOSIS — R911 Solitary pulmonary nodule: Secondary | ICD-10-CM

## 2020-02-28 ENCOUNTER — Other Ambulatory Visit: Payer: Self-pay | Admitting: Family Medicine

## 2020-02-28 DIAGNOSIS — G4709 Other insomnia: Secondary | ICD-10-CM

## 2020-02-28 DIAGNOSIS — J302 Other seasonal allergic rhinitis: Secondary | ICD-10-CM

## 2020-02-28 NOTE — Telephone Encounter (Signed)
Requested medication (s) are due for refill today:   Yes for both medications  Requested medication (s) are on the active medication list:   Yes for both  Future visit scheduled:   Yes on 03/05/2020 with Dr, Ancil Boozer   Last ordered: Both 09/04/2019 #90, RF 1  Returned because has non delegated refill.   Requested Prescriptions  Pending Prescriptions Disp Refills   levocetirizine (XYZAL) 5 MG tablet [Pharmacy Med Name: LEVOCETIRIZINE 5MG  TABLETS] 90 tablet 1    Sig: TAKE 1 TABLET(5 MG) BY MOUTH DAILY      Ear, Nose, and Throat:  Antihistamines Passed - 02/28/2020  3:28 AM      Passed - Valid encounter within last 12 months    Recent Outpatient Visits           3 months ago Multiple sclerosis, relapsing-remitting [facility])   [facility] [place], Drue Stager, MD   6 months ago Multiple sclerosis, relapsing-remitting [facility])   [facility] [place], Drue Stager, MD   10 months ago Multiple sclerosis, relapsing-remitting [facility])   [facility] Steele Sizer, MD   1 year ago Vitamin D deficiency   [facility] Steele Sizer, MD   1 year ago Multiple sclerosis, relapsing-remitting [facility])   [facility] Steele Sizer, MD       Future Appointments             In 6 days Steele Sizer, MD [facility]              QUEtiapine (SEROQUEL) 25 MG tablet [Pharmacy Med Name: QUETIAPINE 25MG  TABLETS] 90 tablet 1    Sig: TAKE 1 TABLET(25 MG) BY MOUTH AT BEDTIME      Not Delegated - Psychiatry:  Antipsychotics - Second Generation (Atypical) - quetiapine Failed - 02/28/2020  3:28 AM      Failed - This refill cannot be delegated      Failed - ALT in normal range and within 180 days    ALT  Date Value Ref Range Status  02/02/2019 11 0 - 32 IU/L Final          Failed - AST in normal range and within 180 days    AST  Date Value Ref Range Status  02/02/2019 12 0 - 40 IU/L Final           Passed - Completed PHQ-2 or PHQ-9 in the last 360 days.      Passed - Last BP in normal range    BP Readings from Last 1 Encounters:  11/26/19 120/80          Passed - Valid encounter within last 6 months    Recent Outpatient Visits           3 months ago Multiple sclerosis, relapsing-remitting [facility])   [facility] Steele Sizer, MD   6 months ago Multiple sclerosis, relapsing-remitting [facility])   [facility] Steele Sizer, MD   10 months ago Multiple sclerosis, relapsing-remitting [facility])   [facility] Steele Sizer, MD   1 year ago Vitamin D deficiency   [facility] Steele Sizer, MD   1 year ago Multiple sclerosis, relapsing-remitting [facility])   [facility] Steele Sizer, MD       Future Appointments             In 6 days Steele Sizer, MD [facility]

## 2020-03-05 ENCOUNTER — Encounter: Payer: Self-pay | Admitting: Family Medicine

## 2020-03-05 ENCOUNTER — Telehealth: Payer: Self-pay | Admitting: Family Medicine

## 2020-03-05 ENCOUNTER — Other Ambulatory Visit: Payer: Self-pay

## 2020-03-05 ENCOUNTER — Ambulatory Visit: Payer: BC Managed Care – PPO | Admitting: Family Medicine

## 2020-03-05 VITALS — BP 120/80 | HR 107 | Temp 98.0°F | Resp 16 | Ht 69.5 in | Wt 196.4 lb

## 2020-03-05 DIAGNOSIS — Z79899 Other long term (current) drug therapy: Secondary | ICD-10-CM | POA: Diagnosis not present

## 2020-03-05 DIAGNOSIS — D352 Benign neoplasm of pituitary gland: Secondary | ICD-10-CM

## 2020-03-05 DIAGNOSIS — J4520 Mild intermittent asthma, uncomplicated: Secondary | ICD-10-CM

## 2020-03-05 DIAGNOSIS — D75839 Thrombocytosis, unspecified: Secondary | ICD-10-CM

## 2020-03-05 DIAGNOSIS — G35 Multiple sclerosis: Secondary | ICD-10-CM | POA: Diagnosis not present

## 2020-03-05 DIAGNOSIS — J3089 Other allergic rhinitis: Secondary | ICD-10-CM

## 2020-03-05 DIAGNOSIS — D473 Essential (hemorrhagic) thrombocythemia: Secondary | ICD-10-CM

## 2020-03-05 DIAGNOSIS — F902 Attention-deficit hyperactivity disorder, combined type: Secondary | ICD-10-CM

## 2020-03-05 DIAGNOSIS — R7303 Prediabetes: Secondary | ICD-10-CM | POA: Diagnosis not present

## 2020-03-05 DIAGNOSIS — E559 Vitamin D deficiency, unspecified: Secondary | ICD-10-CM

## 2020-03-05 DIAGNOSIS — E785 Hyperlipidemia, unspecified: Secondary | ICD-10-CM | POA: Diagnosis not present

## 2020-03-05 DIAGNOSIS — E538 Deficiency of other specified B group vitamins: Secondary | ICD-10-CM

## 2020-03-05 DIAGNOSIS — F321 Major depressive disorder, single episode, moderate: Secondary | ICD-10-CM

## 2020-03-05 DIAGNOSIS — J302 Other seasonal allergic rhinitis: Secondary | ICD-10-CM

## 2020-03-05 MED ORDER — AMPHETAMINE-DEXTROAMPHET ER 20 MG PO CP24: 20.0000 mg | ORAL_CAPSULE | ORAL | 0 refills | Status: DC

## 2020-03-05 MED ORDER — AMPHETAMINE-DEXTROAMPHETAMINE 10 MG PO TABS: 10.0000 mg | ORAL_TABLET | Freq: Every day | ORAL | 0 refills | Status: DC

## 2020-03-05 NOTE — Progress Notes (Signed)
Name: Robin Chan   MRN: [id number]    DOB: 1970-12-29   Date:03/05/2020       Progress Note  Subjective  Chief Complaint  Chief Complaint  Patient presents with  . Multiple Sclerosis  . Asthma  . Migraine  . ADD  . Depression    HPI  Hernia: she had a exploratory laparotomy done 11/2018 for small bowel obstruction and was doing well, but developed some abdominal pain and bulging, she had the repair done 09/2019 and not problems since   ADD: taking medication daily as prescribed, on Adderal XR 20 mg now and she states she has been feeling well with medication , it helps her stay focused at work. She skips immediate release when off work . No side effects of medication. She is aware it is a controlled medication and cannot refill if she loses it   MS: diagnosed in 2000, but first symptoms in 1991 - she had numbness  on the right side and weakness. Currently off medication because of multiple side effects. Has intermittent weakness and has used a cane periodically, for left foot drop. Adderall helps with energy level. She still has left leg numbness intermittent - can last from hours to days. She is currently working full time [employer] . We repeated her MRI brain and plaques are stable. She has noticed some tingling on her fingers lately, usually happens during the hot months  Insomnia: she has been off Ambien, we stoppedit on theSpring 2018. She is taking Seroquel low dose qhs and helps her fall asleep ( takes about 10-20 minutes ), she is no longer having night terrors, she wants to continue medication .   Major Depression: she was taking Lexapro for many years and depression was not controlled, we switched to Cymbalta in June 2017, She is separatedand divorcefinalized Fall 2020.[facility]sto[facility] She is taking medications. She is under more stress because her boyfriend had a DUI and also had to bail him out from [place] jail because of  another problem  Asthma: she is taking singulairdaily now , she denies wheezing, cough or SOB at this timeShe states as long as she takes allergy medications her symptoms are controlled She is having to pay cash for her medications ( xyzla or zyrtec ) she will let me know if she would like a rx  AR: she is using Flonase and is doing well, however this past week had an allergic reaction at work, with angioedema ( eye lids ) she did not use her Epipen, but took two benadryl tablets and felt better within an half an hour. She denied associated sob or wheezing. She states it was triggered by mothballs.   Pituitary microadenoma: no significant change since 2011 -last one was 2019, diagnosed a mass possible Rathke's cleft cyst,last prolactin normalNo double vision, or nipple discharge.Unchanged    Patient Active Problem List   Diagnosis Date Noted  . Incisional hernia, without obstruction or gangrene   . Partial small bowel obstruction ([place]) 12/11/2018  . Pelvic pain 04/07/2017  . Medication management 04/07/2017  . Rectocele 07/08/2015  . Status post laparoscopic assisted vaginal hysterectomy ([facility]) 07/08/2015  . History of hysterectomy for benign disease 05/26/2015  . Left lumbar radiculitis 03/28/2015  . Breast cancer screening 02/05/2015  . Abnormal CAT scan 12/31/2014  . ADD (attention deficit disorder) 12/31/2014  . Allergic to bees 12/31/2014  . Back muscle spasm 12/31/2014  . Insomnia, persistent 12/31/2014  . Constipation 12/31/2014  .  Depression, major, recurrent, mild ([place]) 12/31/2014  . Personal history of traumatic fracture 12/31/2014  . Asthma, mild intermittent 12/31/2014  . Migraine with aura and without status migrainosus, not intractable 12/31/2014  . Multiple sclerosis, relapsing-remitting ([place]) 12/31/2014  . Endometriosis determined by laparoscopy 12/31/2014  . Perennial allergic rhinitis 12/31/2014  . Pituitary microadenoma ([place]) 12/31/2014  . Vitamin D  deficiency 12/31/2014  . Acquired trigger finger 12/31/2014    Past Surgical History:  Procedure Laterality Date  . ABDOMINAL HYSTERECTOMY  05/26/2015  . BREAST BIOPSY Right 2005   benign  . INCISIONAL HERNIA REPAIR N/A 10/09/2019   Procedure: HERNIA REPAIR INCISIONAL;  Surgeon: Olean Ree, MD;  Location: [facility] ORS;  Service: General;  Laterality: N/A;  . INSERTION OF MESH N/A 10/09/2019   Procedure: INSERTION OF MESH;  Surgeon: Olean Ree, MD;  Location: [facility] ORS;  Service: General;  Laterality: N/A;  . LAPAROSCOPIC ASSISTED VAGINAL HYSTERECTOMY N/A 05/26/2015   Procedure: LAPAROSCOPIC ASSISTED VAGINAL HYSTERECTOMY, with right salpingectomy;  Surgeon: Brayton Mars, MD;  Location: [facility] ORS;  Service: Gynecology;  Laterality: N/A;  . LAPAROTOMY N/A 12/14/2018   Procedure: EXPLORATORY LAPAROTOMY;  Surgeon: Olean Ree, MD;  Location: [facility] ORS;  Service: General;  Laterality: N/A;  . OVARY SURGERY Left 09/30/2014  . RECTOCELE REPAIR N/A 05/26/2015   Procedure: POSTERIOR REPAIR (RECTOCELE);  Surgeon: Brayton Mars, MD;  Location: [facility] ORS;  Service: Gynecology;  Laterality: N/A;    Family History  Problem Relation Age of Onset  . Anemia Mother   . Osteoporosis Mother   . Mental illness Father        Bipolar  . Arthritis Brother   . Breast cancer Maternal Aunt     Social History   Tobacco Use  . Smoking status: Current Some Day Smoker    Types: Cigarettes  . Smokeless tobacco: Never Used  Substance Use Topics  . Alcohol use: Yes    Alcohol/week: 14.0 standard drinks    Types: 14 Shots of liquor per week    Comment: occasionally, 1-2 shots every night     Current Outpatient Medications:  .  albuterol (VENTOLIN HFA) 108 (90 Base) MCG/ACT inhaler, Inhale 1 puff into the lungs every 6 (six) hours as needed for wheezing or shortness of breath. Reported on 09/26/2015 (Patient taking differently: Inhale 1 puff into the lungs every 6 (six) hours as needed for  wheezing or shortness of breath. ), Disp: 24 g, Rfl: 0 .  amphetamine-dextroamphetamine (ADDERALL XR) 20 MG 24 hr capsule, Take 1 capsule (20 mg total) by mouth See admin instructions. Take days work, Disp: 90 capsule, Rfl: 0 .  amphetamine-dextroamphetamine (ADDERALL) 10 MG tablet, Take 1 tablet (10 mg total) by mouth daily. On weekends (Patient taking differently: Take 10 mg by mouth See admin instructions. Take on off days), Disp: 30 tablet, Rfl: 0 .  cetirizine (ZYRTEC ALLERGY) 10 MG tablet, Take 10 mg by mouth at bedtime. , Disp: , Rfl:  .  Cholecalciferol (VITAMIN D3) 50 MCG (2000 UT) TABS, Take 2,000 Units by mouth daily., Disp: , Rfl:  .  Cyanocobalamin (VITAMIN B-12 PO), Take 1 tablet by mouth daily., Disp: , Rfl:  .  DULoxetine (CYMBALTA) 30 MG capsule, Take 3 capsules (90 mg total) by mouth at bedtime., Disp: 270 capsule, Rfl: 1 .  EPINEPHrine 0.3 mg/0.3 mL IJ SOAJ injection, Inject 0.3 mg into the muscle as needed for anaphylaxis. , Disp: , Rfl:  .  fluticasone (FLONASE) 50 MCG/ACT nasal spray, Place 2  sprays into both nostrils daily., Disp: 48 g, Rfl: 1 .  levocetirizine (XYZAL) 5 MG tablet, TAKE 1 TABLET(5 MG) BY MOUTH DAILY, Disp: 90 tablet, Rfl: 1 .  montelukast (SINGULAIR) 10 MG tablet, Take 1 tablet (10 mg total) by mouth at bedtime., Disp: 90 tablet, Rfl: 1 .  QUEtiapine (SEROQUEL) 25 MG tablet, TAKE 1 TABLET(25 MG) BY MOUTH AT BEDTIME, Disp: 90 tablet, Rfl: 1  Allergies  Allergen Reactions  . Penicillins Anaphylaxis    Did it involve swelling of the face/tongue/throat, SOB, or low BP? Yes Did it involve sudden or severe rash/hives, skin peeling, or any reaction on the inside of your mouth or nose? No Did you need to seek medical attention at a hospital or doctor's office? Yes When did it last happen?A long time ago If all above answers are "NO", may proceed with cephalosporin use.  Raelyn Ensign Venom     Bee    I personally reviewed active problem list, medication list,  allergies, family history, social history, health maintenance with the patient/caregiver today.   ROS  Constitutional: Negative for fever or weight change.  Respiratory: Negative for cough and shortness of breath.   Cardiovascular: Negative for chest pain or palpitations.  Gastrointestinal: Negative for abdominal pain, no bowel changes.  Musculoskeletal: Negative for gait problem ( intermittently )  or joint swelling.  Skin: Negative for rash.  Neurological: Negative for dizziness or headache.  No other specific complaints in a complete review of systems (except as listed in HPI above).  Objective  Vitals:   03/05/20 0937  BP: 120/80  Pulse: (!) 107  Resp: 16  Temp: 98 F (36.7 C)  TempSrc: Oral  SpO2: 98%  Weight: 196 lb 6.4 oz (89.1 kg)  Height: 5' 9.5" (1.765 m)    Body mass index is 28.59 kg/m.  Physical Exam  Constitutional: Patient appears well-developed and well-nourished. Overweight. No distress.  HEENT: head atraumatic, normocephalic, pupils equal and reactive to light, neck supple Cardiovascular: Normal rate, regular rhythm and normal heart sounds.  No murmur heard. No BLE edema. Pulmonary/Chest: Effort normal and breath sounds normal. No respiratory distress. Neurological: some dysarthria today  Abdominal: Soft.  There is no tenderness. Psychiatric: Patient has a normal mood and affect. behavior is normal. Judgment and thought content normal.   PHQ2/9: Depression screen [facility] 2/9 03/05/2020 11/26/2019 08/28/2019 05/01/2019 01/29/2019  Decreased Interest 1 1 2  0 0  Down, Depressed, Hopeless 1 2 2  0 0  PHQ - 2 Score 2 3 4  0 0  Altered sleeping 0 3 1 1 1   Tired, decreased energy 2 3 2 1 1   Change in appetite 2 3 3 2 1   Feeling bad or failure about yourself  0 0 2 0 0  Trouble concentrating 1 2 2  0 0  Moving slowly or fidgety/restless 2 0 0 0 0  Suicidal thoughts 0 0 0 0 0  PHQ-9 Score 9 14 14 4 3   Difficult doing work/chores Somewhat difficult Very difficult  Somewhat difficult Not difficult at all Not difficult at all  Some recent data might be hidden    phq 9 is positive   Fall Risk: Fall Risk  03/05/2020 11/26/2019 10/24/2019 09/12/2019 05/01/2019  Falls in the past year? 0 0 0 0 0  Number falls in past yr: 0 0 0 0 0  Injury with Fall? 0 0 0 0 0  Comment - - - - -  Risk Factor Category  - - - - -  Comment - - - - -  Risk for fall due to : - - - - -  Follow up - - - - -    Functional Status Survey: Is the patient deaf or have difficulty hearing?: No Does the patient have difficulty seeing, even when wearing glasses/contacts?: No Does the patient have difficulty concentrating, remembering, or making decisions?: Yes Does the patient have difficulty walking or climbing stairs?: Yes Does the patient have difficulty dressing or bathing?: No Does the patient have difficulty doing errands alone such as visiting a doctor's office or shopping?: No    Assessment & Plan  1. Thrombocytosis (HCC)  - CBC with Differential/Platelet  2. Multiple sclerosis, relapsing-remitting (HCC)  - amphetamine-dextroamphetamine (ADDERALL XR) 20 MG 24 hr capsule; Take 1 capsule (20 mg total) by mouth See admin instructions.  Dispense: 90 capsule; Refill: 0  3. Pituitary microadenoma (HCC)  - Prolactin  4. Attention deficit hyperactivity disorder (ADHD), combined type  - amphetamine-dextroamphetamine (ADDERALL XR) 20 MG 24 hr capsule; Take 1 capsule (20 mg total) by mouth See admin instructions.  Dispense: 90 capsule; Refill: 0 - amphetamine-dextroamphetamine (ADDERALL) 10 MG tablet; Take 1 tablet (10 mg total) by mouth daily. On weekends  Dispense: 30 tablet; Refill: 0  5. Vitamin D deficiency  Continue supplementation   6. B12 deficiency  Continue supplementation   7. Pre-diabetes  - Hemoglobin A1c  8. Perennial allergic rhinitis with seasonal variation  Continue medication  9. Mild intermittent asthma without complication  stable  10.  Moderate major depression (HCC)  - amphetamine-dextroamphetamine (ADDERALL XR) 20 MG 24 hr capsule; Take 1 capsule (20 mg total) by mouth See admin instructions.  Dispense: 90 capsule; Refill: 0  11. Dyslipidemia  - Lipid panel  12. Long-term use of high-risk medication  - COMPLETE METABOLIC PANEL WITH GFR

## 2020-03-05 NOTE — Telephone Encounter (Signed)
Robin Chan with [facility] would like clarification on the rx's sent this am.  The amphetamine-dextroamphetamine (ADDERALL) 10 MG tablet Is this only on weekends? Instructions say "Take 1 tablet (10 mg total) by mouth daily. On weekends, Starting Wed 03/05/2020"  The amphetamine-dextroamphetamine (ADDERALL XR) 20 MG 24 hr capsule Take 1 capsule (20 mg total) by mouth See admin instructions  He says he has no instructions.  Please call [name] at  [place], [place] - [address] Phone:  [phone]  Fax:  [phone]

## 2020-03-06 LAB — CBC WITH DIFFERENTIAL/PLATELET
Absolute Monocytes: 437 cells/uL (ref 200–950)
Basophils Absolute: 47 cells/uL (ref 0–200)
Basophils Relative: 0.8 %
Eosinophils Absolute: 142 cells/uL (ref 15–500)
Eosinophils Relative: 2.4 %
HCT: 38.8 % (ref 35.0–45.0)
Hemoglobin: 12.8 g/dL (ref 11.7–15.5)
Lymphs Abs: 2071 cells/uL (ref 850–3900)
MCH: 30.7 pg (ref 27.0–33.0)
MCHC: 33 g/dL (ref 32.0–36.0)
MCV: 93 fL (ref 80.0–100.0)
MPV: 9.6 fL (ref 7.5–12.5)
Monocytes Relative: 7.4 %
Neutro Abs: 3204 cells/uL (ref 1500–7800)
Neutrophils Relative %: 54.3 %
Platelets: 428 10*3/uL — ABNORMAL HIGH (ref 140–400)
RBC: 4.17 10*6/uL (ref 3.80–5.10)
RDW: 11.9 % (ref 11.0–15.0)
Total Lymphocyte: 35.1 %
WBC: 5.9 10*3/uL (ref 3.8–10.8)

## 2020-03-06 LAB — COMPLETE METABOLIC PANEL WITH GFR
AG Ratio: 1.7 (calc) (ref 1.0–2.5)
ALT: 13 U/L (ref 6–29)
AST: 14 U/L (ref 10–35)
Albumin: 4.3 g/dL (ref 3.6–5.1)
Alkaline phosphatase (APISO): 59 U/L (ref 31–125)
BUN: 17 mg/dL (ref 7–25)
CO2: 29 mmol/L (ref 20–32)
Calcium: 9.5 mg/dL (ref 8.6–10.2)
Chloride: 103 mmol/L (ref 98–110)
Creat: 0.98 mg/dL (ref 0.50–1.10)
GFR, Est African American: 79 mL/min/{1.73_m2} (ref >=60)
GFR, Est Non African American: 68 mL/min/{1.73_m2} (ref >=60)
Globulin: 2.6 g/dL (calc) (ref 1.9–3.7)
Glucose, Bld: 102 mg/dL — ABNORMAL HIGH (ref 65–99)
Potassium: 5 mmol/L (ref 3.5–5.3)
Sodium: 138 mmol/L (ref 135–146)
Total Bilirubin: 0.8 mg/dL (ref 0.2–1.2)
Total Protein: 6.9 g/dL (ref 6.1–8.1)

## 2020-03-06 LAB — HEMOGLOBIN A1C
Hgb A1c MFr Bld: 5.6 % of total Hgb (ref <5.7)
Mean Plasma Glucose: 114 (calc)
eAG (mmol/L): 6.3 (calc)

## 2020-03-06 LAB — LIPID PANEL
Cholesterol: 190 mg/dL (ref <200)
HDL: 53 mg/dL (ref >=50)
LDL Cholesterol (Calc): 117 mg/dL (calc) — ABNORMAL HIGH
Non-HDL Cholesterol (Calc): 137 mg/dL (calc) — ABNORMAL HIGH (ref <130)
Total CHOL/HDL Ratio: 3.6 (calc) (ref <5.0)
Triglycerides: 96 mg/dL (ref <150)

## 2020-03-06 LAB — PROLACTIN: Prolactin: 10.7 ng/mL

## 2020-03-07 NOTE — Telephone Encounter (Signed)
Pharmacy has been notified.

## 2020-05-26 ENCOUNTER — Other Ambulatory Visit: Payer: Self-pay | Admitting: Family Medicine

## 2020-05-26 DIAGNOSIS — F321 Major depressive disorder, single episode, moderate: Secondary | ICD-10-CM

## 2020-05-26 DIAGNOSIS — J4520 Mild intermittent asthma, uncomplicated: Secondary | ICD-10-CM

## 2020-05-26 NOTE — Telephone Encounter (Signed)
Approved per protocol.  

## 2020-06-03 NOTE — Progress Notes (Signed)
Name: Robin Chan   MRN: [id number]    DOB: [birth year]   Date:06/05/2020       Progress Note  Subjective  Chief Complaint  Follow up  HPI    ADD: taking medication daily as prescribed, on Adderal XR 20 mg now and she states she has been feeling well with medication , it helps her stay focused at work. She skips immediate release when off work . She misplaced the bottle and has only been taking immediate release for the past few weeks, difficulty focusing at work without medication.   MS: diagnosed in 2000, but first symptoms in 1991 - she had numbness  on the right side and weakness. Currently off medication because of multiple side effects. Has intermittent weakness and has used a cane periodically, for left foot drop. Adderall helps with energy level. She still has left leg numbness intermittent - can last from hours to days. She is currently working full time [employer] . We repeated her MRI brain and plaques are stable. She states she has noticed a waterfall sensation down right thigh towards right knee Tingling on her fingers is intermittent now   Insomnia: she has been off Ambien, we stoppedit on theSpring 2018. She is taking Seroquel low dose qhs and helps her fall asleep ( takes about 10-20 minutes ), she is no longer having night terrors, she likes medication  Unchanged   Major Depression: she was taking Lexapro for many years and depression was not controlled, we switched to Cymbalta in June 2017, She is separatedand divorcefinalized Fall 2020.Shesto[facility] She is taking medications. She is under more stress because her boyfriend had another DUI  he is also not working with his brother because they had a TEFL teacher.   Asthma: she is taking singulairdaily now , she denies wheezing, cough or SOB at this timeShe states as long as she takes allergy medications her symptoms are controlled Unchanged   AR: she is using Flonase  and is doing well, she had a reaction at work with moth balls but is doing well now   Pre-diabetes: denies polyphagia, polydipsia or polyuria  Pituitary microadenoma: no nipple discharge or double vision   IMPRESSION: 1. Unchanged distribution of white matter lesions consistent with multiple sclerosis. No active demyelinating lesions. 2. 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  Patient Active Problem List   Diagnosis Date Noted  . Incisional hernia, without obstruction or gangrene   . Partial small bowel obstruction ([place]) 12/11/2018  . Pelvic pain 04/07/2017  . Medication management 04/07/2017  . Rectocele 07/08/2015  . Status post laparoscopic assisted vaginal hysterectomy ([facility]) 07/08/2015  . History of hysterectomy for benign disease 05/26/2015  . Left lumbar radiculitis 03/28/2015  . Breast cancer screening 02/05/2015  . Abnormal CAT scan 12/31/2014  . ADD (attention deficit disorder) 12/31/2014  . Allergic to bees 12/31/2014  . Back muscle spasm 12/31/2014  . Insomnia, persistent 12/31/2014  . Constipation 12/31/2014  . Depression, major, recurrent, mild ([place]) 12/31/2014  . Personal history of traumatic fracture 12/31/2014  . Asthma, mild intermittent 12/31/2014  . Migraine with aura and without status migrainosus, not intractable 12/31/2014  . Multiple sclerosis, relapsing-remitting ([place]) 12/31/2014  . Endometriosis determined by laparoscopy 12/31/2014  . Perennial allergic rhinitis 12/31/2014  . Pituitary microadenoma ([place]) 12/31/2014  . Vitamin D deficiency 12/31/2014  . Acquired trigger finger 12/31/2014    Past Surgical History:  Procedure Laterality Date  . ABDOMINAL HYSTERECTOMY  05/26/2015  . BREAST BIOPSY Right 2005   benign  . INCISIONAL HERNIA REPAIR N/A 10/09/2019   Procedure: HERNIA REPAIR INCISIONAL;  Surgeon: Olean Ree, MD;  Location: [facility] ORS;  Service: General;  Laterality:  N/A;  . INSERTION OF MESH N/A 10/09/2019   Procedure: INSERTION OF MESH;  Surgeon: Olean Ree, MD;  Location: [facility] ORS;  Service: General;  Laterality: N/A;  . LAPAROSCOPIC ASSISTED VAGINAL HYSTERECTOMY N/A 05/26/2015   Procedure: LAPAROSCOPIC ASSISTED VAGINAL HYSTERECTOMY, with right salpingectomy;  Surgeon: Brayton Mars, MD;  Location: [facility] ORS;  Service: Gynecology;  Laterality: N/A;  . LAPAROTOMY N/A 12/14/2018   Procedure: EXPLORATORY LAPAROTOMY;  Surgeon: Olean Ree, MD;  Location: [facility] ORS;  Service: General;  Laterality: N/A;  . OVARY SURGERY Left 09/30/2014  . RECTOCELE REPAIR N/A 05/26/2015   Procedure: POSTERIOR REPAIR (RECTOCELE);  Surgeon: Brayton Mars, MD;  Location: [facility] OR[facility];  Service: Gynecology;  Laterality: N/A;    Family History  Problem Relation Age of Onset  . Anemia Mother   . Osteoporosis Mother   . Mental illness Father        Bipolar  . Arthritis Brother   . Breast cancer Maternal Aunt     Social History   Tobacco Use  . Smoking status: Current Some Day Smoker    Types: Cigarettes  . Smokeless tobacco: Never Used  Substance Use Topics  . Alcohol use: Yes    Alcohol/week: 14.0 standard drinks    Types: 14 Shots of liquor per week    Comment: occasionally, 1-2 shots every night     Current Outpatient Medications:  .  albuterol (VENTOLIN HFA) 108 (90 Base) MCG/ACT inhaler, Inhale 1 puff into the lungs every 6 (six) hours as needed for wheezing or shortness of breath. Reported on 09/26/2015 (Patient taking differently: Inhale 1 puff into the lungs every 6 (six) hours as needed for wheezing or shortness of breath. ), Disp: 24 g, Rfl: 0 .  amphetamine-dextroamphetamine (ADDERALL XR) 20 MG 24 hr capsule, Take 1 capsule (20 mg total) by mouth See admin instructions., Disp: 90 capsule, Rfl: 0 .  amphetamine-dextroamphetamine (ADDERALL) 10 MG tablet, Take 1 tablet (10 mg total) by mouth daily. On weekends, Disp: 30 tablet, Rfl: 0 .  cetirizine  (ZYRTEC ALLERGY) 10 MG tablet, Take 10 mg by mouth at bedtime. , Disp: , Rfl:  .  Cholecalciferol (VITAMIN D3) 50 MCG (2000 UT) TABS, Take 2,000 Units by mouth daily., Disp: , Rfl:  .  Cyanocobalamin (VITAMIN B-12 PO), Take 1 tablet by mouth daily., Disp: , Rfl:  .  DULoxetine (CYMBALTA) 30 MG capsule, TAKE 3 CAPSULES BY MOUTH AT BEDTIME, Disp: 270 capsule, Rfl: 1 .  EPINEPHrine 0.3 mg/0.3 mL IJ SOAJ injection, Inject 0.3 mg into the muscle as needed for anaphylaxis. , Disp: , Rfl:  .  fluticasone (FLONASE) 50 MCG/ACT nasal spray, Place 2 sprays into both nostrils daily., Disp: 48 g, Rfl: 1 .  levocetirizine (XYZAL) 5 MG tablet, TAKE 1 TABLET(5 MG) BY MOUTH DAILY, Disp: 90 tablet, Rfl: 1 .  montelukast (SINGULAIR) 10 MG tablet, TAKE 1 TABLET(10 MG) BY MOUTH AT BEDTIME, Disp: 90 tablet, Rfl: 1 .  QUEtiapine (SEROQUEL) 25 MG tablet, TAKE 1 TABLET(25 MG) BY MOUTH AT BEDTIME, Disp: 90 tablet, Rfl: 1  Allergies  Allergen Reactions  . Penicillins Anaphylaxis    Did it involve swelling of the face/tongue/throat, SOB, or low BP? Yes Did it involve sudden or  severe rash/hives, skin peeling, or any reaction on the inside of your mouth or nose? No Did you need to seek medical attention at a hospital or doctor's office? Yes When did it last happen?A long time ago If all above answers are "NO", may proceed with cephalosporin use.  Raelyn Ensign Venom     Bee    I personally reviewed active problem list, medication list, allergies, family history, social history, health maintenance with the patient/caregiver today.   ROS  Constitutional: Negative for fever or weight change.  Respiratory: Negative for cough and shortness of breath.   Cardiovascular: Negative for chest pain or palpitations.  Gastrointestinal: Negative for abdominal pain, no bowel changes.  Musculoskeletal: positive  for intermittent gait problem but no  joint swelling.  Skin: Negative for rash.   Neurological: Negative for dizziness or  headache.  No other specific complaints in a complete review of systems (except as listed in HPI above).  Objective  Vitals:   06/05/20 0916  BP: 130/80  Pulse: 90  Resp: 16  Temp: 97.7 F (36.5 C)  TempSrc: Oral  SpO2: 97%  Weight: 190 lb 3.2 oz (86.3 kg)  Height: 5\' 10"  (1.778 m)    Body mass index is 27.29 kg/m.  Physical Exam  Constitutional: Patient appears well-developed and well-nourished. Overweight  No distress.  HEENT: head atraumatic, normocephalic, pupils equal and reactive to light,  neck supple Cardiovascular: Normal rate, regular rhythm and normal heart sounds.  No murmur heard. No BLE edema. Pulmonary/Chest: Effort normal and breath sounds normal. No respiratory distress. Abdominal: Soft.  There is no tenderness. Psychiatric: Patient has a normal mood and affect. behavior is normal. Judgment and thought content normal.   PHQ2/9: Depression screen [facility] 2/9 06/05/2020 03/05/2020 11/26/2019 08/28/2019 05/01/2019  Decreased Interest 1 1 1 2  0  Down, Depressed, Hopeless 1 1 2 2  0  PHQ - 2 Score 2 2 3 4  0  Altered sleeping 2 0 3 1 1   Tired, decreased energy 3 2 3 2 1   Change in appetite 0 2 3 3 2   Feeling bad or failure about yourself  1 0 0 2 0  Trouble concentrating 1 1 2 2  0  Moving slowly or fidgety/restless 0 2 0 0 0  Suicidal thoughts 0 0 0 0 0  PHQ-9 Score 9 9 14 14 4   Difficult doing work/chores Somewhat difficult Somewhat difficult Very difficult Somewhat difficult Not difficult at all  Some recent data might be hidden    phq 9 is positive   Fall Risk: Fall Risk  06/05/2020 03/05/2020 11/26/2019 10/24/2019 09/12/2019  Falls in the past year? 0 0 0 0 0  Number falls in past yr: 0 0 0 0 0  Injury with Fall? 0 0 0 0 0  Comment - - - - -  Risk Factor Category  - - - - -  Comment - - - - -  Risk for fall due to : - - - - -  Follow up - - - - -     Functional Status Survey: Is the patient deaf or have difficulty hearing?: No Does the patient have  difficulty seeing, even when wearing glasses/contacts?: No Does the patient have difficulty concentrating, remembering, or making decisions?: Yes Does the patient have difficulty walking or climbing stairs?: Yes (Some days) Does the patient have difficulty dressing or bathing?: No Does the patient have difficulty doing errands alone such as visiting a doctor's office or shopping?: No  Assessment & Plan  1. Multiple sclerosis, relapsing-remitting (HCC)  - amphetamine-dextroamphetamine (ADDERALL XR) 20 MG 24 hr capsule; Take 1 capsule (20 mg total) by mouth See admin instructions.  Dispense: 90 capsule; Refill: 0  2. Need for immunization against influenza  - Flu Vaccine QUAD 36+ mos IM  3. Other insomnia  - QUEtiapine (SEROQUEL) 25 MG tablet; Take 1 tablet (25 mg total) by mouth at bedtime.  Dispense: 90 tablet; Refill: 1  4. Attention deficit hyperactivity disorder (ADHD), combined type  - amphetamine-dextroamphetamine (ADDERALL XR) 20 MG 24 hr capsule; Take 1 capsule (20 mg total) by mouth See admin instructions.  Dispense: 90 capsule; Refill: 0 - amphetamine-dextroamphetamine (ADDERALL) 10 MG tablet; Take 1 tablet (10 mg total) by mouth daily. On weekends  Dispense: 30 tablet; Refill: 0  5. Moderate major depression (HCC)  - amphetamine-dextroamphetamine (ADDERALL XR) 20 MG 24 hr capsule; Take 1 capsule (20 mg total) by mouth See admin instructions.  Dispense: 90 capsule; Refill: 0  6. Vitamin D deficiency   7. B12 deficiency   8. Pre-diabetes   9. Pituitary microadenoma ([place])   10. Mild intermittent asthma without complication   11. Thrombocytosis

## 2020-06-05 ENCOUNTER — Encounter: Payer: Self-pay | Admitting: Family Medicine

## 2020-06-05 ENCOUNTER — Ambulatory Visit: Payer: BC Managed Care – PPO | Admitting: Family Medicine

## 2020-06-05 ENCOUNTER — Other Ambulatory Visit: Payer: Self-pay

## 2020-06-05 VITALS — BP 130/80 | HR 90 | Temp 97.7°F | Resp 16 | Ht 70.0 in | Wt 190.2 lb

## 2020-06-05 DIAGNOSIS — Z23 Encounter for immunization: Secondary | ICD-10-CM | POA: Diagnosis not present

## 2020-06-05 DIAGNOSIS — J4520 Mild intermittent asthma, uncomplicated: Secondary | ICD-10-CM

## 2020-06-05 DIAGNOSIS — F321 Major depressive disorder, single episode, moderate: Secondary | ICD-10-CM

## 2020-06-05 DIAGNOSIS — G4709 Other insomnia: Secondary | ICD-10-CM

## 2020-06-05 DIAGNOSIS — R7303 Prediabetes: Secondary | ICD-10-CM

## 2020-06-05 DIAGNOSIS — G35 Multiple sclerosis: Secondary | ICD-10-CM | POA: Diagnosis not present

## 2020-06-05 DIAGNOSIS — F902 Attention-deficit hyperactivity disorder, combined type: Secondary | ICD-10-CM

## 2020-06-05 DIAGNOSIS — D352 Benign neoplasm of pituitary gland: Secondary | ICD-10-CM

## 2020-06-05 DIAGNOSIS — E559 Vitamin D deficiency, unspecified: Secondary | ICD-10-CM

## 2020-06-05 DIAGNOSIS — E538 Deficiency of other specified B group vitamins: Secondary | ICD-10-CM

## 2020-06-05 DIAGNOSIS — Z1159 Encounter for screening for other viral diseases: Secondary | ICD-10-CM

## 2020-06-05 DIAGNOSIS — D75839 Thrombocytosis, unspecified: Secondary | ICD-10-CM

## 2020-06-05 MED ORDER — QUETIAPINE FUMARATE 25 MG PO TABS: 25.0000 mg | ORAL_TABLET | Freq: Every day | ORAL | 1 refills | Status: DC

## 2020-06-05 MED ORDER — AMPHETAMINE-DEXTROAMPHET ER 20 MG PO CP24: 20.0000 mg | ORAL_CAPSULE | ORAL | 0 refills | Status: DC

## 2020-06-05 MED ORDER — AMPHETAMINE-DEXTROAMPHETAMINE 10 MG PO TABS: 10.0000 mg | ORAL_TABLET | Freq: Every day | ORAL | 0 refills | Status: DC

## 2020-06-25 ENCOUNTER — Other Ambulatory Visit: Payer: Self-pay | Admitting: Family Medicine

## 2020-06-25 DIAGNOSIS — Z1231 Encounter for screening mammogram for malignant neoplasm of breast: Secondary | ICD-10-CM

## 2020-07-25 IMAGING — CT CT CHEST W/O CM
2 of 4 series · 15 of 36 positions shown, 18 images · non-contrast
Comparison: Chest CT March 29, 2013

CLINICAL DATA: Previously noted pulmonary nodule

EXAM:
CT CHEST WITHOUT CONTRAST
TECHNIQUE: Multidetector CT imaging of the chest was performed following the
standard protocol without IV contrast.

[Series 2: chest 2.00 · axial · 0.70mm/px · z∈[-1230,-954]mm · 12 of 164 slices shown, 15 images]
[im 13/164  mediastinal]
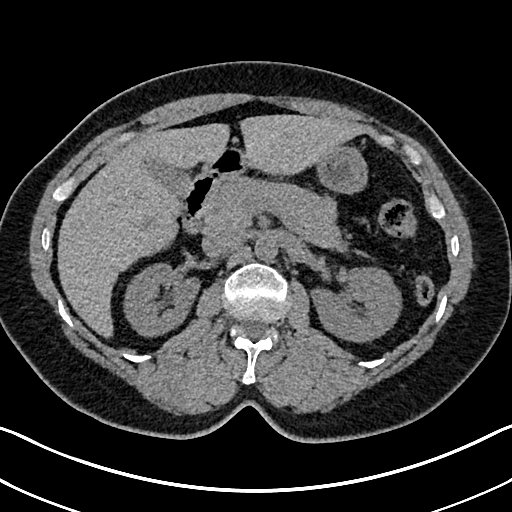
[im 13/164  lung]
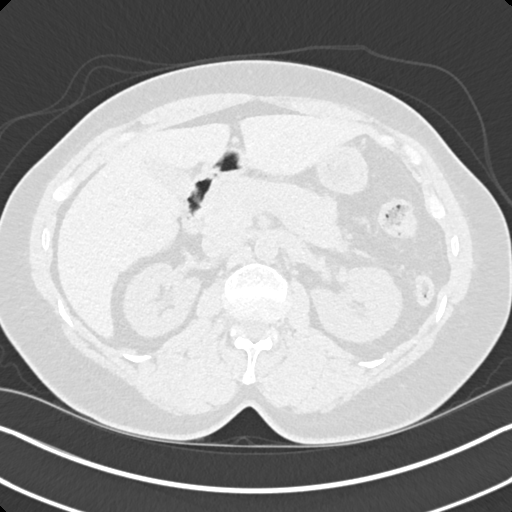
[im 26/164  lung]
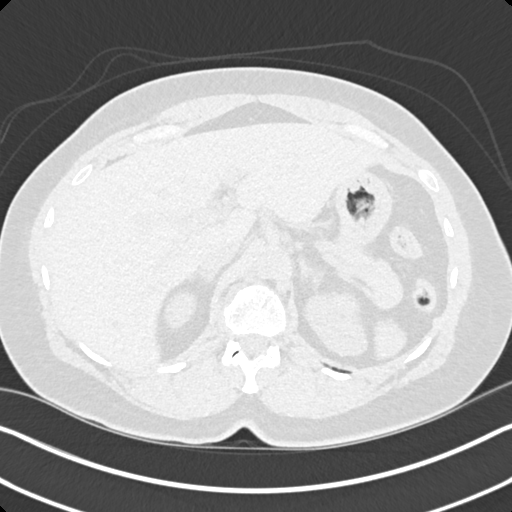
[im 38/164  lung]
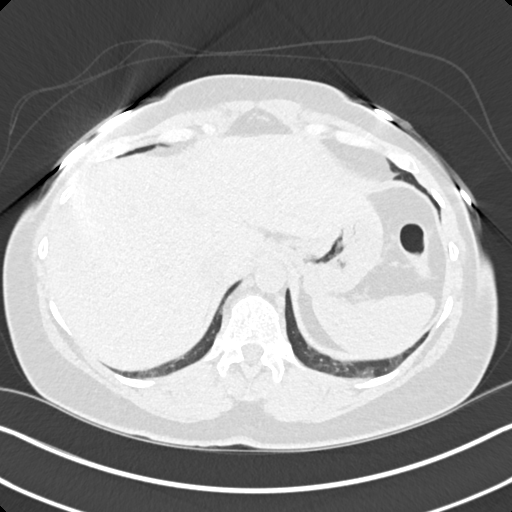
[im 51/164  lung]
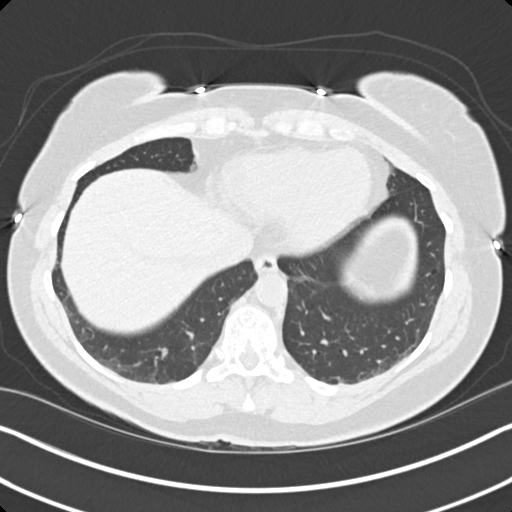
[im 63/164  mediastinal]
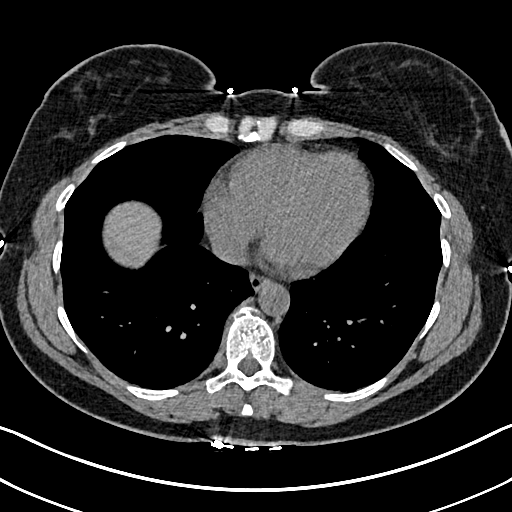
[im 63/164  lung]
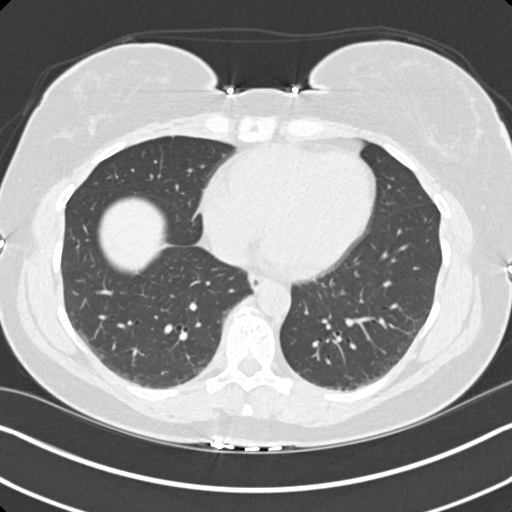
[im 76/164  lung]
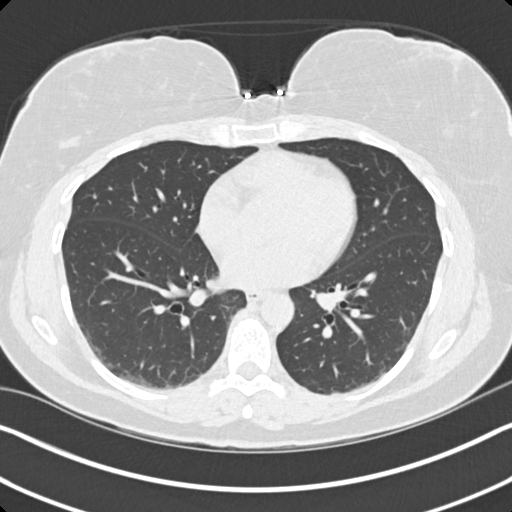
[im 88/164  lung]
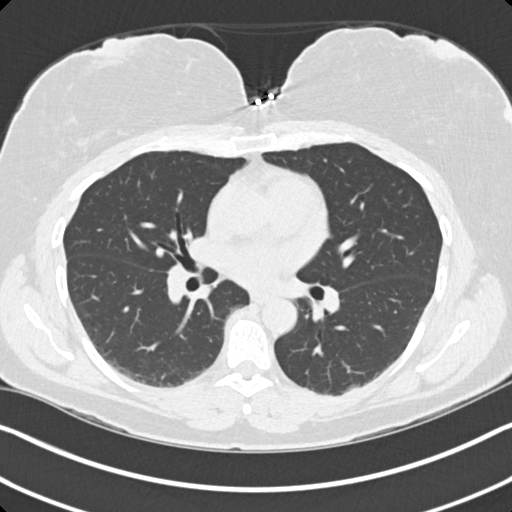
[im 101/164  lung]
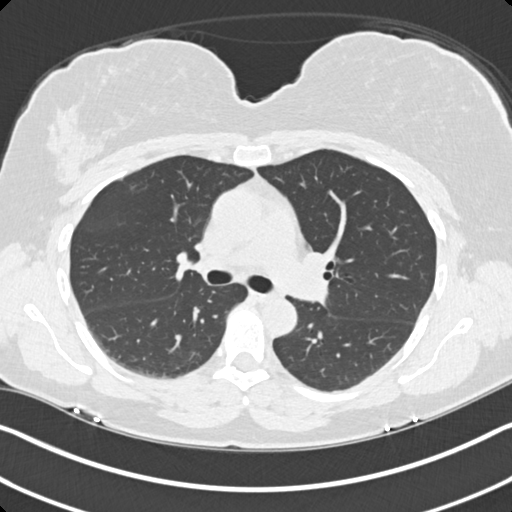
[im 113/164  mediastinal]
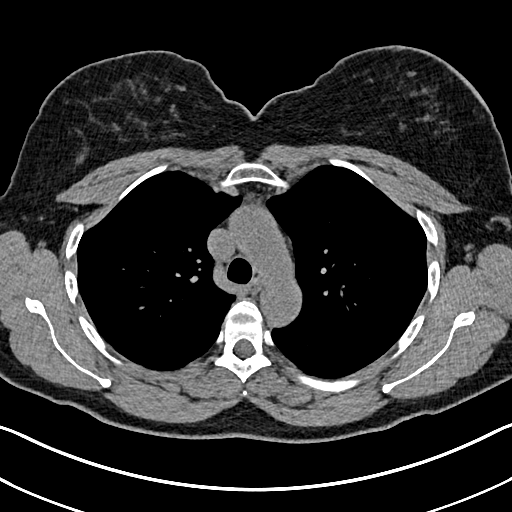
[im 113/164  lung]
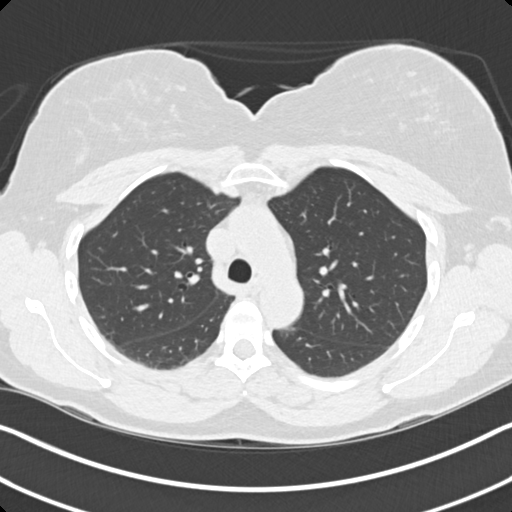
[im 126/164  lung]
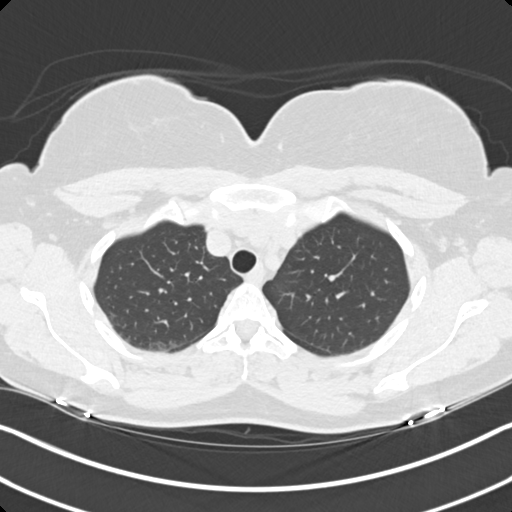
[im 138/164  lung]
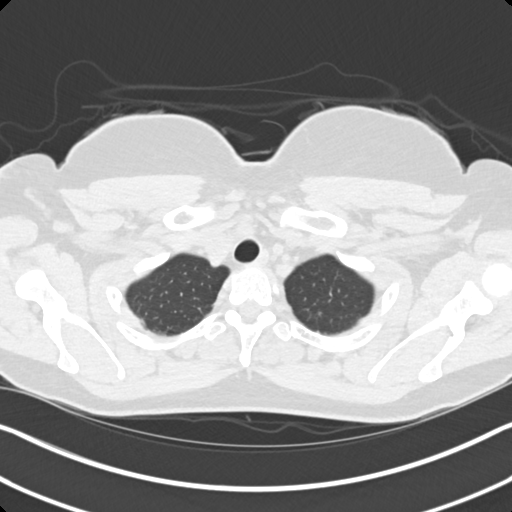
[im 151/164  lung]
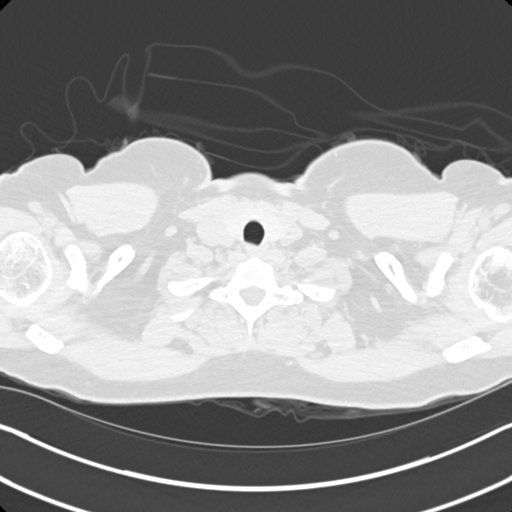

[Series 5: coronals chest 2.00 cor · coronal · 0.64mm/px · 3 of 160 slices shown]
[im 32/160  lung]
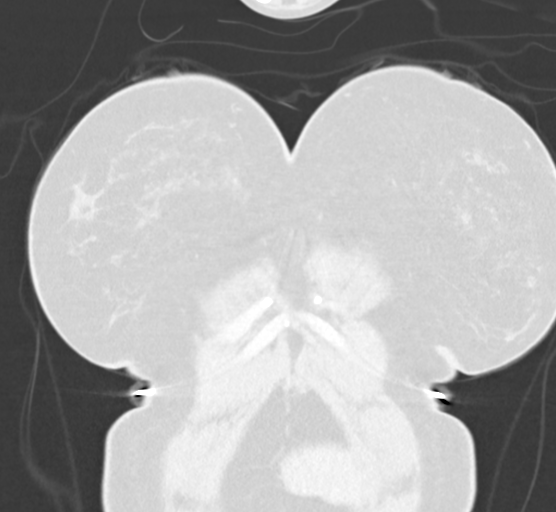
[im 64/160  lung]
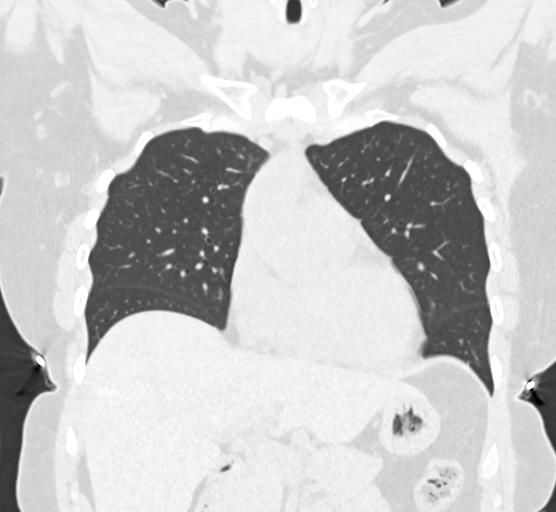
[im 96/160  lung]
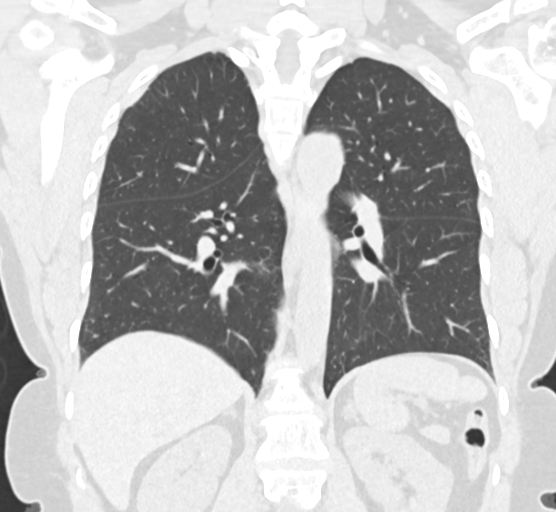

[15 of 36 positions shown; findings below may reference images not displayed]

FINDINGS: Cardiovascular: There is no thoracic aortic aneurysm. Visualized
great vessels appear unremarkable on this noncontrast enhanced
study. Note that the right innominate and left common carotid
arteries arise as a common trunk, an anatomic variant. There is no
pericardial effusion or pericardial thickening.

Mediastinum/Nodes: Visualized thyroid appears unremarkable. There
are scattered subcentimeter axillary and mediastinal lymph nodes
which do not meet size criteria for pathologic significance. No
adenopathy by size criteria evident in the thoracic region. No
esophageal lesions are evident.

Lungs/Pleura: There is a stable 4 mm nodular opacity arising from
the left major fissure, a likely small intra-fissural lymph node.
There is a nodular opacity in the posterior right base at the level
of the right hemidiaphragm measuring 5 mm which may be minimally
smaller than on the previous study. This nodular opacity is seen
currently on axial slice 122 series 3. No new parenchymal nodular
opacities are evident. There is mild bibasilar atelectatic change.
There is a degree of lower lobe bronchiectatic change bilaterally,
slightly more apparent than on prior study. There is no edema or
airspace opacity. No evident pleural effusions.

Upper Abdomen: There is incomplete visualization of a probable
subcentimeter cyst in the inferior posterior aspect of the liver.
There is a probable cyst in the right lobe of the liver midportion
measuring 7 mm. Visualized upper abdominal structures otherwise
appear unremarkable.

Musculoskeletal: There are foci of degenerative change in the
thoracic spine. No blastic or lytic bone lesions. No evident chest
wall lesions.
IMPRESSION: 1. Probable intramammary lymph node at the left major fissure
measuring 4 mm, stable. 5 mm nodular opacity posterior right base,
marginally smaller than on previous study. No new parenchymal lung
lesions.

2. Bibasilar atelectasis. No edema or airspace opacity. There is a
degree of lower lobe bronchiectatic change bilaterally.

3.  No evident adenopathy.

## 2020-08-26 ENCOUNTER — Other Ambulatory Visit: Payer: Self-pay | Admitting: Family Medicine

## 2020-08-26 DIAGNOSIS — G4709 Other insomnia: Secondary | ICD-10-CM

## 2020-08-26 NOTE — Telephone Encounter (Signed)
Non delegated refill request for Seroquel

## 2020-09-09 NOTE — Progress Notes (Unsigned)
Name: Robin Chan   MRN: [id number]    DOB: Sep 18, 1970   Date:09/10/2020       Progress Note  Subjective  Chief Complaint  Follow Up  HPI  ADD: taking medication daily as prescribed, on Adderal XR 20 mg now and she states she has been feeling well with medication , it helps her stay focused at work. She has only been taking Adderal immediate release on weekends when she sleeps in. She needs refills, out of medication for the past 6 days, advised to make appointment the week prior to running out of medication   Back pain: she states still has one ovary and on 01/13 she had severe back pain, like labor pain, no fever or chills, she has a history of endometriosis. She states it was cramping like and in waves, very intense and lasted 3 days. Not associated with dysuria or hematuria. She took Nsaid's without much help. Discussed checking pelvic US if it happens again. Explained it may have been an ovarian cyst . Consider pelvic US if it happens again   MS: diagnosed in 2000, but first symptoms in 1991 - she had numbness  on the right side and weakness. Currently off medication because of multiple side effects. Has intermittent weakness and has used a cane periodically, for left foot drop. Adderall helps with energy level. She still has left leg numbness intermittent - can last from hours to days. She is currently working full time [employer] . We repeated her MRI brain and plaques are stable. She sates having some tingling sensation on hands and arms with weather changes   Insomnia: she has been off Ambien, we stoppedit on theSpring 2018. She is taking Seroquel low dose qhs and helps her fall asleep ( takes about 10-20 minutes ), she is no longer having night terrors. She needs refill of medication   Major Depression: she was taking Lexapro for many years and depression was not controlled, we switched to Cymbalta in June 2017, She is separatedand divorcefinalized Fall 2020.Shestopped  seeing psychiatrist at [facility] She is taking medications. She states boyfriend is working again with his brother, but his court date for DUI is coming up and he will likely have to go to jail   Asthma: she is taking singulairdaily now , she denies wheezing, cough or SOB at this timeShe states as long as she takes allergy medications her symptoms are controlled. Unchanged    AR: she is using Flonase and is doing well, she had a reaction at work with moth balls but is doing well now . It is worse in the Spring   Broken tooth: seeing dentist, took Clindamycin and is going back for follow up  Pre-diabetes: denies polyphagia, polydipsia or polyuria, reviewed last A1C  Pituitary microadenoma: no nipple discharge or double vision, prolactin was normal on last labs    IMPRESSION: 1. Unchanged distribution of white matter lesions consistent with multiple sclerosis. No active demyelinating lesions. 2. 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  Patient Active Problem List   Diagnosis Date Noted  . Incisional hernia, without obstruction or gangrene   . Partial small bowel obstruction ([place]) 12/11/2018  . Pelvic pain 04/07/2017  . Medication management 04/07/2017  . Rectocele 07/08/2015  . Status post laparoscopic assisted vaginal hysterectomy ([facility]) 07/08/2015  . History of hysterectomy for benign disease 05/26/2015  . Left lumbar radiculitis 03/28/2015  . Breast cancer screening  02/05/2015  . Abnormal CAT scan 12/31/2014  . ADD (attention deficit disorder) 12/31/2014  . Allergic to bees 12/31/2014  . Back muscle spasm 12/31/2014  . Insomnia, persistent 12/31/2014  . Constipation 12/31/2014  . Depression, major, recurrent, mild ([place]) 12/31/2014  . Personal history of traumatic fracture 12/31/2014  . Asthma, mild intermittent 12/31/2014  . Migraine with aura and without status migrainosus, not  intractable 12/31/2014  . Multiple sclerosis, relapsing-remitting ([place]) 12/31/2014  . Endometriosis determined by laparoscopy 12/31/2014  . Perennial allergic rhinitis 12/31/2014  . Pituitary microadenoma ([place]) 12/31/2014  . Vitamin D deficiency 12/31/2014  . Acquired trigger finger 12/31/2014    Past Surgical History:  Procedure Laterality Date  . ABDOMINAL HYSTERECTOMY  05/26/2015  . BREAST BIOPSY Right 2005   benign  . INCISIONAL HERNIA REPAIR N/A 10/09/2019   Procedure: HERNIA REPAIR INCISIONAL;  Surgeon: Olean Ree, MD;  Location: [facility] ORS;  Service: General;  Laterality: N/A;  . INSERTION OF MESH N/A 10/09/2019   Procedure: INSERTION OF MESH;  Surgeon: Olean Ree, MD;  Location: [facility] ORS;  Service: General;  Laterality: N/A;  . LAPAROSCOPIC ASSISTED VAGINAL HYSTERECTOMY N/A 05/26/2015   Procedure: LAPAROSCOPIC ASSISTED VAGINAL HYSTERECTOMY, with right salpingectomy;  Surgeon: Brayton Mars, MD;  Location: [facility] ORS;  Service: Gynecology;  Laterality: N/A;  . LAPAROTOMY N/A 12/14/2018   Procedure: EXPLORATORY LAPAROTOMY;  Surgeon: Olean Ree, MD;  Location: [facility] ORS;  Service: General;  Laterality: N/A;  . OVARY SURGERY Left 09/30/2014  . RECTOCELE REPAIR N/A 05/26/2015   Procedure: POSTERIOR REPAIR (RECTOCELE);  Surgeon: Brayton Mars, MD;  Location: [facility] ORS;  Service: Gynecology;  Laterality: N/A;    Family History  Problem Relation Age of Onset  . Anemia Mother   . Osteoporosis Mother   . Mental illness Father        Bipolar  . Arthritis Brother   . Breast cancer Maternal Aunt     Social History   Tobacco Use  . Smoking status: Current Some Day Smoker    Packs/day: 0.25    Types: Cigarettes  . Smokeless tobacco: Never Used  Substance Use Topics  . Alcohol use: Yes    Alcohol/week: 14.0 standard drinks    Types: 14 Shots of liquor per week    Comment: occasionally, 1-2 shots every night     Current Outpatient Medications:  .  albuterol  (VENTOLIN HFA) 108 (90 Base) MCG/ACT inhaler, Inhale 1 puff into the lungs every 6 (six) hours as needed for wheezing or shortness of breath. Reported on 09/26/2015 (Patient taking differently: Inhale 1 puff into the lungs every 6 (six) hours as needed for wheezing or shortness of breath.), Disp: 24 g, Rfl: 0 .  amphetamine-dextroamphetamine (ADDERALL XR) 20 MG 24 hr capsule, Take 1 capsule (20 mg total) by mouth See admin instructions., Disp: 90 capsule, Rfl: 0 .  amphetamine-dextroamphetamine (ADDERALL) 10 MG tablet, Take 1 tablet (10 mg total) by mouth daily. On weekends, Disp: 30 tablet, Rfl: 0 .  cetirizine (ZYRTEC) 10 MG tablet, Take 10 mg by mouth at bedtime. , Disp: , Rfl:  .  Cholecalciferol (VITAMIN D3) 50 MCG (2000 UT) TABS, Take 2,000 Units by mouth daily., Disp: , Rfl:  .  clindamycin (CLEOCIN) 300 MG capsule, Take 300 mg by mouth 3 (three) times daily., Disp: , Rfl:  .  Cyanocobalamin (VITAMIN B-12 PO), Take 1 tablet by mouth daily., Disp: , Rfl:  .  DULoxetine (CYMBALTA) 30 MG capsule, TAKE 3 CAPSULES BY MOUTH AT BEDTIME,  Disp: 270 capsule, Rfl: 1 .  EPINEPHrine 0.3 mg/0.3 mL IJ SOAJ injection, Inject 0.3 mg into the muscle as needed for anaphylaxis. , Disp: , Rfl:  .  fluticasone (FLONASE) 50 MCG/ACT nasal spray, Place 2 sprays into both nostrils daily., Disp: 48 g, Rfl: 1 .  levocetirizine (XYZAL) 5 MG tablet, TAKE 1 TABLET(5 MG) BY MOUTH DAILY, Disp: 90 tablet, Rfl: 1 .  montelukast (SINGULAIR) 10 MG tablet, TAKE 1 TABLET(10 MG) BY MOUTH AT BEDTIME, Disp: 90 tablet, Rfl: 1 .  QUEtiapine (SEROQUEL) 25 MG tablet, TAKE 1 TABLET(25 MG) BY MOUTH AT BEDTIME, Disp: 90 tablet, Rfl: 0  Allergies  Allergen Reactions  . Penicillins Anaphylaxis    Did it involve swelling of the face/tongue/throat, SOB, or low BP? Yes Did it involve sudden or severe rash/hives, skin peeling, or any reaction on the inside of your mouth or nose? No Did you need to seek medical attention at a hospital or doctor's  office? Yes When did it last happen?A long time ago If all above answers are "NO", may proceed with cephalosporin use.  Raelyn Ensign Venom     Bee    I personally reviewed active problem list, medication list, allergies, family history, social history, health maintenance with the patient/caregiver today.   ROS  Constitutional: Negative for fever or weight change.  Respiratory: Negative for cough and shortness of breath.   Cardiovascular: Negative for chest pain or palpitations.  Gastrointestinal: Negative for abdominal pain, no bowel changes.  Musculoskeletal: Negative for gait problem or joint swelling.  Skin: Negative for rash.  Neurological: Negative for dizziness or headache.  No other specific complaints in a complete review of systems (except as listed in HPI above).  Objective  Vitals:   09/10/20 0853  BP: 120/68  Pulse: 81  Resp: 16  Temp: 98.1 F (36.7 C)  TempSrc: Oral  SpO2: 98%  Weight: 190 lb 3.2 oz (86.3 kg)  Height: 5\' 10"  (1.778 m)    Body mass index is 27.29 kg/m.  Physical Exam  Constitutional: Patient appears well-developed and well-nourished. Overweight  No distress.  HEENT: head atraumatic, normocephalic, pupils equal and reactive to light, neck supple Cardiovascular: Normal rate, regular rhythm and normal heart sounds.  No murmur heard. No BLE edema. Pulmonary/Chest: Effort normal and breath sounds normal. No respiratory distress. Abdominal: Soft.  There is no tenderness. Psychiatric: Patient has a normal mood and affect. behavior is normal. Judgment and thought content normal.  PHQ2/9: Depression screen [facility] 2/9 09/10/2020 06/05/2020 03/05/2020 11/26/2019 08/28/2019  Decreased Interest 1 1 1 1 2   Down, Depressed, Hopeless 0 1 1 2 2   PHQ - 2 Score 1 2 2 3 4   Altered sleeping 0 2 0 3 1  Tired, decreased energy 1 3 2 3 2   Change in appetite 0 0 2 3 3   Feeling bad or failure about yourself  0 1 0 0 2  Trouble concentrating 0 1 1 2 2   Moving slowly or  fidgety/restless 0 0 2 0 0  Suicidal thoughts 0 0 0 0 0  PHQ-9 Score 2 9 9 14 14   Difficult doing work/chores - Somewhat difficult Somewhat difficult Very difficult Somewhat difficult  Some recent data might be hidden    phq 9 is positive   Fall Risk: Fall Risk  09/10/2020 06/05/2020 03/05/2020 11/26/2019 10/24/2019  Falls in the past year? 0 0 0 0 0  Number falls in past yr: 0 0 0 0 0  Injury with Fall? 0  0 0 0 0  Comment - - - - -  Risk Factor Category  - - - - -  Comment - - - - -  Risk for fall due to : - - - - -  Follow up - - - - -     Functional Status Survey: Is the patient deaf or have difficulty hearing?: No Does the patient have difficulty seeing, even when wearing glasses/contacts?: Yes Does the patient have difficulty concentrating, remembering, or making decisions?: Yes Does the patient have difficulty walking or climbing stairs?: Yes Does the patient have difficulty dressing or bathing?: No Does the patient have difficulty doing errands alone such as visiting a doctor's office or shopping?: No    Assessment & Plan  1. Moderate major depression (HCC)  - amphetamine-dextroamphetamine (ADDERALL XR) 20 MG 24 hr capsule; Take 1 capsule (20 mg total) by mouth See admin instructions.  Dispense: 90 capsule; Refill: 0  2. Attention deficit hyperactivity disorder (ADHD), combined type  - amphetamine-dextroamphetamine (ADDERALL XR) 20 MG 24 hr capsule; Take 1 capsule (20 mg total) by mouth See admin instructions.  Dispense: 90 capsule; Refill: 0 - amphetamine-dextroamphetamine (ADDERALL) 10 MG tablet; Take 1 tablet (10 mg total) by mouth daily. On weekends  Dispense: 30 tablet; Refill: 0  3. Vitamin D deficiency   4. Pre-diabetes   5. B12 deficiency   6. Mild intermittent asthma without complication   7. Pituitary microadenoma (HCC)  Prolactin back to normal   8. Perennial allergic rhinitis with seasonal variation   9. Colon cancer screening  -  Ambulatory referral to Gastroenterology - she will call to re-schedule it   10. Dyslipidemia   11. Multiple sclerosis, relapsing-remitting (HCC)  - amphetamine-dextroamphetamine (ADDERALL XR) 20 MG 24 hr capsule; Take 1 capsule (20 mg total) by mouth See admin instructions.  Dispense: 90 capsule; Refill: 0  12. Other insomnia  - QUEtiapine (SEROQUEL) 25 MG tablet; Take 1 tablet (25 mg total) by mouth at bedtime.  Dispense: 90 tablet; Refill: 0  13. Thrombocytosis

## 2020-09-10 ENCOUNTER — Ambulatory Visit (INDEPENDENT_AMBULATORY_CARE_PROVIDER_SITE_OTHER): Payer: BC Managed Care – PPO | Admitting: Family Medicine

## 2020-09-10 ENCOUNTER — Other Ambulatory Visit: Payer: Self-pay

## 2020-09-10 ENCOUNTER — Encounter: Payer: Self-pay | Admitting: Family Medicine

## 2020-09-10 VITALS — BP 120/68 | HR 81 | Temp 98.1°F | Resp 16 | Ht 70.0 in | Wt 190.2 lb

## 2020-09-10 DIAGNOSIS — F902 Attention-deficit hyperactivity disorder, combined type: Secondary | ICD-10-CM

## 2020-09-10 DIAGNOSIS — E538 Deficiency of other specified B group vitamins: Secondary | ICD-10-CM

## 2020-09-10 DIAGNOSIS — E559 Vitamin D deficiency, unspecified: Secondary | ICD-10-CM

## 2020-09-10 DIAGNOSIS — Z1211 Encounter for screening for malignant neoplasm of colon: Secondary | ICD-10-CM

## 2020-09-10 DIAGNOSIS — R7303 Prediabetes: Secondary | ICD-10-CM

## 2020-09-10 DIAGNOSIS — J302 Other seasonal allergic rhinitis: Secondary | ICD-10-CM

## 2020-09-10 DIAGNOSIS — G4709 Other insomnia: Secondary | ICD-10-CM

## 2020-09-10 DIAGNOSIS — D75839 Thrombocytosis, unspecified: Secondary | ICD-10-CM

## 2020-09-10 DIAGNOSIS — J4520 Mild intermittent asthma, uncomplicated: Secondary | ICD-10-CM

## 2020-09-10 DIAGNOSIS — J3089 Other allergic rhinitis: Secondary | ICD-10-CM

## 2020-09-10 DIAGNOSIS — G35 Multiple sclerosis: Secondary | ICD-10-CM

## 2020-09-10 DIAGNOSIS — F321 Major depressive disorder, single episode, moderate: Secondary | ICD-10-CM | POA: Diagnosis not present

## 2020-09-10 DIAGNOSIS — E785 Hyperlipidemia, unspecified: Secondary | ICD-10-CM

## 2020-09-10 DIAGNOSIS — D352 Benign neoplasm of pituitary gland: Secondary | ICD-10-CM

## 2020-09-10 MED ORDER — AMPHETAMINE-DEXTROAMPHET ER 20 MG PO CP24: 20.0000 mg | ORAL_CAPSULE | ORAL | 0 refills | Status: DC

## 2020-09-10 MED ORDER — QUETIAPINE FUMARATE 25 MG PO TABS: 25.0000 mg | ORAL_TABLET | Freq: Every day | ORAL | 0 refills | Status: DC

## 2020-09-10 MED ORDER — AMPHETAMINE-DEXTROAMPHETAMINE 10 MG PO TABS: 10.0000 mg | ORAL_TABLET | Freq: Every day | ORAL | 0 refills | Status: DC

## 2020-09-19 ENCOUNTER — Encounter: Payer: Self-pay | Admitting: *Deleted

## 2020-11-11 DIAGNOSIS — D352 Benign neoplasm of pituitary gland: Secondary | ICD-10-CM | POA: Diagnosis not present

## 2020-11-20 ENCOUNTER — Other Ambulatory Visit: Payer: Self-pay | Admitting: Family Medicine

## 2020-11-20 DIAGNOSIS — F321 Major depressive disorder, single episode, moderate: Secondary | ICD-10-CM

## 2020-11-20 NOTE — Telephone Encounter (Signed)
Requested Prescriptions  Pending Prescriptions Disp Refills  . DULoxetine (CYMBALTA) 30 MG capsule [Pharmacy Med Name: DULOXETINE DR 30MG  CAPSULES] 270 capsule 1    Sig: TAKE 3 CAPSULES BY MOUTH AT BEDTIME     Psychiatry: Antidepressants - SNRI Passed - 11/20/2020  3:27 AM      Passed - Completed PHQ-2 or PHQ-9 in the last 360 days      Passed - Last BP in normal range    BP Readings from Last 1 Encounters:  09/10/20 120/68         Passed - Valid encounter within last 6 months    Recent Outpatient Visits          2 months ago Moderate major depression [facility])   [facility] Steele Sizer, MD   5 months ago Multiple sclerosis, relapsing-remitting [facility])   [facility] Steele Sizer, MD   8 months ago Thrombocytosis [facility])   [facility] Steele Sizer, MD   12 months ago Multiple sclerosis, relapsing-remitting [facility])   [facility] Steele Sizer, MD   1 year ago Multiple sclerosis, relapsing-remitting [facility])   [facility] Steele Sizer, MD      Future Appointments            In 6 days Steele Sizer, MD [facility]

## 2020-11-24 NOTE — Progress Notes (Signed)
Name: Robin Chan   MRN: [id number]    DOB: Dec 10, 1970   Date:11/26/2020       Progress Note  Subjective  Chief Complaint  Follow Up  HPI  ADD: taking medication daily as prescribed, on Adderal XR 20 mg now and she states she has been feeling well with medication , it helps her stay focused at work. She has only been taking Adderal immediate release on weekends when she sleeps in. She states sometimes XR does not last all day and she crashes in the evenings she did well on Vyvanse but not sure if current insurance will cover it -she will ask pharmacist and let me know if she is ready to change back to Vyvanse  MS: diagnosed in 2000, but first symptoms in 1991 - she had numbness  on the right side and weakness. Currently off medication because of multiple side effects. Has intermittent weakness and has used a cane periodically, for left foot drop. Adderall helps with energy level. She still has left leg numbness intermittent - can last from hours to days. She is currently working full time [employer] . We repeated her MRI brain and plaques are stable. She sates having some tingling sensation on hands also noticing some recurrence of left foot drop   Insomnia: she has been off Ambien, we stoppedit on theSpring 2018. She is taking Seroquel low dose qhs and helps her fall asleep ( takes about 10-20 minutes ), she is no longer having night terrors. She does not sleep well when her boyfriend is not home on weekends. Sleeping fine during the week days  Major Depression: she was taking Lexapro for many years and depression was not controlled, we switched to Cymbalta in June 2017, She is separatedand divorcefinalized Fall 2020.[facility]sto[facility] She is taking medications. She states boyfriend is not working again, he is going to jail on weekends for his DUI. She is also stressed at work , has to meet quotas and had a crying spell at work yesterday . She  is also having MS symptoms again   Asthma: she is taking singulairdaily now , she denies wheezing, cough or SOB at this time, even though it is when she gets a flare  AR: she is using Flonase, singulair, zyrtec and xyzal daily  and is doing well, only has an occasional sneezing   Pre-diabetes: denies polyphagia, polydipsia or polyuria, Last A1C was 5.6%   Pituitary microadenoma: no nipple discharge or double vision, prolactin was normal on last labs  Unchanged   IMPRESSION: 1. Unchanged distribution of white matter lesions consistent with multiple sclerosis. No active demyelinating lesions. 2. 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  Callus on both feet: going on for months, tried topical medication and is doing better but it is tender when walking , we will refer her to podiatrist   Patient Active Problem List   Diagnosis Date Noted  . Incisional hernia, without obstruction or gangrene   . Partial small bowel obstruction ([place]) 12/11/2018  . Pelvic pain 04/07/2017  . Medication management 04/07/2017  . Rectocele 07/08/2015  . Status post laparoscopic assisted vaginal hysterectomy (LAV[facility]) 07/08/2015  . History of hysterectomy for benign disease 05/26/2015  . Left lumbar radiculitis 03/28/2015  . Breast cancer screening 02/05/2015  . Abnormal CAT scan 12/31/2014  . ADD (attention deficit disorder) 12/31/2014  . Allergic to bees 12/31/2014  . Back muscle  spasm 12/31/2014  . Insomnia, persistent 12/31/2014  . Constipation 12/31/2014  . Depression, major, recurrent, mild ([place]) 12/31/2014  . Personal history of traumatic fracture 12/31/2014  . Asthma, mild intermittent 12/31/2014  . Migraine with aura and without status migrainosus, not intractable 12/31/2014  . Multiple sclerosis, relapsing-remitting ([place]) 12/31/2014  . Endometriosis determined by laparoscopy 12/31/2014  . Perennial allergic rhinitis  12/31/2014  . Pituitary microadenoma ([place]) 12/31/2014  . Vitamin D deficiency 12/31/2014  . Acquired trigger finger 12/31/2014    Past Surgical History:  Procedure Laterality Date  . ABDOMINAL HYSTERECTOMY  05/26/2015  . BREAST BIOPSY Right 2005   benign  . INCISIONAL HERNIA REPAIR N/A 10/09/2019   Procedure: HERNIA REPAIR INCISIONAL;  Surgeon: Olean Ree, MD;  Location: [facility] ORS;  Service: General;  Laterality: N/A;  . INSERTION OF MESH N/A 10/09/2019   Procedure: INSERTION OF MESH;  Surgeon: Olean Ree, MD;  Location: [facility] ORS;  Service: General;  Laterality: N/A;  . LAPAROSCOPIC ASSISTED VAGINAL HYSTERECTOMY N/A 05/26/2015   Procedure: LAPAROSCOPIC ASSISTED VAGINAL HYSTERECTOMY, with right salpingectomy;  Surgeon: Brayton Mars, MD;  Location: [facility] ORS;  Service: Gynecology;  Laterality: N/A;  . LAPAROTOMY N/A 12/14/2018   Procedure: EXPLORATORY LAPAROTOMY;  Surgeon: Olean Ree, MD;  Location: [facility] OR[facility];  Service: General;  Laterality: N/A;  . OVARY SURGERY Left 09/30/2014  . RECTOCELE REPAIR N/A 05/26/2015   Procedure: POSTERIOR REPAIR (RECTOCELE);  Surgeon: Brayton Mars, MD;  Location: [facility];  Service: Gynecology;  Laterality: N/A;    Family History  Problem Relation Age of Onset  . Anemia Mother   . Osteoporosis Mother   . Mental illness Father        Bipolar  . Arthritis Brother   . Breast cancer Maternal Aunt     Social History   Tobacco Use  . Smoking status: Current Some Day Smoker    Packs/day: 0.25    Types: Cigarettes  . Smokeless tobacco: Never Used  Substance Use Topics  . Alcohol use: Yes    Alcohol/week: 14.0 standard drinks    Types: 14 Shots of liquor per week    Comment: occasionally, 1-2 shots every night     Current Outpatient Medications:  .  albuterol (VENTOLIN HFA) 108 (90 Base) MCG/ACT inhaler, Inhale 1 puff into the lungs every 6 (six) hours as needed for wheezing or shortness of breath. Reported on 09/26/2015 (Patient  taking differently: Inhale 1 puff into the lungs every 6 (six) hours as needed for wheezing or shortness of breath.), Disp: 24 g, Rfl: 0 .  amphetamine-dextroamphetamine (ADDERALL XR) 20 MG 24 hr capsule, Take 1 capsule (20 mg total) by mouth See admin instructions., Disp: 90 capsule, Rfl: 0 .  amphetamine-dextroamphetamine (ADDERALL) 10 MG tablet, Take 1 tablet (10 mg total) by mouth daily. On weekends, Disp: 30 tablet, Rfl: 0 .  cetirizine (ZYRTEC) 10 MG tablet, Take 10 mg by mouth at bedtime. , Disp: , Rfl:  .  Cholecalciferol (VITAMIN D3) 50 MCG (2000 UT) TABS, Take 2,000 Units by mouth daily., Disp: , Rfl:  .  Cyanocobalamin (VITAMIN B-12 PO), Take 1 tablet by mouth daily., Disp: , Rfl:  .  DULoxetine (CYMBALTA) 30 MG capsule, TAKE 3 CAPSULES BY MOUTH AT BEDTIME, Disp: 270 capsule, Rfl: 1 .  EPINEPHrine 0.3 mg/0.3 mL IJ SOAJ injection, Inject 0.3 mg into the muscle as needed for anaphylaxis. , Disp: , Rfl:  .  fluticasone (FLONASE) 50 MCG/ACT nasal spray, Place 2 sprays into both nostrils daily.,  Disp: 48 g, Rfl: 1 .  levocetirizine (XYZAL) 5 MG tablet, TAKE 1 TABLET(5 MG) BY MOUTH DAILY, Disp: 90 tablet, Rfl: 1 .  montelukast (SINGULAIR) 10 MG tablet, TAKE 1 TABLET(10 MG) BY MOUTH AT BEDTIME, Disp: 90 tablet, Rfl: 1 .  QUEtiapine (SEROQUEL) 25 MG tablet, Take 1 tablet (25 mg total) by mouth at bedtime., Disp: 90 tablet, Rfl: 0  Allergies  Allergen Reactions  . Penicillins Anaphylaxis    Did it involve swelling of the face/tongue/throat, SOB, or low BP? Yes Did it involve sudden or severe rash/hives, skin peeling, or any reaction on the inside of your mouth or nose? No Did you need to seek medical attention at a hospital or doctor's office? Yes When did it last happen?A long time ago If all above answers are "NO", may proceed with cephalosporin use.  Raelyn Ensign Venom     Bee    I personally reviewed active problem list, medication list, allergies, family history, social history, health  maintenance, notes from last encounter with the patient/caregiver today.   ROS  Constitutional: Negative for fever or weight change.  Respiratory: Negative for cough and shortness of breath.   Cardiovascular: Negative for chest pain or palpitations.  Gastrointestinal: Negative for abdominal pain, no bowel changes.  Musculoskeletal:positive  for gait problem secondary to MS and left foot drop and callus that is causing pain  Skin: Negative for rash.  Neurological: Negative for dizziness or headache.  No other specific complaints in a complete review of systems (except as listed in HPI above).  Objective  Vitals:   11/26/20 0941  BP: 134/82  Pulse: 92  Resp: 16  Temp: 98.1 F (36.7 C)  TempSrc: Oral  SpO2: 97%  Weight: 193 lb (87.5 kg)  Height: 5\' 10"  (1.778 m)    Body mass index is 27.69 kg/m.  Physical Exam  Constitutional: Patient appears well-developed and well-nourished. Overweight.  No distress.  HEENT: head atraumatic, normocephalic, pupils equal and reactive to light, neck supple, Cardiovascular: Normal rate, regular rhythm and normal heart sounds.  No murmur heard. No BLE edema. Pulmonary/Chest: Effort normal and breath sounds normal. No respiratory distress. Abdominal: Soft.  There is no tenderness. Neurological: normal grip, mild left foot drop , some paresthesias of both hands  Foot: round area , about quarter in size, tender to touch, no oozing, possible plantar wart versus callus  Psychiatric: Patient has a normal mood and affect. behavior is normal. Judgment and thought content normal.  PHQ2/9: Depression screen [facility] 2/9 11/26/2020 09/10/2020 06/05/2020 03/05/2020 11/26/2019  Decreased Interest 0 1 1 1 1   Down, Depressed, Hopeless 0 0 1 1 2   PHQ - 2 Score 0 1 2 2 3   Altered sleeping 2 0 2 0 3  Tired, decreased energy - 1 3 2 3   Change in appetite 0 0 0 2 3  Feeling bad or failure about yourself  0 0 1 0 0  Trouble concentrating 0 0 1 1 2   Moving slowly or  fidgety/restless 0 0 0 2 0  Suicidal thoughts 0 0 0 0 0  PHQ-9 Score 2 2 9 9 14   Difficult doing work/chores - - Somewhat difficult Somewhat difficult Very difficult  Some recent data might be hidden    phq 9 is negative   Fall Risk: Fall Risk  11/26/2020 09/10/2020 06/05/2020 03/05/2020 11/26/2019  Falls in the past year? 0 0 0 0 0  Number falls in past yr: 0 0 0 0 0  Injury with  Fall? 0 0 0 0 0  Comment - - - - -  Risk Factor Category  - - - - -  Comment - - - - -  Risk for fall due to : - - - - -  Follow up - - - - -     Functional Status Survey: Is the patient deaf or have difficulty hearing?: No Does the patient have difficulty seeing, even when wearing glasses/contacts?: No Does the patient have difficulty concentrating, remembering, or making decisions?: No Does the patient have difficulty walking or climbing stairs?: No Does the patient have difficulty dressing or bathing?: No Does the patient have difficulty doing errands alone such as visiting a doctor's office or shopping?: No    Assessment & Plan  1. Moderate major depression (HCC)  - amphetamine-dextroamphetamine (ADDERALL XR) 20 MG 24 hr capsule; Take 1 capsule (20 mg total) by mouth See admin instructions. Fill May 14 th 2022  Dispense: 90 capsule; Refill: 0  2. Multiple sclerosis, relapsing-remitting (HCC)  - amphetamine-dextroamphetamine (ADDERALL XR) 20 MG 24 hr capsule; Take 1 capsule (20 mg total) by mouth See admin instructions. Fill May 14 th 2022  Dispense: 90 capsule; Refill: 0  3. Callus of foot  - Ambulatory referral to Podiatry  4. Pre-diabetes   5. Vitamin D deficiency   6. B12 deficiency   7. Pituitary microadenoma (HCC)  Stable   8. Attention deficit hyperactivity disorder (ADHD), combined type  - amphetamine-dextroamphetamine (ADDERALL XR) 20 MG 24 hr capsule; Take 1 capsule (20 mg total) by mouth See admin instructions. Fill May 14 th 2022  Dispense: 90 capsule; Refill: 0 -  amphetamine-dextroamphetamine (ADDERALL) 10 MG tablet; Take 1 tablet (10 mg total) by mouth daily. On weekends  Dispense: 30 tablet; Refill: 0  9. Perennial allergic rhinitis with seasonal variation   10. Mild intermittent asthma without complication   11. Dyslipidemia   12. Other insomnia  - QUEtiapine (SEROQUEL) 25 MG tablet; Take 1 tablet (25 mg total) by mouth at bedtime.  Dispense: 90 tablet; Refill: 0

## 2020-11-26 ENCOUNTER — Ambulatory Visit: Payer: BC Managed Care – PPO | Admitting: Family Medicine

## 2020-11-26 ENCOUNTER — Other Ambulatory Visit: Payer: Self-pay

## 2020-11-26 ENCOUNTER — Encounter: Payer: Self-pay | Admitting: Family Medicine

## 2020-11-26 VITALS — BP 134/82 | HR 92 | Temp 98.1°F | Resp 16 | Ht 70.0 in | Wt 193.0 lb

## 2020-11-26 DIAGNOSIS — G35 Multiple sclerosis: Secondary | ICD-10-CM

## 2020-11-26 DIAGNOSIS — J3089 Other allergic rhinitis: Secondary | ICD-10-CM

## 2020-11-26 DIAGNOSIS — E785 Hyperlipidemia, unspecified: Secondary | ICD-10-CM

## 2020-11-26 DIAGNOSIS — R7303 Prediabetes: Secondary | ICD-10-CM | POA: Diagnosis not present

## 2020-11-26 DIAGNOSIS — G4709 Other insomnia: Secondary | ICD-10-CM

## 2020-11-26 DIAGNOSIS — E538 Deficiency of other specified B group vitamins: Secondary | ICD-10-CM

## 2020-11-26 DIAGNOSIS — J302 Other seasonal allergic rhinitis: Secondary | ICD-10-CM

## 2020-11-26 DIAGNOSIS — J4520 Mild intermittent asthma, uncomplicated: Secondary | ICD-10-CM

## 2020-11-26 DIAGNOSIS — L84 Corns and callosities: Secondary | ICD-10-CM | POA: Diagnosis not present

## 2020-11-26 DIAGNOSIS — Z1211 Encounter for screening for malignant neoplasm of colon: Secondary | ICD-10-CM

## 2020-11-26 DIAGNOSIS — F321 Major depressive disorder, single episode, moderate: Secondary | ICD-10-CM | POA: Diagnosis not present

## 2020-11-26 DIAGNOSIS — D352 Benign neoplasm of pituitary gland: Secondary | ICD-10-CM

## 2020-11-26 DIAGNOSIS — F902 Attention-deficit hyperactivity disorder, combined type: Secondary | ICD-10-CM

## 2020-11-26 DIAGNOSIS — E559 Vitamin D deficiency, unspecified: Secondary | ICD-10-CM

## 2020-11-26 MED ORDER — AMPHETAMINE-DEXTROAMPHET ER 20 MG PO CP24: 20.0000 mg | ORAL_CAPSULE | ORAL | 0 refills | Status: DC

## 2020-11-26 MED ORDER — AMPHETAMINE-DEXTROAMPHETAMINE 10 MG PO TABS: 10.0000 mg | ORAL_TABLET | Freq: Every day | ORAL | 0 refills | Status: DC

## 2020-11-26 MED ORDER — QUETIAPINE FUMARATE 25 MG PO TABS: 25.0000 mg | ORAL_TABLET | Freq: Every day | ORAL | 0 refills | Status: DC

## 2020-12-05 ENCOUNTER — Encounter: Payer: Self-pay | Admitting: Family Medicine

## 2020-12-05 DIAGNOSIS — L851 Acquired keratosis [keratoderma] palmaris et plantaris: Secondary | ICD-10-CM | POA: Diagnosis not present

## 2020-12-05 DIAGNOSIS — M778 Other enthesopathies, not elsewhere classified: Secondary | ICD-10-CM | POA: Diagnosis not present

## 2020-12-19 ENCOUNTER — Encounter: Payer: Self-pay | Admitting: Family Medicine

## 2020-12-23 ENCOUNTER — Other Ambulatory Visit: Payer: Self-pay | Admitting: Family Medicine

## 2020-12-23 DIAGNOSIS — F321 Major depressive disorder, single episode, moderate: Secondary | ICD-10-CM

## 2020-12-23 MED ORDER — DULOXETINE HCL 30 MG PO CPEP: 30.0000 mg | ORAL_CAPSULE | Freq: Every day | ORAL | 1 refills | Status: DC

## 2020-12-23 MED ORDER — DULOXETINE HCL 60 MG PO CPEP: 60.0000 mg | ORAL_CAPSULE | Freq: Every day | ORAL | 1 refills | Status: DC

## 2021-02-04 ENCOUNTER — Encounter: Payer: Self-pay | Admitting: Family Medicine

## 2021-02-04 ENCOUNTER — Telehealth (INDEPENDENT_AMBULATORY_CARE_PROVIDER_SITE_OTHER): Payer: BC Managed Care – PPO | Admitting: Family Medicine

## 2021-02-04 VITALS — Ht 69.0 in | Wt 195.0 lb

## 2021-02-04 DIAGNOSIS — J069 Acute upper respiratory infection, unspecified: Secondary | ICD-10-CM | POA: Diagnosis not present

## 2021-02-04 NOTE — Progress Notes (Signed)
Virtual Visit via Phone Note  I connected with MYKELL GENAO on 02/04/21 at  3:40 PM EDT by phone and verified that I am speaking with the correct person using two identifiers.  Location: Patient: work Provider: [facility]   I discussed the limitations of evaluation and management by telemedicine and the availability of in person appointments. The patient expressed understanding and agreed to proceed.  History of Present Illness:  UPPER RESPIRATORY TRACT INFECTION - symptom onset yesterday - has not been tested for COVID - has received 2 doses of mRNA vaccine  Fever: no Cough: no Shortness of breath: no Wheezing: no Chest pain: no Chest tightness: no Chest congestion: no Nasal congestion: yes Runny nose: no Sneezing: no Sore throat: no Sinus pressure: yes Headache: yes Facial pain: yes Ear pain: yes bilateral Ear pressure: yes bilateral Eyes red/itching:no Eye drainage/crusting: no  Vomiting: no Sick contacts: no Relief with OTC cold/cough medications: yes  Treatments attempted: sudafed, ibuprofen     Observations/Objective:  Patient had trouble connecting to video visit, entirety of visit conducted over the phone.  Speaks in full sentences, no respiratory distress.   Assessment and Plan:  URI Doing well with mild sx. Will test for COVID, if positive would be a candidate for COVID treatment. Reviewed OTC symptom relief, self-quarantine guidelines, and emergency precautions.    I discussed the assessment and treatment plan with the patient. The patient was provided an opportunity to ask questions and all were answered. The patient agreed with the plan and demonstrated an understanding of the instructions.   The patient was advised to call back or seek an in-person evaluation if the symptoms worsen or if the condition fails to improve as anticipated.  I provided 7 minutes of non-face-to-face time during this encounter.   Myles Gip, DO

## 2021-02-04 NOTE — Patient Instructions (Signed)
It was great to see you!  Our plans for today:  - We are testing you for COVID. Go to [email or web address] to schedule.  - See below for self-isolation guidelines. You may end your quarantine once your test returns negative or, if positive, are 5 days from symptom onset and fever free for 24 hours without use of tylenol or ibuprofen. Wear a well-fitting N95 mask for an additional 5 days. - Let us know if your test is positive so we can order the antiviral for you. It is important to start taking this within 5 days of symptom onset.  - I recommend getting your COVID booster once you are healed from your current infection. - Certainly, if you are having difficulties breathing or unable to keep down fluids, go to the Emergency Department.   Take care and seek immediate care sooner if you develop any concerns.   Dr. Ky Barban     Person Under Monitoring Name: Robin Chan  Location: [address] [place] [address]   Infection Prevention Recommendations for Individuals Confirmed to have, or Being Evaluated for, 2019 Novel Coronavirus (COVID-19) Infection Who Receive Care at Home  Individuals who are confirmed to have, or are being evaluated for, COVID-19 should follow the prevention steps below until a healthcare provider or local or state health department says they can return to normal activities.  Stay home except to get medical care You should restrict activities outside your home, except for getting medical care. Do not go to work, school, or public areas, and do not use public transportation or taxis.  Call ahead before visiting your doctor Before your medical appointment, call the healthcare provider and tell them that you have, or are being evaluated for, COVID-19 infection. This will help the healthcare provider's office take steps to keep other people from getting infected. Ask your healthcare provider to call the local or state health department.  Monitor your  symptoms Seek prompt medical attention if your illness is worsening (e.g., difficulty breathing). Before going to your medical appointment, call the healthcare provider and tell them that you have, or are being evaluated for, COVID-19 infection. Ask your healthcare provider to call the local or state health department.  Wear a facemask You should wear a facemask that covers your nose and mouth when you are in the same room with other people and when you visit a healthcare provider. People who live with or visit you should also wear a facemask while they are in the same room with you.  Separate yourself from other people in your home As much as possible, you should stay in a different room from other people in your home. Also, you should use a separate bathroom, if available.  Avoid sharing household items You should not share dishes, drinking glasses, cups, eating utensils, towels, bedding, or other items with other people in your home. After using these items, you should wash them thoroughly with soap and water.  Cover your coughs and sneezes Cover your mouth and nose with a tissue when you cough or sneeze, or you can cough or sneeze into your sleeve. Throw used tissues in a lined trash can, and immediately wash your hands with soap and water for at least 20 seconds or use an alcohol-based hand rub.  Wash your [facility] your hands often and thoroughly with soap and water for at least 20 seconds. You can use an alcohol-based hand sanitizer if soap and water are not available and if your hands  are not visibly dirty. Avoid touching your eyes, nose, and mouth with unwashed hands.   Prevention Steps for Caregivers and Household Members of Individuals Confirmed to have, or Being Evaluated for, COVID-19 Infection Being Cared for in the Home  If you live with, or provide care at home for, a person confirmed to have, or being evaluated for, COVID-19 infection please follow these guidelines  to prevent infection:  Follow healthcare provider's instructions Make sure that you understand and can help the patient follow any healthcare provider instructions for all care.  Provide for the patient's basic needs You should help the patient with basic needs in the home and provide support for getting groceries, prescriptions, and other personal needs.  Monitor the patient's symptoms If they are getting sicker, call his or her medical provider and tell them that the patient has, or is being evaluated for, COVID-19 infection. This will help the healthcare provider's office take steps to keep other people from getting infected. Ask the healthcare provider to call the local or state health department.  Limit the number of people who have contact with the patient If possible, have only one caregiver for the patient. Other household members should stay in another home or place of residence. If this is not possible, they should stay in another room, or be separated from the patient as much as possible. Use a separate bathroom, if available. Restrict visitors who do not have an essential need to be in the home.  Keep older adults, very young children, and other sick people away from the patient Keep older adults, very young children, and those who have compromised immune systems or chronic health conditions away from the patient. This includes people with chronic heart, lung, or kidney conditions, diabetes, and cancer.  Ensure good ventilation Make sure that shared spaces in the home have good air flow, such as from an air conditioner or an opened window, weather permitting.  Wash your hands often Wash your hands often and thoroughly with soap and water for at least 20 seconds. You can use an alcohol based hand sanitizer if soap and water are not available and if your hands are not visibly dirty. Avoid touching your eyes, nose, and mouth with unwashed hands. Use disposable paper towels to  dry your hands. If not available, use dedicated cloth towels and replace them when they become wet.  Wear a facemask and gloves Wear a disposable facemask at all times in the room and gloves when you touch or have contact with the patient's blood, body fluids, and/or secretions or excretions, such as sweat, saliva, sputum, nasal mucus, vomit, urine, or feces.  Ensure the mask fits over your nose and mouth tightly, and do not touch it during use. Throw out disposable facemasks and gloves after using them. Do not reuse. Wash your hands immediately after removing your facemask and gloves. If your personal clothing becomes contaminated, carefully remove clothing and launder. Wash your hands after handling contaminated clothing. Place all used disposable facemasks, gloves, and other waste in a lined container before disposing them with other household waste. Remove gloves and wash your hands immediately after handling these items.  Do not share dishes, glasses, or other household items with the patient Avoid sharing household items. You should not share dishes, drinking glasses, cups, eating utensils, towels, bedding, or other items with a patient who is confirmed to have, or being evaluated for, COVID-19 infection. After the person uses these items, you should wash them thoroughly with soap  and water.  Wash laundry thoroughly Immediately remove and wash clothes or bedding that have blood, body fluids, and/or secretions or excretions, such as sweat, saliva, sputum, nasal mucus, vomit, urine, or feces, on them. Wear gloves when handling laundry from the patient. Read and follow directions on labels of laundry or clothing items and detergent. In general, wash and dry with the warmest temperatures recommended on the label.  Clean all areas the individual has used often Clean all touchable surfaces, such as counters, tabletops, doorknobs, bathroom fixtures, toilets, phones, keyboards, tablets, and bedside  tables, every day. Also, clean any surfaces that may have blood, body fluids, and/or secretions or excretions on them. Wear gloves when cleaning surfaces the patient has come in contact with. Use a diluted bleach solution (e.g., dilute bleach with 1 part bleach and 10 parts water) or a household disinfectant with a label that says EPA-registered for coronaviruses. To make a bleach solution at home, add 1 tablespoon of bleach to 1 quart (4 cups) of water. For a larger supply, add  cup of bleach to 1 gallon (16 cups) of water. Read labels of cleaning products and follow recommendations provided on product labels. Labels contain instructions for safe and effective use of the cleaning product including precautions you should take when applying the product, such as wearing gloves or eye protection and making sure you have good ventilation during use of the product. Remove gloves and wash hands immediately after cleaning.  Monitor yourself for signs and symptoms of illness Caregivers and household members are considered close contacts, should monitor their health, and will be asked to limit movement outside of the home to the extent possible. Follow the monitoring steps for close contacts listed on the symptom monitoring form.   ? If you have additional questions, contact your local health department or call the epidemiologist on call at [phone] (available 24/7). ? This guidance is subject to change. For the most up-to-date guidance from [facility], please refer to their website: [email or web address]

## 2021-02-19 ENCOUNTER — Other Ambulatory Visit: Payer: Self-pay | Admitting: Family Medicine

## 2021-02-19 DIAGNOSIS — G4709 Other insomnia: Secondary | ICD-10-CM

## 2021-02-19 NOTE — Telephone Encounter (Signed)
PT HAS APPT 02-27-2021.

## 2021-02-19 NOTE — Telephone Encounter (Signed)
Requested medication (s) are due for refill today: yes   Requested medication (s) are on the active medication list: yes  Last refill:  11/23/2020  Future visit scheduled: yes  Notes to clinic:  this refill cannot be delegated    Requested Prescriptions  Pending Prescriptions Disp Refills   QUEtiapine (SEROQUEL) 25 MG tablet [Pharmacy Med Name: QUETIAPINE '25MG'$  TABLETS] 90 tablet 0    Sig: TAKE 1 TABLET(25 MG) BY MOUTH AT BEDTIME      Not Delegated - Psychiatry:  Antipsychotics - Second Generation (Atypical) - quetiapine Failed - 02/19/2021 10:06 AM      Failed - This refill cannot be delegated      Failed - ALT in normal range and within 180 days    ALT  Date Value Ref Range Status  03/05/2020 13 6 - 29 U/L Final          Failed - AST in normal range and within 180 days    AST  Date Value Ref Range Status  03/05/2020 14 10 - 35 U/L Final          Passed - Completed PHQ-2 or PHQ-9 in the last 360 days      Passed - Last BP in normal range    BP Readings from Last 1 Encounters:  11/26/20 134/82          Passed - Valid encounter within last 6 months    Recent Outpatient Visits           2 weeks ago Viral URI   [place], DO   2 months ago Moderate major depression [facility])   [facility] [place], Drue Stager, MD   5 months ago Moderate major depression [facility])   [facility] Steele Sizer, MD   8 months ago Multiple sclerosis, relapsing-remitting [facility])   [facility] Steele Sizer, MD   11 months ago Thrombocytosis [facility])   [facility] Steele Sizer, MD       Future Appointments             In 1 week Steele Sizer, MD [facility], [facility]

## 2021-02-26 NOTE — Progress Notes (Signed)
Name: Robin Chan   MRN: [id number]    DOB: October 05, 1970   Date:02/27/2021       Progress Note  Subjective  Chief Complaint  Follow Up  HPI  ADD: taking medication daily as prescribed, on Adderal XR 20 mg now and she states she has been feeling well with medication  , it helps her stay focused at work. She has only been taking Adderal immediate release on weekends when she sleeps in. She states sometimes XR does not last all day and she crashes in the evenings however Vyvanse still too expensive    MS: diagnosed in 2000, but first symptoms in 1991 - she had numbness  on the right side and weakness.  Currently off medication because of multiple side effects. Has intermittent weakness and has used a cane periodically, for left foot drop.  Adderall helps with energy level. She is currently working full time [employer] . We repeated her MRI brain and plaques are stable. She states heat has made it worse lately. Noticing that right arm is heavier than usual and dropping things at work. She states left foot / leg is not as weak right now    Insomnia: she has been off Ambien, we stopped it on the Spring 2018.  She is taking Seroquel low dose qhs and helps her fall asleep ( takes about 20 minutes ), she is no longer having night terrors. Stable    Major Depression: she was  taking Lexapro for many years and depression was not controlled, we switched to Cymbalta in June 2017, She is separated and divorce finalized Fall 2020. She stopped seeing psychiatrist at [facility] She is taking 90 mg of Duloxetine per day and seroquel at night. She is still under a lot of stress at work, and boyfriend still on probation of DUI, and he is still not working     Asthma: she is taking singulair daily now , she denies wheezing, cough or SOB at this time    AR: she is using Flonase, singulair, zyrtec and xyzal daily  and is doing well, only has an occasional sneezing , nasal congestion is under control at  this time. .   Pre-diabetes: denies polyphagia, polydipsia or polyuria, Last A1C was 5.6%   Pituitary microadenoma: no nipple discharge or double vision, prolactin was normal on last labs, we will recheck labs.  IMPRESSION: 1. Unchanged distribution of white matter lesions consistent with multiple sclerosis. No active demyelinating lesions. 2. 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  Keratoderma bilateral foot: seen by Dr. Cleda Mccreedy and was advised to have surgery but she is not interested   Patient Active Problem List   Diagnosis Date Noted   Incisional hernia, without obstruction or gangrene    Partial small bowel obstruction ([place]) 12/11/2018   Pelvic pain 04/07/2017   Medication management 04/07/2017   Rectocele 07/08/2015   Status post laparoscopic assisted vaginal hysterectomy ([facility]) 07/08/2015   History of hysterectomy for benign disease 05/26/2015   Left lumbar radiculitis 03/28/2015   Breast cancer screening 02/05/2015   Abnormal CAT scan 12/31/2014   ADD (attention deficit disorder) 12/31/2014   Allergic to bees 12/31/2014   Back muscle spasm 12/31/2014   Insomnia, persistent 12/31/2014   Constipation 12/31/2014   Depression, major, recurrent, mild ([place]) 12/31/2014   Personal history of traumatic fracture 12/31/2014   Asthma, mild intermittent 12/31/2014   Migraine with aura and without  status migrainosus, not intractable 12/31/2014   Multiple sclerosis, relapsing-remitting ([place]) 12/31/2014   Endometriosis determined by laparoscopy 12/31/2014   Perennial allergic rhinitis 12/31/2014   Pituitary microadenoma ([place]) 12/31/2014   Vitamin D deficiency 12/31/2014   Acquired trigger finger 12/31/2014    Past Surgical History:  Procedure Laterality Date   ABDOMINAL HYSTERECTOMY  05/26/2015   BREAST BIOPSY Right 2005   benign   INCISIONAL HERNIA REPAIR N/A 10/09/2019   Procedure: HERNIA REPAIR INCISIONAL;   Surgeon: Olean Ree, MD;  Location: [facility] ORS;  Service: General;  Laterality: N/A;   INSERTION OF MESH N/A 10/09/2019   Procedure: INSERTION OF MESH;  Surgeon: Olean Ree, MD;  Location: [facility] ORS;  Service: General;  Laterality: N/A;   LAPAROSCOPIC ASSISTED VAGINAL HYSTERECTOMY N/A 05/26/2015   Procedure: LAPAROSCOPIC ASSISTED VAGINAL HYSTERECTOMY, with right salpingectomy;  Surgeon: Brayton Mars, MD;  Location: [facility] ORS;  Service: Gynecology;  Laterality: N/A;   LAPAROTOMY N/A 12/14/2018   Procedure: EXPLORATORY LAPAROTOMY;  Surgeon: Olean Ree, MD;  Location: [facility] ORS;  Service: General;  Laterality: N/A;   OVARY SURGERY Left 09/30/2014   RECTOCELE REPAIR N/A 05/26/2015   Procedure: POSTERIOR REPAIR (RECTOCELE);  Surgeon: Brayton Mars, MD;  Location: [facility] ORS;  Service: Gynecology;  Laterality: N/A;    Family History  Problem Relation Age of Onset   Anemia Mother    Osteoporosis Mother    Mental illness Father        Bipolar   Arthritis Brother    Breast cancer Maternal Aunt     Social History   Tobacco Use   Smoking status: Some Days    Packs/day: 0.25    Types: Cigarettes   Smokeless tobacco: Never  Substance Use Topics   Alcohol use: Yes    Alcohol/week: 14.0 standard drinks    Types: 14 Shots of liquor per week    Comment: occasionally, 1-2 shots every night     Current Outpatient Medications:    albuterol (VENTOLIN HFA) 108 (90 Base) MCG/ACT inhaler, Inhale 1 puff into the lungs every 6 (six) hours as needed for wheezing or shortness of breath. Reported on 09/26/2015 (Patient taking differently: Inhale 1 puff into the lungs every 6 (six) hours as needed for wheezing or shortness of breath.), Disp: 24 g, Rfl: 0   cetirizine (ZYRTEC) 10 MG tablet, Take 10 mg by mouth at bedtime. , Disp: , Rfl:    Cholecalciferol (VITAMIN D3) 50 MCG (2000 UT) TABS, Take 2,000 Units by mouth daily., Disp: , Rfl:    Cyanocobalamin (VITAMIN B-12 PO), Take 1 tablet by  mouth daily., Disp: , Rfl:    DULoxetine (CYMBALTA) 30 MG capsule, Take 1 capsule (30 mg total) by mouth at bedtime., Disp: 90 capsule, Rfl: 1   DULoxetine (CYMBALTA) 60 MG capsule, Take 1 capsule (60 mg total) by mouth daily., Disp: 90 capsule, Rfl: 1   EPINEPHrine 0.3 mg/0.3 mL IJ SOAJ injection, Inject 0.3 mg into the muscle as needed for anaphylaxis. , Disp: , Rfl:    fluticasone (FLONASE) 50 MCG/ACT nasal spray, Place 2 sprays into both nostrils daily., Disp: 48 g, Rfl: 1   levocetirizine (XYZAL) 5 MG tablet, TAKE 1 TABLET(5 MG) BY MOUTH DAILY, Disp: 90 tablet, Rfl: 1   QUEtiapine (SEROQUEL) 25 MG tablet, TAKE 1 TABLET(25 MG) BY MOUTH AT BEDTIME, Disp: 90 tablet, Rfl: 0   amphetamine-dextroamphetamine (ADDERALL XR) 20 MG 24 hr capsule, Take 1 capsule (20 mg total) by mouth See admin instructions. Fill August 11  th, 2022, Disp: 90 capsule, Rfl: 0   amphetamine-dextroamphetamine (ADDERALL) 10 MG tablet, Take 1 tablet (10 mg total) by mouth daily. On weekends, Disp: 30 tablet, Rfl: 0   montelukast (SINGULAIR) 10 MG tablet, Take 1 tablet (10 mg total) by mouth at bedtime., Disp: 90 tablet, Rfl: 1  Allergies  Allergen Reactions   Penicillins Anaphylaxis    Did it involve swelling of the face/tongue/throat, SOB, or low BP? Yes Did it involve sudden or severe rash/hives, skin peeling, or any reaction on the inside of your mouth or nose? No Did you need to seek medical attention at a hospital or doctor's office? Yes When did it last happen? A long time ago If all above answers are "NO", may proceed with cephalosporin use.   Bee Venom     Bee    I personally reviewed active problem list, medication list, allergies, family history, social history, health maintenance with the patient/caregiver today.   ROS  Constitutional: Negative for fever or weight change.  Respiratory: Negative for cough and shortness of breath.   Cardiovascular: Negative for chest pain or palpitations.   Gastrointestinal: Negative for abdominal pain, no bowel changes.  Musculoskeletal: Negative for gait problem or joint swelling.  Skin: Negative for rash.  Neurological: Negative for dizziness or headache.  No other specific complaints in a complete review of systems (except as listed in HPI above).   Objective  Vitals:   02/27/21 0828  BP: 128/86  Pulse: 88  Resp: 16  Temp: 98.1 F (36.7 C)  SpO2: 96%  Weight: 198 lb (89.8 kg)  Height: '5\' 9"'$  (1.753 m)    Body mass index is 29.24 kg/m.  Physical Exam  Constitutional: Patient appears well-developed and well-nourished. Overweight.  No distress.  HEENT: head atraumatic, normocephalic, pupils equal and reactive to light, neck supple Cardiovascular: Normal rate, regular rhythm and normal heart sounds.  No murmur heard. No BLE edema. Pulmonary/Chest: Effort normal and breath sounds normal. No respiratory distress. Abdominal: Soft.  There is no tenderness. Neurological: left lower leg hyperalgesia, right grip 5/5 but weaker than left, normal gait  Psychiatric: Patient has a normal mood and affect. behavior is normal. Judgment and thought content normal.   PHQ2/9: Depression screen [facility] 2/9 02/27/2021 02/04/2021 11/26/2020 09/10/2020 06/05/2020  Decreased Interest 1 0 0 1 1  Down, Depressed, Hopeless 0 0 0 0 1  PHQ - 2 Score 1 0 0 1 2  Altered sleeping 0 0 2 0 2  Tired, decreased energy 2 0 - 1 3  Change in appetite 3 0 0 0 0  Feeling bad or failure about yourself  0 0 0 0 1  Trouble concentrating 0 0 0 0 1  Moving slowly or fidgety/restless 0 0 0 0 0  Suicidal thoughts 0 0 0 0 0  PHQ-9 Score 6 0 '2 2 9  '$ Difficult doing work/chores - Not difficult at all - - Somewhat difficult  Some recent data might be hidden    phq 9 is positive   Fall Risk: Fall Risk  02/27/2021 02/04/2021 11/26/2020 09/10/2020 06/05/2020  Falls in the past year? 0 0 0 0 0  Number falls in past yr: 0 0 0 0 0  Injury with Fall? 0 0 0 0 0  Comment - - - - -   Risk Factor Category  - - - - -  Comment - - - - -  Risk for fall due to : - - - - -  Follow up - - - - -  Functional Status Survey: Is the patient deaf or have difficulty hearing?: No Does the patient have difficulty seeing, even when wearing glasses/contacts?: No Does the patient have difficulty concentrating, remembering, or making decisions?: Yes Does the patient have difficulty walking or climbing stairs?: Yes Does the patient have difficulty dressing or bathing?: Yes Does the patient have difficulty doing errands alone such as visiting a doctor's office or shopping?: No    Assessment & Plan  1. Multiple sclerosis, relapsing-remitting (HCC)  - COMPLETE METABOLIC PANEL WITH GFR - amphetamine-dextroamphetamine (ADDERALL XR) 20 MG 24 hr capsule; Take 1 capsule (20 mg total) by mouth See admin instructions. Fill August 11 th, 2022  Dispense: 90 capsule; Refill: 0  2. Depression, major, recurrent, mild (HCC)  - amphetamine-dextroamphetamine (ADDERALL) 10 MG tablet; Take 1 tablet (10 mg total) by mouth daily. On weekends  Dispense: 30 tablet; Refill: 0  3. Attention deficit hyperactivity disorder (ADHD), combined type  - amphetamine-dextroamphetamine (ADDERALL) 10 MG tablet; Take 1 tablet (10 mg total) by mouth daily. On weekends  Dispense: 30 tablet; Refill: 0 - amphetamine-dextroamphetamine (ADDERALL XR) 20 MG 24 hr capsule; Take 1 capsule (20 mg total) by mouth See admin instructions. Fill August 11 th, 2022  Dispense: 90 capsule; Refill: 0  4. Pituitary microadenoma (HCC)  - Prolactin  5. Pre-diabetes  - Hemoglobin A1c  6. B12 deficiency  - Vitamin B12  7. Dyslipidemia  - Lipid panel - VITAMIN D 25 Hydroxy (Vit-D Deficiency, Fractures)  8. Mild intermittent asthma without complication  - montelukast (SINGULAIR) 10 MG tablet; Take 1 tablet (10 mg total) by mouth at bedtime.  Dispense: 90 tablet; Refill: 1  9. Perennial allergic rhinitis with seasonal  variation   10. Thrombocytosis  - CBC with Differential/Platelet  11. Keratoderma, acquired   39. Need for hepatitis C screening test  - Hepatitis C Antibody

## 2021-02-27 ENCOUNTER — Other Ambulatory Visit: Payer: Self-pay

## 2021-02-27 ENCOUNTER — Encounter: Payer: Self-pay | Admitting: Family Medicine

## 2021-02-27 ENCOUNTER — Ambulatory Visit (INDEPENDENT_AMBULATORY_CARE_PROVIDER_SITE_OTHER): Payer: BC Managed Care – PPO | Admitting: Family Medicine

## 2021-02-27 VITALS — BP 128/86 | HR 88 | Temp 98.1°F | Resp 16 | Ht 69.0 in | Wt 198.0 lb

## 2021-02-27 DIAGNOSIS — D352 Benign neoplasm of pituitary gland: Secondary | ICD-10-CM

## 2021-02-27 DIAGNOSIS — F902 Attention-deficit hyperactivity disorder, combined type: Secondary | ICD-10-CM | POA: Diagnosis not present

## 2021-02-27 DIAGNOSIS — G35 Multiple sclerosis: Secondary | ICD-10-CM | POA: Diagnosis not present

## 2021-02-27 DIAGNOSIS — D75839 Thrombocytosis, unspecified: Secondary | ICD-10-CM

## 2021-02-27 DIAGNOSIS — J302 Other seasonal allergic rhinitis: Secondary | ICD-10-CM

## 2021-02-27 DIAGNOSIS — J3089 Other allergic rhinitis: Secondary | ICD-10-CM

## 2021-02-27 DIAGNOSIS — L851 Acquired keratosis [keratoderma] palmaris et plantaris: Secondary | ICD-10-CM

## 2021-02-27 DIAGNOSIS — Z1159 Encounter for screening for other viral diseases: Secondary | ICD-10-CM

## 2021-02-27 DIAGNOSIS — J4520 Mild intermittent asthma, uncomplicated: Secondary | ICD-10-CM

## 2021-02-27 DIAGNOSIS — F330 Major depressive disorder, recurrent, mild: Secondary | ICD-10-CM

## 2021-02-27 DIAGNOSIS — E538 Deficiency of other specified B group vitamins: Secondary | ICD-10-CM

## 2021-02-27 DIAGNOSIS — E785 Hyperlipidemia, unspecified: Secondary | ICD-10-CM

## 2021-02-27 DIAGNOSIS — R7303 Prediabetes: Secondary | ICD-10-CM

## 2021-02-27 MED ORDER — MONTELUKAST SODIUM 10 MG PO TABS: 10.0000 mg | ORAL_TABLET | Freq: Every day | ORAL | 1 refills | Status: DC

## 2021-02-27 MED ORDER — AMPHETAMINE-DEXTROAMPHET ER 20 MG PO CP24: 20.0000 mg | ORAL_CAPSULE | ORAL | 0 refills | Status: DC

## 2021-02-27 MED ORDER — AMPHETAMINE-DEXTROAMPHETAMINE 10 MG PO TABS: 10.0000 mg | ORAL_TABLET | Freq: Every day | ORAL | 0 refills | Status: DC

## 2021-03-02 ENCOUNTER — Other Ambulatory Visit: Payer: Self-pay | Admitting: Family Medicine

## 2021-03-02 DIAGNOSIS — J4520 Mild intermittent asthma, uncomplicated: Secondary | ICD-10-CM

## 2021-03-05 DIAGNOSIS — E559 Vitamin D deficiency, unspecified: Secondary | ICD-10-CM | POA: Diagnosis not present

## 2021-03-05 DIAGNOSIS — E785 Hyperlipidemia, unspecified: Secondary | ICD-10-CM | POA: Diagnosis not present

## 2021-03-05 DIAGNOSIS — Z1159 Encounter for screening for other viral diseases: Secondary | ICD-10-CM | POA: Diagnosis not present

## 2021-03-05 DIAGNOSIS — D352 Benign neoplasm of pituitary gland: Secondary | ICD-10-CM | POA: Diagnosis not present

## 2021-03-05 DIAGNOSIS — R7303 Prediabetes: Secondary | ICD-10-CM | POA: Diagnosis not present

## 2021-03-05 DIAGNOSIS — E538 Deficiency of other specified B group vitamins: Secondary | ICD-10-CM | POA: Diagnosis not present

## 2021-03-06 LAB — LIPID PANEL
Cholesterol: 189 mg/dL (ref <200)
HDL: 57 mg/dL (ref >=50)
LDL Cholesterol (Calc): 114 mg/dL (calc) — ABNORMAL HIGH
Non-HDL Cholesterol (Calc): 132 mg/dL (calc) — ABNORMAL HIGH (ref <130)
Total CHOL/HDL Ratio: 3.3 (calc) (ref <5.0)
Triglycerides: 85 mg/dL (ref <150)

## 2021-03-06 LAB — COMPLETE METABOLIC PANEL WITH GFR
AG Ratio: 1.8 (calc) (ref 1.0–2.5)
ALT: 12 U/L (ref 6–29)
AST: 14 U/L (ref 10–35)
Albumin: 4.2 g/dL (ref 3.6–5.1)
Alkaline phosphatase (APISO): 74 U/L (ref 31–125)
BUN: 16 mg/dL (ref 7–25)
CO2: 27 mmol/L (ref 20–32)
Calcium: 9 mg/dL (ref 8.6–10.2)
Chloride: 105 mmol/L (ref 98–110)
Creat: 0.96 mg/dL (ref 0.50–0.99)
Globulin: 2.4 g/dL (calc) (ref 1.9–3.7)
Glucose, Bld: 100 mg/dL — ABNORMAL HIGH (ref 65–99)
Potassium: 4.5 mmol/L (ref 3.5–5.3)
Sodium: 138 mmol/L (ref 135–146)
Total Bilirubin: 0.5 mg/dL (ref 0.2–1.2)
Total Protein: 6.6 g/dL (ref 6.1–8.1)
eGFR: 73 mL/min/{1.73_m2} (ref >=60)

## 2021-03-06 LAB — VITAMIN D 25 HYDROXY (VIT D DEFICIENCY, FRACTURES): Vit D, 25-Hydroxy: 30 ng/mL (ref 30–100)

## 2021-03-06 LAB — CBC WITH DIFFERENTIAL/PLATELET
Absolute Monocytes: 448 cells/uL (ref 200–950)
Basophils Absolute: 47 cells/uL (ref 0–200)
Basophils Relative: 0.8 %
Eosinophils Absolute: 177 cells/uL (ref 15–500)
Eosinophils Relative: 3 %
HCT: 38.5 % (ref 35.0–45.0)
Hemoglobin: 12.5 g/dL (ref 11.7–15.5)
Lymphs Abs: 2230 cells/uL (ref 850–3900)
MCH: 30.6 pg (ref 27.0–33.0)
MCHC: 32.5 g/dL (ref 32.0–36.0)
MCV: 94.1 fL (ref 80.0–100.0)
MPV: 9.8 fL (ref 7.5–12.5)
Monocytes Relative: 7.6 %
Neutro Abs: 2997 cells/uL (ref 1500–7800)
Neutrophils Relative %: 50.8 %
Platelets: 381 10*3/uL (ref 140–400)
RBC: 4.09 10*6/uL (ref 3.80–5.10)
RDW: 12 % (ref 11.0–15.0)
Total Lymphocyte: 37.8 %
WBC: 5.9 10*3/uL (ref 3.8–10.8)

## 2021-03-06 LAB — HEPATITIS C ANTIBODY
Hepatitis C Ab: NONREACTIVE
SIGNAL TO CUT-OFF: 0.01 (ref <1.00)

## 2021-03-06 LAB — HEMOGLOBIN A1C
Hgb A1c MFr Bld: 5.5 % of total Hgb (ref <5.7)
Mean Plasma Glucose: 111 mg/dL
eAG (mmol/L): 6.2 mmol/L

## 2021-03-06 LAB — PROLACTIN: Prolactin: 8.1 ng/mL

## 2021-03-06 LAB — VITAMIN B12: Vitamin B-12: 1402 pg/mL — ABNORMAL HIGH (ref 200–1100)

## 2021-05-19 ENCOUNTER — Encounter: Payer: Self-pay | Admitting: Family Medicine

## 2021-05-19 ENCOUNTER — Other Ambulatory Visit: Payer: Self-pay

## 2021-05-19 DIAGNOSIS — F321 Major depressive disorder, single episode, moderate: Secondary | ICD-10-CM

## 2021-05-19 MED ORDER — DULOXETINE HCL 30 MG PO CPEP: 30.0000 mg | ORAL_CAPSULE | Freq: Every day | ORAL | 1 refills | Status: DC

## 2021-05-28 NOTE — Progress Notes (Signed)
Name: Robin Chan   MRN: [id number]    DOB: Mar 01, 1971   Date:05/29/2021       Progress Note  Subjective  Chief Complaint  Follow Up  HPI  ADD: taking medication daily as prescribed, on Adderal XR 20 mg now and she states she has been feeling well with medication  , it helps her stay focused at work. She has only been taking Adderal immediate release on weekends when she sleeps in. She states sometimes XR does not last all day and she crashes in the evenings however Vyvanse is too expensive and unable to go back to that    MS: diagnosed in 2000, but first symptoms in 1991 - she had numbness  on the right side and weakness.  Currently off medication because of multiple side effects. Has intermittent weakness and has used a cane periodically, for left foot drop.  Adderall helps with energy level. She is currently working full time [employer] . We repeated her MRI brain and plaques are stable.  Noticing that right arm is heavier than usual and dropping things at work. She states left foot / leg is stable. She has intermittent slurred today , she states it gets that way when she is very tired    Insomnia: she has been off Ambien, we stopped it on the Spring 2018.  She is taking Seroquel low dose qhs and helps her fall asleep ( takes about 20 minutes ), she is no longer having night terrors.  Unchanged    Major Depression: she was  taking Lexapro for many years and depression was not controlled, we switched to Cymbalta in June 2017, She is separated and divorce finalized Fall 2020. She stopped seeing psychiatrist at [facility] She ran out of duloxdetine 30 mg so she has been taking only 660 mg and has been feeling more anxious and Phq 9 is higher. She is taking seroquel at night. She has been using a necklace that seh can chew on at work    Asthma: she is taking singulair daily now , she denies wheezing, cough or SOB at this time    AR: she is using Flonase, singulair, zyrtec and  xyzal daily  and  lately she has noticed some post-nasal drainage and congestion    Pre-diabetes: denies polyphagia, polydipsia or polyuria, Last A1C was down to 5.5 %   Pituitary microadenoma: no nipple discharge or double vision, prolactin was normal on last labs  IMPRESSION: 1. Unchanged distribution of white matter lesions consistent with multiple sclerosis. No active demyelinating lesions. 2. 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  Keratoderma bilateral foot: seen by Dr. Cleda Mccreedy and was advised to have surgery but she is not interested at this time, she treats it at home   Patient Active Problem List   Diagnosis Date Noted   Partial small bowel obstruction ([place]) 12/11/2018   Rectocele 07/08/2015   Status post laparoscopic assisted vaginal hysterectomy ([facility]) 07/08/2015   Left lumbar radiculitis 03/28/2015   ADD (attention deficit disorder) 12/31/2014   Allergic to bees 12/31/2014   Insomnia, persistent 12/31/2014   Constipation 12/31/2014   Depression, major, recurrent, mild ([place]) 12/31/2014   Asthma, mild intermittent 12/31/2014   Migraine with aura and without status migrainosus, not intractable 12/31/2014   Multiple sclerosis, relapsing-remitting ([place]) 12/31/2014   Endometriosis determined by laparoscopy 12/31/2014   Perennial allergic rhinitis 12/31/2014   Pituitary microadenoma ([place]) 12/31/2014  Vitamin D deficiency 12/31/2014   Acquired trigger finger 12/31/2014    Past Surgical History:  Procedure Laterality Date   ABDOMINAL HYSTERECTOMY  05/26/2015   BREAST BIOPSY Right 2005   benign   INCISIONAL HERNIA REPAIR N/A 10/09/2019   Procedure: HERNIA REPAIR INCISIONAL;  Surgeon: Olean Ree, MD;  Location: [facility] ORS;  Service: General;  Laterality: N/A;   INSERTION OF MESH N/A 10/09/2019   Procedure: INSERTION OF MESH;  Surgeon: Olean Ree, MD;  Location: [facility];  Service: General;  Laterality:  N/A;   LAPAROSCOPIC ASSISTED VAGINAL HYSTERECTOMY N/A 05/26/2015   Procedure: LAPAROSCOPIC ASSISTED VAGINAL HYSTERECTOMY, with right salpingectomy;  Surgeon: Brayton Mars, MD;  Location: [facility];  Service: Gynecology;  Laterality: N/A;   LAPAROTOMY N/A 12/14/2018   Procedure: EXPLORATORY LAPAROTOMY;  Surgeon: Olean Ree, MD;  Location: [facility];  Service: General;  Laterality: N/A;   OVARY SURGERY Left 09/30/2014   RECTOCELE REPAIR N/A 05/26/2015   Procedure: POSTERIOR REPAIR (RECTOCELE);  Surgeon: Brayton Mars, MD;  Location: [facility];  Service: Gynecology;  Laterality: N/A;    Family History  Problem Relation Age of Onset   Anemia Mother    Osteoporosis Mother    Mental illness Father        Bipolar   Arthritis Brother    Breast cancer Maternal Aunt     Social History   Tobacco Use   Smoking status: Some Days    Packs/day: 0.25    Types: Cigarettes   Smokeless tobacco: Never  Substance Use Topics   Alcohol use: Yes    Alcohol/week: 14.0 standard drinks    Types: 14 Shots of liquor per week    Comment: occasionally, 1-2 shots every night     Current Outpatient Medications:    albuterol (VENTOLIN HFA) 108 (90 Base) MCG/ACT inhaler, Inhale 1 puff into the lungs every 6 (six) hours as needed for wheezing or shortness of breath. Reported on 09/26/2015 (Patient taking differently: Inhale 1 puff into the lungs every 6 (six) hours as needed for wheezing or shortness of breath.), Disp: 24 g, Rfl: 0   amphetamine-dextroamphetamine (ADDERALL XR) 20 MG 24 hr capsule, Take 1 capsule (20 mg total) by mouth See admin instructions. Fill August 11 th, 2022, Disp: 90 capsule, Rfl: 0   amphetamine-dextroamphetamine (ADDERALL) 10 MG tablet, Take 1 tablet (10 mg total) by mouth daily. On weekends, Disp: 30 tablet, Rfl: 0   cetirizine (ZYRTEC) 10 MG tablet, Take 10 mg by mouth at bedtime. , Disp: , Rfl:    Cholecalciferol (VITAMIN D3) 50 MCG (2000 UT) TABS, Take 2,000 Units by  mouth daily., Disp: , Rfl:    Cyanocobalamin (VITAMIN B-12 PO), Take 1 tablet by mouth daily., Disp: , Rfl:    DULoxetine (CYMBALTA) 60 MG capsule, Take 1 capsule (60 mg total) by mouth daily., Disp: 90 capsule, Rfl: 1   EPINEPHrine 0.3 mg/0.3 mL IJ SOAJ injection, Inject 0.3 mg into the muscle as needed for anaphylaxis. , Disp: , Rfl:    fluticasone (FLONASE) 50 MCG/ACT nasal spray, Place 2 sprays into both nostrils daily., Disp: 48 g, Rfl: 1   levocetirizine (XYZAL) 5 MG tablet, TAKE 1 TABLET(5 MG) BY MOUTH DAILY, Disp: 90 tablet, Rfl: 1   montelukast (SINGULAIR) 10 MG tablet, Take 1 tablet (10 mg total) by mouth at bedtime., Disp: 90 tablet, Rfl: 1   QUEtiapine (SEROQUEL) 25 MG tablet, TAKE 1 TABLET(25 MG) BY MOUTH AT BEDTIME, Disp: 90 tablet, Rfl: 0   DULoxetine (  CYMBALTA) 30 MG capsule, Take 1 capsule (30 mg total) by mouth at bedtime. (Patient not taking: Reported on 05/29/2021), Disp: 90 capsule, Rfl: 1  Allergies  Allergen Reactions   Penicillins Anaphylaxis    Did it involve swelling of the face/tongue/throat, SOB, or low BP? Yes Did it involve sudden or severe rash/hives, skin peeling, or any reaction on the inside of your mouth or nose? No Did you need to seek medical attention at a hospital or doctor's office? Yes When did it last happen? A long time ago If all above answers are "NO", may proceed with cephalosporin use.   Bee Venom     Bee    I personally reviewed active problem list, medication list, allergies, family history, social history, health maintenance with the patient/caregiver today.   ROS  Constitutional: Negative for fever or weight change.  Respiratory: Negative for cough and shortness of breath.   Cardiovascular: Negative for chest pain or palpitations.  Gastrointestinal: Negative for abdominal pain, no bowel changes.  Musculoskeletal: Negative for gait problem or joint swelling.  Skin: Negative for rash.  Neurological: Negative for dizziness or headache.   No other specific complaints in a complete review of systems (except as listed in HPI above).   Objective  Vitals:   05/29/21 0908  BP: 118/72  Pulse: 81  Resp: 16  Temp: 98 F (36.7 C)  SpO2: 97%  Weight: 198 lb 8 oz (90 kg)  Height: '5\' 9"'  (1.753 m)    Body mass index is 29.31 kg/m.  Physical Exam  Constitutional: Patient appears well-developed and well-nourished.  No distress.  HEENT: head atraumatic, normocephalic, pupils equal and reactive to light,  neck supple Cardiovascular: Normal rate, regular rhythm and normal heart sounds.  No murmur heard. No BLE edema. Pulmonary/Chest: Effort normal and breath sounds normal. No respiratory distress. Abdominal: Soft.  There is no tenderness. Psychiatric: Patient has a normal mood and affect. behavior is normal. Judgment and thought content normal.  Neurological: mild left foot drop, mild slurred speech today   Recent Results (from the past 2160 hour(s))  Lipid panel     Status: Abnormal   Collection Time: 03/05/21 10:22 AM  Result Value Ref Range   Cholesterol 189 <200 mg/dL   HDL 57 > OR = 50 mg/dL   Triglycerides 85 <150 mg/dL   LDL Cholesterol (Calc) 114 (H) mg/dL (calc)    Comment: Reference range: <100 . Desirable range <100 mg/dL for primary prevention;   <70 mg/dL for patients with CHD or diabetic patients  with > or = 2 CHD risk factors. Marland Kitchen LDL-C is now calculated using the Martin-Hopkins  calculation, which is a validated novel method providing  better accuracy than the Friedewald equation in the  estimation of LDL-C.  Cresenciano Genre et al. Annamaria Helling. 0347;425(95): 2061-2068  (http://education.[facility].com/faq/FAQ164)    Total CHOL/HDL Ratio 3.3 <5.0 (calc)   Non-HDL Cholesterol (Calc) 132 (H) <130 mg/dL (calc)    Comment: For patients with diabetes plus 1 major ASCVD risk  factor, treating to a non-HDL-C goal of <100 mg/dL  (LDL-C of <70 mg/dL) is considered a therapeutic  option.   CBC with  Differential/Platelet     Status: None   Collection Time: 03/05/21 10:22 AM  Result Value Ref Range   WBC 5.9 3.8 - 10.8 Thousand/uL   RBC 4.09 3.80 - 5.10 Million/uL   Hemoglobin 12.5 11.7 - 15.5 g/dL   HCT 38.5 35.0 - 45.0 %   MCV 94.1 80.0 - 100.0 fL  MCH 30.6 27.0 - 33.0 pg   MCHC 32.5 32.0 - 36.0 g/dL   RDW 12.0 11.0 - 15.0 %   Platelets 381 140 - 400 Thousand/uL   MPV 9.8 7.5 - 12.5 fL   Neutro Abs 2,997 1,500 - 7,800 cells/uL   Lymphs Abs 2,230 850 - 3,900 cells/uL   Absolute Monocytes 448 200 - 950 cells/uL   Eosinophils Absolute 177 15 - 500 cells/uL   Basophils Absolute 47 0 - 200 cells/uL   Neutrophils Relative % 50.8 %   Total Lymphocyte 37.8 %   Monocytes Relative 7.6 %   Eosinophils Relative 3.0 %   Basophils Relative 0.8 %  COMPLETE METABOLIC PANEL WITH GFR     Status: Abnormal   Collection Time: 03/05/21 10:22 AM  Result Value Ref Range   Glucose, Bld 100 (H) 65 - 99 mg/dL    Comment: .            Fasting reference interval . For someone without known diabetes, a glucose value between 100 and 125 mg/dL is consistent with prediabetes and should be confirmed with a follow-up test. .    BUN 16 7 - 25 mg/dL   Creat 0.96 0.50 - 0.99 mg/dL   eGFR 73 > OR = 60 mL/min/1.2m    Comment: The eGFR is based on the CKD-EPI 2021 equation. To calculate  the new eGFR from a previous Creatinine or Cystatin C result, go to https://www.kidney.org/professionals/ kdoqi/gfr%5Fcalculator    BUN/Creatinine Ratio NOT APPLICABLE 6 - 22 (calc)   Sodium 138 135 - 146 mmol/L   Potassium 4.5 3.5 - 5.3 mmol/L   Chloride 105 98 - 110 mmol/L   CO2 27 20 - 32 mmol/L   Calcium 9.0 8.6 - 10.2 mg/dL   Total Protein 6.6 6.1 - 8.1 g/dL   Albumin 4.2 3.6 - 5.1 g/dL   Globulin 2.4 1.9 - 3.7 g/dL (calc)   AG Ratio 1.8 1.0 - 2.5 (calc)   Total Bilirubin 0.5 0.2 - 1.2 mg/dL   Alkaline phosphatase (APISO) 74 31 - 125 U/L   AST 14 10 - 35 U/L   ALT 12 6 - 29 U/L  Hemoglobin A1c      Status: None   Collection Time: 03/05/21 10:22 AM  Result Value Ref Range   Hgb A1c MFr Bld 5.5 <5.7 % of total Hgb    Comment: For the purpose of screening for the presence of diabetes: . <5.7%       Consistent with the absence of diabetes 5.7-6.4%    Consistent with increased risk for diabetes             (prediabetes) > or =6.5%  Consistent with diabetes . This assay result is consistent with a decreased risk of diabetes. . Currently, no consensus exists regarding use of hemoglobin A1c for diagnosis of diabetes in children. . According to American Diabetes Association (ADA) guidelines, hemoglobin A1c <7.0% represents optimal control in non-pregnant diabetic patients. Different metrics may apply to specific patient populations.  Standards of Medical Care in Diabetes(ADA). .    Mean Plasma Glucose 111 mg/dL   eAG (mmol/L) 6.2 mmol/L  Vitamin B12     Status: Abnormal   Collection Time: 03/05/21 10:22 AM  Result Value Ref Range   Vitamin B-12 1,402 (H) 200 - 1,100 pg/mL  VITAMIN D 25 Hydroxy (Vit-D Deficiency, Fractures)     Status: None   Collection Time: 03/05/21 10:22 AM  Result Value Ref Range   Vit  D, 25-Hydroxy 30 30 - 100 ng/mL    Comment: Vitamin D Status         25-OH Vitamin D: . Deficiency:                    <20 ng/mL Insufficiency:             20 - 29 ng/mL Optimal:                 > or = 30 ng/mL . For 25-OH Vitamin D testing on patients on  D2-supplementation and patients for whom quantitation  of D2 and D3 fractions is required, the [facility](TM) 25-OH VIT D, (D2,D3), LC/MS/MS is recommended: order  code [phone] (patients >35yr). See Note 1 . Note 1 . For additional information, please refer to  http://education.[facility].com/faq/FAQ199  (This link is being provided for informational/ educational purposes only.)   Hepatitis C Antibody     Status: None   Collection Time: 03/05/21 10:22 AM  Result Value Ref Range   Hepatitis C Ab  NON-REACTIVE NON-REACTIVE   SIGNAL TO CUT-OFF 0.01 <1.00    Comment: . HCV antibody was non-reactive. There is no laboratory  evidence of HCV infection. . In most cases, no further action is required. However, if recent HCV exposure is suspected, a test for HCV RNA (test code [id number] is suggested. . For additional information please refer to http://education.[facility].com/faq/FAQ22v1 (This link is being provided for informational/ educational purposes only.) .   Prolactin     Status: None   Collection Time: 03/05/21 10:22 AM  Result Value Ref Range   Prolactin 8.1 ng/mL    Comment:             Reference Range  Females         Non-pregnant        3.0-30.0         Pregnant           10.0-209.0         Postmenopausal      2.0-20.0 . . .      PHQ2/9: [facility] 05/29/2021 02/27/2021 02/04/2021 11/26/2020 09/10/2020  Decreased Interest 1 1 0 0 1  Down, Depressed, Hopeless 1 0 0 0 0  PHQ - 2 Score 2 1 0 0 1  Altered sleeping 0 0 0 2 0  Tired, decreased energy 1 2 0 - 1  Change in appetite 2 3 0 0 0  Feeling bad or failure about yourself  0 0 0 0 0  Trouble concentrating 1 0 0 0 0  Moving slowly or fidgety/restless 1 0 0 0 0  Suicidal thoughts 0 0 0 0 0  PHQ-9 Score 7 6 0 2 2  Difficult doing work/chores Not difficult at all - Not difficult at all - -  Some recent data might be hidden    phq 9 is positive   Fall Risk: Fall Risk  05/29/2021 02/27/2021 02/04/2021 11/26/2020 09/10/2020  Falls in the past year? 0 0 0 0 0  Number falls in past yr: 0 0 0 0 0  Injury with Fall? 0 0 0 0 0  Comment - - - - -  Risk Factor Category  - - - - -  Comment - - - - -  Risk for fall due to : - - - - -  Follow up - - - - -      Functional Status Survey: Is the patient deaf or have difficulty  hearing?: No Does the patient have difficulty seeing, even when wearing glasses/contacts?: No Does the patient have difficulty concentrating, remembering, or making decisions?:  Yes Does the patient have difficulty walking or climbing stairs?: Yes Does the patient have difficulty dressing or bathing?: No Does the patient have difficulty doing errands alone such as visiting a doctor's office or shopping?: No    Assessment & Plan  1. Multiple sclerosis, relapsing-remitting (HCC)  - amphetamine-dextroamphetamine (ADDERALL XR) 20 MG 24 hr capsule; Take 1 capsule (20 mg total) by mouth See admin instructions. Fill  Nov 8th , 2022  Dispense: 90 capsule; Refill: 0  2. Pituitary microadenoma ([place])  Asymptomatic   3. Depression, major, recurrent, mild (HCC)  - amphetamine-dextroamphetamine (ADDERALL) 10 MG tablet; Take 1 tablet (10 mg total) by mouth daily. On weekends  Dispense: 30 tablet; Refill: 0  4. Pre-diabetes   5. Dyslipidemia   6. Need for influenza vaccination  - Flu Vaccine QUAD 6+ mos PF IM (Fluarix Quad PF)  7. Attention deficit hyperactivity disorder (ADHD), combined type  - amphetamine-dextroamphetamine (ADDERALL) 10 MG tablet; Take 1 tablet (10 mg total) by mouth daily. On weekends  Dispense: 30 tablet; Refill: 0 - amphetamine-dextroamphetamine (ADDERALL XR) 20 MG 24 hr capsule; Take 1 capsule (20 mg total) by mouth See admin instructions. Fill  Nov 8th , 2022  Dispense: 90 capsule; Refill: 0  8. B12 deficiency   9. Perennial allergic rhinitis with seasonal variation   10. Mild intermittent asthma without complication   11. Moderate major depression (HCC)  - DULoxetine (CYMBALTA) 30 MG capsule; Take 1 capsule (30 mg total) by mouth at bedtime.  Dispense: 90 capsule; Refill: 1  12. Other insomnia  - QUEtiapine (SEROQUEL) 25 MG tablet; TAKE 1 TABLET(25 MG) BY MOUTH AT BEDTIME  Dispense: 90 tablet; Refill: 1

## 2021-05-29 ENCOUNTER — Other Ambulatory Visit: Payer: Self-pay

## 2021-05-29 ENCOUNTER — Encounter: Payer: Self-pay | Admitting: Family Medicine

## 2021-05-29 ENCOUNTER — Ambulatory Visit (INDEPENDENT_AMBULATORY_CARE_PROVIDER_SITE_OTHER): Payer: BC Managed Care – PPO | Admitting: Family Medicine

## 2021-05-29 VITALS — BP 118/72 | HR 81 | Temp 98.0°F | Resp 16 | Ht 69.0 in | Wt 198.5 lb

## 2021-05-29 DIAGNOSIS — G35 Multiple sclerosis: Secondary | ICD-10-CM | POA: Diagnosis not present

## 2021-05-29 DIAGNOSIS — R7303 Prediabetes: Secondary | ICD-10-CM | POA: Diagnosis not present

## 2021-05-29 DIAGNOSIS — J3089 Other allergic rhinitis: Secondary | ICD-10-CM

## 2021-05-29 DIAGNOSIS — J4520 Mild intermittent asthma, uncomplicated: Secondary | ICD-10-CM

## 2021-05-29 DIAGNOSIS — E538 Deficiency of other specified B group vitamins: Secondary | ICD-10-CM

## 2021-05-29 DIAGNOSIS — Z23 Encounter for immunization: Secondary | ICD-10-CM

## 2021-05-29 DIAGNOSIS — D352 Benign neoplasm of pituitary gland: Secondary | ICD-10-CM | POA: Diagnosis not present

## 2021-05-29 DIAGNOSIS — E785 Hyperlipidemia, unspecified: Secondary | ICD-10-CM

## 2021-05-29 DIAGNOSIS — F330 Major depressive disorder, recurrent, mild: Secondary | ICD-10-CM | POA: Diagnosis not present

## 2021-05-29 DIAGNOSIS — J302 Other seasonal allergic rhinitis: Secondary | ICD-10-CM

## 2021-05-29 DIAGNOSIS — F902 Attention-deficit hyperactivity disorder, combined type: Secondary | ICD-10-CM

## 2021-05-29 DIAGNOSIS — G4709 Other insomnia: Secondary | ICD-10-CM

## 2021-05-29 DIAGNOSIS — F321 Major depressive disorder, single episode, moderate: Secondary | ICD-10-CM

## 2021-05-29 MED ORDER — DULOXETINE HCL 30 MG PO CPEP
30.0000 mg | ORAL_CAPSULE | Freq: Every day | ORAL | 1 refills | Status: DC
Start: 2021-05-29 — End: 2021-09-01

## 2021-05-29 MED ORDER — AMPHETAMINE-DEXTROAMPHETAMINE 10 MG PO TABS: 10.0000 mg | ORAL_TABLET | Freq: Every day | ORAL | 0 refills | Status: DC

## 2021-05-29 MED ORDER — QUETIAPINE FUMARATE 25 MG PO TABS: ORAL_TABLET | ORAL | 1 refills | Status: DC

## 2021-05-29 MED ORDER — DULOXETINE HCL 60 MG PO CPEP: 60.0000 mg | ORAL_CAPSULE | Freq: Every day | ORAL | 1 refills | Status: DC

## 2021-05-29 MED ORDER — AMPHETAMINE-DEXTROAMPHET ER 20 MG PO CP24
20.0000 mg | ORAL_CAPSULE | ORAL | 0 refills | Status: DC
Start: 2021-05-29 — End: 2021-09-01

## 2021-07-24 ENCOUNTER — Encounter: Payer: BC Managed Care – PPO | Admitting: Family Medicine

## 2021-08-31 NOTE — Progress Notes (Signed)
Name: Robin Chan   MRN: [id number]    DOB: 1971/03/24   Date:09/01/2021       Progress Note  Subjective  Chief Complaint  Follow Up  HPI  ADD: taking medication daily as prescribed, on Adderal XR 20 mg now and she states she has been feeling well with medication  , it helps her stay focused at work. She has only been taking Adderal immediate release on weekends when she sleeps in. She states sometimes XR does not last all day but unable to afford Vyvanse.    MS: diagnosed in 2000, but first symptoms in 1991 - she had numbness  on the right side and weakness.  Currently off medication because of multiple side effects. Has intermittent weakness and has used a cane periodically, for left foot drop.  Adderall helps with energy level. She is currently working full time [employer] . We repeated her MRI brain and plaques were stable.  Weakness not as pronounced on right arm, but still has pain at night.  She states left foot drop is stable    Insomnia: she has been off Ambien, we stopped it on the Spring 2018.  She is taking Seroquel low dose qhs and helps her fall asleep ( takes about 20 minutes ), she is no longer having night terrors.  Continue current medication    Major Depression: she was  taking Lexapro for many years and depression was not controlled, we switched to Cymbalta in June 2017, She is separated and divorce finalized Fall 2020. She stopped seeing psychiatrist [facility] She  is currently doing well on Duloxetine 60 mg , off the 30 mg dose, still taking Seroquel 25 mg at night. Phq 9 has improved    Asthma: she is taking singulair daily now , she denies wheezing, cough or SOB at this time .Unchanged    AR: she is using Flonase, singulair, zyrtec and xyzal daily. She had a head cold a couple of weeks ago and is doing well now    Pre-diabetes: denies polyphagia, polydipsia or polyuria, Last A1C was down to 5.5 % , continue life style modification   Pituitary  microadenoma: no nipple discharge or double vision, prolactin was normal back in August 2023   IMPRESSION: 1. Unchanged distribution of white matter lesions consistent with multiple sclerosis. No active demyelinating lesions. 2. 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  Keratoderma bilateral foot: seen by Dr. Cleda Mccreedy and was advised to have surgery but she is not interested at this time. She has been doing self treatment at home   Patient Active Problem List   Diagnosis Date Noted   Rectocele 07/08/2015   Status post laparoscopic assisted vaginal hysterectomy (LAVH) 07/08/2015   Left lumbar radiculitis 03/28/2015   ADD (attention deficit disorder) 12/31/2014   Allergic to bees 12/31/2014   Insomnia, persistent 12/31/2014   Constipation 12/31/2014   Depression, major, recurrent, mild ([place]) 12/31/2014   Asthma, mild intermittent 12/31/2014   Migraine with aura and without status migrainosus, not intractable 12/31/2014   Multiple sclerosis, relapsing-remitting ([place]) 12/31/2014   Endometriosis determined by laparoscopy 12/31/2014   Perennial allergic rhinitis 12/31/2014   Pituitary microadenoma ([place]) 12/31/2014   Vitamin D deficiency 12/31/2014   Acquired trigger finger 12/31/2014    Past Surgical History:  Procedure Laterality Date   ABDOMINAL HYSTERECTOMY  05/26/2015   BREAST BIOPSY Right 2005   benign   INCISIONAL HERNIA  REPAIR N/A 10/09/2019   Procedure: HERNIA REPAIR INCISIONAL;  Surgeon: Olean Ree, MD;  Location: [facility] ORS;  Service: General;  Laterality: N/A;   INSERTION OF MESH N/A 10/09/2019   Procedure: INSERTION OF MESH;  Surgeon: Olean Ree, MD;  Location: [facility];  Service: General;  Laterality: N/A;   LAPAROSCOPIC ASSISTED VAGINAL HYSTERECTOMY N/A 05/26/2015   Procedure: LAPAROSCOPIC ASSISTED VAGINAL HYSTERECTOMY, with right salpingectomy;  Surgeon: Brayton Mars, MD;  Location: [facility] OR[facility];   Service: Gynecology;  Laterality: N/A;   LAPAROTOMY N/A 12/14/2018   Procedure: EXPLORATORY LAPAROTOMY;  Surgeon: Olean Ree, MD;  Location: [facility] OR[facility];  Service: General;  Laterality: N/A;   OVARY SURGERY Left 09/30/2014   RECTOCELE REPAIR N/A 05/26/2015   Procedure: POSTERIOR REPAIR (RECTOCELE);  Surgeon: Brayton Mars, MD;  Location: [facility];  Service: Gynecology;  Laterality: N/A;    Family History  Problem Relation Age of Onset   Anemia Mother    Osteoporosis Mother    Mental illness Father        Bipolar   Arthritis Brother    Breast cancer Maternal Aunt     Social History   Tobacco Use   Smoking status: Some Days    Packs/day: 0.25    Types: Cigarettes   Smokeless tobacco: Never  Substance Use Topics   Alcohol use: Yes    Alcohol/week: 14.0 standard drinks    Types: 14 Shots of liquor per week    Comment: occasionally, 1-2 shots every night     Current Outpatient Medications:    albuterol (VENTOLIN HFA) 108 (90 Base) MCG/ACT inhaler, Inhale 1 puff into the lungs every 6 (six) hours as needed for wheezing or shortness of breath. Reported on 09/26/2015 (Patient taking differently: Inhale 1 puff into the lungs every 6 (six) hours as needed for wheezing or shortness of breath.), Disp: 24 g, Rfl: 0   cetirizine (ZYRTEC) 10 MG tablet, Take 10 mg by mouth at bedtime. , Disp: , Rfl:    Cholecalciferol (VITAMIN D3) 50 MCG (2000 UT) TABS, Take 2,000 Units by mouth daily., Disp: , Rfl:    DULoxetine (CYMBALTA) 60 MG capsule, Take 1 capsule (60 mg total) by mouth daily., Disp: 90 capsule, Rfl: 1   EPINEPHrine 0.3 mg/0.3 mL IJ SOAJ injection, Inject 0.3 mg into the muscle as needed for anaphylaxis. , Disp: , Rfl:    fluticasone (FLONASE) 50 MCG/ACT nasal spray, Place 2 sprays into both nostrils daily., Disp: 48 g, Rfl: 1   levocetirizine (XYZAL) 5 MG tablet, TAKE 1 TABLET(5 MG) BY MOUTH DAILY, Disp: 90 tablet, Rfl: 1   amphetamine-dextroamphetamine (ADDERALL XR) 20 MG 24 hr  capsule, Take 1 capsule (20 mg total) by mouth See admin instructions., Disp: 90 capsule, Rfl: 0   amphetamine-dextroamphetamine (ADDERALL) 10 MG tablet, Take 1 tablet (10 mg total) by mouth daily. On weekends, Disp: 30 tablet, Rfl: 0   Cyanocobalamin (VITAMIN B-12 PO), Take 1 tablet by mouth daily. (Patient not taking: Reported on 09/01/2021), Disp: , Rfl:    montelukast (SINGULAIR) 10 MG tablet, Take 1 tablet (10 mg total) by mouth at bedtime., Disp: 90 tablet, Rfl: 1   QUEtiapine (SEROQUEL) 25 MG tablet, TAKE 1 TABLET(25 MG) BY MOUTH AT BEDTIME, Disp: 90 tablet, Rfl: 1  Allergies  Allergen Reactions   Penicillins Anaphylaxis    Did it involve swelling of the face/tongue/throat, SOB, or low BP? Yes Did it involve sudden or severe rash/hives, skin peeling, or any reaction on the inside of your  mouth or nose? No Did you need to seek medical attention at a hospital or doctor's office? Yes When did it last happen? A long time ago If all above answers are NO, may proceed with cephalosporin use.   Bee Venom     Bee    I personally reviewed active problem list, medication list, allergies, family history, social history, health maintenance with the patient/caregiver today.   ROS  Constitutional: Negative for fever or weight change.  Respiratory: Negative for cough and shortness of breath.   Cardiovascular: Negative for chest pain or palpitations.  Gastrointestinal: Negative for abdominal pain, no bowel changes.  Musculoskeletal: Negative for gait problem or joint swelling.  Skin: Negative for rash.  Neurological: Negative for dizziness or headache.  No other specific complaints in a complete review of systems (except as listed in HPI above).   Objective  Vitals:   09/01/21 0917  BP: 134/86  Pulse: 97  Resp: 16  SpO2: 98%  Weight: 196 lb (88.9 kg)  Height: 5\' 9"  (1.753 m)    Body mass index is 28.94 kg/m.  Physical Exam  Constitutional: Patient appears well-developed and  well-nourished. Overweight.  No distress.  HEENT: head atraumatic, normocephalic, pupils equal and reactive to light, neck supple Cardiovascular: Normal rate, regular rhythm and normal heart sounds.  No murmur heard. No BLE edema. Pulmonary/Chest: Effort normal and breath sounds normal. No respiratory distress. Abdominal: Soft.  There is no tenderness. Psychiatric: Patient has a normal mood and affect. behavior is normal. Judgment and thought content normal.   PHQ2/9: Depression screen [facility] 2/9 09/01/2021 05/29/2021 02/27/2021 02/04/2021 11/26/2020  Decreased Interest 0 1 1 0 0  Down, Depressed, Hopeless 0 1 0 0 0  PHQ - 2 Score 0 2 1 0 0  Altered sleeping 0 0 0 0 2  Tired, decreased energy 0 1 2 0 -  Change in appetite 0 2 3 0 0  Feeling bad or failure about yourself  0 0 0 0 0  Trouble concentrating 0 1 0 0 0  Moving slowly or fidgety/restless 0 1 0 0 0  Suicidal thoughts 0 0 0 0 0  PHQ-9 Score 0 7 6 0 2  Difficult doing work/chores - Not difficult at all - Not difficult at all -  Some recent data might be hidden    phq 9 is negative   Fall Risk: Fall Risk  09/01/2021 05/29/2021 02/27/2021 02/04/2021 11/26/2020  Falls in the past year? 0 0 0 0 0  Number falls in past yr: 0 0 0 0 0  Injury with Fall? 0 0 0 0 0  Comment - - - - -  Risk Factor Category  - - - - -  Comment - - - - -  Risk for fall due to : No Fall Risks - - - -  Follow up Falls prevention discussed - - - -      Functional Status Survey: Is the patient deaf or have difficulty hearing?: No Does the patient have difficulty seeing, even when wearing glasses/contacts?: No Does the patient have difficulty concentrating, remembering, or making decisions?: Yes Does the patient have difficulty walking or climbing stairs?: No Does the patient have difficulty dressing or bathing?: No Does the patient have difficulty doing errands alone such as visiting a doctor's office or shopping?: No    Assessment & Plan  1. Multiple  sclerosis, relapsing-remitting (HCC)  - amphetamine-dextroamphetamine (ADDERALL XR) 20 MG 24 hr capsule; Take 1 capsule (20 mg total)  by mouth See admin instructions.  Dispense: 90 capsule; Refill: 0  2. Attention deficit hyperactivity disorder (ADHD), combined type  - amphetamine-dextroamphetamine (ADDERALL XR) 20 MG 24 hr capsule; Take 1 capsule (20 mg total) by mouth See admin instructions.  Dispense: 90 capsule; Refill: 0 - amphetamine-dextroamphetamine (ADDERALL) 10 MG tablet; Take 1 tablet (10 mg total) by mouth daily. On weekends  Dispense: 30 tablet; Refill: 0  3. Depression, major, recurrent, mild (HCC)  - amphetamine-dextroamphetamine (ADDERALL) 10 MG tablet; Take 1 tablet (10 mg total) by mouth daily. On weekends  Dispense: 30 tablet; Refill: 0  4. Mild intermittent asthma without complication  - montelukast (SINGULAIR) 10 MG tablet; Take 1 tablet (10 mg total) by mouth at bedtime.  Dispense: 90 tablet; Refill: 1  5. Other insomnia  - QUEtiapine (SEROQUEL) 25 MG tablet; TAKE 1 TABLET(25 MG) BY MOUTH AT BEDTIME  Dispense: 90 tablet; Refill: 1  6. Pituitary microadenoma (HCC)   Last level was normal

## 2021-09-01 ENCOUNTER — Encounter: Payer: Self-pay | Admitting: Family Medicine

## 2021-09-01 ENCOUNTER — Ambulatory Visit: Payer: BC Managed Care – PPO | Admitting: Family Medicine

## 2021-09-01 VITALS — BP 134/86 | HR 97 | Resp 16 | Ht 69.0 in | Wt 196.0 lb

## 2021-09-01 DIAGNOSIS — G4709 Other insomnia: Secondary | ICD-10-CM

## 2021-09-01 DIAGNOSIS — D352 Benign neoplasm of pituitary gland: Secondary | ICD-10-CM

## 2021-09-01 DIAGNOSIS — F902 Attention-deficit hyperactivity disorder, combined type: Secondary | ICD-10-CM | POA: Diagnosis not present

## 2021-09-01 DIAGNOSIS — F330 Major depressive disorder, recurrent, mild: Secondary | ICD-10-CM | POA: Diagnosis not present

## 2021-09-01 DIAGNOSIS — G35 Multiple sclerosis: Secondary | ICD-10-CM

## 2021-09-01 DIAGNOSIS — J4520 Mild intermittent asthma, uncomplicated: Secondary | ICD-10-CM

## 2021-09-01 MED ORDER — QUETIAPINE FUMARATE 25 MG PO TABS: ORAL_TABLET | ORAL | 1 refills | Status: DC

## 2021-09-01 MED ORDER — MONTELUKAST SODIUM 10 MG PO TABS: 10.0000 mg | ORAL_TABLET | Freq: Every day | ORAL | 1 refills | Status: DC

## 2021-09-01 MED ORDER — AMPHETAMINE-DEXTROAMPHETAMINE 10 MG PO TABS: 10.0000 mg | ORAL_TABLET | Freq: Every day | ORAL | 0 refills | Status: DC

## 2021-09-01 MED ORDER — AMPHETAMINE-DEXTROAMPHET ER 20 MG PO CP24: 20.0000 mg | ORAL_CAPSULE | ORAL | 0 refills | Status: DC

## 2021-10-09 NOTE — Patient Instructions (Signed)

## 2021-10-09 NOTE — Progress Notes (Signed)
Name: Robin Chan   MRN: [id number]    DOB: 03/16/1971   Date:10/12/2021 ? ?     Progress Note ? ?Subjective ? ?Chief Complaint ? ?Annual Exam ? ?HPI ? ?Patient presents for annual CPE. ? ?Diet: packs lunch for work and takes a fruit, crackers and some form of protein, for dinner usually a vegetable with protein  ?Exercise: she is active at work, lots of steps but not doing any organized physical activity   ? ?[place] [facility] Visit from 10/12/2021 in [facility]  ?AUDIT-C Score 3  ? ?  ? ?Depression: Phq 9 is  negative ?Depression screen [facility] 2/9 10/12/2021 09/01/2021 05/29/2021 02/27/2021 02/04/2021  ?Decreased Interest 0 0 1 1 0  ?Down, Depressed, Hopeless 0 0 1 0 0  ?PHQ - 2 Score 0 0 2 1 0  ?Altered sleeping 0 0 0 0 0  ?Tired, decreased energy 0 0 1 2 0  ?Change in appetite 0 0 2 3 0  ?Feeling bad or failure about yourself  0 0 0 0 0  ?Trouble concentrating 0 0 1 0 0  ?Moving slowly or fidgety/restless 0 0 1 0 0  ?Suicidal thoughts 0 0 0 0 0  ?PHQ-9 Score 0 0 7 6 0  ?Difficult doing work/chores - - Not difficult at all - Not difficult at all  ?Some recent data might be hidden  ? ?Hypertension: ?BP Readings from Last 3 Encounters:  ?10/12/21 134/86  ?09/01/21 134/86  ?05/29/21 118/72  ? ?Obesity: ?Wt Readings from Last 3 Encounters:  ?10/12/21 197 lb (89.4 kg)  ?09/01/21 196 lb (88.9 kg)  ?05/29/21 198 lb 8 oz (90 kg)  ? ?BMI Readings from Last 3 Encounters:  ?10/12/21 29.09 kg/m?  ?09/01/21 28.94 kg/m?  ?05/29/21 29.31 kg/m?  ?  ? ?Vaccines:  ? ?HPV: N/A ?Tdap: up to date ?Shingrix: discussed with patient, she will check coverage with insurance ?Pneumonia: up to date  ?Flu: up to date ?COVID-19: discussed booster  ? ? ?Hep C Screening: 03/05/21 ?STD testing and prevention (HIV/chl/gon/syphilis): 12/31/14 ?Intimate partner violence: negative ?Sexual History : same partner, female, does not use condoms  ?Menstrual History/LMP/Abnormal Bleeding: s/p hysterectomy  ?Discussed importance of follow up if  any post-menopausal bleeding: not applicable ?Incontinence Symptoms: only when bladder is very full and has to cough or sneeze  ? ?Breast cancer:  ?- Last Mammogram: Ordered today ?- BRCA gene screening: paternal grandmother had ovarian cancer , one maternal aunt had breast cancer. She did 23 and me screen and it was negative  ? ?Osteoporosis Prevention : Discussed high calcium and vitamin D supplementation, weight bearing exercises ?Bone density :not applicable ? ?Cervical cancer screening: N/A - s/p hysterectomy for benign causes  ? ?Skin cancer: Discussed monitoring for atypical lesions  ?Colorectal cancer: Ordered today  - she prefers cologuard  ?Lung cancer:  Low Dose CT Chest recommended if Age 65-80 years, 20 pack-year currently smoking OR have quit w/in 15years. Patient does not qualify.   ?ECG: 12/18/18 ? ?Advanced Care Planning: A voluntary discussion about advance care planning including the explanation and discussion of advance directives.  Discussed health care proxy and Living will, and the patient was able to identify a health care proxy as Merrily Pew - oldest son.  Patient does not have a living will or power of attorney of health care  ? ?Lipids: ?Lab Results  ?Component Value Date  ? CHOL 189 03/05/2021  ? CHOL 190 03/05/2020  ? CHOL 194 02/02/2019  ? ?  Lab Results  ?Component Value Date  ? HDL 57 03/05/2021  ? HDL 53 03/05/2020  ? HDL 52 02/02/2019  ? ?Lab Results  ?Component Value Date  ? LDLCALC 114 (H) 03/05/2021  ? LDLCALC 117 (H) 03/05/2020  ? LDLCALC 121 (H) 02/02/2019  ? ?Lab Results  ?Component Value Date  ? TRIG 85 03/05/2021  ? TRIG 96 03/05/2020  ? TRIG 105 02/02/2019  ? ?Lab Results  ?Component Value Date  ? CHOLHDL 3.3 03/05/2021  ? CHOLHDL 3.6 03/05/2020  ? CHOLHDL 3.7 02/02/2019  ? ?No results found for: LDLDIRECT ? ?Glucose: ?Glucose  ?Date Value Ref Range Status  ?02/02/2019 98 65 - 99 mg/dL Final  ? ?Glucose, Bld  ?Date Value Ref Range Status  ?03/05/2021 100 (H) 65 - 99 mg/dL Final  ?   Comment:  ?  . ?           Fasting reference interval ?. ?For someone without known diabetes, a glucose value ?between 100 and 125 mg/dL is consistent with ?prediabetes and should be confirmed with a ?follow-up test. ?. ?  ?03/05/2020 102 (H) 65 - 99 mg/dL Final  ?  Comment:  ?  . ?           Fasting reference interval ?. ?For someone without known diabetes, a glucose value ?between 100 and 125 mg/dL is consistent with ?prediabetes and should be confirmed with a ?follow-up test. ?. ?  ?12/17/2018 84 70 - 99 mg/dL Final  ? ? ?Patient Active Problem List  ? Diagnosis Date Noted  ? Rectocele 07/08/2015  ? Status post laparoscopic assisted vaginal hysterectomy (LAVH) 07/08/2015  ? Left lumbar radiculitis 03/28/2015  ? ADD (attention deficit disorder) 12/31/2014  ? Allergic to bees 12/31/2014  ? Insomnia, persistent 12/31/2014  ? Constipation 12/31/2014  ? Depression, major, recurrent, mild ([place]) 12/31/2014  ? Asthma, mild intermittent 12/31/2014  ? Migraine with aura and without status migrainosus, not intractable 12/31/2014  ? Multiple sclerosis, relapsing-remitting ([place]) 12/31/2014  ? Endometriosis determined by laparoscopy 12/31/2014  ? Perennial allergic rhinitis 12/31/2014  ? Pituitary microadenoma ([place]) 12/31/2014  ? Vitamin D deficiency 12/31/2014  ? Acquired trigger finger 12/31/2014  ? ? ?Past Surgical History:  ?Procedure Laterality Date  ? ABDOMINAL HYSTERECTOMY  05/26/2015  ? BREAST BIOPSY Right 2005  ? benign  ? INCISIONAL HERNIA REPAIR N/A 10/09/2019  ? Procedure: HERNIA REPAIR INCISIONAL;  Surgeon: Olean Ree, MD;  Location: [facility];  Service: General;  Laterality: N/A;  ? INSERTION OF MESH N/A 10/09/2019  ? Procedure: INSERTION OF MESH;  Surgeon: Olean Ree, MD;  Location: [facility];  Service: General;  Laterality: N/A;  ? LAPAROSCOPIC ASSISTED VAGINAL HYSTERECTOMY N/A 05/26/2015  ? Procedure: LAPAROSCOPIC ASSISTED VAGINAL HYSTERECTOMY, with right salpingectomy;  Surgeon: Brayton Mars, MD;   Location: [facility];  Service: Gynecology;  Laterality: N/A;  ? LAPAROTOMY N/A 12/14/2018  ? Procedure: EXPLORATORY LAPAROTOMY;  Surgeon: Olean Ree, MD;  Location: [facility];  Service: General;  Laterality: N/A;  ? OVARY SURGERY Left 09/30/2014  ? RECTOCELE REPAIR N/A 05/26/2015  ? Procedure: POSTERIOR REPAIR (RECTOCELE);  Surgeon: Brayton Mars, MD;  Location: [facility];  Service: Gynecology;  Laterality: N/A;  ? ? ?Family History  ?Problem Relation Age of Onset  ? Anemia Mother   ? Osteoporosis Mother   ? Mental illness Father   ?     Bipolar  ? Arthritis Brother   ? Breast cancer Maternal Aunt   ? ? ?Social History  ? ?  Socioeconomic History  ? Marital status: Significant Other  ?  Spouse name: Not on file  ? Number of children: 2  ? Years of education: Not on file  ? Highest education level: Associate degree: academic program  ?Occupational History  ? Occupation: Proofreader   ?  Comment: [employer]   ?Tobacco Use  ? Smoking status: Some Days  ?  Packs/day: 0.25  ?  Types: Cigarettes  ? Smokeless tobacco: Never  ?Vaping Use  ? Vaping Use: Former  ? Substances: CBD, Flavoring  ?Substance and Sexual Activity  ? Alcohol use: Yes  ?  Alcohol/week: 14.0 standard drinks  ?  Types: 14 Shots of liquor per week  ?  Comment: occasionally, 1-2 shots every night  ? Drug use: No  ? Sexual activity: Yes  ?  Partners: Male  ?  Birth control/protection: None  ?  Comment: separated   ?Other Topics Concern  ? Not on file  ?Social History Narrative  ? She is from [place].   ? Moved to [place] in 1998 because of husband's job transfer, that is when she stopped working -pending her green-card.  ? 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   ? She had symptoms of MS in 2000 , daughter was 75 year old and just now found a job at [employer] March 2019  ? Separated since July 2019   ?   ? ?Social Determinants of Health  ? ?Financial Resource Strain: Low Risk   ? Difficulty of Paying Living Expenses: Not hard at all  ?Food Insecurity: No  Food Insecurity  ? Worried About Charity fundraiser in the Last Year: Never true  ? Ran Out of Food in the Last Year: Never true  ?Transportation Needs: No Transportation Needs  ? Lack of Transportation

## 2021-10-12 ENCOUNTER — Ambulatory Visit (INDEPENDENT_AMBULATORY_CARE_PROVIDER_SITE_OTHER): Payer: BC Managed Care – PPO | Admitting: Family Medicine

## 2021-10-12 ENCOUNTER — Encounter: Payer: Self-pay | Admitting: Family Medicine

## 2021-10-12 VITALS — BP 134/86 | HR 100 | Resp 16 | Ht 69.0 in | Wt 197.0 lb

## 2021-10-12 DIAGNOSIS — Z0000 Encounter for general adult medical examination without abnormal findings: Secondary | ICD-10-CM | POA: Diagnosis not present

## 2021-10-12 DIAGNOSIS — Z1211 Encounter for screening for malignant neoplasm of colon: Secondary | ICD-10-CM | POA: Diagnosis not present

## 2021-10-12 DIAGNOSIS — Z23 Encounter for immunization: Secondary | ICD-10-CM

## 2021-10-12 DIAGNOSIS — Z1231 Encounter for screening mammogram for malignant neoplasm of breast: Secondary | ICD-10-CM

## 2021-11-04 DIAGNOSIS — Z23 Encounter for immunization: Secondary | ICD-10-CM | POA: Diagnosis not present

## 2021-11-11 LAB — COLOGUARD: COLOGUARD: NEGATIVE

## 2021-11-26 DIAGNOSIS — D352 Benign neoplasm of pituitary gland: Secondary | ICD-10-CM | POA: Diagnosis not present

## 2021-11-27 NOTE — Progress Notes (Signed)
Name: Robin Chan   MRN: [id number]    DOB: February 24, 1971   Date:11/30/2021 ? ?     Progress Note ? ?Subjective ? ?Chief Complaint ? ?Follow Up ? ?HPI ? ?ADD: taking medication daily as prescribed, on Adderal XR 20 mg now and she states she has been feeling well with medication  , it helps her stay focused at work. She has only been taking Adderal immediate release on weekends when she sleeps in and still has medication at home. She states sometimes XR does not last all day but unable to afford Vyvanse.  ?  ?MS: diagnosed in 2000, but first symptoms in 1991 - she had numbness  on the right side and weakness.  Currently off medication because of multiple side effects. Has intermittent weakness and has used a cane periodically, for left foot drop.  Adderall helps with energy level. She is currently working full time [employer] . We repeated her MRI brain and plaques were stable.  Weakness not as pronounced on right arm, but still has pain at night.  She states left foot drop is stable. Symptoms flares during the Summer   ?  ?Insomnia: she has been off Ambien, we stopped it on the Spring 2018.  She is taking Seroquel low dose qhs and helps her fall asleep ( takes about 20 minutes ), she is able to stay asleep on medication  ?  ?Major Depression: she was  taking Lexapro for many years and depression was not controlled, we switched to Cymbalta in June 2017, She is separated and divorce finalized Fall 2020. She stopped seeing psychiatrist at [facility] She is currently doing well on Duloxetine 60 mg , off the 30 mg dose due to insurance , still taking Seroquel 25 mg at night. Phq 9 is worse today, states the weather is up and down and her boyfriend's brother recently diagnosed with pancreatic cancer . She states she was fine on Duloxetine 60 mg prior to the past two weeks.  ?  ?Asthma: she is taking singulair daily now , she denies wheezing, cough or SOB at this time . Stable  ?  ?AR: she is using Flonase,  singulair, zyrtec and xyzal daily. Stable, sometimes forgets to use Flonase  ?  ?Pre-diabetes: denies polyphagia, polydipsia or polyuria, Last A1C was down to 5.5 % , on life style modifications and doing well  ? ?Pituitary microadenoma: no nipple discharge or double vision, prolactin was normal back in August 2022 ? ?IMPRESSION: ?1. Unchanged distribution of white matter lesions consistent with ?multiple sclerosis. No active demyelinating lesions. ?2. Unchanged 8 mm cystic structure at the superior aspect of the ?pituitary gland, likely a Rathke's cleft cyst. ? ?Keratoderma bilateral foot: seen by Dr. Cleda Mccreedy and was advised to have surgery but she is not interested at this time.  Unchanged  ? ?Patient Active Problem List  ? Diagnosis Date Noted  ? Essential (hemorrhagic) thrombocythemia ([place]) 11/30/2021  ? Rectocele 07/08/2015  ? Status post laparoscopic assisted vaginal hysterectomy (LAVH) 07/08/2015  ? Left lumbar radiculitis 03/28/2015  ? ADD (attention deficit disorder) 12/31/2014  ? Allergic to bees 12/31/2014  ? Insomnia, persistent 12/31/2014  ? Constipation 12/31/2014  ? Depression, major, recurrent, mild ([place]) 12/31/2014  ? Asthma, mild intermittent 12/31/2014  ? Migraine with aura and without status migrainosus, not intractable 12/31/2014  ? Multiple sclerosis, relapsing-remitting ([place]) 12/31/2014  ? Endometriosis determined by laparoscopy 12/31/2014  ? Perennial allergic rhinitis 12/31/2014  ? Pituitary microadenoma ([place]) 12/31/2014  ?  Vitamin D deficiency 12/31/2014  ? Acquired trigger finger 12/31/2014  ? ? ?Past Surgical History:  ?Procedure Laterality Date  ? ABDOMINAL HYSTERECTOMY  05/26/2015  ? BREAST BIOPSY Right 2005  ? benign  ? INCISIONAL HERNIA REPAIR N/A 10/09/2019  ? Procedure: HERNIA REPAIR INCISIONAL;  Surgeon: Olean Ree, MD;  Location: [facility] ORS;  Service: General;  Laterality: N/A;  ? INSERTION OF MESH N/A 10/09/2019  ? Procedure: INSERTION OF MESH;  Surgeon: Olean Ree, MD;   Location: [facility];  Service: General;  Laterality: N/A;  ? LAPAROSCOPIC ASSISTED VAGINAL HYSTERECTOMY N/A 05/26/2015  ? Procedure: LAPAROSCOPIC ASSISTED VAGINAL HYSTERECTOMY, with right salpingectomy;  Surgeon: Brayton Mars, MD;  Location: [facility];  Service: Gynecology;  Laterality: N/A;  ? LAPAROTOMY N/A 12/14/2018  ? Procedure: EXPLORATORY LAPAROTOMY;  Surgeon: Olean Ree, MD;  Location: [facility];  Service: General;  Laterality: N/A;  ? OVARY SURGERY Left 09/30/2014  ? RECTOCELE REPAIR N/A 05/26/2015  ? Procedure: POSTERIOR REPAIR (RECTOCELE);  Surgeon: Brayton Mars, MD;  Location: [facility];  Service: Gynecology;  Laterality: N/A;  ? ? ?Family History  ?Problem Relation Age of Onset  ? Anemia Mother   ? Osteoporosis Mother   ? Mental illness Father   ?     Bipolar  ? Arthritis Brother   ? Breast cancer Maternal Aunt   ? ? ?Social History  ? ?Tobacco Use  ? Smoking status: Some Days  ?  Packs/day: 0.25  ?  Types: Cigarettes  ? Smokeless tobacco: Never  ?Substance Use Topics  ? Alcohol use: Yes  ?  Alcohol/week: 14.0 standard drinks  ?  Types: 14 Shots of liquor per week  ?  Comment: occasionally, 1-2 shots every night  ? ? ? ?Current Outpatient Medications:  ?  albuterol (VENTOLIN HFA) 108 (90 Base) MCG/ACT inhaler, Inhale 1 puff into the lungs every 6 (six) hours as needed for wheezing or shortness of breath. Reported on 09/26/2015, Disp: 24 g, Rfl: 0 ?  amphetamine-dextroamphetamine (ADDERALL XR) 20 MG 24 hr capsule, Take 1 capsule (20 mg total) by mouth See admin instructions., Disp: 90 capsule, Rfl: 0 ?  amphetamine-dextroamphetamine (ADDERALL) 10 MG tablet, Take 1 tablet (10 mg total) by mouth daily. On weekends, Disp: 30 tablet, Rfl: 0 ?  cetirizine (ZYRTEC) 10 MG tablet, Take 10 mg by mouth at bedtime. , Disp: , Rfl:  ?  Cholecalciferol (VITAMIN D3) 50 MCG (2000 UT) TABS, Take 2,000 Units by mouth daily., Disp: , Rfl:  ?  DULoxetine (CYMBALTA) 60 MG capsule, Take 1 capsule (60 mg total) by  mouth daily., Disp: 90 capsule, Rfl: 1 ?  EPINEPHrine 0.3 mg/0.3 mL IJ SOAJ injection, Inject 0.3 mg into the muscle as needed for anaphylaxis. , Disp: , Rfl:  ?  fluticasone (FLONASE) 50 MCG/ACT nasal spray, Place 2 sprays into both nostrils daily., Disp: 48 g, Rfl: 1 ?  levocetirizine (XYZAL) 5 MG tablet, TAKE 1 TABLET(5 MG) BY MOUTH DAILY, Disp: 90 tablet, Rfl: 1 ?  montelukast (SINGULAIR) 10 MG tablet, Take 1 tablet (10 mg total) by mouth at bedtime., Disp: 90 tablet, Rfl: 1 ?  QUEtiapine (SEROQUEL) 25 MG tablet, TAKE 1 TABLET(25 MG) BY MOUTH AT BEDTIME, Disp: 90 tablet, Rfl: 1 ? ?Allergies  ?Allergen Reactions  ? Penicillins Anaphylaxis  ?  Did it involve swelling of the face/tongue/throat, SOB, or low BP? Yes ?Did it involve sudden or severe rash/hives, skin peeling, or any reaction on the inside of your mouth or nose? No ?  Did you need to seek medical attention at a hospital or doctor's office? Yes ?When did it last happen? A long time ago ?If all above answers are ?NO?, may proceed with cephalosporin use.  ? Bee Venom   ?  Bee  ? ? ?I personally reviewed active problem list, medication list, allergies, family history, social history, health maintenance with the patient/caregiver today. ? ? ?ROS ? ?Constitutional: Negative for fever or weight change.  ?Respiratory: Negative for cough and shortness of breath.   ?Cardiovascular: Negative for chest pain or palpitations.  ?Gastrointestinal: Negative for abdominal pain, no bowel changes.  ?Musculoskeletal: Negative for gait problem or joint swelling.  ?Skin: Negative for rash.  ?Neurological: Negative for dizziness or headache.  ?No other specific complaints in a complete review of systems (except as listed in HPI above).  ? ?Objective ? ?Vitals:  ? 11/30/21 0825  ?BP: 130/70  ?Pulse: 90  ?Resp: 16  ?SpO2: 99%  ?Weight: 195 lb (88.5 kg)  ?Height: '5\' 9"'$  (1.753 m)  ? ? ?Body mass index is 28.8 kg/m?. ? ?Physical Exam ? ?Constitutional: Patient appears well-developed  and well-nourished.  No distress.  ?HEENT: head atraumatic, normocephalic, pupils equal and reactive to light, neck supple ?Cardiovascular: Normal rate, regular rhythm and normal heart sounds.  No murmur heard.

## 2021-11-30 ENCOUNTER — Ambulatory Visit (INDEPENDENT_AMBULATORY_CARE_PROVIDER_SITE_OTHER): Payer: BC Managed Care – PPO | Admitting: Family Medicine

## 2021-11-30 ENCOUNTER — Encounter: Payer: Self-pay | Admitting: Family Medicine

## 2021-11-30 VITALS — BP 130/70 | HR 90 | Resp 16 | Ht 69.0 in | Wt 195.0 lb

## 2021-11-30 DIAGNOSIS — F330 Major depressive disorder, recurrent, mild: Secondary | ICD-10-CM

## 2021-11-30 DIAGNOSIS — Z23 Encounter for immunization: Secondary | ICD-10-CM

## 2021-11-30 DIAGNOSIS — R7303 Prediabetes: Secondary | ICD-10-CM

## 2021-11-30 DIAGNOSIS — F321 Major depressive disorder, single episode, moderate: Secondary | ICD-10-CM

## 2021-11-30 DIAGNOSIS — J4520 Mild intermittent asthma, uncomplicated: Secondary | ICD-10-CM

## 2021-11-30 DIAGNOSIS — D473 Essential (hemorrhagic) thrombocythemia: Secondary | ICD-10-CM | POA: Diagnosis not present

## 2021-11-30 DIAGNOSIS — G35 Multiple sclerosis: Secondary | ICD-10-CM | POA: Diagnosis not present

## 2021-11-30 DIAGNOSIS — D352 Benign neoplasm of pituitary gland: Secondary | ICD-10-CM

## 2021-11-30 DIAGNOSIS — E538 Deficiency of other specified B group vitamins: Secondary | ICD-10-CM

## 2021-11-30 DIAGNOSIS — F902 Attention-deficit hyperactivity disorder, combined type: Secondary | ICD-10-CM | POA: Diagnosis not present

## 2021-11-30 DIAGNOSIS — Z1211 Encounter for screening for malignant neoplasm of colon: Secondary | ICD-10-CM

## 2021-11-30 DIAGNOSIS — E785 Hyperlipidemia, unspecified: Secondary | ICD-10-CM

## 2021-11-30 MED ORDER — AMPHETAMINE-DEXTROAMPHET ER 20 MG PO CP24: 20.0000 mg | ORAL_CAPSULE | ORAL | 0 refills | Status: DC

## 2021-11-30 MED ORDER — DULOXETINE HCL 60 MG PO CPEP: 60.0000 mg | ORAL_CAPSULE | Freq: Every day | ORAL | 1 refills | Status: DC

## 2021-11-30 NOTE — Assessment & Plan Note (Signed)
No longer seeing neurologist  ?Last MRI brain 2021 and lesions were stable ?No on medications ?

## 2021-11-30 NOTE — Assessment & Plan Note (Signed)
A little worse today, she does not want to change or adjust medications  ?

## 2021-11-30 NOTE — Assessment & Plan Note (Signed)
Doing well on current regiment  

## 2021-11-30 NOTE — Assessment & Plan Note (Signed)
Platelets was elevated for many years, but normal last August we will continue to monitor  ? ?

## 2021-11-30 NOTE — Assessment & Plan Note (Signed)
She stopped seeing Endo years ago ?Last prolactin was normal ?Denies any symptoms  ?

## 2021-12-18 ENCOUNTER — Encounter: Payer: Self-pay | Admitting: Family Medicine

## 2021-12-23 ENCOUNTER — Other Ambulatory Visit: Payer: Self-pay | Admitting: Nurse Practitioner

## 2021-12-23 DIAGNOSIS — G35 Multiple sclerosis: Secondary | ICD-10-CM

## 2021-12-23 DIAGNOSIS — F902 Attention-deficit hyperactivity disorder, combined type: Secondary | ICD-10-CM

## 2021-12-23 MED ORDER — AMPHETAMINE-DEXTROAMPHET ER 25 MG PO CP24: 25.0000 mg | ORAL_CAPSULE | ORAL | 0 refills | Status: DC

## 2022-02-10 ENCOUNTER — Other Ambulatory Visit: Payer: Self-pay | Admitting: Family Medicine

## 2022-02-10 DIAGNOSIS — J4520 Mild intermittent asthma, uncomplicated: Secondary | ICD-10-CM

## 2022-03-02 ENCOUNTER — Other Ambulatory Visit: Payer: Self-pay | Admitting: Family Medicine

## 2022-03-02 DIAGNOSIS — G4709 Other insomnia: Secondary | ICD-10-CM

## 2022-03-04 NOTE — Progress Notes (Signed)
Name: Robin Chan   MRN: [id number]    DOB: 04/28/1971   Date:03/05/2022       Progress Note  Subjective  Chief Complaint  Follow Up  HPI  ADD: taking medication daily as prescribed, on Adderal XR 20 mg now and she states she has been feeling well with medication  , it helps her stay focused at work. She has only been taking Adderal immediate release on weekends when she sleeps in. She states sometimes XR does not last all day but still unable to afford Vyvanse at this time    MS: diagnosed in 2000, but first symptoms in 1991 - she had numbness  on the right side and weakness.  Currently off medication because of multiple side effects. Has intermittent weakness and has used a cane periodically, for left foot drop.  Adderall helps with energy level. She is currently working full time [employer]. Last MRI done 12/2019 showed that plaques were stable.   Insomnia: she has been off Ambien, we stopped it on the Spring 2018.  She is taking Seroquel low dose qhs and helps her fall asleep ( takes about 20 minutes ), she is able to stay asleep on medication Unchanged    Major Depression: she was  taking Lexapro for many years and depression was not controlled, we switched to Cymbalta in June 2017, She is separated and divorce finalized Fall 2020. She stopped seeing psychiatrist at [facility] She is currently doing well on Duloxetine 60 mg , off the 30 mg dose due to insurance , still taking Seroquel 25 mg at night. Phq 9 is worse today, states the weather is up and down and her boyfriend's brother recently diagnosed with pancreatic cancer . She states she was fine on Duloxetine 60 mg prior to the past two weeks.    Asthma: she is taking singulair daily now , she denies wheezing, cough or SOB at this time .  She has not used albuterol lately    AR: she is using Flonase, singulair, zyrtec and xyzal daily. She states feeling congested since she has been forgetting to use flonase     Pre-diabetes: denies polyphagia, polydipsia or polyuria, Last A1C was down to 5.5 % , on life style modifications and doing well . Continue to avoid a high carb diet   Pituitary microadenoma: no nipple discharge or double vision, prolactin was normal back in August 2022, we will recheck next year   IMPRESSION: 1. Unchanged distribution of white matter lesions consistent with multiple sclerosis. No active demyelinating lesions. 2. 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  Keratoderma bilateral foot: seen by Dr. Cleda Mccreedy and was advised to have surgery but she is not interested at this time.  She has intermittent pain but tolerable   Patient Active Problem List   Diagnosis Date Noted   Essential (hemorrhagic) thrombocythemia ([place]) 11/30/2021   Rectocele 07/08/2015   Status post laparoscopic assisted vaginal hysterectomy (LAVH) 07/08/2015   Left lumbar radiculitis 03/28/2015   ADD (attention deficit disorder) 12/31/2014   Allergic to bees 12/31/2014   Insomnia, persistent 12/31/2014   Constipation 12/31/2014   Depression, major, recurrent, mild ([place]) 12/31/2014   Asthma, mild intermittent 12/31/2014   Migraine with aura and without status migrainosus, not intractable 12/31/2014   Multiple sclerosis, relapsing-remitting ([place]) 12/31/2014   Endometriosis determined by laparoscopy 12/31/2014   Perennial allergic rhinitis 12/31/2014   Pituitary microadenoma ([place]) 12/31/2014  Vitamin D deficiency 12/31/2014   Acquired trigger finger 12/31/2014    Past Surgical History:  Procedure Laterality Date   ABDOMINAL HYSTERECTOMY  05/26/2015   BREAST BIOPSY Right 2005   benign   INCISIONAL HERNIA REPAIR N/A 10/09/2019   Procedure: HERNIA REPAIR INCISIONAL;  Surgeon: Olean Ree, MD;  Location: [facility];  Service: General;  Laterality: N/A;   INSERTION OF MESH N/A 10/09/2019   Procedure: INSERTION OF MESH;  Surgeon: Olean Ree, MD;  Location: [facility];  Service: General;  Laterality: N/A;   LAPAROSCOPIC ASSISTED VAGINAL HYSTERECTOMY N/A 05/26/2015   Procedure: LAPAROSCOPIC ASSISTED VAGINAL HYSTERECTOMY, with right salpingectomy;  Surgeon: Brayton Mars, MD;  Location: [facility];  Service: Gynecology;  Laterality: N/A;   LAPAROTOMY N/A 12/14/2018   Procedure: EXPLORATORY LAPAROTOMY;  Surgeon: Olean Ree, MD;  Location: [facility];  Service: General;  Laterality: N/A;   OVARY SURGERY Left 09/30/2014   RECTOCELE REPAIR N/A 05/26/2015   Procedure: POSTERIOR REPAIR (RECTOCELE);  Surgeon: Brayton Mars, MD;  Location: [facility] OR[facility];  Service: Gynecology;  Laterality: N/A;    Family History  Problem Relation Age of Onset   Anemia Mother    Osteoporosis Mother    Mental illness Father        Bipolar   Arthritis Brother    Breast cancer Maternal Aunt     Social History   Tobacco Use   Smoking status: Some Days    Packs/day: 0.25    Types: Cigarettes   Smokeless tobacco: Never  Substance Use Topics   Alcohol use: Yes    Alcohol/week: 14.0 standard drinks of alcohol    Types: 14 Shots of liquor per week    Comment: occasionally, 1-2 shots every night     Current Outpatient Medications:    albuterol (VENTOLIN HFA) 108 (90 Base) MCG/ACT inhaler, Inhale 1 puff into the lungs every 6 (six) hours as needed for wheezing or shortness of breath. Reported on 09/26/2015, Disp: 24 g, Rfl: 0   amphetamine-dextroamphetamine (ADDERALL XR) 25 MG 24 hr capsule, Take 1 capsule by mouth See admin instructions., Disp: 30 capsule, Rfl: 0   amphetamine-dextroamphetamine (ADDERALL) 10 MG tablet, Take 1 tablet (10 mg total) by mouth daily. On weekends, Disp: 30 tablet, Rfl: 0   cetirizine (ZYRTEC) 10 MG tablet, Take 10 mg by mouth at bedtime. , Disp: , Rfl:    Cholecalciferol (VITAMIN D3) 50 MCG (2000 UT) TABS, Take 2,000 Units by mouth daily., Disp: , Rfl:    DULoxetine (CYMBALTA) 60 MG capsule, Take 1 capsule (60 mg  total) by mouth daily., Disp: 90 capsule, Rfl: 1   EPINEPHrine 0.3 mg/0.3 mL IJ SOAJ injection, Inject 0.3 mg into the muscle as needed for anaphylaxis. , Disp: , Rfl:    fluticasone (FLONASE) 50 MCG/ACT nasal spray, Place 2 sprays into both nostrils daily., Disp: 48 g, Rfl: 1   levocetirizine (XYZAL) 5 MG tablet, TAKE 1 TABLET(5 MG) BY MOUTH DAILY, Disp: 90 tablet, Rfl: 1   montelukast (SINGULAIR) 10 MG tablet, TAKE 1 TABLET(10 MG) BY MOUTH AT BEDTIME, Disp: 90 tablet, Rfl: 1   QUEtiapine (SEROQUEL) 25 MG tablet, TAKE 1 TABLET(25 MG) BY MOUTH AT BEDTIME, Disp: 90 tablet, Rfl: 1  Allergies  Allergen Reactions   Penicillins Anaphylaxis    Did it involve swelling of the face/tongue/throat, SOB, or low BP? Yes Did it involve sudden or severe rash/hives, skin peeling, or any reaction on the inside of your mouth or nose? No Did you need  to seek medical attention at a hospital or doctor's office? Yes When did it last happen? A long time ago If all above answers are "NO", may proceed with cephalosporin use.   Bee Venom     Bee    I personally reviewed active problem list, medication list, allergies, family history, social history, health maintenance with the patient/caregiver today.   ROS  Constitutional: Negative for fever or weight change.  Respiratory: Negative for cough and shortness of breath.   Cardiovascular: Negative for chest pain or palpitations.  Gastrointestinal: Negative for abdominal pain, no bowel changes.  Musculoskeletal: Negative for gait problem or joint swelling.  Skin: Negative for rash.  Neurological: Negative for dizziness or headache.  No other specific complaints in a complete review of systems (except as listed in HPI above).   Objective  Vitals:   03/05/22 0816  BP: 124/76  Pulse: 86  Resp: 16  SpO2: 97%  Weight: 197 lb (89.4 kg)  Height: '5\' 9"'$  (1.753 m)    Body mass index is 29.09 kg/m.  Physical Exam  Constitutional: Patient appears  well-developed and well-nourished.  No distress.  HEENT: head atraumatic, normocephalic, pupils equal and reactive to light, neck supple Cardiovascular: Normal rate, regular rhythm and normal heart sounds.  No murmur heard. No BLE edema. Pulmonary/Chest: Effort normal and breath sounds normal. No respiratory distress. Abdominal: Soft.  There is no tenderness. Psychiatric: Patient has a normal mood and affect. behavior is normal. Judgment and thought content normal.    PHQ2/9:    03/05/2022    8:15 AM 11/30/2021    8:25 AM 10/12/2021    8:25 AM 09/01/2021    9:17 AM 05/29/2021    9:11 AM  Depression screen PHQ 2/9  Decreased Interest 1 1 0 0 1  Down, Depressed, Hopeless 0 1 0 0 1  PHQ - 2 Score 1 2 0 0 2  Altered sleeping 1 3 0 0 0  Tired, decreased energy 3 3 0 0 1  Change in appetite 0 0 0 0 2  Feeling bad or failure about yourself  0 1 0 0 0  Trouble concentrating 0 0 0 0 1  Moving slowly or fidgety/restless 0 0 0 0 1  Suicidal thoughts 0 0 0 0 0  PHQ-9 Score 5 9 0 0 7  Difficult doing work/chores     Not difficult at all    phq 9 is positive   Fall Risk:    03/05/2022    8:15 AM 11/30/2021    8:25 AM 10/12/2021    8:25 AM 09/01/2021    9:17 AM 05/29/2021    9:10 AM  Fall Risk   Falls in the past year? 0 0 0 0 0  Number falls in past yr: 0 0 0 0 0  Injury with Fall? 0 0 0 0 0  Risk for fall due to : No Fall Risks No Fall Risks No Fall Risks No Fall Risks   Follow up Falls prevention discussed Falls prevention discussed Falls prevention discussed Falls prevention discussed       Functional Status Survey: Is the patient deaf or have difficulty hearing?: No Does the patient have difficulty seeing, even when wearing glasses/contacts?: No Does the patient have difficulty concentrating, remembering, or making decisions?: No Does the patient have difficulty walking or climbing stairs?: No Does the patient have difficulty dressing or bathing?: No Does the patient have difficulty  doing errands alone such as visiting a doctor's office or shopping?:  No    Assessment & Plan   1. Multiple sclerosis, relapsing-remitting (HCC)  Stable   2. Moderate major depression (HCC)  Doing well on medication   3. Pituitary microadenoma (HCC)  No symptoms   4. Attention deficit hyperactivity disorder (ADHD), combined type  - amphetamine-dextroamphetamine (ADDERALL XR) 20 MG 24 hr capsule; Take 1 capsule (20 mg total) by mouth every morning.  Dispense: 90 capsule; Refill: 0  5. B12 deficiency   6. Mild intermittent asthma without complication  Doing well prn   7. Pre-diabetes  Continue low carb diet   8. Dyslipidemia

## 2022-03-05 ENCOUNTER — Ambulatory Visit (INDEPENDENT_AMBULATORY_CARE_PROVIDER_SITE_OTHER): Payer: BC Managed Care – PPO | Admitting: Family Medicine

## 2022-03-05 ENCOUNTER — Encounter: Payer: Self-pay | Admitting: Family Medicine

## 2022-03-05 VITALS — BP 124/76 | HR 86 | Resp 16 | Ht 69.0 in | Wt 197.0 lb

## 2022-03-05 DIAGNOSIS — F321 Major depressive disorder, single episode, moderate: Secondary | ICD-10-CM

## 2022-03-05 DIAGNOSIS — E785 Hyperlipidemia, unspecified: Secondary | ICD-10-CM

## 2022-03-05 DIAGNOSIS — R7303 Prediabetes: Secondary | ICD-10-CM

## 2022-03-05 DIAGNOSIS — D352 Benign neoplasm of pituitary gland: Secondary | ICD-10-CM | POA: Diagnosis not present

## 2022-03-05 DIAGNOSIS — F902 Attention-deficit hyperactivity disorder, combined type: Secondary | ICD-10-CM | POA: Diagnosis not present

## 2022-03-05 DIAGNOSIS — J4520 Mild intermittent asthma, uncomplicated: Secondary | ICD-10-CM

## 2022-03-05 DIAGNOSIS — G35 Multiple sclerosis: Secondary | ICD-10-CM

## 2022-03-05 DIAGNOSIS — E538 Deficiency of other specified B group vitamins: Secondary | ICD-10-CM

## 2022-03-05 MED ORDER — AMPHETAMINE-DEXTROAMPHET ER 20 MG PO CP24: 20.0000 mg | ORAL_CAPSULE | ORAL | 0 refills | Status: DC

## 2022-03-25 ENCOUNTER — Ambulatory Visit: Payer: Self-pay

## 2022-03-25 NOTE — Patient Outreach (Signed)
  Care Coordination   Initial Visit Note   03/25/2022 Name: Robin Chan MRN: [id number] DOB: Dec 16, 1970  Robin FARAJ is a 51 y.o. year old female who sees Steele Sizer, MD for primary care. I spoke with  [name] by phone today.  What matters to the patients health and wellness today?  Effectively managing her MS and depression.    Goals Addressed             This Visit's Progress    RNCM: Effective Management of MS and Depression       Care Coordination Interventions: Evaluation of current treatment plan related to MS and depression  and patient's adherence to plan as established by provider Advised patient to call the office for changes in MS sx and sx, mood, anxiety, depression, or mental health status Provided education to patient re: working with the care coordination team, review of MD visit notes, and being a support to the patient in helping manage health and well being Reviewed medications with patient and discussed compliance and availability. The patient has been able to get her adderall without difficulty at this time.  Collaborated with LCSW regarding patients depression and dealing with her MS and changes Provided patient with mindfulness  educational materials related to helping with anxiety/depression the patient may be experiencing. The patient states she has a lot of social factors impacting her care Reviewed scheduled/upcoming provider appointments including 04-06-2022 at 0900 am with LCSW, and pcp 06-09-2022 at 10 am Social Work referral for ongoing support and education with coping skills for MS and depression  Discussed plans with patient for ongoing care management follow up and provided patient with direct contact information for care management team Advised patient to discuss changes in her chronic conditions or changes in her mental health and well being  with provider Screening for signs and symptoms of depression related to chronic disease  state  Assessed social determinant of health barriers 03-25-2022: The patient agrees to outreaches from the [facility] and LCSW. Has contact information to call if needs arise between outreaches.   Active listening / Reflection utilized  Emotional Support Provided         SDOH assessments and interventions completed:  Yes  SDOH Interventions Today    Flowsheet Row Most Recent Value  SDOH Interventions   Food Insecurity Interventions Intervention Not Indicated  Housing Interventions Intervention Not Indicated  Transportation Interventions Intervention Not Indicated        Care Coordination Interventions Activated:  Yes  Care Coordination Interventions:  Yes, provided   Follow up plan: Follow up call scheduled for 04-06-2022 at 0900 am with LCSW; 05-20-2022 with RNCM    Encounter Outcome:  Pt. Visit Completed   Noreene Larsson RN, MSN, [facility] Mobile: [phone]

## 2022-03-25 NOTE — Patient Instructions (Addendum)
Visit Information  Thank you for taking time to visit with me today. Please don't hesitate to contact me if I can be of assistance to you.   Following are the goals we discussed today:   Goals Addressed             This Visit's Progress    RNCM: Effective Management of MS and Depression       Care Coordination Interventions: Evaluation of current treatment plan related to MS and depression  and patient's adherence to plan as established by provider Advised patient to call the office for changes in MS sx and sx, mood, anxiety, depression, or mental health status Provided education to patient re: working with the care coordination team, review of MD visit notes, and being a support to the patient in helping manage health and well being Reviewed medications with patient and discussed compliance and availability. The patient has been able to get her adderall without difficulty at this time.  Collaborated with LCSW regarding patients depression and dealing with her MS and changes Provided patient with mindfulness  educational materials related to helping with anxiety/depression the patient may be experiencing. The patient states she has a lot of social factors impacting her care Reviewed scheduled/upcoming provider appointments including 04-06-2022 at 0900 am with LCSW, and pcp 06-09-2022 at 10 am Social Work referral for ongoing support and education with coping skills for MS and depression  Discussed plans with patient for ongoing care management follow up and provided patient with direct contact information for care management team Advised patient to discuss changes in her chronic conditions or changes in her mental health and well being  with provider Screening for signs and symptoms of depression related to chronic disease state  Assessed social determinant of health barriers 03-25-2022: The patient agrees to outreaches from the [facility] and LCSW. Has contact information to call if needs arise  between outreaches.   Active listening / Reflection utilized  Emotional Support Provided         Our next appointment is by telephone on 05-20-2022 at 0900 am  Please call the care guide team at [phone] if you need to cancel or reschedule your appointment.   If you are experiencing a Mental Health or [place] or need someone to talk to, please call the Suicide and Crisis Lifeline: 988 call the [place] National Suicide Prevention Lifeline: (513)867-8426 or TTY: 838-804-3529 TTY (240)737-3631) to talk to a trained counselor call 1-800-273-TALK (toll free, 24 hour hotline)  Patient verbalizes understanding of instructions and care plan provided today and agrees to view in [place]. Active MyChart status and patient understanding of how to access instructions and care plan via MyChart confirmed with patient.     Telephone follow up appointment with care management team member scheduled for: 05-20-2022 at 0900 am  Noreene Larsson RN, MSN, [facility] Mobile: [phone]    Mindfulness-Based Stress Reduction Mindfulness-based stress reduction (MBSR) is a program that helps people learn to practice mindfulness. Mindfulness is the practice of consciously paying attention to the present moment. MBSR focuses on developing self-awareness, which lets you respond to life stress without judgment or negative feelings. It can be learned and practiced through techniques such as education, breathing exercises, meditation, and yoga. MBSR includes several mindfulness techniques in one program. MBSR works best when you understand the treatment, are willing to try new things, and can commit to spending time practicing what you learn. MBSR training may  include learning about: How your feelings, thoughts, and reactions affect your body. New ways to respond to things that cause negative thoughts to start (triggers). How to notice your  thoughts and let go of them. Practicing awareness of everyday things that you normally do without thinking. The techniques and goals of different types of meditation. What are the benefits of MBSR? MBSR can have many benefits, which include helping you to: Develop self-awareness. This means knowing and understanding yourself. Learn skills and attitudes that help you to take part in your own health care. Learn new ways to care for yourself. Be more accepting about how things are, and let things go. Be less judgmental and approach things with an open mind. Be patient with yourself and trust yourself more. MBSR has also been shown to: Reduce negative emotions, such as sadness, overwhelm, and worry. Improve memory and focus. Change how you sense and react to pain. Boost your body's ability to fight infections. Help you connect better with other people. Improve your sense of well-being. How to practice mindfulness To do a basic awareness exercise: Find a comfortable place to sit. Pay attention to the present moment. Notice your thoughts, feelings, and surroundings just as they are. Avoid judging yourself, your feelings, or your surroundings. Make note of any judgment that comes up and let it go. Your mind may wander, and that is okay. Make note of when your thoughts drift, and return your attention to the present moment. To do basic mindfulness meditation: Find a comfortable place to sit. This may include a stable chair or a firm floor cushion. Sit upright with your back straight. Let your arms fall next to your sides, with your hands resting on your legs. If you are sitting in a chair, rest your feet flat on the floor. If you are sitting on a cushion, cross your legs in front of you. Keep your head in a neutral position with your chin dropped slightly. Relax your jaw and rest the tip of your tongue on the roof of your mouth. Drop your gaze to the floor or close your eyes. Breathe normally  and pay attention to your breath. Feel the air moving in and out of your nose. Feel your belly expanding and relaxing with each breath. Your mind may wander, and that is okay. Make note of when your thoughts drift, and return your attention to your breath. Avoid judging yourself, your feelings, or your surroundings. Make note of any judgment or feelings that come up, let them go, and bring your attention back to your breath. When you are ready, lift your gaze or open your eyes. Pay attention to how your body feels after the meditation. Follow these instructions at home:  Find a local in-person or online MBSR program. Set aside some time regularly for mindfulness practice. Practice every day if you can. Even 10 minutes of practice is helpful. Find a mindfulness practice that works best for you. This may include one or more of the following: Meditation. This involves focusing your mind on a certain thought or activity. Breathing awareness exercises. These help you to stay present by focusing on your breath. Body scan. For this practice, you lie down and pay attention to each part of your body from head to toe. You can identify tension and soreness and consciously relax parts of your body. Yoga. Yoga involves stretching and breathing, and it can improve your ability to move and be flexible. It can also help you to test your body's  limits, which can help you release stress. Mindful eating. This way of eating involves focusing on the taste, texture, color, and smell of each bite of food. This slows down eating and helps you feel full sooner. For this reason, it can be an important part of a weight loss plan. Find a podcast or recording that provides guidance for breathing awareness, body scan, or meditation exercises. You can listen to these any time when you have a free moment to rest without distractions. Follow your treatment plan as told by your health care provider. This may include taking regular  medicines and making changes to your diet or lifestyle as recommended. Where to find more information You can find more information about MBSR from: Your health care provider. Community-based meditation centers or programs. Programs offered near you. Summary Mindfulness-based stress reduction (MBSR) is a program that teaches you how to consciously pay attention to the present moment. It is used to help you deal better with daily stress, feelings, and pain. MBSR focuses on developing self-awareness, which allows you to respond to life stress without judgment or negative feelings. MBSR programs may involve learning different mindfulness practices, such as breathing exercises, meditation, yoga, body scan, or mindful eating. Find a mindfulness practice that works best for you, and set aside time for it on a regular basis. This information is not intended to replace advice given to you by your health care provider. Make sure you discuss any questions you have with your health care provider. Document Revised: 02/19/2021 Document Reviewed: 02/19/2021 Elsevier Patient Education  [place].    Multiple Sclerosis Multiple sclerosis (MS) is a disease of the brain, spinal cord, and optic nerves (central nervous system). It causes the body's disease-fighting system (immunesystem) to destroy the protective covering around nerves in the brain (myelin sheath). When this happens, signals (nerve impulses) going to and from the brain and spinal cord do not get sent properly or may not get sent at all. There are several types of MS: Relapsing-remitting MS. This is the most common type. This causes sudden attacks of symptoms. After an attack, you may recover completely until the next attack, or some symptoms may remain permanently. Secondary progressive MS. This usually develops after the onset of relapsing-remitting MS. Similar to relapsing-remitting MS, this type also causes sudden attacks of symptoms.  Attacks may be less frequent, but symptoms slowly get worse over time. Primary progressive MS. This causes symptoms that steadily progress over time. This type of MS does not cause sudden attacks of symptoms. The age of onset of MS varies, but it often develops between 35 and 72 years of age. MS is a lifelong (chronic) condition. There is no cure, but treatment can help slow down the progression of the disease. What are the causes? The cause of this condition is not known. What increases the risk? You are more likely to develop this condition if: You are a woman. You have a relative with MS. However, the condition is not passed from parent to child (inherited). You have a lack (deficiency) of vitamin D. You smoke. MS is more common in the [place] than in the [place]. What are the signs or symptoms? Relapsing-remitting and secondary progressive MS cause symptoms to occur in episodes or attacks that may last weeks to months. There may be long periods between attacks in which there are almost no symptoms. Primary progressive MS causes symptoms to steadily progress after they develop. Symptoms of MS vary because of  the many different ways it affects the central nervous system. The main symptoms include: Vision problems and eye pain. Numbness and weakness. Inability to move your arms, hands, feet, or legs (paralysis). Balance problems. Shaking that you cannot control (tremors). Sudden muscle tightening (spasms). Problems with thinking (cognitive changes). MS can also cause symptoms that are associated with the disease but are not always the direct result of an MS attack. They may include: Inability to control when you urinate or have bowel movements (incontinence). Headaches. Fatigue. Inability to tolerate heat. Emotional changes. Depression. Pain. How is this diagnosed? This condition is diagnosed based on: Your symptoms. A neurological exam. This involves  checking your central nervous system function, such as nerve function, reflexes, and coordination. MRIs of the brain and spinal cord. Lab tests, including a lumbar puncture that tests the fluid that surrounds the brain and spinal cord (cerebrospinal fluid). Tests to measure the electrical activity of the brain in response to stimulation (evoked potentials). How is this treated? There is no cure for MS, but medicines can help decrease the number and frequency of attacks and help relieve nuisance symptoms. Treatment options may include: Medicines that: Reduce the frequency of attacks. These medicines may be given by injection, by mouth (orally), or through an IV. Reduce inflammation (steroids). These may provide short-term relief of symptoms. Help control pain, depression, fatigue, or incontinence. Nutritional counseling. Eating a healthy, balanced diet can help with symptoms. Taking vitamin D supplements, if you have a deficiency. Using devices to help you move around (assistive devices), such as braces, a cane, or a walker. Therapy, such as: Physical therapy to strengthen and stretch your muscles. Occupational therapy to help you with everyday tasks. Alternative or complementary treatments such as massage or acupuncture. Low-impact, mild exercises, such as swimming, walking, and yoga. Regular exercise can help alleviate symptoms and increase strength and balance. Follow these instructions at home: Medicines Take over-the-counter and prescription medicines only as told by your health care provider. Ask your health care provider if the medicine prescribed to you requires you to avoid driving or using machinery. Activity Use assistive devices as recommended by your physical therapist or your health care provider. Exercise as directed by your health care provider. Return to your normal activities as told by your health care provider. Ask your health care provider what activities are safe for  you. General instructions Eating healthy can help manage MS symptoms. Reach out for support. Share your feelings with friends, family, or a support group. Keep all follow-up visits. This is important. Where to find more information National Multiple Sclerosis Society: www.nationalmssociety.[place] of Neurological Disorders and Stroke: MasterBoxes.it National Center for Complementary and Integrative Health: https://aguirre-king.net/ Contact a health care provider if: You feel depressed. You develop new pain or numbness. You have tremors. You have problems with sexual function. Get help right away if: You develop paralysis. You develop numbness. You have problems with your bladder or bowel function. You develop double vision. You lose vision in one or both eyes. You develop suicidal thoughts. You develop severe confusion. Get help right away if you feel like you may hurt yourself or others, or have thoughts about taking your own life. Go to your nearest emergency room or: Call 911. Call the [place] at (205)882-1258 or 988. This is open 24 hours a day. Text the Crisis Text Line at 276-603-1606. Summary Multiple sclerosis (MS) is a disease of the central nervous system that causes the body's immune system to destroy  the protective covering around nerves in the brain (myelin sheath). There are 3 types of MS: relapsing-remitting, secondary progressive, and primary progressive. There is no cure for MS, but medicines can help decrease the number and frequency of attacks and help relieve nuisance symptoms. Treatment may also include physical or occupational therapy. If you develop numbness, paralysis, vision problems, or other neurological symptoms, get help right away. This information is not intended to replace advice given to you by your health care provider. Make sure you discuss any questions you have with your health care provider. Document Revised:  03/18/2021 Document Reviewed: 03/18/2021 Elsevier Patient Education  [place].

## 2022-03-25 NOTE — Patient Outreach (Signed)
  Care Coordination   03/25/2022 Name: Robin Chan MRN: [id number] DOB: Oct 03, 1970   Care Coordination Outreach Attempts:  An unsuccessful telephone outreach was attempted today to offer the patient information about available care coordination services as a benefit of their health plan.   Follow Up Plan:  Additional outreach attempts will be made to offer the patient care coordination information and services.   Encounter Outcome:  No Answer  Care Coordination Interventions Activated:  No   Care Coordination Interventions:  No, not indicated    Noreene Larsson RN, MSN, CCM Community Care Coordinator [facility] Mobile: [phone]

## 2022-04-06 ENCOUNTER — Ambulatory Visit: Payer: Self-pay | Admitting: *Deleted

## 2022-04-06 NOTE — Patient Outreach (Signed)
  Care Coordination   Initial Visit Note   04/06/2022 Name: Robin Chan MRN: [id number] DOB: Jul 14, 1971  Robin Chan is a 51 y.o. year old female who sees Steele Sizer, MD for primary care. I spoke with  [name] by phone today.  What matters to the patients health and wellness today?  Stress Management    Goals Addressed             This Visit's Progress    stress management       Care Coordination Interventions: Patient discussed feeling overwhelmed related to work and relationship stress as while trying to manage her medical condition Solution-Focused Strategies employed:  Mindfulness or Relaxation training provided Active listening / Reflection utilized  Emotional Support Provided Discussed self-care action plan: focusing on thing she can control, prioritizing tasks, resting when needed         SDOH assessments and interventions completed:  Yes  SDOH Interventions Today    Flowsheet Row Most Recent Value  SDOH Interventions   Food Insecurity Interventions Intervention Not Indicated  Transportation Interventions Intervention Not Indicated  Stress Interventions Provide Counseling        Care Coordination Interventions Activated:  Yes  Care Coordination Interventions:  Yes, provided   Follow up plan: Follow up call scheduled for 04/20/22    Encounter Outcome:  Pt. Visit Completed

## 2022-04-06 NOTE — Patient Instructions (Signed)
Visit Information  Thank you for taking time to visit with me today. Please don't hesitate to contact me if I can be of assistance to you.   Following are the goals we discussed today:   Goals Addressed             This Visit's Progress    stress management       Care Coordination Interventions: Patient discussed feeling overwhelmed related to work and relationship stress as while trying to manage her medical condition Solution-Focused Strategies employed:  Mindfulness or Relaxation training provided Active listening / Reflection utilized  Emotional Support Provided Discussed self-care action plan: focusing on thing she can control, prioritizing tasks, resting when needed         Our next appointment is by telephone on 04/21/22 at 9am  Please call the care guide team at [phone] if you need to cancel or reschedule your appointment.   If you are experiencing a Mental Health or [place] or need someone to talk to, please call the Suicide and Crisis Lifeline: 988   Patient verbalizes understanding of instructions and care plan provided today and agrees to view in [place]. Active MyChart status and patient understanding of how to access instructions and care plan via MyChart confirmed with patient.     Telephone follow up appointment with care management team member scheduled for: 04/21/22  Elliot Gurney, [place] Worker  [facility] Care Management [phone]

## 2022-04-21 ENCOUNTER — Ambulatory Visit: Payer: Self-pay | Admitting: *Deleted

## 2022-04-21 NOTE — Patient Outreach (Signed)
  Care Coordination   Follow Up Visit Note   04/21/2022 Name: IARA MONDS MRN: [id number] DOB: 1971/06/01  TEMESHA QUEENER is a 51 y.o. year old female who sees Steele Sizer, MD for primary care. I spoke with  [name] by phone today.  What matters to the patients health and wellness today?  Mental heath support and resources    Goals Addressed             This Visit's Progress    stress management       Care Coordination Interventions: Patient discussed grief reaction following the loss of her pet Grief reaction normalized, verbalization of feelings encouraged Ongoing counseling recommended-patient confirms being seen at [facility] in the past and is agreeable to resuming care there Solution-Focused Strategies employed:  Active listening / Reflection utilized  Emotional Support Provided Discussed self-care action plan: leaning in to her  grief, focusing on things/people that bring her peace,  prioritizing tasks and resting when needed         SDOH assessments and interventions completed:  No     Care Coordination Interventions Activated:  Yes  Care Coordination Interventions:  Yes, provided   Follow up plan: Follow up call scheduled for 05/10/22    Encounter Outcome:  Pt. Visit Completed

## 2022-04-21 NOTE — Patient Instructions (Signed)
Visit Information  Thank you for taking time to visit with me today. Please don't hesitate to contact me if I can be of assistance to you.   Following are the goals we discussed today:   Goals Addressed             This Visit's Progress    stress management       Care Coordination Interventions: Patient discussed grief reaction following the loss of her pet Grief reaction normalized, verbalization of feelings encouraged Ongoing counseling recommended-patient confirms being seen at [facility] in the past and is agreeable to resuming care there Solution-Focused Strategies employed:  Active listening / Reflection utilized  Emotional Support Provided Discussed self-care action plan: leaning in to her  grief, focusing on things/people that bring her peace,  prioritizing tasks and resting when needed        Our next appointment is by telephone on 05/10/22 at 9am  Please call the care guide team at [phone] if you need to cancel or reschedule your appointment.   If you are experiencing a Mental Health or [place] or need someone to talk to, please call the Suicide and Crisis Lifeline: 988 call 911   Patient verbalizes understanding of instructions and care plan provided today and agrees to view in [place]. Active MyChart status and patient understanding of how to access instructions and care plan via MyChart confirmed with patient.     Telephone follow up appointment with care management team member scheduled for:05/10/22  Elliot Gurney, [place] Worker  [facility] Care Management [phone]

## 2022-05-10 ENCOUNTER — Ambulatory Visit: Payer: Self-pay | Admitting: *Deleted

## 2022-05-10 NOTE — Patient Outreach (Signed)
  Care Coordination   Follow Up Visit Note   05/10/2022 Name: Robin Chan MRN: [id number] DOB: 1971/02/03  Robin Chan is a 51 y.o. year old female who sees Steele Sizer, MD for primary care. I spoke with  [name] by phone today.  What matters to the patients health and wellness today?  Stress Management    Goals Addressed             This Visit's Progress    stress management       Care Coordination Interventions: Patient discussed feeling much better, starting to work through her grief related to the loss of her pet Verbalization of feelings encouraged, positive coping strategies discussed Ongoing counseling recommended-patient confirms being seen at [facility] in the past and is agreeable to resuming care there-additional resources provided including [place] as well as [employer] [phone] Solution-Focused Strategies employed:  Active listening / Reflection utilized  Emotional Support Provided Discussed self-care action plan:  focusing on things/people that bring her peace,  prioritizing tasks and resting when needed, following up with mental health reosurces provided as well as activities at the [facility]         SDOH assessments and interventions completed:  No     Care Coordination Interventions Activated:  No  Care Coordination Interventions:  Yes, provided   Follow up plan: Follow up call scheduled for 05/31/22    Encounter Outcome:  Pt. Visit Completed

## 2022-05-10 NOTE — Patient Instructions (Signed)
Visit Information  Thank you for taking time to visit with me today. Please don't hesitate to contact me if I can be of assistance to you.   Following are the goals we discussed today:   Goals Addressed             This Visit's Progress    stress management       Care Coordination Interventions: Patient discussed feeling much better, starting to work through her grief related to the loss of her pet Verbalization of feelings encouraged, positive coping strategies discussed Ongoing counseling recommended-patient confirms being seen at [place] [facility] in the past and is agreeable to resuming care there-additional resources provided including [place] as well as [employer] [employer] [phone] Solution-Focused Strategies employed:  Active listening / Reflection utilized  Emotional Support Provided Discussed self-care action plan:  focusing on things/people that bring her peace,  prioritizing tasks and resting when needed, following up with mental health reosurces provided as well as activities at the [place] next appointment is by telephone on 05/31/22 at 9am  Please call the care guide team at [phone] if you need to cancel or reschedule your appointment.   If you are experiencing a Mental Health or [place] or need someone to talk to, please call the Suicide and Crisis Lifeline: 988   Patient verbalizes understanding of instructions and care plan provided today and agrees to view in [place]. Active MyChart status and patient understanding of how to access instructions and care plan via MyChart confirmed with patient.     Telephone follow up appointment with care management team member scheduled for: 05/31/22  Elliot Gurney, [place] Worker  [facility] Care Management [phone]

## 2022-05-20 ENCOUNTER — Ambulatory Visit: Payer: Self-pay

## 2022-05-20 NOTE — Patient Instructions (Signed)
Visit Information  Thank you for taking time to visit with me today. Please don't hesitate to contact me if I can be of assistance to you.   Following are the goals we discussed today:   Goals Addressed             This Visit's Progress    RNCM: Effective Management of MS and Depression       Care Coordination Interventions: Evaluation of current treatment plan related to MS and depression  and patient's adherence to plan as established by provider. The patient expressed she is a little stressed out with things with her boyfriend and she is from [place] and she was planning on visiting with her 80 year old mom who still lives there and her care broke down and she had to dish out 1200.00 to fix that. She is feeling a little overwhelmed. She says she will be okay. Agrees to continue to work with the [facility] and [place] patient to call the office for changes in MS sx and sx, mood, anxiety, depression, or mental health status Provided education to patient re: working with the care coordination team, review of MD visit notes, and being a support to the patient in helping manage health and well being Reviewed medications with patient and discussed compliance and availability. The patient has been able to get her adderall without difficulty at this time.  Collaborated with LCSW regarding patients depression and dealing with her MS and changes. Ongoing and continued support from the LCSW with resources needed.  Provided patient with mindfulness  educational materials related to helping with anxiety/depression the patient may be experiencing. The patient states she has a lot of social factors impacting her care Reviewed scheduled/upcoming provider appointments including: pcp 06-09-2022 at 10 am Social Work referral for ongoing support and education with coping skills for MS and depression  Discussed plans with patient for ongoing care management follow up and provided patient with direct contact  information for care management team Advised patient to discuss changes in her chronic conditions or changes in her mental health and well being  with provider Screening for signs and symptoms of depression related to chronic disease state  Assessed social determinant of health barriers 03-25-2022: The patient agrees to outreaches from the [facility] and LCSW. Has contact information to call if needs arise between outreaches.   Active listening / Reflection utilized  Emotional Support Provided         Our next appointment is by telephone on 07-01-2022 at 0900 am  Please call the care guide team at [phone] if you need to cancel or reschedule your appointment.   If you are experiencing a Mental Health or [place] or need someone to talk to, please call the Suicide and Crisis Lifeline: 988 call the [place] National Suicide Prevention Lifeline: 270-472-8925 or TTY: (717) 354-5504 TTY 858-048-5755) to talk to a trained counselor call 1-800-273-TALK (toll free, 24 hour hotline)  Patient verbalizes understanding of instructions and care plan provided today and agrees to view in [place]. Active MyChart status and patient understanding of how to access instructions and care plan via MyChart confirmed with patient.     Telephone follow up appointment with care management team member scheduled for: 07-01-2022 at 0900 am  Danyiel the person I was telling you about that is from [place] is Marnee Guarneri, NP. She can be found on Facebook. She is a wonderful person.   Noreene Larsson RN, MSN, [place]  Mobile: [phone]

## 2022-05-20 NOTE — Patient Outreach (Signed)
  Care Coordination   Follow Up Visit Note   05/20/2022 Name: Robin Chan MRN: [id number] DOB: [birth year]  Robin Chan is a 51 y.o. year old female who sees Steele Sizer, MD for primary care. I spoke with  [name] by phone today.  What matters to the patients health and wellness today?  Stress management and feeling overwhelmed    Goals Addressed             This Visit's Progress    RNCM: Effective Management of MS and Depression       Care Coordination Interventions: Evaluation of current treatment plan related to MS and depression  and patient's adherence to plan as established by provider. The patient expressed she is a little stressed out with things with her boyfriend and she is from [place] and she was planning on visiting with her 56 year old mom who still lives there and her care broke down and she had to dish out 1200.00 to fix that. She is feeling a little overwhelmed. She says she will be okay. Agrees to continue to work with the [facility] and [place] patient to call the office for changes in MS sx and sx, mood, anxiety, depression, or mental health status Provided education to patient re: working with the care coordination team, review of MD visit notes, and being a support to the patient in helping manage health and well being Reviewed medications with patient and discussed compliance and availability. The patient has been able to get her adderall without difficulty at this time.  Collaborated with LCSW regarding patients depression and dealing with her MS and changes. Ongoing and continued support from the LCSW with resources needed.  Provided patient with mindfulness  educational materials related to helping with anxiety/depression the patient may be experiencing. The patient states she has a lot of social factors impacting her care Reviewed scheduled/upcoming provider appointments including: pcp 06-09-2022 at 10 am Social Work referral for ongoing support  and education with coping skills for MS and depression  Discussed plans with patient for ongoing care management follow up and provided patient with direct contact information for care management team Advised patient to discuss changes in her chronic conditions or changes in her mental health and well being  with provider Screening for signs and symptoms of depression related to chronic disease state  Assessed social determinant of health barriers 03-25-2022: The patient agrees to outreaches from the [facility] and LCSW. Has contact information to call if needs arise between outreaches.   Active listening / Reflection utilized  Emotional Support Provided         SDOH assessments and interventions completed:  No     Care Coordination Interventions Activated:  Yes  Care Coordination Interventions:  Yes, provided   Follow up plan: Follow up call scheduled for 07-01-2022 at 0900 am    Encounter Outcome:  Pt. Visit Completed

## 2022-05-31 ENCOUNTER — Ambulatory Visit: Payer: Self-pay | Admitting: *Deleted

## 2022-05-31 NOTE — Patient Instructions (Signed)
Visit Information  Thank you for taking time to visit with me today. Please don't hesitate to contact me if I can be of assistance to you.   Following are the goals we discussed today:   Goals Addressed             This Visit's Progress    stress management       Care Coordination Interventions: Patient discussed stress related to finances, family dynamics and her medical condition Consumer Credit Counseling recommended as well as follow up with Mental Health Counseling Verbalization of feelings encouraged, positive coping strategies discussed Ongoing counseling continues to be recommended-patient confirms being seen at [place] [facility] in the past and is agreeable to resuming care there-additional resources provided including [place] as well as [facility] [phone] Solution-Focused Strategies employed:  Active listening / Reflection utilized  Emotional Support Provided Discussed self-care action plan:  focusing on prioritizing tasks and resting when needed, following up with mental health reosurces provided as well as activities at the [place] next appointment is by telephone on 06/21/22 at 9am  Please call the care guide team at [phone] if you need to cancel or reschedule your appointment.   If you are experiencing a Mental Health or [place] or need someone to talk to, please call the Suicide and Crisis Lifeline: 988 call 911   Patient verbalizes understanding of instructions and care plan provided today and agrees to view in [place]. Active MyChart status and patient understanding of how to access instructions and care plan via MyChart confirmed with patient.     Telephone follow up appointment with care management team member scheduled for: 06/21/22  Elliot Gurney, [place] Worker  [facility] Care Management [phone]

## 2022-05-31 NOTE — Patient Outreach (Signed)
  Care Coordination   Follow Up Visit Note   05/31/2022 Name: Robin Chan MRN: [id number] DOB: [birth year]  Robin Chan is a 51 y.o. year old female who sees Steele Sizer, MD for primary care. I spoke with  [name] by phone today.  What matters to the patients health and wellness today?  Mental Health Counseling and Resources    Goals Addressed             This Visit's Progress    stress management       Care Coordination Interventions: Patient discussed stress related to finances, family dynamics and her medical condition Consumer Credit Counseling recommended as well as follow up with Mental Health Counseling Verbalization of feelings encouraged, positive coping strategies discussed Ongoing counseling continues to be recommended-patient confirms being seen at [place] [facility] in the past and is agreeable to resuming care there-additional resources provided including [place] as well as [employer] [phone] Solution-Focused Strategies employed:  Active listening / Reflection utilized  Emotional Support Provided Discussed self-care action plan:  focusing on prioritizing tasks and resting when needed, following up with mental health reosurces provided as well as activities at the [facility]         SDOH assessments and interventions completed:  No     Care Coordination Interventions Activated:  Yes  Care Coordination Interventions:  Yes, provided   Follow up plan: Follow up call scheduled for 06/21/22    Encounter Outcome:  Pt. Visit Completed

## 2022-06-08 NOTE — Progress Notes (Unsigned)
Name: Robin Chan   MRN: [id number]    DOB: [birth year]   Date:06/09/2022       Progress Note  Subjective  Chief Complaint  Follow Up  HPI  ADD: taking medication daily as prescribed, on Adderal XR 20 mg now and she states she has been feeling well with medication  , it helps her stay focused at work. She has only been taking Adderal immediate release on weekends when she sleeps in. She states sometimes XR does not last all day and prefers Vyvanse. She took it for many years and it helped all day and she did not have end of the day crash. Since Vyvanse is generic now we will try PA an dif not covered we will go back to Adderal XR and prn Adderal immediate release   MS: diagnosed in 2000, but first symptoms in 1991 - she had numbness  on the right side and weakness.  Currently off medication because of multiple side effects. Has intermittent weakness and has used a cane periodically, for left foot drop, she has noticed some shooting pain like a zap on left upper extremity - happens at night - intermittently .  Adderall helps with energy level. She is currently working full time [employer]. Last MRI done 12/2019 showed that plaques were stable. Discussed sending her back to neurologist or repeating MRI but she cannot afford it at this time. She asked me about Elavil to control symptoms since she took it in the past   Insomnia: she has been off Ambien, we stopped it on the Spring 2018.  She is taking Seroquel low dose qhs and helps her fall asleep ( takes about 20 minutes . Continue medication.   Major Depression: she was  taking Lexapro for many years and depression was not controlled, we switched to Cymbalta in June 2017, She is separated and divorce finalized Fall 2020. She stopped seeing psychiatrist at [facility] She is currently doing well on Duloxetine 60 mg , off the 30 mg dose due to insurance , still taking Seroquel 25 mg at night. Phq 9 is still positive. She is trying to  sell her house .    Asthma: she is taking singulair daily now , she denies wheezing, cough or SOB at this time .  She has not used albuterol lately Unchanged    AR: she is using Flonase, singulair, zyrtec and xyzal daily. She sates always congested   Pre-diabetes: denies polyphagia, polydipsia or polyuria, Last A1C was down to 5.5 % , on life style modifications and doing well . We will recheck every 3 years or if needed   Pituitary microadenoma: no nipple discharge or double vision, prolactin was normal back in August 2022, discussed rechecking labs today    IMPRESSION: 1. Unchanged distribution of white matter lesions consistent with multiple sclerosis. No active demyelinating lesions. 2. 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    Patient Active Problem List   Diagnosis Date Noted   Essential (hemorrhagic) thrombocythemia ([place]) 11/30/2021   Rectocele 07/08/2015   Status post laparoscopic assisted vaginal hysterectomy (LAVH) 07/08/2015   Left lumbar radiculitis 03/28/2015   ADD (attention deficit disorder) 12/31/2014   Allergic to bees 12/31/2014   Insomnia, persistent 12/31/2014   Constipation 12/31/2014   Depression, major, recurrent, mild ([place]) 12/31/2014   Asthma, mild intermittent 12/31/2014   Migraine with aura and without status migrainosus, not intractable 12/31/2014  Multiple sclerosis, relapsing-remitting ([place]) 12/31/2014   Endometriosis determined by laparoscopy 12/31/2014   Perennial allergic rhinitis 12/31/2014   Pituitary microadenoma ([place]) 12/31/2014   Vitamin D deficiency 12/31/2014   Acquired trigger finger 12/31/2014    Past Surgical History:  Procedure Laterality Date   ABDOMINAL HYSTERECTOMY  05/26/2015   BREAST BIOPSY Right 2005   benign   INCISIONAL HERNIA REPAIR N/A 10/09/2019   Procedure: HERNIA REPAIR INCISIONAL;  Surgeon: Olean Ree, MD;  Location: [facility] ORS;  Service:  General;  Laterality: N/A;   INSERTION OF MESH N/A 10/09/2019   Procedure: INSERTION OF MESH;  Surgeon: Olean Ree, MD;  Location: [facility] ORS;  Service: General;  Laterality: N/A;   LAPAROSCOPIC ASSISTED VAGINAL HYSTERECTOMY N/A 05/26/2015   Procedure: LAPAROSCOPIC ASSISTED VAGINAL HYSTERECTOMY, with right salpingectomy;  Surgeon: Brayton Mars, MD;  Location: [facility] ORS;  Service: Gynecology;  Laterality: N/A;   LAPAROTOMY N/A 12/14/2018   Procedure: EXPLORATORY LAPAROTOMY;  Surgeon: Olean Ree, MD;  Location: [facility] ORS;  Service: General;  Laterality: N/A;   OVARY SURGERY Left 09/30/2014   RECTOCELE REPAIR N/A 05/26/2015   Procedure: POSTERIOR REPAIR (RECTOCELE);  Surgeon: Brayton Mars, MD;  Location: [facility] ORS;  Service: Gynecology;  Laterality: N/A;    Family History  Problem Relation Age of Onset   Anemia Mother    Osteoporosis Mother    Mental illness Father        Bipolar   Arthritis Brother    Breast cancer Maternal Aunt     Social History   Tobacco Use   Smoking status: Some Days    Packs/day: 0.25    Types: Cigarettes   Smokeless tobacco: Never  Substance Use Topics   Alcohol use: Yes    Alcohol/week: 14.0 standard drinks of alcohol    Types: 14 Shots of liquor per week    Comment: occasionally, 1-2 shots every night     Current Outpatient Medications:    albuterol (VENTOLIN HFA) 108 (90 Base) MCG/ACT inhaler, Inhale 1 puff into the lungs every 6 (six) hours as needed for wheezing or shortness of breath. Reported on 09/26/2015, Disp: 24 g, Rfl: 0   amphetamine-dextroamphetamine (ADDERALL XR) 20 MG 24 hr capsule, Take 1 capsule (20 mg total) by mouth every morning., Disp: 90 capsule, Rfl: 0   amphetamine-dextroamphetamine (ADDERALL) 10 MG tablet, Take 1 tablet (10 mg total) by mouth daily. On weekends, Disp: 30 tablet, Rfl: 0   cetirizine (ZYRTEC) 10 MG tablet, Take 10 mg by mouth at bedtime. , Disp: , Rfl:    Cholecalciferol (VITAMIN D3) 50 MCG (2000 UT)  TABS, Take 2,000 Units by mouth daily., Disp: , Rfl:    DULoxetine (CYMBALTA) 60 MG capsule, Take 1 capsule (60 mg total) by mouth daily., Disp: 90 capsule, Rfl: 1   EPINEPHrine 0.3 mg/0.3 mL IJ SOAJ injection, Inject 0.3 mg into the muscle as needed for anaphylaxis. , Disp: , Rfl:    fluticasone (FLONASE) 50 MCG/ACT nasal spray, Place 2 sprays into both nostrils daily., Disp: 48 g, Rfl: 1   levocetirizine (XYZAL) 5 MG tablet, TAKE 1 TABLET(5 MG) BY MOUTH DAILY, Disp: 90 tablet, Rfl: 1   montelukast (SINGULAIR) 10 MG tablet, TAKE 1 TABLET(10 MG) BY MOUTH AT BEDTIME, Disp: 90 tablet, Rfl: 1   QUEtiapine (SEROQUEL) 25 MG tablet, TAKE 1 TABLET(25 MG) BY MOUTH AT BEDTIME, Disp: 90 tablet, Rfl: 1  Allergies  Allergen Reactions   Penicillins Anaphylaxis    Did it involve swelling of the face/tongue/throat, SOB,  or low BP? Yes Did it involve sudden or severe rash/hives, skin peeling, or any reaction on the inside of your mouth or nose? No Did you need to seek medical attention at a hospital or doctor's office? Yes When did it last happen? A long time ago If all above answers are "NO", may proceed with cephalosporin use.   Bee Venom     Bee    I personally reviewed active problem list, medication list, allergies, family history, social history, health maintenance with the patient/caregiver today.   ROS  Constitutional: Negative for fever or weight change.  Respiratory: Negative for cough and shortness of breath.   Cardiovascular: Negative for chest pain or palpitations.  Gastrointestinal: Negative for abdominal pain, no bowel changes.  Musculoskeletal: Negative for gait problem or joint swelling.  Skin: Negative for rash.  Neurological: Negative for dizziness or headache.  No other specific complaints in a complete review of systems (except as listed in HPI above).   Objective  Vitals:   06/09/22 0947  BP: 124/72  Pulse: 98  Resp: 14  Temp: 98.6 F (37 C)  TempSrc: Oral  SpO2: 98%   Weight: 192 lb 14.4 oz (87.5 kg)  Height: 5' 9.5" (1.765 m)    Body mass index is 28.08 kg/m.  Physical Exam  Constitutional: Patient appears well-developed and well-nourished. No distress.  HEENT: head atraumatic, normocephalic, pupils equal and reactive to light, neck supple, throat within normal limits Cardiovascular: Normal rate, regular rhythm and normal heart sounds.  No murmur heard. No BLE edema. Pulmonary/Chest: Effort normal and breath sounds normal. No respiratory distress. Abdominal: Soft.  There is no tenderness. Neuro: normal sensation, strength , gait is normal today  Psychiatric: Patient has a normal mood and affect. behavior is normal. Judgment and thought content normal.   PHQ2/9:    06/09/2022    9:51 AM 03/05/2022    8:15 AM 11/30/2021    8:25 AM 10/12/2021    8:25 AM 09/01/2021    9:17 AM  Depression screen PHQ 2/9  Decreased Interest '1 1 1 '$ 0 0  Down, Depressed, Hopeless 0 0 1 0 0  PHQ - 2 Score '1 1 2 '$ 0 0  Altered sleeping '1 1 3 '$ 0 0  Tired, decreased energy '1 3 3 '$ 0 0  Change in appetite 0 0 0 0 0  Feeling bad or failure about yourself  0 0 1 0 0  Trouble concentrating 0 0 0 0 0  Moving slowly or fidgety/restless 0 0 0 0 0  Suicidal thoughts 0 0 0 0 0  PHQ-9 Score '3 5 9 '$ 0 0  Difficult doing work/chores Not difficult at all        phq 9 is positive   Fall Risk:    06/09/2022    9:49 AM 03/05/2022    8:15 AM 11/30/2021    8:25 AM 10/12/2021    8:25 AM 09/01/2021    9:17 AM  Fall Risk   Falls in the past year? 0 0 0 0 0  Number falls in past yr:  0 0 0 0  Injury with Fall?  0 0 0 0  Risk for fall due to : No Fall Risks No Fall Risks No Fall Risks No Fall Risks No Fall Risks  Follow up Education provided;Falls prevention discussed;Falls evaluation completed Falls prevention discussed Falls prevention discussed Falls prevention discussed Falls prevention discussed      Functional Status Survey: Is the patient deaf or have difficulty hearing?: No  Does  the patient have difficulty seeing, even when wearing glasses/contacts?: No Does the patient have difficulty concentrating, remembering, or making decisions?: Yes Does the patient have difficulty walking or climbing stairs?: Yes Does the patient have difficulty dressing or bathing?: No Does the patient have difficulty doing errands alone such as visiting a doctor's office or shopping?: No    Assessment & Plan  1. Attention deficit hyperactivity disorder (ADHD), combined type  - lisdexamfetamine (VYVANSE) 40 MG capsule; Take 1 capsule (40 mg total) by mouth every morning.  Dispense: 30 capsule; Refill: 0  2. Multiple sclerosis, relapsing-remitting (HCC)  - amitriptyline (ELAVIL) 25 MG tablet; Take 1 tablet (25 mg total) by mouth at bedtime.  Dispense: 90 tablet; Refill: 0  3. Pituitary microadenoma (HCC)  - Prolactin  4. Depression, major, recurrent, mild ([place])  Doing better today   5. B12 deficiency   6. Dyslipidemia  On life style modification  7. Mild intermittent asthma without complication  Continue prn medication  8. Pre-diabetes  Continue life style modification   9. Perennial allergic rhinitis with seasonal variation   10. Other insomnia  Continue seroquel   11. Long-term use of high-risk medication  - COMPLETE METABOLIC PANEL WITH GFR

## 2022-06-09 ENCOUNTER — Ambulatory Visit (INDEPENDENT_AMBULATORY_CARE_PROVIDER_SITE_OTHER): Payer: BC Managed Care – PPO | Admitting: Family Medicine

## 2022-06-09 ENCOUNTER — Encounter: Payer: Self-pay | Admitting: Family Medicine

## 2022-06-09 VITALS — BP 124/72 | HR 98 | Temp 98.6°F | Resp 14 | Ht 69.5 in | Wt 192.9 lb

## 2022-06-09 DIAGNOSIS — E538 Deficiency of other specified B group vitamins: Secondary | ICD-10-CM

## 2022-06-09 DIAGNOSIS — D352 Benign neoplasm of pituitary gland: Secondary | ICD-10-CM | POA: Diagnosis not present

## 2022-06-09 DIAGNOSIS — G35 Multiple sclerosis: Secondary | ICD-10-CM | POA: Diagnosis not present

## 2022-06-09 DIAGNOSIS — J302 Other seasonal allergic rhinitis: Secondary | ICD-10-CM

## 2022-06-09 DIAGNOSIS — F902 Attention-deficit hyperactivity disorder, combined type: Secondary | ICD-10-CM | POA: Diagnosis not present

## 2022-06-09 DIAGNOSIS — Z23 Encounter for immunization: Secondary | ICD-10-CM | POA: Diagnosis not present

## 2022-06-09 DIAGNOSIS — R7303 Prediabetes: Secondary | ICD-10-CM

## 2022-06-09 DIAGNOSIS — E785 Hyperlipidemia, unspecified: Secondary | ICD-10-CM

## 2022-06-09 DIAGNOSIS — G4709 Other insomnia: Secondary | ICD-10-CM

## 2022-06-09 DIAGNOSIS — J3089 Other allergic rhinitis: Secondary | ICD-10-CM

## 2022-06-09 DIAGNOSIS — J4520 Mild intermittent asthma, uncomplicated: Secondary | ICD-10-CM

## 2022-06-09 DIAGNOSIS — Z79899 Other long term (current) drug therapy: Secondary | ICD-10-CM | POA: Diagnosis not present

## 2022-06-09 DIAGNOSIS — F330 Major depressive disorder, recurrent, mild: Secondary | ICD-10-CM | POA: Diagnosis not present

## 2022-06-09 DIAGNOSIS — G35A Relapsing-remitting multiple sclerosis: Secondary | ICD-10-CM

## 2022-06-09 MED ORDER — LISDEXAMFETAMINE DIMESYLATE 40 MG PO CAPS: 40.0000 mg | ORAL_CAPSULE | ORAL | 0 refills | Status: DC

## 2022-06-09 MED ORDER — AMITRIPTYLINE HCL 25 MG PO TABS: 25.0000 mg | ORAL_TABLET | Freq: Every day | ORAL | 0 refills | Status: DC

## 2022-06-10 LAB — COMPLETE METABOLIC PANEL WITH GFR
AG Ratio: 1.7 (calc) (ref 1.0–2.5)
ALT: 12 U/L (ref 6–29)
AST: 13 U/L (ref 10–35)
Albumin: 4.3 g/dL (ref 3.6–5.1)
Alkaline phosphatase (APISO): 71 U/L (ref 37–153)
BUN: 16 mg/dL (ref 7–25)
CO2: 32 mmol/L (ref 20–32)
Calcium: 9.3 mg/dL (ref 8.6–10.4)
Chloride: 103 mmol/L (ref 98–110)
Creat: 0.97 mg/dL (ref 0.50–1.03)
Globulin: 2.6 g/dL (calc) (ref 1.9–3.7)
Glucose, Bld: 71 mg/dL (ref 65–99)
Potassium: 5.3 mmol/L (ref 3.5–5.3)
Sodium: 140 mmol/L (ref 135–146)
Total Bilirubin: 0.5 mg/dL (ref 0.2–1.2)
Total Protein: 6.9 g/dL (ref 6.1–8.1)
eGFR: 71 mL/min/{1.73_m2} (ref >=60)

## 2022-06-10 LAB — PROLACTIN: Prolactin: 6.1 ng/mL

## 2022-06-11 ENCOUNTER — Encounter: Payer: Self-pay | Admitting: Family Medicine

## 2022-06-21 ENCOUNTER — Other Ambulatory Visit: Payer: Self-pay | Admitting: Family Medicine

## 2022-06-21 ENCOUNTER — Ambulatory Visit: Payer: Self-pay | Admitting: *Deleted

## 2022-06-21 DIAGNOSIS — F902 Attention-deficit hyperactivity disorder, combined type: Secondary | ICD-10-CM

## 2022-06-21 MED ORDER — LISDEXAMFETAMINE DIMESYLATE 40 MG PO CAPS: 40.0000 mg | ORAL_CAPSULE | ORAL | 0 refills | Status: DC

## 2022-06-21 NOTE — Patient Outreach (Signed)
  Care Coordination   Follow Up Visit Note   06/22/2022 Name: TEAGEN BUCIO MRN: [id number] DOB: [birth year]  SAYA MCCOLL is a 51 y.o. year old female who sees Steele Sizer, MD for primary care. I spoke with  [name] by phone today.  What matters to the patients health and wellness today?  Stress manaagement    Goals Addressed             This Visit's Progress    stress management       Care Coordination Interventions: Patient discussed stress related to finances, family dynamics and her medical condition Patient discussed improvement in mood, stress has stabilized Discussed receiving a promotion at work which will be beneficial financially Ongoing counseling continues to be recommended-patient confirms that she has a follow up appointment at [facility] on 07/01/22  Active listening / Reflection utilized  Emotional Support continues to be provided Positive reinforcement provided for progress made and follow up with mental health counseling          SDOH assessments and interventions completed:  No     Care Coordination Interventions:  Yes, provided   Follow up plan: Follow up call scheduled for 07/05/22    Encounter Outcome:  Pt. Visit Completed

## 2022-06-22 NOTE — Patient Instructions (Signed)
Visit Information  Thank you for taking time to visit with me today. Please don't hesitate to contact me if I can be of assistance to you.   Following are the goals we discussed today:   Goals Addressed             This Visit's Progress    stress management       Care Coordination Interventions: Patient discussed stress related to finances, family dynamics and her medical condition Patient discussed improvement in mood, stress has stabilized Discussed receiving a promotion at work which will be beneficial financially Ongoing counseling continues to be recommended-patient confirms that she has a follow up appointment at [facility] on 07/01/22  Active listening / Reflection utilized  Emotional Support continues to be provided Positive reinforcement provided for progress made and follow up with mental health counseling          Our next appointment is by telephone on 07/05/22 at 9am  Please call the care guide team at [phone] if you need to cancel or reschedule your appointment.   If you are experiencing a Mental Health or [place] or need someone to talk to, please call the Suicide and Crisis Lifeline: 988   Patient verbalizes understanding of instructions and care plan provided today and agrees to view in [place]. Active MyChart status and patient understanding of how to access instructions and care plan via MyChart confirmed with patient.     Telephone follow up appointment with care management team member scheduled for: 07/05/22  Elliot Gurney, [employer] Worker  [facility] Care Management [phone]

## 2022-07-01 ENCOUNTER — Encounter: Payer: BC Managed Care – PPO | Admitting: *Deleted

## 2022-07-01 ENCOUNTER — Ambulatory Visit: Payer: Self-pay | Admitting: *Deleted

## 2022-07-01 DIAGNOSIS — F900 Attention-deficit hyperactivity disorder, predominantly inattentive type: Secondary | ICD-10-CM | POA: Diagnosis not present

## 2022-07-01 DIAGNOSIS — Z79899 Other long term (current) drug therapy: Secondary | ICD-10-CM | POA: Diagnosis not present

## 2022-07-01 DIAGNOSIS — F331 Major depressive disorder, recurrent, moderate: Secondary | ICD-10-CM | POA: Diagnosis not present

## 2022-07-01 NOTE — Patient Outreach (Signed)
  Care Coordination   07/01/2022 Name: Robin Chan MRN: [id number] DOB: [birth year]   Care Coordination Outreach Attempts:  An unsuccessful telephone outreach was attempted for a scheduled appointment today.  Follow Up Plan:  Additional outreach attempts will be made to offer the patient care coordination information and services.   Encounter Outcome:  No Answer   Care Coordination Interventions:  No, not indicated     Valente David, RN, MSN, [facility] Care Management Care Management Coordinator [phone]

## 2022-07-05 ENCOUNTER — Ambulatory Visit: Payer: Self-pay | Admitting: *Deleted

## 2022-07-05 NOTE — Patient Instructions (Signed)
Visit Information  Thank you for taking time to visit with me today. Please don't hesitate to contact me if I can be of assistance to you.   Following are the goals we discussed today:   Goals Addressed             This Visit's Progress    stress management       Care Coordination Interventions: Follow up phone call  related to ongoing follow up with mental health provider Patient discussed that the rain over the weekend has effected her both physically and mentally Self care plan discussed: continuing with positive self talk, managing expectations, follow up with [facility] completed on 07/01/22, positive reinforcement provided for ongoing mental health counseling  Active listening / Reflection utilized  Emotional Support continues to be provided Positive reinforcement provided for progress made and follow up with mental health counseling  Patient encouraged to call this social worker with additional community resource needs          Please call the care guide team at [phone] if you need to cancel or reschedule your appointment.   If you are experiencing a Mental Health or [place] or need someone to talk to, please call the Suicide and Crisis Lifeline: 988   Patient verbalizes understanding of instructions and care plan provided today and agrees to view in [place]. Active MyChart status and patient understanding of how to access instructions and care plan via MyChart confirmed with patient.     No further follow up required: patient to contact this Education officer, museum with any additional community resource needs  [employer], [place] Worker  [facility] Care Management [phone]

## 2022-07-05 NOTE — Patient Outreach (Signed)
  Care Coordination   Follow Up Visit Note   07/05/2022 Name: Robin Chan MRN: [id number] DOB: 10/01/1970  Robin Chan is a 51 y.o. year old female who sees Steele Sizer, MD for primary care. I spoke with  [name] by phone today.  What matters to the patients health and wellness today?  Mental health counseling and resources    Goals Addressed             This Visit's Progress    stress management       Care Coordination Interventions: Follow up phone call  related to ongoing follow up with mental health provider Patient discussed that the rain over the weekend has effected her both physically and mentally Self care plan discussed: continuing with positive self talk, managing expectations, follow up with [facility] completed on 07/01/22, positive reinforcement provided for ongoing mental health counseling  Active listening / Reflection utilized  Emotional Support continues to be provided Positive reinforcement provided for progress made and follow up with mental health counseling  Patient encouraged to call this social worker with additional community resource needs         SDOH assessments and interventions completed:  No     Care Coordination Interventions:  Yes, provided   Follow up plan: No further intervention required.   Encounter Outcome:  Pt. Visit Completed

## 2022-07-08 ENCOUNTER — Telehealth: Payer: Self-pay | Admitting: *Deleted

## 2022-07-08 NOTE — Progress Notes (Signed)
  Care Coordination Note  07/08/2022 Name: Robin Chan MRN: [id number] DOB: [birth year]  Robin Chan is a 51 y.o. year old female who is a primary care patient of Steele Sizer, MD and is actively engaged with the care management team. I reached out to [name] by phone today to assist with re-scheduling a follow up visit with the RN Case Manager  Follow up plan: Unsuccessful telephone outreach attempt made. A HIPAA compliant phone message was left for the patient providing contact information and requesting a return call.   Julian Hy, [place] Direct Dial: [phone]

## 2022-07-09 NOTE — Progress Notes (Signed)
  Care Coordination Note  07/09/2022 Name: Robin Chan MRN: [id number] DOB: June 07, 1971  Robin Chan is a 51 y.o. year old female who is a primary care patient of Steele Sizer, MD and is actively engaged with the care management team. I reached out to [name] by phone today to assist with re-scheduling a follow up visit with the RN Case Manager  Follow up plan: Telephone appointment with care management team member scheduled for: 07/14/2022  Julian Hy, [place] Direct Dial: [phone]

## 2022-07-12 ENCOUNTER — Other Ambulatory Visit: Payer: Self-pay | Admitting: Family Medicine

## 2022-07-12 DIAGNOSIS — F902 Attention-deficit hyperactivity disorder, combined type: Secondary | ICD-10-CM

## 2022-07-12 MED ORDER — LISDEXAMFETAMINE DIMESYLATE 40 MG PO CAPS: 40.0000 mg | ORAL_CAPSULE | ORAL | 0 refills | Status: DC

## 2022-07-14 ENCOUNTER — Ambulatory Visit: Payer: Self-pay | Admitting: *Deleted

## 2022-07-14 NOTE — Patient Outreach (Signed)
  Care Coordination   Follow Up Visit Note   07/14/2022 Name: Robin Chan MRN: [id number] DOB: August 31, 1970  Robin Chan is a 51 y.o. year old female who sees Steele Sizer, MD for primary care. I spoke with  [name] by phone today.  What matters to the patients health and wellness today?  Ongoing management of stress and MS.  Denies any urgent concerns, encouraged to contact this care manager with questions.      Goals Addressed             This Visit's Progress    COMPLETED: RNCM: Effective Management of MS and Depression   On track    Care Coordination Interventions: Evaluation of current treatment plan related to MS and depression  and patient's adherence to plan as established by provider. The patient state job is stressful but fun at the same time.  Has not had any recent flares for MS, feels it is currently well managed. Advised patient to call the office for changes in MS sx and sx, mood, anxiety, depression, or mental health status Provided education to patient re: working with the care coordination team, review of MD visit notes, and being a support to the patient in helping manage health and well being Reviewed medications with patient and discussed compliance and availability. The patient has been able to get her adderall without difficulty at this time.  Collaborated with LCSW regarding patients depression and dealing with her MS and changes. Ongoing and continued support from the LCSW with resources needed.  Provided patient with mindfulness  educational materials related to helping with anxiety/depression the patient may be experiencing. The patient states she has a lot of social factors impacting her care, currently managing well Reviewed scheduled/upcoming provider appointments including: PCP on 2/21 Social Work referral for ongoing support and education with coping skills for MS and depression  Discussed plans with patient for ongoing care management  follow up and provided patient with direct contact information for care management team Advised patient to discuss changes in her chronic conditions or changes in her mental health and well being  with provider The patient agrees to outreaches from the [facility] and LCSW. Has contact information to call if needs arise between outreaches.   Active listening / Reflection utilized  Emotional Support Provided         SDOH assessments and interventions completed:  No     Care Coordination Interventions:  Yes, provided   Follow up plan: No further intervention required.   Encounter Outcome:  Pt. Visit Completed   Valente David, RN, MSN, [place] Care Management Care Management Coordinator [phone]

## 2022-07-14 NOTE — Patient Instructions (Signed)
Visit Information  Thank you for taking time to visit with me today. Please don't hesitate to contact me if I can be of assistance to you.  Following are the goals we discussed today:  Continue current stress management and adherence to MS medications.   Please call the Suicide and Crisis Lifeline: 988 call the [place] National Suicide Prevention Lifeline: 737-593-9916 or TTY: 587 567 1819 TTY 224-551-8519) to talk to a trained counselor call 1-800-273-TALK (toll free, 24 hour hotline) call 911 if you are experiencing a Mental Health or [place] or need someone to talk to.  Patient verbalizes understanding of instructions and care plan provided today and agrees to view in [place]. Active MyChart status and patient understanding of how to access instructions and care plan via MyChart confirmed with patient.     The patient has been provided with contact information for the care management team and has been advised to call with any health related questions or concerns.   Valente David, RN, MSN, [place] Care Management Care Management Coordinator [phone]

## 2022-08-01 ENCOUNTER — Other Ambulatory Visit: Payer: Self-pay | Admitting: Family Medicine

## 2022-08-01 DIAGNOSIS — J4520 Mild intermittent asthma, uncomplicated: Secondary | ICD-10-CM

## 2022-08-09 DIAGNOSIS — F900 Attention-deficit hyperactivity disorder, predominantly inattentive type: Secondary | ICD-10-CM | POA: Diagnosis not present

## 2022-08-09 DIAGNOSIS — F331 Major depressive disorder, recurrent, moderate: Secondary | ICD-10-CM | POA: Diagnosis not present

## 2022-09-06 ENCOUNTER — Encounter: Payer: Self-pay | Admitting: Family Medicine

## 2022-09-07 ENCOUNTER — Other Ambulatory Visit: Payer: Self-pay

## 2022-09-07 DIAGNOSIS — G4709 Other insomnia: Secondary | ICD-10-CM

## 2022-09-07 MED ORDER — QUETIAPINE FUMARATE 25 MG PO TABS: 25.0000 mg | ORAL_TABLET | Freq: Every day | ORAL | 1 refills | Status: DC

## 2022-09-15 ENCOUNTER — Ambulatory Visit: Payer: BC Managed Care – PPO | Admitting: Family Medicine

## 2022-10-04 NOTE — Progress Notes (Signed)
Name: Robin Chan   MRN: [id number]    DOB: December 30, 1970   Date:10/05/2022       Progress Note  Subjective  Chief Complaint  Follow Up  HPI  ADD: taking medication daily as prescribed, on Adderal XR 20 mg now and she states she has been feeling well with medication  , it helps her stay focused at work. She has only been taking Adderal immediate release on weekends when she sleeps in. She states sometimes XR does not last all day and prefers Vyvanse. She took it for many years and it helped all day and she did not have end of the day crash. She has been forgetful lately and misplaced her credit card, it may be secondary to stress at work and also trying to sale her house and boyfriend has court cases pending - driving without a license.    MS: diagnosed in 2000, but first symptoms in 1991 - she had numbness  on the right side and weakness.  Currently off medication because of multiple side effects. Has intermittent weakness and has used a cane periodically, for left foot drop, she has noticed some shooting pain like a zap on left upper extremity - happens at night - intermittently . Adderall helps with energy level. She is currently working full time [employer]. Last MRI done 12/2019 showed that plaques were stable. Discussed sending her back to neurologist or repeating MRI but she cannot afford it at this time. She took Elavil end of the year but doing well now and stopped taking it again.   Insomnia: she has been off Ambien, we stopped it on the Spring 2018.  She is taking Seroquel low dose qhs and helps her fall asleep ( takes about 20 minutes ) occasionally longer  .No side effects of medications   Major Depression: she was  taking Lexapro for many years and depression was not controlled, we switched to Cymbalta in June 2017, She has been divorced since Fall 2020. She stopped seeing psychiatrist at [facility] She is currently doing well on Duloxetine 60 mg , off the  extra 30 mg  dose due to insurance, she is still taking Seroquel 25 mg at night. Phq 9 is still positive. She is trying to sell her house and is under more stress with boyfriend    Asthma: she is taking singulair daily now , she denies wheezing, cough or SOB at this time. She has not used albuterol lately Unchanged    AR: she is using Flonase, singulair, zyrtec and xyzal daily. She sates always congested , a little worse this time of the year - Spring   Pre-diabetes: denies polyphagia, polydipsia or polyuria, Last A1C was down to 5.5 % , on life style modifications. We will monitor every so often   Pituitary microadenoma: no nipple discharge or double vision, prolactin was normal back in August 2022. Stable   IMPRESSION: 1. Unchanged distribution of white matter lesions consistent with multiple sclerosis. No active demyelinating lesions. 2. 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   Patient Active Problem List   Diagnosis Date Noted   Essential (hemorrhagic) thrombocythemia ([place]) 11/30/2021   Rectocele 07/08/2015   Status post laparoscopic assisted vaginal hysterectomy (LAVH) 07/08/2015   Left lumbar radiculitis 03/28/2015   ADD (attention deficit disorder) 12/31/2014   Allergic to bees 12/31/2014   Insomnia, persistent 12/31/2014   Constipation 12/31/2014   Depression, major, recurrent,  mild ([place]) 12/31/2014   Asthma, mild intermittent 12/31/2014   Migraine with aura and without status migrainosus, not intractable 12/31/2014   Multiple sclerosis, relapsing-remitting ([place]) 12/31/2014   Endometriosis determined by laparoscopy 12/31/2014   Perennial allergic rhinitis 12/31/2014   Pituitary microadenoma ([place]) 12/31/2014   Vitamin D deficiency 12/31/2014   Acquired trigger finger 12/31/2014    Past Surgical History:  Procedure Laterality Date   ABDOMINAL HYSTERECTOMY  05/26/2015   BREAST BIOPSY Right 2005   benign    INCISIONAL HERNIA REPAIR N/A 10/09/2019   Procedure: HERNIA REPAIR INCISIONAL;  Surgeon: Olean Ree, MD;  Location: [facility] ORS;  Service: General;  Laterality: N/A;   INSERTION OF MESH N/A 10/09/2019   Procedure: INSERTION OF MESH;  Surgeon: Olean Ree, MD;  Location: [facility] ORS;  Service: General;  Laterality: N/A;   LAPAROSCOPIC ASSISTED VAGINAL HYSTERECTOMY N/A 05/26/2015   Procedure: LAPAROSCOPIC ASSISTED VAGINAL HYSTERECTOMY, with right salpingectomy;  Surgeon: Brayton Mars, MD;  Location: [facility] ORS;  Service: Gynecology;  Laterality: N/A;   LAPAROTOMY N/A 12/14/2018   Procedure: EXPLORATORY LAPAROTOMY;  Surgeon: Olean Ree, MD;  Location: [facility] ORS;  Service: General;  Laterality: N/A;   OVARY SURGERY Left 09/30/2014   RECTOCELE REPAIR N/A 05/26/2015   Procedure: POSTERIOR REPAIR (RECTOCELE);  Surgeon: Brayton Mars, MD;  Location: [facility] ORS;  Service: Gynecology;  Laterality: N/A;    Family History  Problem Relation Age of Onset   Anemia Mother    Osteoporosis Mother    Mental illness Father        Bipolar   Arthritis Brother    Breast cancer Maternal Aunt     Social History   Tobacco Use   Smoking status: Some Days    Packs/day: 0.25    Types: Cigarettes   Smokeless tobacco: Never  Substance Use Topics   Alcohol use: Yes    Alcohol/week: 14.0 standard drinks of alcohol    Types: 14 Shots of liquor per week    Comment: occasionally, 1-2 shots every night     Current Outpatient Medications:    albuterol (2.5 MG/3ML) 0.083% NEBU 3 mL, albuterol (5 MG/ML) 0.5% NEBU 0.5 mL, as needed., Disp: , Rfl:    albuterol (VENTOLIN HFA) 108 (90 Base) MCG/ACT inhaler, Inhale 1 puff into the lungs every 6 (six) hours as needed for wheezing or shortness of breath. Reported on 09/26/2015, Disp: 24 g, Rfl: 0   amitriptyline (ELAVIL) 25 MG tablet, Take 1 tablet (25 mg total) by mouth at bedtime., Disp: 90 tablet, Rfl: 0   cetirizine (ZYRTEC) 10 MG tablet, Take 10 mg by mouth  at bedtime. , Disp: , Rfl:    Cholecalciferol (VITAMIN D3) 50 MCG (2000 UT) TABS, Take 2,000 Units by mouth daily., Disp: , Rfl:    desogestrel-ethinyl estradiol (APRI) 0.15-30 MG-MCG tablet, Take 1 tablet by mouth daily., Disp: , Rfl:    DULoxetine (CYMBALTA) 60 MG capsule, Take 1 capsule (60 mg total) by mouth daily., Disp: 90 capsule, Rfl: 1   EPINEPHrine 0.3 mg/0.3 mL IJ SOAJ injection, Inject 0.3 mg into the muscle as needed for anaphylaxis. , Disp: , Rfl:    fluticasone (FLONASE) 50 MCG/ACT nasal spray, Place 2 sprays into both nostrils daily., Disp: 48 g, Rfl: 1   levocetirizine (XYZAL) 5 MG tablet, TAKE 1 TABLET(5 MG) BY MOUTH DAILY, Disp: 90 tablet, Rfl: 1   lisdexamfetamine (VYVANSE) 40 MG capsule, Take 1 capsule (40 mg total) by mouth every morning., Disp: 90 capsule, Rfl: 0  montelukast (SINGULAIR) 10 MG tablet, TAKE 1 TABLET(10 MG) BY MOUTH AT BEDTIME, Disp: 90 tablet, Rfl: 1   QUEtiapine (SEROQUEL) 25 MG tablet, Take 1 tablet (25 mg total) by mouth at bedtime., Disp: 90 tablet, Rfl: 1   zolpidem (AMBIEN) 10 MG tablet, Take by mouth., Disp: , Rfl:   Allergies  Allergen Reactions   Penicillins Anaphylaxis    Did it involve swelling of the face/tongue/throat, SOB, or low BP? Yes Did it involve sudden or severe rash/hives, skin peeling, or any reaction on the inside of your mouth or nose? No Did you need to seek medical attention at a hospital or doctor's office? Yes When did it last happen? A long time ago If all above answers are "NO", may proceed with cephalosporin use.   Bee Venom     Bee    I personally reviewed active problem list, medication list, allergies, family history, social history, health maintenance with the patient/caregiver today.   ROS  Constitutional: Negative for fever or weight change.  Respiratory: Negative for cough and shortness of breath.   Cardiovascular: Negative for chest pain or palpitations.  Gastrointestinal: Negative for abdominal pain, no  bowel changes.  Musculoskeletal: Negative for gait problem or joint swelling.  Skin: Negative for rash.  Neurological: Negative for dizziness or headache.  No other specific complaints in a complete review of systems (except as listed in HPI above).   Objective  Vitals:   10/05/22 0936  BP: 118/72  Pulse: 89  Resp: 18  Temp: 97.6 F (36.4 C)  TempSrc: Oral  SpO2: 99%  Weight: 194 lb 1.6 oz (88 kg)  Height: 5' 9.5" (1.765 m)    Body mass index is 28.25 kg/m.  Physical Exam  Constitutional: Patient appears well-developed and well-nourished.  No distress.  HEENT: head atraumatic, normocephalic, pupils equal and reactive to light, neck supple Cardiovascular: Normal rate, regular rhythm and normal heart sounds.  No murmur heard. No BLE edema. Pulmonary/Chest: Effort normal and breath sounds normal. No respiratory distress. Abdominal: Soft.  There is no tenderness. Psychiatric: Patient has a normal mood and affect. behavior is normal. Judgment and thought content normal.    PHQ2/9:    10/05/2022    9:47 AM 06/09/2022    9:51 AM 03/05/2022    8:15 AM 11/30/2021    8:25 AM 10/12/2021    8:25 AM  Depression screen PHQ 2/9  Decreased Interest '1 1 1 1 '$ 0  Down, Depressed, Hopeless 1 0 0 1 0  PHQ - 2 Score '2 1 1 2 '$ 0  Altered sleeping '1 1 1 3 '$ 0  Tired, decreased energy '1 1 3 3 '$ 0  Change in appetite 1 0 0 0 0  Feeling bad or failure about yourself  0 0 0 1 0  Trouble concentrating 1 0 0 0 0  Moving slowly or fidgety/restless 1 0 0 0 0  Suicidal thoughts 0 0 0 0 0  PHQ-9 Score '7 3 5 9 '$ 0  Difficult doing work/chores Not difficult at all Not difficult at all       phq 9 is positive   Fall Risk:    10/05/2022    9:36 AM 06/09/2022    9:49 AM 03/05/2022    8:15 AM 11/30/2021    8:25 AM 10/12/2021    8:25 AM  Fall Risk   Falls in the past year? 0 0 0 0 0  Number falls in past yr:   0 0 0  Injury with Fall?  0 0 0  Risk for fall due to : No Fall Risks No Fall Risks No Fall  Risks No Fall Risks No Fall Risks  Follow up Falls prevention discussed Education provided;Falls prevention discussed;Falls evaluation completed Falls prevention discussed Falls prevention discussed Falls prevention discussed    Functional Status Survey: Is the patient deaf or have difficulty hearing?: No Does the patient have difficulty seeing, even when wearing glasses/contacts?: No Does the patient have difficulty concentrating, remembering, or making decisions?: Yes Does the patient have difficulty walking or climbing stairs?: No Does the patient have difficulty dressing or bathing?: No Does the patient have difficulty doing errands alone such as visiting a doctor's office or shopping?: No    Assessment & Plan  1. Multiple sclerosis, relapsing-remitting (HCC)  Stable   2. Pituitary microadenoma ([place])  Cannot afford repeating MRI at this time  3. Depression, major, recurrent, mild (HCC)  - DULoxetine (CYMBALTA) 60 MG capsule; Take 1 capsule (60 mg total) by mouth daily.  Dispense: 90 capsule; Refill: 1  4. B12 deficiency  Discussed resuming a few times a week   5. Dyslipidemia   6. Perennial allergic rhinitis with seasonal variation  Stable   7. Pre-diabetes  Reminded her of life style modification   8. Mild intermittent asthma without complication  Stable   9. Attention deficit hyperactivity disorder (ADHD), combined type  - lisdexamfetamine (VYVANSE) 40 MG capsule; Take 1 capsule (40 mg total) by mouth every morning.  Dispense: 90 capsule; Refill: 0  10. Other insomnia  Continue Seroquel   11. Vitamin D deficiency   Continue supplementation

## 2022-10-05 ENCOUNTER — Encounter: Payer: Self-pay | Admitting: Family Medicine

## 2022-10-05 ENCOUNTER — Ambulatory Visit (INDEPENDENT_AMBULATORY_CARE_PROVIDER_SITE_OTHER): Payer: BC Managed Care – PPO | Admitting: Family Medicine

## 2022-10-05 VITALS — BP 118/72 | HR 89 | Temp 97.6°F | Resp 18 | Ht 69.5 in | Wt 194.1 lb

## 2022-10-05 DIAGNOSIS — D352 Benign neoplasm of pituitary gland: Secondary | ICD-10-CM | POA: Diagnosis not present

## 2022-10-05 DIAGNOSIS — F330 Major depressive disorder, recurrent, mild: Secondary | ICD-10-CM | POA: Diagnosis not present

## 2022-10-05 DIAGNOSIS — J3089 Other allergic rhinitis: Secondary | ICD-10-CM

## 2022-10-05 DIAGNOSIS — G35 Multiple sclerosis: Secondary | ICD-10-CM

## 2022-10-05 DIAGNOSIS — E559 Vitamin D deficiency, unspecified: Secondary | ICD-10-CM

## 2022-10-05 DIAGNOSIS — J4520 Mild intermittent asthma, uncomplicated: Secondary | ICD-10-CM

## 2022-10-05 DIAGNOSIS — G4709 Other insomnia: Secondary | ICD-10-CM

## 2022-10-05 DIAGNOSIS — J302 Other seasonal allergic rhinitis: Secondary | ICD-10-CM

## 2022-10-05 DIAGNOSIS — R7303 Prediabetes: Secondary | ICD-10-CM

## 2022-10-05 DIAGNOSIS — G35A Relapsing-remitting multiple sclerosis: Secondary | ICD-10-CM

## 2022-10-05 DIAGNOSIS — E538 Deficiency of other specified B group vitamins: Secondary | ICD-10-CM | POA: Diagnosis not present

## 2022-10-05 DIAGNOSIS — E785 Hyperlipidemia, unspecified: Secondary | ICD-10-CM

## 2022-10-05 DIAGNOSIS — F902 Attention-deficit hyperactivity disorder, combined type: Secondary | ICD-10-CM

## 2022-10-05 MED ORDER — DULOXETINE HCL 60 MG PO CPEP: 60.0000 mg | ORAL_CAPSULE | Freq: Every day | ORAL | 1 refills | Status: DC

## 2022-10-05 MED ORDER — LISDEXAMFETAMINE DIMESYLATE 40 MG PO CAPS: 40.0000 mg | ORAL_CAPSULE | ORAL | 0 refills | Status: DC

## 2022-10-14 NOTE — Progress Notes (Signed)
Name: Robin Chan   MRN: [id number]    DOB: 24-Oct-1970   Date:10/15/2022       Progress Note  Subjective  Chief Complaint  Annual Exam  HPI  Patient presents for annual CPE.  Diet: eating at home, packs her lunch  Exercise: discussed 150 minutes per week, she states moves a lot at work   Last Eye Exam: coming up  Last Dental Exam: up to date   [place] from 03/25/2022 in [place]  AUDIT-C Score 0      Depression: Phq 9 is  positive    10/15/2022    8:51 AM 10/05/2022    9:47 AM 06/09/2022    9:51 AM 03/05/2022    8:15 AM 11/30/2021    8:25 AM  Depression screen PHQ 2/9  Decreased Interest 1 1 1 1 1   Down, Depressed, Hopeless 1 1 0 0 1  PHQ - 2 Score 2 2 1 1 2   Altered sleeping 0 1 1 1 3   Tired, decreased energy 1 1 1 3 3   Change in appetite 0 1 0 0 0  Feeling bad or failure about yourself  0 0 0 0 1  Trouble concentrating 0 1 0 0 0  Moving slowly or fidgety/restless 0 1 0 0 0  Suicidal thoughts 0 0 0 0 0  PHQ-9 Score 3 7 3 5 9   Difficult doing work/chores Somewhat difficult Not difficult at all Not difficult at all     Hypertension: BP Readings from Last 3 Encounters:  10/15/22 116/74  10/05/22 118/72  06/09/22 124/72   Obesity: Wt Readings from Last 3 Encounters:  10/15/22 192 lb 11.2 oz (87.4 kg)  10/05/22 194 lb 1.6 oz (88 kg)  06/09/22 192 lb 14.4 oz (87.5 kg)   BMI Readings from Last 3 Encounters:  10/15/22 28.05 kg/m  10/05/22 28.25 kg/m  06/09/22 28.08 kg/m     Vaccines:   Tdap: up to date Shingrix: first dose today  Pneumonia: one dose  Flu: up to date COVID-8: up to date   Hep C Screening: 03/05/21 STD testing and prevention (HIV/chl/gon/syphilis): 12/31/14 - not interested  Intimate partner violence: negative screen  Sexual History : one partner , no pain , discomfort or vaginal discharge  Menstrual History/LMP/Abnormal Bleeding: s/p hysterectomy  Discussed importance  of follow up if any post-menopausal bleeding: yes  Incontinence Symptoms: negative for symptoms   Breast cancer:  - Last Mammogram: 2017, due - BRCA gene screening: N/A  Osteoporosis Prevention : Discussed high calcium and vitamin D supplementation, weight bearing exercises Bone density: N/A   Cervical cancer screening: s/p hysterectomy   Skin cancer: Discussed monitoring for atypical lesions  Colorectal cancer: 11/04/21  - cologuard  Lung cancer:  Low Dose CT Chest recommended if Age 48-80 years, 35 pack-year currently smoking OR have quit w/in 15years. Patient does not qualify for screen   ECG: 12/18/18  Advanced Care Planning: A voluntary discussion about advance care planning including the explanation and discussion of advance directives.  Discussed health care proxy and Living will, and the patient was able to identify a health care proxy as son .  Patient does not have a living will and power of attorney of health care   Lipids: Lab Results  Component Value Date   CHOL 189 03/05/2021   CHOL 190 03/05/2020   CHOL 194 02/02/2019   Lab Results  Component Value Date   HDL 57 03/05/2021  HDL 53 03/05/2020   HDL 52 02/02/2019   Lab Results  Component Value Date   LDLCALC 114 (H) 03/05/2021   LDLCALC 117 (H) 03/05/2020   LDLCALC 121 (H) 02/02/2019   Lab Results  Component Value Date   TRIG 85 03/05/2021   TRIG 96 03/05/2020   TRIG 105 02/02/2019   Lab Results  Component Value Date   CHOLHDL 3.3 03/05/2021   CHOLHDL 3.6 03/05/2020   CHOLHDL 3.7 02/02/2019   No results found for: "LDLDIRECT"  Glucose: Glucose, Bld  Date Value Ref Range Status  06/09/2022 71 65 - 99 mg/dL Final    Comment:    .            Fasting reference interval .   03/05/2021 100 (H) 65 - 99 mg/dL Final    Comment:    .            Fasting reference interval . For someone without known diabetes, a glucose value between 100 and 125 mg/dL is consistent with prediabetes and should be  confirmed with a follow-up test. .   03/05/2020 102 (H) 65 - 99 mg/dL Final    Comment:    .            Fasting reference interval . For someone without known diabetes, a glucose value between 100 and 125 mg/dL is consistent with prediabetes and should be confirmed with a follow-up test. .     Patient Active Problem List   Diagnosis Date Noted   B12 deficiency 10/05/2022   Dyslipidemia 10/05/2022   Pre-diabetes 10/05/2022   Essential (hemorrhagic) thrombocythemia ([place]) 11/30/2021   Rectocele 07/08/2015   Status post laparoscopic assisted vaginal hysterectomy (LAVH) 07/08/2015   Left lumbar radiculitis 03/28/2015   ADD (attention deficit disorder) 12/31/2014   Allergic to bees 12/31/2014   Other insomnia 12/31/2014   Constipation 12/31/2014   Depression, major, recurrent, mild ([place]) 12/31/2014   Asthma, mild intermittent 12/31/2014   Migraine with aura and without status migrainosus, not intractable 12/31/2014   Multiple sclerosis, relapsing-remitting ([place]) 12/31/2014   Endometriosis determined by laparoscopy 12/31/2014   Perennial allergic rhinitis with seasonal variation 12/31/2014   Pituitary microadenoma ([place]) 12/31/2014   Vitamin D deficiency 12/31/2014   Acquired trigger finger 12/31/2014    Past Surgical History:  Procedure Laterality Date   ABDOMINAL HYSTERECTOMY  05/26/2015   BREAST BIOPSY Right 2005   benign   INCISIONAL HERNIA REPAIR N/A 10/09/2019   Procedure: HERNIA REPAIR INCISIONAL;  Surgeon: Olean Ree, MD;  Location: [facility];  Service: General;  Laterality: N/A;   INSERTION OF MESH N/A 10/09/2019   Procedure: INSERTION OF MESH;  Surgeon: Olean Ree, MD;  Location: [facility];  Service: General;  Laterality: N/A;   LAPAROSCOPIC ASSISTED VAGINAL HYSTERECTOMY N/A 05/26/2015   Procedure: LAPAROSCOPIC ASSISTED VAGINAL HYSTERECTOMY, with right salpingectomy;  Surgeon: Brayton Mars, MD;  Location: [facility];  Service: Gynecology;  Laterality: N/A;    LAPAROTOMY N/A 12/14/2018   Procedure: EXPLORATORY LAPAROTOMY;  Surgeon: Olean Ree, MD;  Location: [facility];  Service: General;  Laterality: N/A;   OVARY SURGERY Left 09/30/2014   RECTOCELE REPAIR N/A 05/26/2015   Procedure: POSTERIOR REPAIR (RECTOCELE);  Surgeon: Brayton Mars, MD;  Location: [facility];  Service: Gynecology;  Laterality: N/A;    Family History  Problem Relation Age of Onset   Anemia Mother    Osteoporosis Mother    Mental illness Father        Bipolar  Arthritis Brother    Breast cancer Maternal Aunt     Social History   Socioeconomic History   Marital status: Significant Other    Spouse name: Not on file   Number of children: 2   Years of education: Not on file   Highest education level: Associate degree: academic program  Occupational History   Occupation: Proofreader     Comment: [employer]   Tobacco Use   Smoking status: Some Days    Packs/day: .25    Types: Cigarettes   Smokeless tobacco: Never   Tobacco comments:    She rolls her own cigarette   Vaping Use   Vaping Use: Former  Substance and Sexual Activity   Alcohol use: Yes    Alcohol/week: 14.0 standard drinks of alcohol    Types: 14 Shots of liquor per week    Comment: occasionally, 1-2 shots every night   Drug use: No   Sexual activity: Yes    Partners: Male    Birth control/protection: None    Comment: separated   Other Topics Concern   Not on file  Social History Narrative   She is from [place].    Moved to [place] in 1998 because of husband's job transfer, that is when she stopped working -pending her green-card.   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    She had symptoms of MS in 2000 , daughter was 42 year old and just now found a job at [employer] March 2019   Separated since July 2019       Social Determinants of Health   Financial Resource Strain: Low Risk  (10/15/2022)   Overall Financial Resource Strain (CARDIA)    Difficulty of Paying Living Expenses: Not  hard at all  Food Insecurity: No Food Insecurity (10/15/2022)   Hunger Vital Sign    Worried About Running Out of Food in the Last Year: Never true    Ran Out of Food in the Last Year: Never true  Transportation Needs: No Transportation Needs (10/15/2022)   PRAPARE - Hydrologist (Medical): No    Lack of Transportation (Non-Medical): No  Physical Activity: Inactive (10/15/2022)   Exercise Vital Sign    Days of Exercise per Week: 0 days    Minutes of Exercise per Session: 0 min  Stress: Stress Concern Present (10/15/2022)   [place]    Feeling of Stress : Rather much  Social Connections: Socially Isolated (10/15/2022)   Social Connection and Isolation Panel [NHANES]    Frequency of Communication with Friends and Family: Once a week    Frequency of Social Gatherings with Friends and Family: Never    Attends Religious Services: Never    Marine scientist or Organizations: No    Attends Archivist Meetings: Never    Marital Status: Divorced  Human resources officer Violence: Not At Risk (10/15/2022)   Humiliation, Afraid, Rape, and Kick questionnaire    Fear of Current or Ex-Partner: No    Emotionally Abused: No    Physically Abused: No    Sexually Abused: No     Current Outpatient Medications:    albuterol (2.5 MG/3ML) 0.083% NEBU 3 mL, albuterol (5 MG/ML) 0.5% NEBU 0.5 mL, as needed., Disp: , Rfl:    albuterol (VENTOLIN HFA) 108 (90 Base) MCG/ACT inhaler, Inhale 1 puff into the lungs every 6 (six) hours as needed for wheezing or shortness of breath.  Reported on 09/26/2015, Disp: 24 g, Rfl: 0   amitriptyline (ELAVIL) 25 MG tablet, Take 1 tablet (25 mg total) by mouth at bedtime., Disp: 90 tablet, Rfl: 0   cetirizine (ZYRTEC) 10 MG tablet, Take 10 mg by mouth at bedtime. , Disp: , Rfl:    Cholecalciferol (VITAMIN D3) 50 MCG (2000 UT) TABS, Take 2,000 Units by mouth daily., Disp: , Rfl:     desogestrel-ethinyl estradiol (APRI) 0.15-30 MG-MCG tablet, Take 1 tablet by mouth daily., Disp: , Rfl:    DULoxetine (CYMBALTA) 60 MG capsule, Take 1 capsule (60 mg total) by mouth daily., Disp: 90 capsule, Rfl: 1   EPINEPHrine 0.3 mg/0.3 mL IJ SOAJ injection, Inject 0.3 mg into the muscle as needed for anaphylaxis. , Disp: , Rfl:    fluticasone (FLONASE) 50 MCG/ACT nasal spray, Place 2 sprays into both nostrils daily., Disp: 48 g, Rfl: 1   levocetirizine (XYZAL) 5 MG tablet, TAKE 1 TABLET(5 MG) BY MOUTH DAILY, Disp: 90 tablet, Rfl: 1   lisdexamfetamine (VYVANSE) 40 MG capsule, Take 1 capsule (40 mg total) by mouth every morning., Disp: 90 capsule, Rfl: 0   montelukast (SINGULAIR) 10 MG tablet, TAKE 1 TABLET(10 MG) BY MOUTH AT BEDTIME, Disp: 90 tablet, Rfl: 1   QUEtiapine (SEROQUEL) 25 MG tablet, Take 1 tablet (25 mg total) by mouth at bedtime., Disp: 90 tablet, Rfl: 1  Allergies  Allergen Reactions   Penicillins Anaphylaxis    Did it involve swelling of the face/tongue/throat, SOB, or low BP? Yes Did it involve sudden or severe rash/hives, skin peeling, or any reaction on the inside of your mouth or nose? No Did you need to seek medical attention at a hospital or doctor's office? Yes When did it last happen? A long time ago If all above answers are "NO", may proceed with cephalosporin use.   Bee Venom     Bee     ROS  Constitutional: Negative for fever or weight change.  Respiratory: Negative for cough and shortness of breath.   Cardiovascular: Negative for chest pain or palpitations.  Gastrointestinal: Negative for abdominal pain, no bowel changes.  Musculoskeletal: Negative for gait problem or joint swelling.  Skin: Negative for rash.  Neurological: Negative for dizziness or headache.  No other specific complaints in a complete review of systems (except as listed in HPI above).   Objective  Vitals:   10/15/22 0848  BP: 116/74  Pulse: 97  Resp: 18  Temp: 97.6 F (36.4 C)   SpO2: 95%  Weight: 192 lb 11.2 oz (87.4 kg)  Height: 5' 9.5" (1.765 m)    Body mass index is 28.05 kg/m.  Physical Exam  Constitutional: Patient appears well-developed and well-nourished. No distress.  HENT: Head: Normocephalic and atraumatic. Ears: B TMs ok, no erythema or effusion; Nose: Nose normal. Mouth/Throat: Oropharynx is clear and moist. No oropharyngeal exudate.  Eyes: Conjunctivae and EOM are normal. Pupils are equal, round, and reactive to light. No scleral icterus.  Neck: Normal range of motion. Neck supple. No JVD present. No thyromegaly present.  Cardiovascular: Normal rate, regular rhythm and normal heart sounds.  No murmur heard. No BLE edema. Pulmonary/Chest: Effort normal and breath sounds normal. No respiratory distress. Abdominal: Soft. Bowel sounds are normal, no distension. There is no tenderness. no masses Breast: no lumps or masses, no nipple discharge or rashes FEMALE GENITALIA:  Not done  RECTAL: not done  Musculoskeletal: Normal range of motion, no joint effusions. No gross deformities Neurological: he is alert and  oriented to person, place, and time. No cranial nerve deficit. Coordination, balance, strength, speech and gait are normal.  Skin: Skin is warm and dry. No rash noted. No erythema.  Psychiatric: Patient has a normal mood and affect. behavior is normal. Judgment and thought content normal.    Fall Risk:    10/15/2022    8:49 AM 10/05/2022    9:36 AM 06/09/2022    9:49 AM 03/05/2022    8:15 AM 11/30/2021    8:25 AM  Fall Risk   Falls in the past year? 0 0 0 0 0  Number falls in past yr: 0   0 0  Injury with Fall? 0   0 0  Risk for fall due to :  No Fall Risks No Fall Risks No Fall Risks No Fall Risks  Follow up  Falls prevention discussed Education provided;Falls prevention discussed;Falls evaluation completed Falls prevention discussed Falls prevention discussed     Functional Status Survey: Is the patient deaf or have difficulty  hearing?: No Does the patient have difficulty seeing, even when wearing glasses/contacts?: No Does the patient have difficulty concentrating, remembering, or making decisions?: Yes Does the patient have difficulty walking or climbing stairs?: Yes Does the patient have difficulty dressing or bathing?: No Does the patient have difficulty doing errands alone such as visiting a doctor's office or shopping?: No   Assessment & Plan   1. Well adult exam  - MM 3D SCREENING MAMMOGRAM BILATERAL BREAST; Future - Lipid panel - CBC with Differential/Platelet - COMPLETE METABOLIC PANEL WITH GFR - Hemoglobin A1c - VITAMIN D 25 Hydroxy (Vit-D Deficiency, Fractures) - B12 and Folate Panel - TSH - Prolactin  2. Breast cancer screening by mammogram  - MM 3D SCREENING MAMMOGRAM BILATERAL BREAST; Future  3. Pituitary microadenoma (HCC)  - TSH - Prolactin  4. B12 deficiency  - B12 and Folate Panel  5. Vitamin D deficiency  - VITAMIN D 25 Hydroxy (Vit-D Deficiency, Fractures)  6. Pre-diabetes  - Hemoglobin A1c  7. Dyslipidemia  - Lipid panel  8. Long-term use of high-risk medication  - CBC with Differential/Platelet - COMPLETE METABOLIC PANEL WITH GFR   9. Need for shingles vaccine  - Zoster Recombinant (Shingrix )   -USPSTF grade A and B recommendations reviewed with patient; age-appropriate recommendations, preventive care, screening tests, etc discussed and encouraged; healthy living encouraged; see AVS for patient education given to patient -Discussed importance of 150 minutes of physical activity weekly, eat two servings of fish weekly, eat one serving of tree nuts ( cashews, pistachios, pecans, almonds.[place] Kitchen) every other day, eat 6 servings of fruit/vegetables daily and drink plenty of water and avoid sweet beverages.   -Reviewed Health Maintenance: Yes.

## 2022-10-14 NOTE — Patient Instructions (Signed)
Preventive Care 51-51 Years Old, Female Preventive care refers to lifestyle choices and visits with your health care provider that can promote health and wellness. Preventive care visits are also called wellness exams. What can I expect for my preventive care visit? Counseling Your health care provider may ask you questions about your: Medical history, including: Past medical problems. Family medical history. Pregnancy history. Current health, including: Menstrual cycle. Method of birth control. Emotional well-being. Home life and relationship well-being. Sexual activity and sexual health. Lifestyle, including: Alcohol, nicotine or tobacco, and drug use. Access to firearms. Diet, exercise, and sleep habits. Work and work environment. Sunscreen use. Safety issues such as seatbelt and bike helmet use. Physical exam Your health care provider will check your: Height and weight. These may be used to calculate your BMI (body mass index). BMI is a measurement that tells if you are at a healthy weight. Waist circumference. This measures the distance around your waistline. This measurement also tells if you are at a healthy weight and may help predict your risk of certain diseases, such as type 2 diabetes and high blood pressure. Heart rate and blood pressure. Body temperature. Skin for abnormal spots. What immunizations do I need?  Vaccines are usually given at various ages, according to a schedule. Your health care provider will recommend vaccines for you based on your age, medical history, and lifestyle or other factors, such as travel or where you work. What tests do I need? Screening Your health care provider may recommend screening tests for certain conditions. This may include: Lipid and cholesterol levels. Diabetes screening. This is done by checking your blood sugar (glucose) after you have not eaten for a while (fasting). Pelvic exam and Pap test. Hepatitis B test. Hepatitis C  test. HIV (human immunodeficiency virus) test. STI (sexually transmitted infection) testing, if you are at risk. Lung cancer screening. Colorectal cancer screening. Mammogram. Talk with your health care provider about when you should start having regular mammograms. This may depend on whether you have a family history of breast cancer. BRCA-related cancer screening. This may be done if you have a family history of breast, ovarian, tubal, or peritoneal cancers. Bone density scan. This is done to screen for osteoporosis. Talk with your health care provider about your test results, treatment options, and if necessary, the need for more tests. Follow these instructions at home: Eating and drinking  Eat a diet that includes fresh fruits and vegetables, whole grains, lean protein, and low-fat dairy products. Take vitamin and mineral supplements as recommended by your health care provider. Do not drink alcohol if: Your health care provider tells you not to drink. You are pregnant, may be pregnant, or are planning to become pregnant. If you drink alcohol: Limit how much you have to 0-1 drink a day. Know how much alcohol is in your drink. In the [place] one drink equals one 12 oz bottle of beer (355 mL), one 5 oz glass of wine (148 mL), or one 1 oz glass of hard liquor (44 mL). Lifestyle Brush your teeth every morning and night with fluoride toothpaste. Floss one time each day. Exercise for at least 30 minutes 5 or more days each week. Do not use any products that contain nicotine or tobacco. These products include cigarettes, chewing tobacco, and vaping devices, such as e-cigarettes. If you need help quitting, ask your health care provider. Do not use drugs. If you are sexually active, practice safe sex. Use a condom or other form of protection to   prevent STIs. If you do not wish to become pregnant, use a form of birth control. If you plan to become pregnant, see your health care provider for a  prepregnancy visit. Take aspirin only as told by your health care provider. Make sure that you understand how much to take and what form to take. Work with your health care provider to find out whether it is safe and beneficial for you to take aspirin daily. Find healthy ways to manage stress, such as: Meditation, yoga, or listening to music. Journaling. Talking to a trusted person. Spending time with friends and family. Minimize exposure to UV radiation to reduce your risk of skin cancer. Safety Always wear your seat belt while driving or riding in a vehicle. Do not drive: If you have been drinking alcohol. Do not ride with someone who has been drinking. When you are tired or distracted. While texting. If you have been using any mind-altering substances or drugs. Wear a helmet and other protective equipment during sports activities. If you have firearms in your house, make sure you follow all gun safety procedures. Seek help if you have been physically or sexually abused. What's next? Visit your health care provider once a year for an annual wellness visit. Ask your health care provider how often you should have your eyes and teeth checked. Stay up to date on all vaccines. This information is not intended to replace advice given to you by your health care provider. Make sure you discuss any questions you have with your health care provider. Document Revised: 01/07/2021 Document Reviewed: 01/07/2021 Elsevier Patient Education  2023 [employer]  

## 2022-10-15 ENCOUNTER — Encounter: Payer: Self-pay | Admitting: Family Medicine

## 2022-10-15 ENCOUNTER — Ambulatory Visit (INDEPENDENT_AMBULATORY_CARE_PROVIDER_SITE_OTHER): Payer: BC Managed Care – PPO | Admitting: Family Medicine

## 2022-10-15 VITALS — BP 116/74 | HR 97 | Temp 97.6°F | Resp 18 | Ht 69.5 in | Wt 192.7 lb

## 2022-10-15 DIAGNOSIS — E538 Deficiency of other specified B group vitamins: Secondary | ICD-10-CM | POA: Diagnosis not present

## 2022-10-15 DIAGNOSIS — Z23 Encounter for immunization: Secondary | ICD-10-CM

## 2022-10-15 DIAGNOSIS — E559 Vitamin D deficiency, unspecified: Secondary | ICD-10-CM

## 2022-10-15 DIAGNOSIS — Z79899 Other long term (current) drug therapy: Secondary | ICD-10-CM

## 2022-10-15 DIAGNOSIS — Z1231 Encounter for screening mammogram for malignant neoplasm of breast: Secondary | ICD-10-CM

## 2022-10-15 DIAGNOSIS — Z0000 Encounter for general adult medical examination without abnormal findings: Secondary | ICD-10-CM | POA: Diagnosis not present

## 2022-10-15 DIAGNOSIS — E785 Hyperlipidemia, unspecified: Secondary | ICD-10-CM

## 2022-10-15 DIAGNOSIS — D352 Benign neoplasm of pituitary gland: Secondary | ICD-10-CM

## 2022-10-15 DIAGNOSIS — R7303 Prediabetes: Secondary | ICD-10-CM | POA: Diagnosis not present

## 2022-10-16 LAB — COMPLETE METABOLIC PANEL WITH GFR
AG Ratio: 1.5 (calc) (ref 1.0–2.5)
ALT: 13 U/L (ref 6–29)
AST: 15 U/L (ref 10–35)
Albumin: 4.5 g/dL (ref 3.6–5.1)
Alkaline phosphatase (APISO): 70 U/L (ref 37–153)
BUN/Creatinine Ratio: 25 (calc) — ABNORMAL HIGH (ref 6–22)
BUN: 26 mg/dL — ABNORMAL HIGH (ref 7–25)
CO2: 29 mmol/L (ref 20–32)
Calcium: 9.9 mg/dL (ref 8.6–10.4)
Chloride: 101 mmol/L (ref 98–110)
Creat: 1.05 mg/dL — ABNORMAL HIGH (ref 0.50–1.03)
Globulin: 3.1 g/dL (calc) (ref 1.9–3.7)
Glucose, Bld: 102 mg/dL — ABNORMAL HIGH (ref 65–99)
Potassium: 5.1 mmol/L (ref 3.5–5.3)
Sodium: 139 mmol/L (ref 135–146)
Total Bilirubin: 0.5 mg/dL (ref 0.2–1.2)
Total Protein: 7.6 g/dL (ref 6.1–8.1)
eGFR: 64 mL/min/{1.73_m2} (ref >=60)

## 2022-10-16 LAB — LIPID PANEL
Cholesterol: 218 mg/dL — ABNORMAL HIGH (ref <200)
HDL: 64 mg/dL (ref >=50)
LDL Cholesterol (Calc): 135 mg/dL (calc) — ABNORMAL HIGH
Non-HDL Cholesterol (Calc): 154 mg/dL (calc) — ABNORMAL HIGH (ref <130)
Total CHOL/HDL Ratio: 3.4 (calc) (ref <5.0)
Triglycerides: 90 mg/dL (ref <150)

## 2022-10-16 LAB — CBC WITH DIFFERENTIAL/PLATELET
Absolute Monocytes: 515 cells/uL (ref 200–950)
Basophils Absolute: 59 cells/uL (ref 0–200)
Basophils Relative: 0.9 %
Eosinophils Absolute: 132 cells/uL (ref 15–500)
Eosinophils Relative: 2 %
HCT: 40.5 % (ref 35.0–45.0)
Hemoglobin: 13.6 g/dL (ref 11.7–15.5)
Lymphs Abs: 2462 cells/uL (ref 850–3900)
MCH: 31.1 pg (ref 27.0–33.0)
MCHC: 33.6 g/dL (ref 32.0–36.0)
MCV: 92.5 fL (ref 80.0–100.0)
MPV: 9.4 fL (ref 7.5–12.5)
Monocytes Relative: 7.8 %
Neutro Abs: 3432 cells/uL (ref 1500–7800)
Neutrophils Relative %: 52 %
Platelets: 472 10*3/uL — ABNORMAL HIGH (ref 140–400)
RBC: 4.38 10*6/uL (ref 3.80–5.10)
RDW: 11.8 % (ref 11.0–15.0)
Total Lymphocyte: 37.3 %
WBC: 6.6 10*3/uL (ref 3.8–10.8)

## 2022-10-16 LAB — B12 AND FOLATE PANEL
Folate: 10.2 ng/mL
Vitamin B-12: 382 pg/mL (ref 200–1100)

## 2022-10-16 LAB — VITAMIN D 25 HYDROXY (VIT D DEFICIENCY, FRACTURES): Vit D, 25-Hydroxy: 34 ng/mL (ref 30–100)

## 2022-10-16 LAB — HEMOGLOBIN A1C
Hgb A1c MFr Bld: 5.8 % of total Hgb — ABNORMAL HIGH (ref <5.7)
Mean Plasma Glucose: 120 mg/dL
eAG (mmol/L): 6.6 mmol/L

## 2022-10-16 LAB — TSH: TSH: 1.42 mIU/L

## 2022-10-16 LAB — PROLACTIN: Prolactin: 9.8 ng/mL

## 2022-11-14 ENCOUNTER — Other Ambulatory Visit: Payer: Self-pay | Admitting: Family Medicine

## 2022-11-14 DIAGNOSIS — G4709 Other insomnia: Secondary | ICD-10-CM

## 2022-12-01 DIAGNOSIS — G35 Multiple sclerosis: Secondary | ICD-10-CM | POA: Diagnosis not present

## 2022-12-01 DIAGNOSIS — H43813 Vitreous degeneration, bilateral: Secondary | ICD-10-CM | POA: Diagnosis not present

## 2022-12-01 DIAGNOSIS — D352 Benign neoplasm of pituitary gland: Secondary | ICD-10-CM | POA: Diagnosis not present

## 2023-01-06 ENCOUNTER — Ambulatory Visit
Admission: RE | Admit: 2023-01-06 | Discharge: 2023-01-06 | Disposition: A | Discharge status: Home / Self Care | Payer: BC Managed Care – PPO | Source: Ambulatory Visit | Attending: Family Medicine | Admitting: Family Medicine

## 2023-01-06 DIAGNOSIS — Z1231 Encounter for screening mammogram for malignant neoplasm of breast: Secondary | ICD-10-CM | POA: Diagnosis not present

## 2023-01-06 DIAGNOSIS — Z0000 Encounter for general adult medical examination without abnormal findings: Secondary | ICD-10-CM | POA: Insufficient documentation

## 2023-01-06 NOTE — Progress Notes (Addendum)
Name: Robin Chan   MRN: [id number]    DOB: 1970/12/03   Date:01/07/2023       Progress Note  Subjective  Chief Complaint  Follow Up  HPI  ADD: taking medication daily as prescribed, on Adderal XR 20 mg now and she states she has been feeling well with medication  , it helps her stay focused at work. She has only been taking Adderal immediate release on weekends when she sleeps in. She states sometimes XR was not lasting all day so we switched to Vyvanse 40 mg, she brought the bottle with her today and is a small bottle but states 90 day supply. She states she does not think they dispensed 90 capsules. She has been taking adderal - immediate release that she had at home. We will send a refill today    MS: diagnosed in 2000, but first symptoms in 1991 - she had numbness  on the right side and weakness.  Currently off medication because of multiple side effects. Has intermittent weakness and has used a cane periodically, for left foot drop, she has noticed some shooting pain like a zap on left upper extremity - happens at night - intermittently . Adderall helps with energy level. She is currently working full time [employer]. Last MRI done 12/2019 showed that plaques were stable. Discussed sending her back to neurologist or repeating MRI but she cannot afford it at this time. She states symptoms are stable, still has some cognitive dysfunction. She stopped Elavil again   Insomnia: she has been off Ambien, we stopped it on the Spring 2018.  She is taking Seroquel low dose qhs and helps her fall asleep ( takes about 20 minutes ) occasionally longer  Doing well at this time   Major Depression: she was  taking Lexapro for many years and depression was not controlled, we switched to Cymbalta in June 2017, She has been divorced since Fall 2020. She stopped seeing psychiatrist at [facility] She is currently doing well on Duloxetine 60 mg , off the  extra 30 mg dose due to insurance, she is  still taking Seroquel 25 mg at night. Phq 9  has improved, she is under stress but coping well    Asthma: she is taking singulair daily now , she denies wheezing, cough or SOB at this time. She has not used albuterol lately    AR: she forgets to use flonase, taking xyzal and singulair and symptoms are controlled   Pre-diabetes: denies polyphagia, polydipsia or polyuria, Last A1C was down to 5.5 % , on life style modifications. Stable   Pituitary microadenoma: no nipple discharge or double vision, prolactin was normal back in August 2022. Unchanged  IMPRESSION: 1. Unchanged distribution of white matter lesions consistent with multiple sclerosis. No active demyelinating lesions. 2. 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  Change in bowel movements: cologuard is up to date, she states started after she went to visit her mother, stools are loose, bristol 5-6 , sometimes urgency. Discussed referral to GI but she would like to try miralax first to see if soiling and if no improvement she will contact me  Patient Active Problem List   Diagnosis Date Noted   B12 deficiency 10/05/2022   Dyslipidemia 10/05/2022   Pre-diabetes 10/05/2022   Essential (hemorrhagic) thrombocythemia (HCC) 11/30/2021   Rectocele 07/08/2015   Status post laparoscopic assisted vaginal hysterectomy (LAVH) 07/08/2015   Left  lumbar radiculitis 03/28/2015   ADD (attention deficit disorder) 12/31/2014   Allergic to bees 12/31/2014   Other insomnia 12/31/2014   Constipation 12/31/2014   Depression, major, recurrent, mild (HCC) 12/31/2014   Asthma, mild intermittent 12/31/2014   Migraine with aura and without status migrainosus, not intractable 12/31/2014   Multiple sclerosis, relapsing-remitting (HCC) 12/31/2014   Endometriosis determined by laparoscopy 12/31/2014   Perennial allergic rhinitis with seasonal variation 12/31/2014   Pituitary microadenoma  (HCC) 12/31/2014   Vitamin D deficiency 12/31/2014   Acquired trigger finger 12/31/2014    Past Surgical History:  Procedure Laterality Date   ABDOMINAL HYSTERECTOMY  05/26/2015   BREAST BIOPSY Right 2005   benign   INCISIONAL HERNIA REPAIR N/A 10/09/2019   Procedure: HERNIA REPAIR INCISIONAL;  Surgeon: Henrene Dodge, MD;  Location: [facility];  Service: General;  Laterality: N/A;   INSERTION OF MESH N/A 10/09/2019   Procedure: INSERTION OF MESH;  Surgeon: Henrene Dodge, MD;  Location: [facility];  Service: General;  Laterality: N/A;   LAPAROSCOPIC ASSISTED VAGINAL HYSTERECTOMY N/A 05/26/2015   Procedure: LAPAROSCOPIC ASSISTED VAGINAL HYSTERECTOMY, with right salpingectomy;  Surgeon: Herold Harms, MD;  Location: [facility];  Service: Gynecology;  Laterality: N/A;   LAPAROTOMY N/A 12/14/2018   Procedure: EXPLORATORY LAPAROTOMY;  Surgeon: Henrene Dodge, MD;  Location: [facility];  Service: General;  Laterality: N/A;   OVARY SURGERY Left 09/30/2014   RECTOCELE REPAIR N/A 05/26/2015   Procedure: POSTERIOR REPAIR (RECTOCELE);  Surgeon: Herold Harms, MD;  Location: [facility];  Service: Gynecology;  Laterality: N/A;    Family History  Problem Relation Age of Onset   Anemia Mother    Osteoporosis Mother    Mental illness Father        Bipolar   Arthritis Brother    Breast cancer Maternal Aunt     Social History   Tobacco Use   Smoking status: Some Days    Packs/day: .25    Types: Cigarettes   Smokeless tobacco: Never   Tobacco comments:    She rolls her own cigarette   Substance Use Topics   Alcohol use: Yes    Alcohol/week: 14.0 standard drinks of alcohol    Types: 14 Shots of liquor per week    Comment: occasionally, 1-2 shots every night     Current Outpatient Medications:    albuterol (VENTOLIN HFA) 108 (90 Base) MCG/ACT inhaler, Inhale 1 puff into the lungs every 6 (six) hours as needed for wheezing or shortness of breath. Reported on 09/26/2015, Disp: 24 g, Rfl: 0    amphetamine-dextroamphetamine (ADDERALL) 10 MG tablet, Take 10 mg by mouth daily with breakfast., Disp: , Rfl:    cetirizine (ZYRTEC) 10 MG tablet, Take 10 mg by mouth at bedtime. , Disp: , Rfl:    Cholecalciferol (VITAMIN D3) 50 MCG (2000 UT) TABS, Take 2,000 Units by mouth daily., Disp: , Rfl:    DULoxetine (CYMBALTA) 60 MG capsule, Take 1 capsule (60 mg total) by mouth daily., Disp: 90 capsule, Rfl: 1   EPINEPHrine 0.3 mg/0.3 mL IJ SOAJ injection, Inject 0.3 mg into the muscle as needed for anaphylaxis. , Disp: , Rfl:    levocetirizine (XYZAL) 5 MG tablet, TAKE 1 TABLET(5 MG) BY MOUTH DAILY, Disp: 90 tablet, Rfl: 1   lisdexamfetamine (VYVANSE) 40 MG capsule, Take 1 capsule (40 mg total) by mouth every morning., Disp: 90 capsule, Rfl: 0   montelukast (SINGULAIR) 10 MG tablet, TAKE 1 TABLET(10 MG) BY MOUTH AT BEDTIME, Disp: 90 tablet,  Rfl: 1   QUEtiapine (SEROQUEL) 25 MG tablet, Take 1 tablet (25 mg total) by mouth at bedtime., Disp: 90 tablet, Rfl: 1   amitriptyline (ELAVIL) 25 MG tablet, Take 1 tablet (25 mg total) by mouth at bedtime. (Patient not taking: Reported on 01/07/2023), Disp: 90 tablet, Rfl: 0   fluticasone (FLONASE) 50 MCG/ACT nasal spray, Place 2 sprays into both nostrils daily. (Patient not taking: Reported on 01/07/2023), Disp: 48 g, Rfl: 1  Allergies  Allergen Reactions   Penicillins Anaphylaxis    Did it involve swelling of the face/tongue/throat, SOB, or low BP? Yes Did it involve sudden or severe rash/hives, skin peeling, or any reaction on the inside of your mouth or nose? No Did you need to seek medical attention at a hospital or doctor's office? Yes When did it last happen? A long time ago If all above answers are "NO", may proceed with cephalosporin use.   Bee Venom     Bee    I personally reviewed active problem list, medication list, allergies, family history, social history, health maintenance with the patient/caregiver today.   ROS  Ten systems reviewed and  is negative except as mentioned in HPI   Objective  Vitals:   01/07/23 0930  BP: 118/70  Pulse: 93  Resp: 16  SpO2: 97%  Weight: 196 lb (88.9 kg)  Height: 5\' 9"  (1.753 m)    Body mass index is 28.94 kg/m.  Physical Exam  Constitutional: Patient appears well-developed and well-nourished. Obese  No distress.  HEENT: head atraumatic, normocephalic, pupils equal and reactive to light, neck supple Cardiovascular: Normal rate, regular rhythm and normal heart sounds.  No murmur heard. No BLE edema. Pulmonary/Chest: Effort normal and breath sounds normal. No respiratory distress. Abdominal: Soft.  There is no tenderness. Psychiatric: Patient has a normal mood and affect. behavior is normal. Judgment and thought content normal.    PHQ2/9:    01/07/2023    9:33 AM 10/15/2022    8:51 AM 10/05/2022    9:47 AM 06/09/2022    9:51 AM 03/05/2022    8:15 AM  Depression screen PHQ 2/9  Decreased Interest 0 1 1 1 1   Down, Depressed, Hopeless 0 1 1 0 0  PHQ - 2 Score 0 2 2 1 1   Altered sleeping 2 0 1 1 1   Tired, decreased energy 2 1 1 1 3   Change in appetite 0 0 1 0 0  Feeling bad or failure about yourself  0 0 0 0 0  Trouble concentrating 1 0 1 0 0  Moving slowly or fidgety/restless 0 0 1 0 0  Suicidal thoughts 0 0 0 0 0  PHQ-9 Score 5 3 7 3 5   Difficult doing work/chores  Somewhat difficult Not difficult at all Not difficult at all     phq 9 is negative   Fall Risk:    01/07/2023    9:33 AM 10/15/2022    8:49 AM 10/05/2022    9:36 AM 06/09/2022    9:49 AM 03/05/2022    8:15 AM  Fall Risk   Falls in the past year? 0 0 0 0 0  Number falls in past yr: 0 0   0  Injury with Fall? 0 0   0  Risk for fall due to : No Fall Risks  No Fall Risks No Fall Risks No Fall Risks  Follow up Falls prevention discussed  Falls prevention discussed Education provided;Falls prevention discussed;Falls evaluation completed Falls prevention discussed  Functional Status Survey: Is the patient  deaf or have difficulty hearing?: No Does the patient have difficulty seeing, even when wearing glasses/contacts?: No Does the patient have difficulty concentrating, remembering, or making decisions?: Yes Does the patient have difficulty walking or climbing stairs?: Yes Does the patient have difficulty dressing or bathing?: No Does the patient have difficulty doing errands alone such as visiting a doctor's office or shopping?: No    Assessment & Plan  1. Pituitary microadenoma (HCC)  Stable   2. Depression, major, recurrent, mild (HCC)  Stable  3. Multiple sclerosis, relapsing-remitting (HCC)  Does not want to see neurologist at this time  4. Dyslipidemia  Last LDL was 135   5. Perennial allergic rhinitis with seasonal variation  Doing well   6. Mild intermittent asthma without complication  Controlled  7. Attention deficit hyperactivity disorder (ADHD), combined type  - lisdexamfetamine (VYVANSE) 40 MG capsule; Take 1 capsule (40 mg total) by mouth every morning.  Dispense: 90 capsule; Refill: 0

## 2023-01-07 ENCOUNTER — Encounter: Payer: Self-pay | Admitting: Family Medicine

## 2023-01-07 ENCOUNTER — Ambulatory Visit (INDEPENDENT_AMBULATORY_CARE_PROVIDER_SITE_OTHER): Payer: BC Managed Care – PPO | Admitting: Family Medicine

## 2023-01-07 VITALS — BP 118/70 | HR 93 | Resp 16 | Ht 69.0 in | Wt 196.0 lb

## 2023-01-07 DIAGNOSIS — R198 Other specified symptoms and signs involving the digestive system and abdomen: Secondary | ICD-10-CM

## 2023-01-07 DIAGNOSIS — F330 Major depressive disorder, recurrent, mild: Secondary | ICD-10-CM | POA: Diagnosis not present

## 2023-01-07 DIAGNOSIS — D352 Benign neoplasm of pituitary gland: Secondary | ICD-10-CM

## 2023-01-07 DIAGNOSIS — E785 Hyperlipidemia, unspecified: Secondary | ICD-10-CM | POA: Diagnosis not present

## 2023-01-07 DIAGNOSIS — G35 Multiple sclerosis: Secondary | ICD-10-CM

## 2023-01-07 DIAGNOSIS — F902 Attention-deficit hyperactivity disorder, combined type: Secondary | ICD-10-CM

## 2023-01-07 DIAGNOSIS — J3089 Other allergic rhinitis: Secondary | ICD-10-CM

## 2023-01-07 DIAGNOSIS — J302 Other seasonal allergic rhinitis: Secondary | ICD-10-CM

## 2023-01-07 DIAGNOSIS — J4520 Mild intermittent asthma, uncomplicated: Secondary | ICD-10-CM

## 2023-01-07 MED ORDER — LISDEXAMFETAMINE DIMESYLATE 40 MG PO CAPS: 40.0000 mg | ORAL_CAPSULE | ORAL | 0 refills | Status: DC

## 2023-01-10 ENCOUNTER — Other Ambulatory Visit: Payer: Self-pay | Admitting: Family Medicine

## 2023-01-10 ENCOUNTER — Telehealth: Payer: Self-pay | Admitting: Family Medicine

## 2023-01-10 DIAGNOSIS — F902 Attention-deficit hyperactivity disorder, combined type: Secondary | ICD-10-CM

## 2023-01-10 NOTE — Telephone Encounter (Signed)
Requested medications are due for refill today.  No  Requested medications are on the active medications list.  yes  Last refill. 01/07/2023 #90 0   Future visit scheduled.   yes  Notes to clinic.  Refill/refusal not delegated.    Requested Prescriptions  Pending Prescriptions Disp Refills   lisdexamfetamine (VYVANSE) 40 MG capsule 90 capsule 0    Sig: Take 1 capsule (40 mg total) by mouth every morning.     Not Delegated - Psychiatry:  Stimulants/ADHD Failed - 01/10/2023  9:09 AM      Failed - This refill cannot be delegated      Failed - Urine Drug Screen completed in last 360 days      Passed - Last BP in normal range    BP Readings from Last 1 Encounters:  01/07/23 118/70         Passed - Last Heart Rate in normal range    Pulse Readings from Last 1 Encounters:  01/07/23 93         Passed - Valid encounter within last 6 months    Recent Outpatient Visits           3 days ago Pituitary microadenoma [facility])   [facility] Alba Cory, MD   2 months ago Well adult exam   [facility] Alba Cory, MD   3 months ago Multiple sclerosis, relapsing-remitting [facility])   [facility] Alba Cory, MD   7 months ago Attention deficit hyperactivity disorder (ADHD), combined type   [facility] Alba Cory, MD   10 months ago Multiple sclerosis, relapsing-remitting [facility])   [facility] Alba Cory, MD       Future Appointments             In 2 months Carlynn Purl, Danna Hefty, MD [facility], [facility]   In 9 months Alba Cory, MD [facility], [facility]

## 2023-01-10 NOTE — Telephone Encounter (Signed)
Patient request early refill, pharmacy can't refill until 01/17/23.

## 2023-01-10 NOTE — Telephone Encounter (Signed)
Medication Refill - Medication: lisdexamfetamine (VYVANSE) 40 MG capsule [[id number]]   Pharmacy is calling to report that the patient claims that the pharmacy shorted her medication. Not possible. Pt is needing an early fill.  Has the patient contacted their pharmacy? Yes.   (Agent: If no, request that the patient contact the pharmacy for the refill. If patient does not wish to contact the pharmacy document the reason why and proceed with request.) (Agent: If yes, when and what did the pharmacy advise?)  Preferred Pharmacy (with phone number or street name): [facility] [address] [place], [place] - [address] Phone: [phone]  Fax: [phone]   Has the patient been seen for an appointment in the last year OR does the patient have an upcoming appointment? Yes.    Agent: Please be advised that RX refills may take up to 3 business days. We ask that you follow-up with your pharmacy.

## 2023-01-10 NOTE — Telephone Encounter (Signed)
Medication Refill - Medication: lisdexamfetamine (VYVANSE) 40 MG capsule  Pt is out of this med, she told pharmacy she was shorted on the last refill, but they say she wasn't They can't fill until June 24 but asking for early refilll  Has the patient contacted their pharmacy? yes (Agent: If no, request that the patient contact the pharmacy for the refill. If patient does not wish to contact the pharmacy document the reason why and proceed with request.) (Agent: If yes, when and what did the pharmacy advise?)pharmacy called directly in  Preferred Pharmacy (with phone number or street name): [facility] [address], [place] - [address] [place] [address] Phone: [phone] Fax: [phone] Has the patient been seen for an appointment in the last year OR does the patient have an upcoming appointment? yes  Agent: Please be advised that RX refills may take up to 3 business days. We ask that you follow-up with your pharmacy.

## 2023-01-14 ENCOUNTER — Encounter: Payer: Self-pay | Admitting: Family Medicine

## 2023-01-17 ENCOUNTER — Other Ambulatory Visit: Payer: Self-pay | Admitting: Family Medicine

## 2023-01-17 MED ORDER — AMPHETAMINE-DEXTROAMPHET ER 20 MG PO CP24: 20.0000 mg | ORAL_CAPSULE | ORAL | 0 refills | Status: DC

## 2023-04-06 NOTE — Progress Notes (Unsigned)
Name: Robin Chan   MRN: [id number]    DOB: [birth year]   Date:04/07/2023       Progress Note  Subjective  Chief Complaint  Follow Up  HPI  ADD: taking medication daily as prescribed, on Adderal XR 20 mg now and she states she has been feeling well with medication  , it helps her stay focused at work.. She states sometimes XR was not lasting all day so we switched to Vyvanse 40 mg, but there was a shortage and she is back on Adderal XR, she is out of medication 12 days earlier. Explained I cannot fill it until 09/20 . Advised to use a lock box for controlled medication. We will switch back to [facility].    MS: diagnosed in 2000, but first symptoms in 1991 - she had numbness  on the right side and weakness.  Currently off medication because of multiple side effects. Has intermittent weakness and has used a cane periodically, for left foot drop, she has noticed some shooting pain like a zap on left upper extremity - happens at night - intermittently . Adderall helps with energy level. She is currently working full time [employer]. Last MRI done 12/2019 showed that plaques were stable. Discussed sending her back to neurologist or repeating MRI but she cannot afford it at this time. She has noticed worsening of symptoms having intermittent dysarthria, more frequent episodes of foot drops   Insomnia: she has been off Ambien, we stopped it on the Spring 2018.  She is taking Seroquel low dose qhs and used to work well, but has noticed that sometimes she cannot sleep, we will add hydroxizine to take prn   Major Depression: she was  taking Lexapro for many years and depression was not controlled, we switched to Cymbalta in June 2017, She has been divorced since Fall 2020. She stopped seeing psychiatrist at [facility] She is currently doing well on Duloxetine 60 mg , off the  extra 30 mg dose due to insurance, and phq 9 is up, she is still taking Seroquel 25 mg at night . Phq 9  is worse, she  is concerned about not having enough Adderal XR pills - wants to go back to [facility], advised to use a lock box for controlled medications.    Asthma: she is taking singulair daily now , she denies wheezing, cough or SOB at this time. She only uses albuterol very seldom    AR: she forgets to use flonase, taking xyzal and singulair and symptoms are controlled at this time   Pre-diabetes: denies polyphagia, polydipsia or polyuria, Last A1C was up again at 5.8 % , she has been eating late, needs to cut down on carbohydrates   Pituitary microadenoma: no nipple discharge or double vision, prolactin was normal March 2024. No headaches   IMPRESSION: last MRI 2021  1. Unchanged distribution of white matter lesions consistent with multiple sclerosis. No active demyelinating lesions. 2. 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   Patient Active Problem List   Diagnosis Date Noted   B12 deficiency 10/05/2022   Dyslipidemia 10/05/2022   Pre-diabetes 10/05/2022   Essential (hemorrhagic) thrombocythemia (HCC) 11/30/2021   Rectocele 07/08/2015   Status post laparoscopic assisted vaginal hysterectomy (LAVH) 07/08/2015   Left lumbar radiculitis 03/28/2015   ADD (attention deficit disorder) 12/31/2014   Allergic to bees 12/31/2014   Other insomnia 12/31/2014   Constipation 12/31/2014  Depression, major, recurrent, mild (HCC) 12/31/2014   Asthma, mild intermittent 12/31/2014   Migraine with aura and without status migrainosus, not intractable 12/31/2014   Multiple sclerosis, relapsing-remitting (HCC) 12/31/2014   Endometriosis determined by laparoscopy 12/31/2014   Perennial allergic rhinitis with seasonal variation 12/31/2014   Pituitary microadenoma (HCC) 12/31/2014   Vitamin D deficiency 12/31/2014   Acquired trigger finger 12/31/2014    Past Surgical History:  Procedure Laterality Date   ABDOMINAL HYSTERECTOMY   05/26/2015   BREAST BIOPSY Right 2005   benign   INCISIONAL HERNIA REPAIR N/A 10/09/2019   Procedure: HERNIA REPAIR INCISIONAL;  Surgeon: Henrene Dodge, MD;  Location: [facility] ORS;  Service: General;  Laterality: N/A;   INSERTION OF MESH N/A 10/09/2019   Procedure: INSERTION OF MESH;  Surgeon: Henrene Dodge, MD;  Location: [facility];  Service: General;  Laterality: N/A;   LAPAROSCOPIC ASSISTED VAGINAL HYSTERECTOMY N/A 05/26/2015   Procedure: LAPAROSCOPIC ASSISTED VAGINAL HYSTERECTOMY, with right salpingectomy;  Surgeon: Herold Harms, MD;  Location: [facility] OR[facility];  Service: Gynecology;  Laterality: N/A;   LAPAROTOMY N/A 12/14/2018   Procedure: EXPLORATORY LAPAROTOMY;  Surgeon: Henrene Dodge, MD;  Location: [facility];  Service: General;  Laterality: N/A;   OVARY SURGERY Left 09/30/2014   RECTOCELE REPAIR N/A 05/26/2015   Procedure: POSTERIOR REPAIR (RECTOCELE);  Surgeon: Herold Harms, MD;  Location: [facility];  Service: Gynecology;  Laterality: N/A;    Family History  Problem Relation Age of Onset   Anemia Mother    Osteoporosis Mother    Mental illness Father        Bipolar   Arthritis Brother    Breast cancer Maternal Aunt     Social History   Tobacco Use   Smoking status: Some Days    Current packs/day: 0.25    Types: Cigarettes   Smokeless tobacco: Never   Tobacco comments:    She rolls her own cigarette   Substance Use Topics   Alcohol use: Yes    Alcohol/week: 14.0 standard drinks of alcohol    Types: 14 Shots of liquor per week    Comment: occasionally, 1-2 shots every night     Current Outpatient Medications:    albuterol (VENTOLIN HFA) 108 (90 Base) MCG/ACT inhaler, Inhale 1 puff into the lungs every 6 (six) hours as needed for wheezing or shortness of breath. Reported on 09/26/2015, Disp: 24 g, Rfl: 0   amphetamine-dextroamphetamine (ADDERALL XR) 20 MG 24 hr capsule, Take 1 capsule (20 mg total) by mouth every morning., Disp: 90 capsule, Rfl: 0   cetirizine  (ZYRTEC) 10 MG tablet, Take 10 mg by mouth at bedtime. , Disp: , Rfl:    Cholecalciferol (VITAMIN D3) 50 MCG (2000 UT) TABS, Take 2,000 Units by mouth daily., Disp: , Rfl:    DULoxetine (CYMBALTA) 60 MG capsule, Take 1 capsule (60 mg total) by mouth daily., Disp: 90 capsule, Rfl: 1   EPINEPHrine 0.3 mg/0.3 mL IJ SOAJ injection, Inject 0.3 mg into the muscle as needed for anaphylaxis. , Disp: , Rfl:    levocetirizine (XYZAL) 5 MG tablet, TAKE 1 TABLET(5 MG) BY MOUTH DAILY, Disp: 90 tablet, Rfl: 1   montelukast (SINGULAIR) 10 MG tablet, TAKE 1 TABLET(10 MG) BY MOUTH AT BEDTIME, Disp: 90 tablet, Rfl: 1   QUEtiapine (SEROQUEL) 25 MG tablet, Take 1 tablet (25 mg total) by mouth at bedtime., Disp: 90 tablet, Rfl: 1  Allergies  Allergen Reactions   Penicillins Anaphylaxis    Did it involve swelling of the  face/tongue/throat, SOB, or low BP? Yes Did it involve sudden or severe rash/hives, skin peeling, or any reaction on the inside of your mouth or nose? No Did you need to seek medical attention at a hospital or doctor's office? Yes When did it last happen? A long time ago If all above answers are "NO", may proceed with cephalosporin use.   Bee Venom     Bee    I personally reviewed active problem list, medication list, allergies, family history, social history, health maintenance with the patient/caregiver today.   ROS  Ten systems reviewed and is negative except as mentioned in HPI    Objective  Vitals:   04/07/23 0924  BP: 120/72  Pulse: 90  Resp: 16  SpO2: 97%  Weight: 200 lb (90.7 kg)  Height: 5\' 9"  (1.753 m)    Body mass index is 29.53 kg/m.  Physical Exam  Constitutional: Patient appears well-developed and well-nourished. No distress.  HEENT: head atraumatic, normocephalic, pupils equal and reactive to light, neck supple Cardiovascular: Normal rate, regular rhythm and normal heart sounds.  No murmur heard. No BLE edema. Pulmonary/Chest: Effort normal and breath sounds  normal. No respiratory distress. Abdominal: Soft.  There is no tenderness. Neuro: some dysarthria noticed today  Psychiatric: Patient has a normal mood and affect. behavior is normal. Judgment and thought content normal.    PHQ2/9:    04/07/2023    9:24 AM 01/07/2023    9:33 AM 10/15/2022    8:51 AM 10/05/2022    9:47 AM 06/09/2022    9:51 AM  Depression screen PHQ 2/9  Decreased Interest 1 0 1 1 1   Down, Depressed, Hopeless 1 0 1 1 0  PHQ - 2 Score 2 0 2 2 1   Altered sleeping 3 2 0 1 1  Tired, decreased energy 3 2 1 1 1   Change in appetite 0 0 0 1 0  Feeling bad or failure about yourself  0 0 0 0 0  Trouble concentrating 3 1 0 1 0  Moving slowly or fidgety/restless 0 0 0 1 0  Suicidal thoughts 0 0 0 0 0  PHQ-9 Score 11 5 3 7 3   Difficult doing work/chores   Somewhat difficult Not difficult at all Not difficult at all    phq 9 is positive   Fall Risk:    04/07/2023    9:22 AM 01/07/2023    9:33 AM 10/15/2022    8:49 AM 10/05/2022    9:36 AM 06/09/2022    9:49 AM  Fall Risk   Falls in the past year? 1 0 0 0 0  Number falls in past yr: 0 0 0    Injury with Fall? 0 0 0    Risk for fall due to : No Fall Risks No Fall Risks  No Fall Risks No Fall Risks  Follow up Falls prevention discussed Falls prevention discussed  Falls prevention discussed Education provided;Falls prevention discussed;Falls evaluation completed     Functional Status Survey: Is the patient deaf or have difficulty hearing?: No Does the patient have difficulty seeing, even when wearing glasses/contacts?: No Does the patient have difficulty concentrating, remembering, or making decisions?: Yes Does the patient have difficulty walking or climbing stairs?: Yes Does the patient have difficulty dressing or bathing?: No Does the patient have difficulty doing errands alone such as visiting a doctor's office or shopping?: No    Assessment & Plan  1. Depression, major, recurrent, mild (HCC)  - DULoxetine  (CYMBALTA) 60  MG capsule; Take 1 capsule (60 mg total) by mouth daily.  Dispense: 90 capsule; Refill: 1  2. Multiple sclerosis, relapsing-remitting [facility])  Not seeing neurologist   3. Pituitary microadenoma (HCC)  stable  4. Mild intermittent asthma without complication  - montelukast (SINGULAIR) 10 MG tablet; Take 1 tablet (10 mg total) by mouth at bedtime.  Dispense: 90 tablet; Refill: 1  5. Other insomnia  - hydrOXYzine (ATARAX) 10 MG tablet; Take 1-2 tablets (10-20 mg total) by mouth at bedtime as needed.  Dispense: 60 tablet; Refill: 0 - QUEtiapine (SEROQUEL) 25 MG tablet; Take 1 tablet (25 mg total) by mouth at bedtime.  Dispense: 90 tablet; Refill: 1  6. Attention deficit hyperactivity disorder (ADHD), combined type  - amphetamine-dextroamphetamine (ADDERALL XR) 20 MG 24 hr capsule; Take 1 capsule (20 mg total) by mouth every morning.  Dispense: 90 capsule; Refill: 0  7. Vitamin D deficiency  Continue supplementation  8. Pre-diabetes  Reviewed low carb diet  9. Needs flu shot  Today

## 2023-04-07 ENCOUNTER — Encounter: Payer: Self-pay | Admitting: Family Medicine

## 2023-04-07 ENCOUNTER — Ambulatory Visit (INDEPENDENT_AMBULATORY_CARE_PROVIDER_SITE_OTHER): Payer: No Typology Code available for payment source | Admitting: Family Medicine

## 2023-04-07 VITALS — BP 120/72 | HR 90 | Resp 16 | Ht 69.0 in | Wt 200.0 lb

## 2023-04-07 DIAGNOSIS — F902 Attention-deficit hyperactivity disorder, combined type: Secondary | ICD-10-CM | POA: Diagnosis not present

## 2023-04-07 DIAGNOSIS — R7303 Prediabetes: Secondary | ICD-10-CM

## 2023-04-07 DIAGNOSIS — Z23 Encounter for immunization: Secondary | ICD-10-CM

## 2023-04-07 DIAGNOSIS — D352 Benign neoplasm of pituitary gland: Secondary | ICD-10-CM

## 2023-04-07 DIAGNOSIS — G35 Multiple sclerosis: Secondary | ICD-10-CM | POA: Diagnosis not present

## 2023-04-07 DIAGNOSIS — F330 Major depressive disorder, recurrent, mild: Secondary | ICD-10-CM

## 2023-04-07 DIAGNOSIS — E559 Vitamin D deficiency, unspecified: Secondary | ICD-10-CM

## 2023-04-07 DIAGNOSIS — G4709 Other insomnia: Secondary | ICD-10-CM | POA: Diagnosis not present

## 2023-04-07 DIAGNOSIS — J4520 Mild intermittent asthma, uncomplicated: Secondary | ICD-10-CM | POA: Diagnosis not present

## 2023-04-07 MED ORDER — QUETIAPINE FUMARATE 25 MG PO TABS
25.0000 mg | ORAL_TABLET | Freq: Every day | ORAL | 1 refills | Status: DC
Start: 2023-04-07 — End: 2023-10-14

## 2023-04-07 MED ORDER — AMPHETAMINE-DEXTROAMPHET ER 20 MG PO CP24: 20.0000 mg | ORAL_CAPSULE | ORAL | 0 refills | Status: DC

## 2023-04-07 MED ORDER — HYDROXYZINE HCL 10 MG PO TABS: 10.0000 mg | ORAL_TABLET | Freq: Every evening | ORAL | 0 refills | Status: DC | PRN

## 2023-04-07 MED ORDER — DULOXETINE HCL 60 MG PO CPEP
60.0000 mg | ORAL_CAPSULE | Freq: Every day | ORAL | 1 refills | Status: DC
Start: 2023-04-07 — End: 2023-10-10

## 2023-04-07 MED ORDER — MONTELUKAST SODIUM 10 MG PO TABS
10.0000 mg | ORAL_TABLET | Freq: Every day | ORAL | 1 refills | Status: DC
Start: 2023-04-07 — End: 2023-10-10

## 2023-04-19 ENCOUNTER — Encounter: Payer: Self-pay | Admitting: Family Medicine

## 2023-04-20 ENCOUNTER — Other Ambulatory Visit: Payer: Self-pay | Admitting: Nurse Practitioner

## 2023-04-20 DIAGNOSIS — F902 Attention-deficit hyperactivity disorder, combined type: Secondary | ICD-10-CM

## 2023-04-20 MED ORDER — AMPHETAMINE-DEXTROAMPHETAMINE 20 MG PO TABS
20.0000 mg | ORAL_TABLET | Freq: Every day | ORAL | 0 refills | Status: DC
Start: 2023-04-20 — End: 2023-07-08

## 2023-04-20 MED ORDER — AMPHETAMINE-DEXTROAMPHET ER 20 MG PO CP24
20.0000 mg | ORAL_CAPSULE | ORAL | 0 refills | Status: DC
Start: 2023-04-20 — End: 2023-07-08

## 2023-04-20 NOTE — Telephone Encounter (Signed)
Copied from CRM [id number]. Topic: General - Other >> Apr 19, 2023  4:31 PM Turkey B wrote: Reason for CRM: pt called in checking status of if , the generic aderall in the 20  or 30 mg can be sent to the [facility] in [place].  Please cb

## 2023-06-30 NOTE — Progress Notes (Signed)
Name: Robin Chan   MRN: [id number]    DOB: 01-Jan-1971   Date:07/08/2023       Progress Note  Subjective  Chief Complaint  Chief Complaint  Patient presents with   Medical Management of Chronic Issues    HPI  Discussed the use of AI scribe software for clinical note transcription with the patient, who gave verbal consent to proceed.  History of Present Illness   The patient, with a history of multiple sclerosis (MS), major depression, anxiety, and a pituitary microadenoma, presents with concerns of recent symptoms suggestive of menopause. She reports experiencing a three-week period of emotional instability, including heightened depression and anxiety, along with physical symptoms such as back cramping and changes in bowel odor, reminiscent of her menstrual cycle. However, the patient has a history of hysterectomy for endometriosis and retains only one ovary  The patient also reports a recent episode of severe "zingers," a term she uses to describe a shooting pain sensation, predominantly on the left upper extremity This episode was so intense that it led to a crying episode during a phone call with her boss. The patient notes that these zingers are typically triggered by cold temperatures and has found relief by wearing long sleeves and avoiding wind exposure. This is part of her relapsing remitting MS symptoms   In addition to these concerns, the patient has been experiencing insomnia, which she manages with quetiapine and occasional use of hydroxyzine. She also takes Vyvanse or Adderall, depending on availability, for MS-related fatigue. ADHD and MDD    MDD is not under control despite taking medications, she is willing to see psychiatrist again since current regiment is no longer controlling symptoms and anxiety has increased.   The patient's mental health has been significantly impacted by ongoing relationship stress. She describes her partner's legal troubles and the resulting  emotional turmoil as contributing factors to her heightened anxiety and depression.  Lastly, the patient mentions a week-long history of a sore throat, which she has been managing with over-the-counter Tylenol. She denies any associated shortness of breath or wheezing, despite a history of asthma.         Patient Active Problem List   Diagnosis Date Noted   B12 deficiency 10/05/2022   Dyslipidemia 10/05/2022   Pre-diabetes 10/05/2022   Essential (hemorrhagic) thrombocythemia (HCC) 11/30/2021   Rectocele 07/08/2015   Status post laparoscopic assisted vaginal hysterectomy (LAVH) 07/08/2015   Left lumbar radiculitis 03/28/2015   ADD (attention deficit disorder) 12/31/2014   Allergic to bees 12/31/2014   Other insomnia 12/31/2014   Constipation 12/31/2014   Depression, major, recurrent, mild (HCC) 12/31/2014   Asthma, mild intermittent 12/31/2014   Migraine with aura and without status migrainosus, not intractable 12/31/2014   Multiple sclerosis, relapsing-remitting (HCC) 12/31/2014   Endometriosis determined by laparoscopy 12/31/2014   Perennial allergic rhinitis with seasonal variation 12/31/2014   Pituitary microadenoma (HCC) 12/31/2014   Vitamin D deficiency 12/31/2014   Acquired trigger finger 12/31/2014    Past Surgical History:  Procedure Laterality Date   ABDOMINAL HYSTERECTOMY  05/26/2015   BREAST BIOPSY Right 2005   benign   INCISIONAL HERNIA REPAIR N/A 10/09/2019   Procedure: HERNIA REPAIR INCISIONAL;  Surgeon: Henrene Dodge, MD;  Location: [facility];  Service: General;  Laterality: N/A;   INSERTION OF MESH N/A 10/09/2019   Procedure: INSERTION OF MESH;  Surgeon: Henrene Dodge, MD;  Location: [facility];  Service: General;  Laterality: N/A;   LAPAROSCOPIC ASSISTED VAGINAL HYSTERECTOMY N/A 05/26/2015  Procedure: LAPAROSCOPIC ASSISTED VAGINAL HYSTERECTOMY, with right salpingectomy;  Surgeon: Herold Harms, MD;  Location: [facility];  Service: Gynecology;  Laterality:  N/A;   LAPAROTOMY N/A 12/14/2018   Procedure: EXPLORATORY LAPAROTOMY;  Surgeon: Henrene Dodge, MD;  Location: [facility];  Service: General;  Laterality: N/A;   OVARY SURGERY Left 09/30/2014   RECTOCELE REPAIR N/A 05/26/2015   Procedure: POSTERIOR REPAIR (RECTOCELE);  Surgeon: Herold Harms, MD;  Location: [facility];  Service: Gynecology;  Laterality: N/A;    Family History  Problem Relation Age of Onset   Anemia Mother    Osteoporosis Mother    Mental illness Father        Bipolar   Arthritis Brother    Breast cancer Maternal Aunt     Social History   Tobacco Use   Smoking status: Some Days    Current packs/day: 0.25    Types: Cigarettes   Smokeless tobacco: Never   Tobacco comments:    She rolls her own cigarette   Substance Use Topics   Alcohol use: Yes    Alcohol/week: 14.0 standard drinks of alcohol    Types: 14 Shots of liquor per week    Comment: occasionally, 1-2 shots every night     Current Outpatient Medications:    albuterol (VENTOLIN HFA) 108 (90 Base) MCG/ACT inhaler, Inhale 1 puff into the lungs every 6 (six) hours as needed for wheezing or shortness of breath. Reported on 09/26/2015, Disp: 24 g, Rfl: 0   amphetamine-dextroamphetamine (ADDERALL XR) 20 MG 24 hr capsule, Take 1 capsule (20 mg total) by mouth every morning., Disp: 90 capsule, Rfl: 0   amphetamine-dextroamphetamine (ADDERALL) 20 MG tablet, Take 1 tablet (20 mg total) by mouth daily., Disp: 30 tablet, Rfl: 0   cetirizine (ZYRTEC) 10 MG tablet, Take 10 mg by mouth at bedtime. , Disp: , Rfl:    Cholecalciferol (VITAMIN D3) 50 MCG (2000 UT) TABS, Take 2,000 Units by mouth daily., Disp: , Rfl:    DULoxetine (CYMBALTA) 60 MG capsule, Take 1 capsule (60 mg total) by mouth daily., Disp: 90 capsule, Rfl: 1   EPINEPHrine 0.3 mg/0.3 mL IJ SOAJ injection, Inject 0.3 mg into the muscle as needed for anaphylaxis. , Disp: , Rfl:    hydrOXYzine (ATARAX) 10 MG tablet, Take 1-2 tablets (10-20 mg total) by mouth  at bedtime as needed., Disp: 60 tablet, Rfl: 0   levocetirizine (XYZAL) 5 MG tablet, TAKE 1 TABLET(5 MG) BY MOUTH DAILY, Disp: 90 tablet, Rfl: 1   montelukast (SINGULAIR) 10 MG tablet, Take 1 tablet (10 mg total) by mouth at bedtime., Disp: 90 tablet, Rfl: 1   QUEtiapine (SEROQUEL) 25 MG tablet, Take 1 tablet (25 mg total) by mouth at bedtime., Disp: 90 tablet, Rfl: 1  Allergies  Allergen Reactions   Penicillins Anaphylaxis    Did it involve swelling of the face/tongue/throat, SOB, or low BP? Yes Did it involve sudden or severe rash/hives, skin peeling, or any reaction on the inside of your mouth or nose? No Did you need to seek medical attention at a hospital or doctor's office? Yes When did it last happen? A long time ago If all above answers are "NO", may proceed with cephalosporin use.   Bee Venom     Bee    I personally reviewed active problem list, medication list, allergies, family history with the patient/caregiver today.   ROS  Ten systems reviewed and is negative except as mentioned in HPI    Objective  Vitals:  07/08/23 1135  BP: 122/80  Pulse: 100  Resp: 16  Temp: 98.1 F (36.7 C)  TempSrc: Oral  SpO2: 98%  Weight: 196 lb 12.8 oz (89.3 kg)  Height: 5\' 9"  (1.753 m)    Body mass index is 29.06 kg/m.  Physical Exam  Constitutional: Patient appears well-developed and well-nourished. Obese  No distress.  HEENT: head atraumatic, normocephalic, pupils equal and reactive to light, neck supple Cardiovascular: Normal rate, regular rhythm and normal heart sounds.  No murmur heard. No BLE edema. Pulmonary/Chest: Effort normal and breath sounds normal. No respiratory distress. Abdominal: Soft.  There is no tenderness. Psychiatric: Patient has a normal mood and affect. behavior is normal. Judgment and thought content normal.   PHQ2/9:    07/08/2023   11:33 AM 04/07/2023    9:24 AM 01/07/2023    9:33 AM 10/15/2022    8:51 AM 10/05/2022    9:47 AM  Depression  screen PHQ 2/9  Decreased Interest 1 1 0 1 1  Down, Depressed, Hopeless 1 1 0 1 1  PHQ - 2 Score 2 2 0 2 2  Altered sleeping 1 3 2  0 1  Tired, decreased energy 1 3 2 1 1   Change in appetite 1 0 0 0 1  Feeling bad or failure about yourself  1 0 0 0 0  Trouble concentrating 1 3 1  0 1  Moving slowly or fidgety/restless 0 0 0 0 1  Suicidal thoughts 1 0 0 0 0  PHQ-9 Score 8 11 5 3 7   Difficult doing work/chores Very difficult   Somewhat difficult Not difficult at all    phq 9 is positive   Fall Risk:    07/08/2023   11:27 AM 04/07/2023    9:22 AM 01/07/2023    9:33 AM 10/15/2022    8:49 AM 10/05/2022    9:36 AM  Fall Risk   Falls in the past year? 0 1 0 0 0  Number falls in past yr: 0 0 0 0   Injury with Fall? 0 0 0 0   Risk for fall due to : No Fall Risks No Fall Risks No Fall Risks  No Fall Risks  Follow up Falls prevention discussed;Education provided;Falls evaluation completed Falls prevention discussed Falls prevention discussed  Falls prevention discussed      07/08/2023   11:34 AM 10/05/2022    9:47 AM 06/09/2022    9:51 AM 05/29/2021    9:12 AM  GAD 7 : Generalized Anxiety Score  Nervous, Anxious, on Edge 1 2 0 2  Control/stop worrying 1 1 0 1  Worry too much - different things 1 3 1 2   Trouble relaxing 1 2 0 2  Restless 1 0 0 0  Easily annoyed or irritable 1 2 0 1  Afraid - awful might happen 1 1 0 3  Total GAD 7 Score 7 11 1 11   Anxiety Difficulty Very difficult Somewhat difficult Not difficult at all Somewhat difficult       Assessment & Plan  Assessment and Plan    Menopausal Symptoms Reports symptoms suggestive of menopause including mood changes, anxiety, and physical discomfort. No urinary symptoms or abnormal bleeding. -Consider FSH and LH bloodwork to confirm menopausal status. May need CT abdomen is pelvic pain returns  Pelvic Pain Reports history of pelvic pain, bloating, and cramping. No current symptoms. History of hysterectomy due to  endometriosis. -Consider CT pelvis is pain returns  Major Depression and Anxiety Reports increased symptoms of depression  and anxiety, including mood changes and panic attacks. Currently on duloxetine and quetiapine. -Continue current medications. -Referral  psychiatrist recommended. She would like to see the same provider as her daughter and will let me know if she needs a referral .  Multiple Sclerosis Reports increased symptoms including "zingers" (shooting pain) in left arm and side, possibly related to cold exposure. History of left foot drop, currently not present. -Continue current management strategies, including wearing long sleeves and avoiding cold exposure.  Insomnia Reports difficulty sleeping, currently managed with quetiapine and hydroxyzine as needed. -Continue current medications.  Prediabetes A1C 5.8. No symptoms of diabetes reported. -Continue low carbohydrate diet and regular monitoring of A1C.  Hyperlipidemia LDL increased from 114 to 454. -Continue diet low in saturated fats and cholesterol.  Pituitary Microadenoma Last prolactin level normal at 9.8. No current symptoms. -Continue regular monitoring.  Thrombocytosis Platelet count slightly elevated, but within patient's normal range. -Continue regular monitoring.  Vitamin B12 and Folate Deficiency B12 slightly low at 382. -Continue B12 supplementation three times a week.  Upper Respiratory Infection Reports sore throat for one week, no shortness of breath or impact on asthma. -Advise over-the-counter remedies, gargling with warm salt water, and saline nasal spray.  ADHD and MS Fatigue Reports benefit from Vyvanse, prefers it over Adderall due to less severe "crash". -Switch back to Vyvanse 40mg  daily from Adderall. -Check availability at pharmacy.

## 2023-07-08 ENCOUNTER — Encounter: Payer: Self-pay | Admitting: Family Medicine

## 2023-07-08 ENCOUNTER — Ambulatory Visit: Payer: No Typology Code available for payment source | Admitting: Family Medicine

## 2023-07-08 VITALS — BP 122/80 | HR 100 | Temp 98.1°F | Resp 16 | Ht 69.0 in | Wt 196.8 lb

## 2023-07-08 DIAGNOSIS — R102 Pelvic and perineal pain unspecified side: Secondary | ICD-10-CM

## 2023-07-08 DIAGNOSIS — F419 Anxiety disorder, unspecified: Secondary | ICD-10-CM

## 2023-07-08 DIAGNOSIS — R4586 Emotional lability: Secondary | ICD-10-CM

## 2023-07-08 DIAGNOSIS — F329 Major depressive disorder, single episode, unspecified: Secondary | ICD-10-CM | POA: Diagnosis not present

## 2023-07-08 DIAGNOSIS — R7303 Prediabetes: Secondary | ICD-10-CM

## 2023-07-08 DIAGNOSIS — E785 Hyperlipidemia, unspecified: Secondary | ICD-10-CM | POA: Diagnosis not present

## 2023-07-08 DIAGNOSIS — D352 Benign neoplasm of pituitary gland: Secondary | ICD-10-CM

## 2023-07-08 DIAGNOSIS — F902 Attention-deficit hyperactivity disorder, combined type: Secondary | ICD-10-CM

## 2023-07-08 DIAGNOSIS — J069 Acute upper respiratory infection, unspecified: Secondary | ICD-10-CM

## 2023-07-08 DIAGNOSIS — G35 Multiple sclerosis: Secondary | ICD-10-CM | POA: Diagnosis not present

## 2023-07-08 DIAGNOSIS — Z90710 Acquired absence of both cervix and uterus: Secondary | ICD-10-CM

## 2023-07-08 DIAGNOSIS — F330 Major depressive disorder, recurrent, mild: Secondary | ICD-10-CM

## 2023-07-08 DIAGNOSIS — J4520 Mild intermittent asthma, uncomplicated: Secondary | ICD-10-CM

## 2023-07-08 DIAGNOSIS — R232 Flushing: Secondary | ICD-10-CM

## 2023-07-08 DIAGNOSIS — G35A Relapsing-remitting multiple sclerosis: Secondary | ICD-10-CM

## 2023-07-08 MED ORDER — LISDEXAMFETAMINE DIMESYLATE 40 MG PO CAPS
40.0000 mg | ORAL_CAPSULE | ORAL | 0 refills | Status: DC
Start: 2023-07-08 — End: 2023-10-14

## 2023-07-14 ENCOUNTER — Ambulatory Visit: Payer: No Typology Code available for payment source

## 2023-10-08 ENCOUNTER — Other Ambulatory Visit: Payer: Self-pay | Admitting: Family Medicine

## 2023-10-08 DIAGNOSIS — J4520 Mild intermittent asthma, uncomplicated: Secondary | ICD-10-CM

## 2023-10-08 DIAGNOSIS — F330 Major depressive disorder, recurrent, mild: Secondary | ICD-10-CM

## 2023-10-14 ENCOUNTER — Ambulatory Visit: Payer: Self-pay | Admitting: Family Medicine

## 2023-10-14 ENCOUNTER — Encounter: Payer: Self-pay | Admitting: Family Medicine

## 2023-10-14 VITALS — BP 118/72 | HR 98 | Resp 16 | Ht 69.0 in | Wt 198.0 lb

## 2023-10-14 DIAGNOSIS — G35 Multiple sclerosis: Secondary | ICD-10-CM | POA: Diagnosis not present

## 2023-10-14 DIAGNOSIS — G4709 Other insomnia: Secondary | ICD-10-CM

## 2023-10-14 DIAGNOSIS — F330 Major depressive disorder, recurrent, mild: Secondary | ICD-10-CM

## 2023-10-14 DIAGNOSIS — R7303 Prediabetes: Secondary | ICD-10-CM | POA: Diagnosis not present

## 2023-10-14 DIAGNOSIS — D352 Benign neoplasm of pituitary gland: Secondary | ICD-10-CM

## 2023-10-14 DIAGNOSIS — F902 Attention-deficit hyperactivity disorder, combined type: Secondary | ICD-10-CM | POA: Diagnosis not present

## 2023-10-14 DIAGNOSIS — J4520 Mild intermittent asthma, uncomplicated: Secondary | ICD-10-CM | POA: Diagnosis not present

## 2023-10-14 DIAGNOSIS — E785 Hyperlipidemia, unspecified: Secondary | ICD-10-CM

## 2023-10-14 DIAGNOSIS — D473 Essential (hemorrhagic) thrombocythemia: Secondary | ICD-10-CM

## 2023-10-14 DIAGNOSIS — E538 Deficiency of other specified B group vitamins: Secondary | ICD-10-CM

## 2023-10-14 DIAGNOSIS — E559 Vitamin D deficiency, unspecified: Secondary | ICD-10-CM

## 2023-10-14 MED ORDER — AIRSUPRA 90-80 MCG/ACT IN AERO: 2.0000 | INHALATION_SPRAY | Freq: Four times a day (QID) | RESPIRATORY_TRACT | 0 refills | Status: AC | PRN

## 2023-10-14 MED ORDER — LISDEXAMFETAMINE DIMESYLATE 40 MG PO CAPS
40.0000 mg | ORAL_CAPSULE | ORAL | 0 refills | Status: DC
Start: 2023-10-14 — End: 2024-01-16

## 2023-10-14 MED ORDER — DULOXETINE HCL 60 MG PO CPEP
60.0000 mg | ORAL_CAPSULE | Freq: Every day | ORAL | 1 refills | Status: DC
Start: 2023-10-14 — End: 2024-04-18

## 2023-10-14 MED ORDER — QUETIAPINE FUMARATE 25 MG PO TABS: 25.0000 mg | ORAL_TABLET | Freq: Every day | ORAL | 1 refills | Status: DC

## 2023-10-14 NOTE — Progress Notes (Signed)
 Name: Robin Chan   MRN: [id number]    DOB: May 26, 1971   Date:10/14/2023       Progress Note  Subjective  Chief Complaint  Chief Complaint  Patient presents with   Medical Management of Chronic Issues   HPI   ADD:  She used to take Adderal but was not lasting all day, she has been on  Vyvanse 40 mg for years and is doing well. She is aware it is a controlled substance and Controlled substance data base reviewed today    MS: diagnosed in 2000, but first symptoms in 1991 - she had numbness  on the right side and weakness.  Currently off medication because of multiple side effects. Has intermittent weakness and has used a cane periodically, for left foot drop, she has noticed some shooting pain like a zap on left upper extremity - happens at night - intermittently .  She is currently working full time [employer]. Last MRI done 12/2019 showed that plaques were stable. Discussed sending her back to neurologist or repeating MRI but she cannot afford it at this time. She states due to rapid temperature changes her symptoms have been worse lately, with left leg weakness and having to take breaks at work to seat down    Insomnia: she has been off Ambien, we stopped it on the Spring 2018.  She is taking Seroquel low dose qhs and used to work well, but has noticed that sometimes she cannot sleep she takes an hydroxyzine but still has medication at home    Major Depression: she was  taking Lexapro for many years and depression was not controlled, we switched to Cymbalta in June 2017, She has been divorced since Fall 2020. She stopped seeing psychiatrist [facility] She is currently doing well on Duloxetine 60 mg and stable at this time. and phq 9 is elevated but better than last visit, she is still taking Seroquel 25 mg at night .    Asthma: she is taking singulair daily now , she denies wheezing, cough or SOB at this time. She only uses albuterol very seldom , we will switch to Airsuppra     AR: worse this time of the year, taking Xyzal and using nasal spray plus montelukast    Pre-diabetes: denies polyphagia, polydipsia or polyuria, Last A1C was up again at 5.8 % , we will recheck labs today   Pituitary microadenoma: no nipple discharge or double vision, prolactin was normal March 2024. No headaches. We will recheck TSH and prolactin     IMPRESSION: last MRI 2021  1. Unchanged distribution of white matter lesions consistent with multiple sclerosis. No active demyelinating lesions. 2. 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    Patient Active Problem List   Diagnosis Date Noted   B12 deficiency 10/05/2022   Dyslipidemia 10/05/2022   Pre-diabetes 10/05/2022   Rectocele 07/08/2015   Status post laparoscopic assisted vaginal hysterectomy (LAVH) 07/08/2015   Left lumbar radiculitis 03/28/2015   ADD (attention deficit disorder) 12/31/2014   Allergic to bees 12/31/2014   Other insomnia 12/31/2014   Constipation 12/31/2014   Depression, major, recurrent, mild (HCC) 12/31/2014   Asthma, mild intermittent 12/31/2014   Migraine with aura and without status migrainosus, not intractable 12/31/2014   Multiple sclerosis, relapsing-remitting (HCC) 12/31/2014   Endometriosis determined by laparoscopy 12/31/2014   Perennial allergic rhinitis with seasonal variation 12/31/2014   Pituitary microadenoma (HCC) 12/31/2014  Vitamin D deficiency 12/31/2014   Acquired trigger finger 12/31/2014    Past Surgical History:  Procedure Laterality Date   ABDOMINAL HYSTERECTOMY  05/26/2015   BREAST BIOPSY Right 2005   benign   INCISIONAL HERNIA REPAIR N/A 10/09/2019   Procedure: HERNIA REPAIR INCISIONAL;  Surgeon: Henrene Dodge, MD;  Location: [facility] ORS;  Service: General;  Laterality: N/A;   INSERTION OF MESH N/A 10/09/2019   Procedure: INSERTION OF MESH;  Surgeon: Henrene Dodge, MD;  Location: [facility];  Service:  General;  Laterality: N/A;   LAPAROSCOPIC ASSISTED VAGINAL HYSTERECTOMY N/A 05/26/2015   Procedure: LAPAROSCOPIC ASSISTED VAGINAL HYSTERECTOMY, with right salpingectomy;  Surgeon: Herold Harms, MD;  Location: [facility];  Service: Gynecology;  Laterality: N/A;   LAPAROTOMY N/A 12/14/2018   Procedure: EXPLORATORY LAPAROTOMY;  Surgeon: Henrene Dodge, MD;  Location: [facility];  Service: General;  Laterality: N/A;   OVARY SURGERY Left 09/30/2014   RECTOCELE REPAIR N/A 05/26/2015   Procedure: POSTERIOR REPAIR (RECTOCELE);  Surgeon: Herold Harms, MD;  Location: [facility];  Service: Gynecology;  Laterality: N/A;    Family History  Problem Relation Age of Onset   Anemia Mother    Osteoporosis Mother    Mental illness Father        Bipolar   Arthritis Brother    Breast cancer Maternal Aunt     Social History   Tobacco Use   Smoking status: Some Days    Current packs/day: 0.25    Types: Cigarettes   Smokeless tobacco: Never   Tobacco comments:    She rolls her own cigarette   Substance Use Topics   Alcohol use: Yes    Alcohol/week: 14.0 standard drinks of alcohol    Types: 14 Shots of liquor per week    Comment: occasionally, 1-2 shots every night     Current Outpatient Medications:    Albuterol-Budesonide (AIRSUPRA) 90-80 MCG/ACT AERO, Inhale 2 puffs into the lungs 4 (four) times daily as needed., Disp: 32.1 g, Rfl: 0   Cholecalciferol (VITAMIN D3) 50 MCG (2000 UT) TABS, Take 2,000 Units by mouth daily., Disp: , Rfl:    EPINEPHrine 0.3 mg/0.3 mL IJ SOAJ injection, Inject 0.3 mg into the muscle as needed for anaphylaxis. , Disp: , Rfl:    hydrOXYzine (ATARAX) 10 MG tablet, Take 1-2 tablets (10-20 mg total) by mouth at bedtime as needed., Disp: 60 tablet, Rfl: 0   levocetirizine (XYZAL) 5 MG tablet, TAKE 1 TABLET(5 MG) BY MOUTH DAILY, Disp: 90 tablet, Rfl: 1   montelukast (SINGULAIR) 10 MG tablet, TAKE 1 TABLET AT BEDTIME, Disp: 30 tablet, Rfl: 0   DULoxetine (CYMBALTA) 60  MG capsule, Take 1 capsule (60 mg total) by mouth daily., Disp: 90 capsule, Rfl: 1   lisdexamfetamine (VYVANSE) 40 MG capsule, Take 1 capsule (40 mg total) by mouth every morning., Disp: 90 capsule, Rfl: 0   QUEtiapine (SEROQUEL) 25 MG tablet, Take 1 tablet (25 mg total) by mouth at bedtime., Disp: 90 tablet, Rfl: 1  Allergies  Allergen Reactions   Penicillins Anaphylaxis    Did it involve swelling of the face/tongue/throat, SOB, or low BP? Yes Did it involve sudden or severe rash/hives, skin peeling, or any reaction on the inside of your mouth or nose? No Did you need to seek medical attention at a hospital or doctor's office? Yes When did it last happen? A long time ago If all above answers are "NO", may proceed with cephalosporin use.   Bee Venom  Bee    I personally reviewed active problem list, medication list, allergies with the patient/caregiver today.   ROS  Ten systems reviewed and is negative except as mentioned in HPI    Objective  Vitals:   10/14/23 0909  BP: 118/72  Pulse: 98  Resp: 16  SpO2: 100%  Weight: 198 lb (89.8 kg)  Height: 5\' 9"  (1.753 m)    Body mass index is 29.24 kg/m.  Physical Exam  Constitutional: Patient appears well-developed and well-nourished.  No distress.  HEENT: head atraumatic, normocephalic, pupils equal and reactive to light, neck supple Cardiovascular: Normal rate, regular rhythm and normal heart sounds.  No murmur heard. No BLE edema. Pulmonary/Chest: Effort normal and breath sounds normal. No respiratory distress. Abdominal: Soft.  There is no tenderness. Neuro: normal gait  hypersensitive on left lower leg  Psychiatric: Patient has a normal mood and affect. behavior is normal. Judgment and thought content normal.   No results found for this or any previous visit (from the past 2160 hours).  Diabetic Foot Exam:     PHQ2/9:    10/14/2023    9:09 AM 07/08/2023   11:33 AM 04/07/2023    9:24 AM 01/07/2023    9:33 AM  10/15/2022    8:51 AM  Depression screen PHQ 2/9  Decreased Interest 1 1 1  0 1  Down, Depressed, Hopeless 1 1 1  0 1  PHQ - 2 Score 2 2 2  0 2  Altered sleeping 1 1 3 2  0  Tired, decreased energy 1 1 3 2 1   Change in appetite 1 1 0 0 0  Feeling bad or failure about yourself  0 1 0 0 0  Trouble concentrating 1 1 3 1  0  Moving slowly or fidgety/restless 0 0 0 0 0  Suicidal thoughts 0 1 0 0 0  PHQ-9 Score 6 8 11 5 3   Difficult doing work/chores Somewhat difficult Very difficult   Somewhat difficult    phq 9 is positive  Fall Risk:    07/08/2023   11:27 AM 04/07/2023    9:22 AM 01/07/2023    9:33 AM 10/15/2022    8:49 AM 10/05/2022    9:36 AM  Fall Risk   Falls in the past year? 0 1 0 0 0  Number falls in past yr: 0 0 0 0   Injury with Fall? 0 0 0 0   Risk for fall due to : No Fall Risks No Fall Risks No Fall Risks  No Fall Risks  Follow up Falls prevention discussed;Education provided;Falls evaluation completed Falls prevention discussed Falls prevention discussed  Falls prevention discussed     Assessment & Plan  1. Multiple sclerosis, relapsing-remitting (HCC) (Primary)  - COMPLETE METABOLIC PANEL WITH GFR - lisdexamfetamine (VYVANSE) 40 MG capsule; Take 1 capsule (40 mg total) by mouth every morning.  Dispense: 90 capsule; Refill: 0  2. Essential (hemorrhagic) thrombocythemia (HCC)  - CBC with Differential/Platelet  3. Pituitary microadenoma (HCC)  - Prolactin - TSH  4. Depression, major, recurrent, mild (HCC)  - DULoxetine (CYMBALTA) 60 MG capsule; Take 1 capsule (60 mg total) by mouth daily.  Dispense: 90 capsule; Refill: 1 - lisdexamfetamine (VYVANSE) 40 MG capsule; Take 1 capsule (40 mg total) by mouth every morning.  Dispense: 90 capsule; Refill: 0  5. Dyslipidemia  - Lipid panel  6. Mild intermittent asthma without complication  Airsupra sent to pharmacy   7. Attention deficit hyperactivity disorder (ADHD), combined type  - lisdexamfetamine (VYVANSE)  40 MG capsule; Take 1 capsule (40 mg total) by mouth every morning.  Dispense: 90 capsule; Refill: 0  8. Pre-diabetes  - Hemoglobin A1c  9. Vitamin D deficiency  Continue supplementation   10. B12 deficiency  - B12 and Folate Panel - CBC with Differential/Platelet  11. Other insomnia  - QUEtiapine (SEROQUEL) 25 MG tablet; Take 1 tablet (25 mg total) by mouth at bedtime.  Dispense: 90 tablet; Refill: 1

## 2023-10-15 LAB — CBC WITH DIFFERENTIAL/PLATELET
Absolute Lymphocytes: 2935 {cells}/uL (ref 850–3900)
Absolute Monocytes: 503 {cells}/uL (ref 200–950)
Basophils Absolute: 60 {cells}/uL (ref 0–200)
Basophils Relative: 0.9 %
Eosinophils Absolute: 228 {cells}/uL (ref 15–500)
Eosinophils Relative: 3.4 %
HCT: 37.5 % (ref 35.0–45.0)
Hemoglobin: 12.4 g/dL (ref 11.7–15.5)
MCH: 30.5 pg (ref 27.0–33.0)
MCHC: 33.1 g/dL (ref 32.0–36.0)
MCV: 92.4 fL (ref 80.0–100.0)
MPV: 9.8 fL (ref 7.5–12.5)
Monocytes Relative: 7.5 %
Neutro Abs: 2975 {cells}/uL (ref 1500–7800)
Neutrophils Relative %: 44.4 %
Platelets: 396 10*3/uL (ref 140–400)
RBC: 4.06 10*6/uL (ref 3.80–5.10)
RDW: 11.7 % (ref 11.0–15.0)
Total Lymphocyte: 43.8 %
WBC: 6.7 10*3/uL (ref 3.8–10.8)

## 2023-10-15 LAB — COMPLETE METABOLIC PANEL WITH GFR
AG Ratio: 1.6 (calc) (ref 1.0–2.5)
ALT: 12 U/L (ref 6–29)
AST: 14 U/L (ref 10–35)
Albumin: 4.2 g/dL (ref 3.6–5.1)
Alkaline phosphatase (APISO): 65 U/L (ref 37–153)
BUN: 19 mg/dL (ref 7–25)
CO2: 30 mmol/L (ref 20–32)
Calcium: 9.2 mg/dL (ref 8.6–10.4)
Chloride: 104 mmol/L (ref 98–110)
Creat: 0.91 mg/dL (ref 0.50–1.03)
Globulin: 2.6 g/dL (ref 1.9–3.7)
Glucose, Bld: 91 mg/dL (ref 65–99)
Potassium: 5.4 mmol/L — ABNORMAL HIGH (ref 3.5–5.3)
Sodium: 140 mmol/L (ref 135–146)
Total Bilirubin: 0.5 mg/dL (ref 0.2–1.2)
Total Protein: 6.8 g/dL (ref 6.1–8.1)

## 2023-10-15 LAB — LIPID PANEL
Cholesterol: 192 mg/dL (ref <200)
HDL: 56 mg/dL (ref >=50)
LDL Cholesterol (Calc): 115 mg/dL — ABNORMAL HIGH
Non-HDL Cholesterol (Calc): 136 mg/dL — ABNORMAL HIGH (ref <130)
Total CHOL/HDL Ratio: 3.4 (calc) (ref <5.0)
Triglycerides: 99 mg/dL (ref <150)

## 2023-10-15 LAB — PROLACTIN: Prolactin: 9.3 ng/mL

## 2023-10-15 LAB — HEMOGLOBIN A1C
Hgb A1c MFr Bld: 5.9 %{Hb} — ABNORMAL HIGH (ref <5.7)
Mean Plasma Glucose: 123 mg/dL
eAG (mmol/L): 6.8 mmol/L

## 2023-10-15 LAB — TSH: TSH: 1.59 m[IU]/L

## 2023-10-15 LAB — B12 AND FOLATE PANEL
Folate: 17.5 ng/mL
Vitamin B-12: 473 pg/mL (ref 200–1100)

## 2023-10-17 ENCOUNTER — Encounter: Payer: Self-pay | Admitting: Family Medicine

## 2023-10-18 ENCOUNTER — Encounter: Payer: Self-pay | Admitting: Family Medicine

## 2023-10-18 ENCOUNTER — Ambulatory Visit (INDEPENDENT_AMBULATORY_CARE_PROVIDER_SITE_OTHER): Payer: BC Managed Care – PPO | Admitting: Family Medicine

## 2023-10-18 VITALS — BP 110/76 | HR 91 | Resp 16 | Ht 69.0 in | Wt 198.1 lb

## 2023-10-18 DIAGNOSIS — Z1231 Encounter for screening mammogram for malignant neoplasm of breast: Secondary | ICD-10-CM

## 2023-10-18 DIAGNOSIS — Z0000 Encounter for general adult medical examination without abnormal findings: Secondary | ICD-10-CM | POA: Diagnosis not present

## 2023-10-18 NOTE — Progress Notes (Signed)
 Name: Robin Chan   MRN: [id number]    DOB: [birth year]   Date:10/18/2023       Progress Note  Subjective  Chief Complaint  Chief Complaint  Patient presents with   Annual Exam    HPI  Patient presents for annual CPE.  Diet: discussed low carbohydrate diet, needs to eat dinner earlier when possible Exercise:  discussed regular physical activity  Last Eye Exam: completed Last Dental Exam: completed  Flowsheet Row Office Visit from 10/18/2023 in [facility]  AUDIT-C Score 2      Depression: Phq 9 is  positive    10/14/2023    9:09 AM 07/08/2023   11:33 AM 04/07/2023    9:24 AM 01/07/2023    9:33 AM 10/15/2022    8:51 AM  Depression screen PHQ 2/9  Decreased Interest 1 1 1  0 1  Down, Depressed, Hopeless 1 1 1  0 1  PHQ - 2 Score 2 2 2  0 2  Altered sleeping 1 1 3 2  0  Tired, decreased energy 1 1 3 2 1   Change in appetite 1 1 0 0 0  Feeling bad or failure about yourself  0 1 0 0 0  Trouble concentrating 1 1 3 1  0  Moving slowly or fidgety/restless 0 0 0 0 0  Suicidal thoughts 0 1 0 0 0  PHQ-9 Score 6 8 11 5 3   Difficult doing work/chores Somewhat difficult Very difficult   Somewhat difficult   Hypertension: BP Readings from Last 3 Encounters:  10/18/23 110/76  10/14/23 118/72  07/08/23 122/80   Obesity: Wt Readings from Last 3 Encounters:  10/18/23 198 lb 1.6 oz (89.9 kg)  10/14/23 198 lb (89.8 kg)  07/08/23 196 lb 12.8 oz (89.3 kg)   BMI Readings from Last 3 Encounters:  10/18/23 29.25 kg/m  10/14/23 29.24 kg/m  07/08/23 29.06 kg/m     Vaccines: reviewed with the patient.   Hep C Screening: completed STD testing and prevention (HIV/chl/gon/syphilis): not interested  Intimate partner violence: negative screen  Sexual History : one partner for the past 5 years  Menstrual History/LMP/Abnormal Bleeding: s/p hysterectomy  Discussed importance of follow up if any post-menopausal bleeding: not applicable  Incontinence Symptoms:  mild symptoms , when bladder is full and has to sneeze  Breast cancer:  - Last Mammogram: needs to schedule it  - BRCA gene screening: N/A  Osteoporosis Prevention : Discussed high calcium and vitamin D supplementation, weight bearing exercises Bone density :not applicable   Cervical cancer screening: not applicable due to hysterectomy  Skin cancer: Discussed monitoring for atypical lesions  Colorectal cancer: up to date with cologuard    Lung cancer:  Low Dose CT Chest recommended if Age 3-80 years, 20 pack-year currently smoking OR have quit w/in 15years. Patient does not qualify for screen   ECG: 2020  Advanced Care Planning: A voluntary discussion about advance care planning including the explanation and discussion of advance directives.  Discussed health care proxy and Living will, and the patient was able to identify a health care proxy as Sharia Reeve - her son .  Patient does not have a living will and power of attorney of health care   Patient Active Problem List   Diagnosis Date Noted   B12 deficiency 10/05/2022   Dyslipidemia 10/05/2022   Pre-diabetes 10/05/2022   Rectocele 07/08/2015   Status post laparoscopic assisted vaginal hysterectomy (LAVH) 07/08/2015   Left lumbar radiculitis 03/28/2015   ADD (attention deficit disorder)  12/31/2014   Allergic to bees 12/31/2014   Other insomnia 12/31/2014   Constipation 12/31/2014   Depression, major, recurrent, mild (HCC) 12/31/2014   Asthma, mild intermittent 12/31/2014   Migraine with aura and without status migrainosus, not intractable 12/31/2014   Multiple sclerosis, relapsing-remitting (HCC) 12/31/2014   Endometriosis determined by laparoscopy 12/31/2014   Perennial allergic rhinitis with seasonal variation 12/31/2014   Pituitary microadenoma (HCC) 12/31/2014   Vitamin D deficiency 12/31/2014   Acquired trigger finger 12/31/2014    Past Surgical History:  Procedure Laterality Date   ABDOMINAL HYSTERECTOMY  05/26/2015    BREAST BIOPSY Right 2005   benign   COLON SURGERY     HERNIA REPAIR     INCISIONAL HERNIA REPAIR N/A 10/09/2019   Procedure: HERNIA REPAIR INCISIONAL;  Surgeon: Henrene Dodge, MD;  Location: [facility];  Service: General;  Laterality: N/A;   INSERTION OF MESH N/A 10/09/2019   Procedure: INSERTION OF MESH;  Surgeon: Henrene Dodge, MD;  Location: [facility];  Service: General;  Laterality: N/A;   LAPAROSCOPIC ASSISTED VAGINAL HYSTERECTOMY N/A 05/26/2015   Procedure: LAPAROSCOPIC ASSISTED VAGINAL HYSTERECTOMY, with right salpingectomy;  Surgeon: Herold Harms, MD;  Location: [facility];  Service: Gynecology;  Laterality: N/A;   LAPAROTOMY N/A 12/14/2018   Procedure: EXPLORATORY LAPAROTOMY;  Surgeon: Henrene Dodge, MD;  Location: [facility];  Service: General;  Laterality: N/A;   OVARY SURGERY Left 09/30/2014   RECTOCELE REPAIR N/A 05/26/2015   Procedure: POSTERIOR REPAIR (RECTOCELE);  Surgeon: Herold Harms, MD;  Location: [facility];  Service: Gynecology;  Laterality: N/A;    Family History  Problem Relation Age of Onset   Anemia Mother    Osteoporosis Mother    Mental illness Father        Bipolar   Depression Father    Arthritis Brother    Breast cancer Maternal Aunt    ADD / ADHD Son    ADD / ADHD Daughter     Social History   Socioeconomic History   Marital status: Significant Other    Spouse name: Not on file   Number of children: 2   Years of education: Not on file   Highest education level: Associate degree: academic program  Occupational History   Occupation: Designer, industrial/product     Comment: [employer]   Tobacco Use   Smoking status: Some Days    Current packs/day: 0.50    Average packs/day: 0.4 packs/day for 18.2 years (6.9 ttl pk-yrs)    Types: Cigarettes    Start date: 42    Last attempt to quit: 1995   Smokeless tobacco: Never   Tobacco comments:    She rolls her own cigarette   Vaping Use   Vaping status: Former  Substance and Sexual Activity   Alcohol  use: Yes    Alcohol/week: 3.0 standard drinks of alcohol    Types: 3 Glasses of wine per week    Comment: occasionally, 1-2 shots every night   Drug use: No   Sexual activity: Yes    Partners: Male    Birth control/protection: None    Comment: separated   Other Topics Concern   Not on file  Social History Narrative   She is from [place].    Moved to [place] in 1998 because of husband's job transfer, that is when she stopped working -pending her green-card.   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    She had symptoms of MS in 2000 , daughter was  83 year old and just now found a job at [employer] March 2019   Separated since July 2019       Social Drivers of Health   Financial Resource Strain: Low Risk  (10/18/2023)   Overall Financial Resource Strain (CARDIA)    Difficulty of Paying Living Expenses: Not hard at all  Food Insecurity: No Food Insecurity (10/18/2023)   Hunger Vital Sign    Worried About Running Out of Food in the Last Year: Never true    Ran Out of Food in the Last Year: Never true  Transportation Needs: No Transportation Needs (10/18/2023)   PRAPARE - Administrator, Civil Service (Medical): No    Lack of Transportation (Non-Medical): No  Physical Activity: Inactive (10/18/2023)   Exercise Vital Sign    Days of Exercise per Week: 0 days    Minutes of Exercise per Session: 0 min  Stress: Stress Concern Present (10/18/2023)   [employer] of Occupational Health - Occupational Stress Questionnaire    Feeling of Stress : Rather much  Social Connections: Socially Isolated (10/18/2023)   Social Connection and Isolation Panel [NHANES]    Frequency of Communication with Friends and Family: Three times a week    Frequency of Social Gatherings with Friends and Family: Three times a week    Attends Religious Services: Never    Active Member of Clubs or Organizations: No    Attends Banker Meetings: Never    Marital Status: Divorced  Catering manager  Violence: Not At Risk (10/18/2023)   Humiliation, Afraid, Rape, and Kick questionnaire    Fear of Current or Ex-Partner: No    Emotionally Abused: No    Physically Abused: No    Sexually Abused: No     Current Outpatient Medications:    Albuterol-Budesonide (AIRSUPRA) 90-80 MCG/ACT AERO, Inhale 2 puffs into the lungs 4 (four) times daily as needed., Disp: 32.1 g, Rfl: 0   Cholecalciferol (VITAMIN D3) 50 MCG (2000 UT) TABS, Take 2,000 Units by mouth daily., Disp: , Rfl:    DULoxetine (CYMBALTA) 60 MG capsule, Take 1 capsule (60 mg total) by mouth daily., Disp: 90 capsule, Rfl: 1   EPINEPHrine 0.3 mg/0.3 mL IJ SOAJ injection, Inject 0.3 mg into the muscle as needed for anaphylaxis. , Disp: , Rfl:    hydrOXYzine (ATARAX) 10 MG tablet, Take 1-2 tablets (10-20 mg total) by mouth at bedtime as needed., Disp: 60 tablet, Rfl: 0   levocetirizine (XYZAL) 5 MG tablet, TAKE 1 TABLET(5 MG) BY MOUTH DAILY, Disp: 90 tablet, Rfl: 1   lisdexamfetamine (VYVANSE) 40 MG capsule, Take 1 capsule (40 mg total) by mouth every morning., Disp: 90 capsule, Rfl: 0   montelukast (SINGULAIR) 10 MG tablet, TAKE 1 TABLET AT BEDTIME, Disp: 30 tablet, Rfl: 0   QUEtiapine (SEROQUEL) 25 MG tablet, Take 1 tablet (25 mg total) by mouth at bedtime., Disp: 90 tablet, Rfl: 1  Allergies  Allergen Reactions   Penicillins Anaphylaxis    Did it involve swelling of the face/tongue/throat, SOB, or low BP? Yes Did it involve sudden or severe rash/hives, skin peeling, or any reaction on the inside of your mouth or nose? No Did you need to seek medical attention at a hospital or doctor's office? Yes When did it last happen? A long time ago If all above answers are "NO", may proceed with cephalosporin use.   Bee Venom     Bee     ROS  Ten systems reviewed and is  negative except as mentioned in HPI    Objective  Vitals:   10/18/23 0850  BP: 110/76  Pulse: 91  Resp: 16  SpO2: 98%  Weight: 198 lb 1.6 oz (89.9 kg)  Height: 5'  9" (1.753 m)    Body mass index is 29.25 kg/m.  Physical Exam  Constitutional: Patient appears well-developed and well-nourished. No distress.  HENT: Head: Normocephalic and atraumatic. Ears: B TMs ok, no erythema or effusion; Nose: Nose normal. Mouth/Throat: Oropharynx is clear and moist. No oropharyngeal exudate.  Eyes: Conjunctivae and EOM are normal. Pupils are equal, round, and reactive to light. No scleral icterus.  Neck: Normal range of motion. Neck supple. No JVD present. No thyromegaly present.  Cardiovascular: Normal rate, regular rhythm and normal heart sounds.  No murmur heard. No BLE edema. Pulmonary/Chest: Effort normal and breath sounds normal. No respiratory distress. Abdominal: Soft. Bowel sounds are normal, no distension. There is no tenderness. no masses FEMALE GENITALIA: not done  RECTAL: not done  Musculoskeletal: Normal range of motion, no joint effusions. No gross deformities Neurological: he is alert and oriented to person, place, and time. No cranial nerve deficit. Coordination, balance, strength, speech and gait are normal.  Skin: Skin is warm and dry. No rash noted. No erythema. SK on back, skin tags neck Psychiatric: Patient has a normal mood and affect. behavior is normal. Judgment and thought content normal.      Assessment & Plan  1. Well adult exam (Primary)  2. Encounter for screening mammogram for malignant neoplasm of breast  - MM 3D SCREENING MAMMOGRAM BILATERAL BREAST; Future  -USPSTF grade A and B recommendations reviewed with patient; age-appropriate recommendations, preventive care, screening tests, etc discussed and encouraged; healthy living encouraged; see AVS for patient education given to patient -Discussed importance of 150 minutes of physical activity weekly, eat two servings of fish weekly, eat one serving of tree nuts ( cashews, pistachios, pecans, almonds.Marland Kitchen) every other day, eat 6 servings of fruit/vegetables daily and drink plenty of  water and avoid sweet beverages.   -Reviewed Health Maintenance: Yes.

## 2023-10-18 NOTE — Patient Instructions (Signed)
 Preventive Care 16-52 Years Old, Female  Preventive care refers to lifestyle choices and visits with your health care provider that can promote health and wellness. Preventive care visits are also called wellness exams.  What can I expect for my preventive care visit?  Counseling  Your health care provider may ask you questions about your:  Medical history, including:  Past medical problems.  Family medical history.  Pregnancy history.  Current health, including:  Menstrual cycle.  Method of birth control.  Emotional well-being.  Home life and relationship well-being.  Sexual activity and sexual health.  Lifestyle, including:  Alcohol, nicotine or tobacco, and drug use.  Access to firearms.  Diet, exercise, and sleep habits.  Work and work Astronomer.  Sunscreen use.  Safety issues such as seatbelt and bike helmet use.  Physical exam  Your health care provider will check your:  Height and weight. These may be used to calculate your BMI (body mass index). BMI is a measurement that tells if you are at a healthy weight.  Waist circumference. This measures the distance around your waistline. This measurement also tells if you are at a healthy weight and may help predict your risk of certain diseases, such as type 2 diabetes and high blood pressure.  Heart rate and blood pressure.  Body temperature.  Skin for abnormal spots.  What immunizations do I need?    Vaccines are usually given at various ages, according to a schedule. Your health care provider will recommend vaccines for you based on your age, medical history, and lifestyle or other factors, such as travel or where you work.  What tests do I need?  Screening  Your health care provider may recommend screening tests for certain conditions. This may include:  Lipid and cholesterol levels.  Diabetes screening. This is done by checking your blood sugar (glucose) after you have not eaten for a while (fasting).  Pelvic exam and Pap test.  Hepatitis B test.  Hepatitis C  test.  HIV (human immunodeficiency virus) test.  STI (sexually transmitted infection) testing, if you are at risk.  Lung cancer screening.  Colorectal cancer screening.  Mammogram. Talk with your health care provider about when you should start having regular mammograms. This may depend on whether you have a family history of breast cancer.  BRCA-related cancer screening. This may be done if you have a family history of breast, ovarian, tubal, or peritoneal cancers.  Bone density scan. This is done to screen for osteoporosis.  Talk with your health care provider about your test results, treatment options, and if necessary, the need for more tests.  Follow these instructions at home:  Eating and drinking    Eat a diet that includes fresh fruits and vegetables, whole grains, lean protein, and low-fat dairy products.  Take vitamin and mineral supplements as recommended by your health care provider.  Do not drink alcohol if:  Your health care provider tells you not to drink.  You are pregnant, may be pregnant, or are planning to become pregnant.  If you drink alcohol:  Limit how much you have to 0-1 drink a day.  Know how much alcohol is in your drink. In the [place] one drink equals one 12 oz bottle of beer (355 mL), one 5 oz glass of wine (148 mL), or one 1 oz glass of hard liquor (44 mL).  Lifestyle  Brush your teeth every morning and night with fluoride toothpaste. Floss one time each day.  Exercise for at least  30 minutes 5 or more days each week.  Do not use any products that contain nicotine or tobacco. These products include cigarettes, chewing tobacco, and vaping devices, such as e-cigarettes. If you need help quitting, ask your health care provider.  Do not use drugs.  If you are sexually active, practice safe sex. Use a condom or other form of protection to prevent STIs.  If you do not wish to become pregnant, use a form of birth control. If you plan to become pregnant, see your health care provider for a  prepregnancy visit.  Take aspirin only as told by your health care provider. Make sure that you understand how much to take and what form to take. Work with your health care provider to find out whether it is safe and beneficial for you to take aspirin daily.  Find healthy ways to manage stress, such as:  Meditation, yoga, or listening to music.  Journaling.  Talking to a trusted person.  Spending time with friends and family.  Minimize exposure to UV radiation to reduce your risk of skin cancer.  Safety  Always wear your seat belt while driving or riding in a vehicle.  Do not drive:  If you have been drinking alcohol. Do not ride with someone who has been drinking.  When you are tired or distracted.  While texting.  If you have been using any mind-altering substances or drugs.  Wear a helmet and other protective equipment during sports activities.  If you have firearms in your house, make sure you follow all gun safety procedures.  Seek help if you have been physically or sexually abused.  What's next?  Visit your health care provider once a year for an annual wellness visit.  Ask your health care provider how often you should have your eyes and teeth checked.  Stay up to date on all vaccines.  This information is not intended to replace advice given to you by your health care provider. Make sure you discuss any questions you have with your health care provider.  Document Revised: 01/07/2021 Document Reviewed: 01/07/2021  Elsevier Patient Education  2024 [employer].

## 2023-11-10 ENCOUNTER — Other Ambulatory Visit: Payer: Self-pay | Admitting: Family Medicine

## 2023-11-10 DIAGNOSIS — J4520 Mild intermittent asthma, uncomplicated: Secondary | ICD-10-CM

## 2023-12-26 ENCOUNTER — Encounter: Payer: Self-pay | Admitting: Emergency Medicine

## 2023-12-26 ENCOUNTER — Ambulatory Visit
Admission: EM | Admit: 2023-12-26 | Discharge: 2023-12-26 | Disposition: A | Discharge status: Home / Self Care | Attending: Emergency Medicine | Admitting: Emergency Medicine

## 2023-12-26 DIAGNOSIS — R300 Dysuria: Secondary | ICD-10-CM | POA: Diagnosis not present

## 2023-12-26 LAB — POCT URINALYSIS DIP (MANUAL ENTRY)
Bilirubin, UA: NEGATIVE
Glucose, UA: NEGATIVE mg/dL
Ketones, POC UA: NEGATIVE mg/dL
Leukocytes, UA: NEGATIVE
Nitrite, UA: NEGATIVE
Protein Ur, POC: NEGATIVE mg/dL
Spec Grav, UA: 1.025 (ref 1.010–1.025)
Urobilinogen, UA: 0.2 U/dL
pH, UA: 5.5 (ref 5.0–8.0)

## 2023-12-26 MED ORDER — SULFAMETHOXAZOLE-TRIMETHOPRIM 800-160 MG PO TABS
1.0000 | ORAL_TABLET | Freq: Two times a day (BID) | ORAL | 0 refills | Status: AC
Start: 2023-12-26 — End: 2024-01-02

## 2023-12-26 NOTE — Discharge Instructions (Signed)
 urinalysis is negative for infection, your urine will be sent to the lab to determine exactly which bacteria is present, if any changes need to be made to your medications you will be notified  Begin use of Bactrim twice daily for 7 days  You may use over-the-counter Azo to help minimize your symptoms until antibiotic removes bacteria, this medication will turn your urine orange  Increase your fluid intake through use of water  As always practice good hygiene, wiping front to back and avoidance of scented vaginal products to prevent further irritation  If symptoms continue to persist after use of medication or recur please follow-up with urgent care or your primary doctor as needed

## 2023-12-26 NOTE — ED Provider Notes (Signed)
 Robin Chan    CSN: [id number] Arrival date & time: 12/26/23  1810      History   Chief Complaint Chief Complaint  Patient presents with   Dysuria    HPI Robin Chan is a 53 y.o. female.   Patient presents for evaluation for dysuria, a constant burning sensation and urinary frequency beginning this morning.  Has not attempted treatment.  Denies vaginal symptoms, hematuria, abdominal flank pain or fever.   Past Medical History:  Diagnosis Date   ADHD (attention deficit hyperactivity disorder)    Allergy    Anxiety    Arthritis    Asthma    Chronic pelvic pain in female    Complex ovarian cyst    Depression    Dyspareunia    Endometriosis    Fibroid    GERD (gastroesophageal reflux disease)    Heavy period    MS (multiple sclerosis) (HCC)    Neuromuscular disorder (HCC)    Painful menstruation    Rectocele     Patient Active Problem List   Diagnosis Date Noted   B12 deficiency 10/05/2022   Dyslipidemia 10/05/2022   Pre-diabetes 10/05/2022   Rectocele 07/08/2015   Status post laparoscopic assisted vaginal hysterectomy (LAVH) 07/08/2015   Left lumbar radiculitis 03/28/2015   ADD (attention deficit disorder) 12/31/2014   Allergic to bees 12/31/2014   Other insomnia 12/31/2014   Constipation 12/31/2014   Depression, major, recurrent, mild (HCC) 12/31/2014   Asthma, mild intermittent 12/31/2014   Migraine with aura and without status migrainosus, not intractable 12/31/2014   Multiple sclerosis, relapsing-remitting (HCC) 12/31/2014   Endometriosis determined by laparoscopy 12/31/2014   Perennial allergic rhinitis with seasonal variation 12/31/2014   Pituitary microadenoma (HCC) 12/31/2014   Vitamin D  deficiency 12/31/2014   Acquired trigger finger 12/31/2014    Past Surgical History:  Procedure Laterality Date   ABDOMINAL HYSTERECTOMY  05/26/2015   BREAST BIOPSY Right 2005   benign   COLON SURGERY     HERNIA REPAIR     INCISIONAL HERNIA  REPAIR N/A 10/09/2019   Procedure: HERNIA REPAIR INCISIONAL;  Surgeon: Emmalene Hare, MD;  Location: [facility];  Service: General;  Laterality: N/A;   INSERTION OF MESH N/A 10/09/2019   Procedure: INSERTION OF MESH;  Surgeon: Emmalene Hare, MD;  Location: [facility];  Service: General;  Laterality: N/A;   LAPAROSCOPIC ASSISTED VAGINAL HYSTERECTOMY N/A 05/26/2015   Procedure: LAPAROSCOPIC ASSISTED VAGINAL HYSTERECTOMY, with right salpingectomy;  Surgeon: Colan Dash, MD;  Location: [facility];  Service: Gynecology;  Laterality: N/A;   LAPAROTOMY N/A 12/14/2018   Procedure: EXPLORATORY LAPAROTOMY;  Surgeon: Emmalene Hare, MD;  Location: [facility];  Service: General;  Laterality: N/A;   OVARY SURGERY Left 09/30/2014   RECTOCELE REPAIR N/A 05/26/2015   Procedure: POSTERIOR REPAIR (RECTOCELE);  Surgeon: Colan Dash, MD;  Location: [facility];  Service: Gynecology;  Laterality: N/A;    OB History     Gravida  2   Para  2   Term  2   Preterm      AB      Living  2      SAB      IAB      Ectopic      Multiple      Live Births  2            Home Medications    Prior to Admission medications   Medication Sig Start Date End Date Taking? Authorizing Provider  sulfamethoxazole-trimethoprim (BACTRIM DS) 800-160 MG tablet Take 1 tablet by mouth 2 (two) times daily for 7 days. 12/26/23 01/02/24 Yes Shea Kapur R, NP  Albuterol -Budesonide (AIRSUPRA ) 90-80 MCG/ACT AERO Inhale 2 puffs into the lungs 4 (four) times daily as needed. 10/14/23   Sowles, Krichna, MD  Cholecalciferol (VITAMIN D3) 50 MCG (2000 UT) TABS Take 2,000 Units by mouth daily.    [provider]  DULoxetine  (CYMBALTA ) 60 MG capsule Take 1 capsule (60 mg total) by mouth daily. 10/14/23   Sowles, Krichna, MD  EPINEPHrine  0.3 mg/0.3 mL IJ SOAJ injection Inject 0.3 mg into the muscle as needed for anaphylaxis.     [provider]  hydrOXYzine  (ATARAX ) 10 MG tablet Take 1-2 tablets (10-20 mg  total) by mouth at bedtime as needed. 04/07/23   Sowles, Krichna, MD  levocetirizine (XYZAL ) 5 MG tablet TAKE 1 TABLET(5 MG) BY MOUTH DAILY 02/28/20   Sowles, Krichna, MD  lisdexamfetamine (VYVANSE ) 40 MG capsule Take 1 capsule (40 mg total) by mouth every morning. 10/14/23   Sowles, Krichna, MD  montelukast  (SINGULAIR ) 10 MG tablet TAKE 1 TABLET BY MOUTH EVERYDAY AT BEDTIME 11/10/23   Rockney Cid, DO  QUEtiapine  (SEROQUEL ) 25 MG tablet Take 1 tablet (25 mg total) by mouth at bedtime. 10/14/23   Sowles, Krichna, MD    Family History Family History  Problem Relation Age of Onset   Anemia Mother    Osteoporosis Mother    Mental illness Father        Bipolar   Depression Father    Arthritis Brother    Breast cancer Maternal Aunt    ADD / ADHD Son    ADD / ADHD Daughter     Social History Social History   Tobacco Use   Smoking status: Some Days    Current packs/day: 0.50    Average packs/day: 0.4 packs/day for 18.4 years (7.0 ttl pk-yrs)    Types: Cigarettes    Start date: 89    Last attempt to quit: 1995   Smokeless tobacco: Never   Tobacco comments:    She rolls her own cigarette   Vaping Use   Vaping status: Former  Substance Use Topics   Alcohol use: Yes    Alcohol/week: 3.0 standard drinks of alcohol    Types: 3 Glasses of wine per week    Comment: occasionally, 1-2 shots every night   Drug use: No     Allergies   Penicillins and Bee venom   Review of Systems Review of Systems   Physical Exam Triage Vital Signs ED Triage Vitals  Encounter Vitals Group     BP 12/26/23 1850 108/71     Systolic BP Percentile --      Diastolic BP Percentile --      Pulse Rate 12/26/23 1850 81     Resp 12/26/23 1850 18     Temp 12/26/23 1850 98.6 F (37 C)     Temp Source 12/26/23 1850 Oral     SpO2 12/26/23 1850 98 %     Weight --      Height --      Head Circumference --      Peak Flow --      Pain Score 12/26/23 1851 8     Pain Loc --      Pain Education --       Exclude from Growth Chart --    No data found.  Updated Vital Signs BP 108/71 (BP Location: Left Arm)  Pulse 81   Temp 98.6 F (37 C) (Oral)   Resp 18   LMP 05/19/2015   SpO2 98%   Visual Acuity Right Eye Distance:   Left Eye Distance:   Bilateral Distance:    Right Eye Near:   Left Eye Near:    Bilateral Near:     Physical Exam Constitutional:      Appearance: Normal appearance.  Eyes:     Extraocular Movements: Extraocular movements intact.  Pulmonary:     Effort: Pulmonary effort is normal.  Abdominal:     Tenderness: There is no abdominal tenderness. There is no right CVA tenderness, left CVA tenderness or guarding.  Neurological:     Mental Status: She is alert and oriented to person, place, and time.      UC Treatments / Results  Labs (all labs ordered are listed, but only abnormal results are displayed) Labs Reviewed  POCT URINALYSIS DIP (MANUAL ENTRY) - Abnormal; Notable for the following components:      Result Value   Clarity, UA clear (*)    Blood, UA small (*)    All other components within normal limits  URINE CULTURE    EKG   Radiology No results found.  Procedures Procedures (including critical care time)  Medications Ordered in UC Medications - No data to display  Initial Impression / Assessment and Plan / UC Course  I have reviewed the triage vital signs and the nursing notes.  Pertinent labs & imaging results that were available during my care of the patient were reviewed by me and considered in my medical decision making (see chart for details).  Dysuria  Urinalysis negative, discussed findings, sent for culture, empirically placed on Bactrim, patient visibly uncomfortable rocking within exam chair however no abdominal or CVA tenderness, stable for outpatient management, recommended supportive measures and advised follow-up for any worsening or persisting symptoms Final Clinical Impressions(s) / UC Diagnoses   Final  diagnoses:  Dysuria   Discharge Instructions      urinalysis is negative for infection, your urine will be sent to the lab to determine exactly which bacteria is present, if any changes need to be made to your medications you will be notified  Begin use of Bactrim twice daily for 7 days  You may use over-the-counter Azo to help minimize your symptoms until antibiotic removes bacteria, this medication will turn your urine orange  Increase your fluid intake through use of water  As always practice good hygiene, wiping front to back and avoidance of scented vaginal products to prevent further irritation  If symptoms continue to persist after use of medication or recur please follow-up with urgent care or your primary doctor as needed   ED Prescriptions     Medication Sig Dispense Auth. Provider   sulfamethoxazole-trimethoprim (BACTRIM DS) 800-160 MG tablet Take 1 tablet by mouth 2 (two) times daily for 7 days. 14 tablet Ercil Cassis R, NP      PDMP not reviewed this encounter.   Reena Canning, [place] 12/26/23 [phone]

## 2023-12-26 NOTE — ED Triage Notes (Signed)
 Patient complains of painful urination and frequency x 1 day. Patient reports she has not taken anything for symptoms. Rates pain 8/10.

## 2023-12-27 LAB — URINE CULTURE

## 2023-12-28 ENCOUNTER — Ambulatory Visit (HOSPITAL_COMMUNITY): Payer: Self-pay

## 2023-12-30 ENCOUNTER — Telehealth: Payer: Self-pay | Admitting: Emergency Medicine

## 2023-12-30 ENCOUNTER — Other Ambulatory Visit (HOSPITAL_COMMUNITY): Admission: RE | Admit: 2023-12-30 | Discharge: 2023-12-30 | Disposition: A | Discharge status: Home / Self Care

## 2023-12-30 DIAGNOSIS — R300 Dysuria: Secondary | ICD-10-CM | POA: Diagnosis not present

## 2023-12-30 NOTE — Telephone Encounter (Signed)
 Return to clinic for recollection of urine sample is multiple species seen

## 2023-12-31 ENCOUNTER — Telehealth: Admitting: Physician Assistant

## 2023-12-31 DIAGNOSIS — R300 Dysuria: Secondary | ICD-10-CM | POA: Diagnosis not present

## 2023-12-31 LAB — URINE CULTURE: Culture: NO GROWTH

## 2023-12-31 MED ORDER — CIPROFLOXACIN HCL 500 MG PO TABS: 500.0000 mg | ORAL_TABLET | Freq: Two times a day (BID) | ORAL | 0 refills | Status: AC

## 2023-12-31 NOTE — Progress Notes (Signed)
 Virtual Visit Consent   Robin Chan, you are scheduled for a virtual visit with a [facility] provider today. Just as with appointments in the office, your consent must be obtained to participate. Your consent will be active for this visit and any virtual visit you may have with one of our providers in the next 365 days. If you have a MyChart account, a copy of this consent can be sent to you electronically.  As this is a virtual visit, video technology does not allow for your provider to perform a traditional examination. This may limit your provider's ability to fully assess your condition. If your provider identifies any concerns that need to be evaluated in person or the need to arrange testing (such as labs, EKG, etc.), we will make arrangements to do so. Although advances in technology are sophisticated, we cannot ensure that it will always work on either your end or our end. If the connection with a video visit is poor, the visit may have to be switched to a telephone visit. With either a video or telephone visit, we are not always able to ensure that we have a secure connection.  By engaging in this virtual visit, you consent to the provision of healthcare and authorize for your insurance to be billed (if applicable) for the services provided during this visit. Depending on your insurance coverage, you may receive a charge related to this service.  I need to obtain your verbal consent now. Are you willing to proceed with your visit today? Robin Chan has provided verbal consent on 12/31/2023 for a virtual visit (video or telephone). Robin Chan, [place]  Date: 12/31/2023 7:17 PM   Virtual Visit via Video Note   I, Robin Chan, connected with  Robin Chan  ([id number], 08/06/1970) on 12/31/23 at  7:15 PM EDT by a video-enabled telemedicine application and verified that I am speaking with the correct person using two identifiers.  Location: Patient: Virtual Visit Location  Patient: Home Provider: Virtual Visit Location Provider: Home Office   I discussed the limitations of evaluation and management by telemedicine and the availability of in person appointments. The patient expressed understanding and agreed to proceed.    History of Present Illness: Robin Chan is a 53 y.o. who identifies as a female who was assigned female at birth, and is being seen today for continued dysuria with urgency, frequency and hesitancy. Occassional slight pink tinged urine when wiping. Denies fever, chills, nausea or vomiting. Denies vaginal discharge or pain. Denies concern for STI.  Was evaluated on 6/2 at [facility] and started on bactrim  for suspected UTI. Initial culture with multiple organisms so was recollected yesterday.    HPI: HPI  Problems:  Patient Active Problem List   Diagnosis Date Noted   B12 deficiency 10/05/2022   Dyslipidemia 10/05/2022   Pre-diabetes 10/05/2022   Rectocele 07/08/2015   Status post laparoscopic assisted vaginal hysterectomy (LAVH) 07/08/2015   Left lumbar radiculitis 03/28/2015   ADD (attention deficit disorder) 12/31/2014   Allergic to bees 12/31/2014   Other insomnia 12/31/2014   Constipation 12/31/2014   Depression, major, recurrent, mild (HCC) 12/31/2014   Asthma, mild intermittent 12/31/2014   Migraine with aura and without status migrainosus, not intractable 12/31/2014   Multiple sclerosis, relapsing-remitting (HCC) 12/31/2014   Endometriosis determined by laparoscopy 12/31/2014   Perennial allergic rhinitis with seasonal variation 12/31/2014   Pituitary microadenoma (HCC) 12/31/2014   Vitamin D  deficiency 12/31/2014   Acquired trigger finger 12/31/2014  Allergies:  Allergies  Allergen Reactions   Penicillins Anaphylaxis    Did it involve swelling of the face/tongue/throat, SOB, or low BP? Yes Did it involve sudden or severe rash/hives, skin peeling, or any reaction on the inside of your mouth or nose? No Did you need to seek  medical attention at a hospital or doctor's office? Yes When did it last happen? A long time ago If all above answers are "NO", may proceed with cephalosporin use.   Bee Venom     Bee   Medications:  Current Outpatient Medications:    ciprofloxacin  (CIPRO ) 500 MG tablet, Take 1 tablet (500 mg total) by mouth 2 (two) times daily for 3 days., Disp: 6 tablet, Rfl: 0   Albuterol -Budesonide (AIRSUPRA ) 90-80 MCG/ACT AERO, Inhale 2 puffs into the lungs 4 (four) times daily as needed., Disp: 32.1 g, Rfl: 0   Cholecalciferol (VITAMIN D3) 50 MCG (2000 UT) TABS, Take 2,000 Units by mouth daily., Disp: , Rfl:    DULoxetine  (CYMBALTA ) 60 MG capsule, Take 1 capsule (60 mg total) by mouth daily., Disp: 90 capsule, Rfl: 1   EPINEPHrine  0.3 mg/0.3 mL IJ SOAJ injection, Inject 0.3 mg into the muscle as needed for anaphylaxis. , Disp: , Rfl:    hydrOXYzine  (ATARAX ) 10 MG tablet, Take 1-2 tablets (10-20 mg total) by mouth at bedtime as needed., Disp: 60 tablet, Rfl: 0   levocetirizine (XYZAL ) 5 MG tablet, TAKE 1 TABLET(5 MG) BY MOUTH DAILY, Disp: 90 tablet, Rfl: 1   lisdexamfetamine (VYVANSE ) 40 MG capsule, Take 1 capsule (40 mg total) by mouth every morning., Disp: 90 capsule, Rfl: 0   montelukast  (SINGULAIR ) 10 MG tablet, TAKE 1 TABLET BY MOUTH EVERYDAY AT BEDTIME, Disp: 30 tablet, Rfl: 0   QUEtiapine  (SEROQUEL ) 25 MG tablet, Take 1 tablet (25 mg total) by mouth at bedtime., Disp: 90 tablet, Rfl: 1   sulfamethoxazole -trimethoprim  (BACTRIM  DS) 800-160 MG tablet, Take 1 tablet by mouth 2 (two) times daily for 7 days., Disp: 14 tablet, Rfl: 0  Observations/Objective: Patient is well-developed, well-nourished in no acute distress.  Resting comfortably at home.  Head is normocephalic, atraumatic.  No labored breathing. Speech is clear and coherent with logical content.  Patient is alert and oriented at baseline.   Assessment and Plan: 1. Dysuria (Primary) - ciprofloxacin  (CIPRO ) 500 MG tablet; Take 1 tablet  (500 mg total) by mouth 2 (two) times daily for 3 days.  Dispense: 6 tablet; Refill: 0  Discussed infectious cystitis vs interstitial cystitis. Increase fluids. Avoid inflammatory foods - reviewed with patient. Stop Bactrim . Start Cipro  per orders. OTC H2 blocker recommended in case of IC as this will help with inflammation. Needs PCP follow-up first thing this week. ER precautions reviewed.  Follow Up Instructions: I discussed the assessment and treatment plan with the patient. The patient was provided an opportunity to ask questions and all were answered. The patient agreed with the plan and demonstrated an understanding of the instructions.  A copy of instructions were sent to the patient via MyChart unless otherwise noted below.   The patient was advised to call back or seek an in-person evaluation if the symptoms worsen or if the condition fails to improve as anticipated.    Robin Maillard, PA-C

## 2023-12-31 NOTE — Patient Instructions (Signed)
 Robin Chan, thank you for joining Hyla Maillard, PA-C for today's virtual visit.  While this provider is not your primary care provider (PCP), if your PCP is located in our provider database this encounter information will be shared with them immediately following your visit.   A [facility] MyChart account gives you access to today's visit and all your visits, tests, and labs performed at [facility] " click here if you don't have a [facility] MyChart account or go to [email or web address]  Consent: (Patient) Robin Chan provided verbal consent for this virtual visit at the beginning of the encounter.  Current Medications:  Current Outpatient Medications:    Albuterol -Budesonide (AIRSUPRA ) 90-80 MCG/ACT AERO, Inhale 2 puffs into the lungs 4 (four) times daily as needed., Disp: 32.1 g, Rfl: 0   Cholecalciferol (VITAMIN D3) 50 MCG (2000 UT) TABS, Take 2,000 Units by mouth daily., Disp: , Rfl:    DULoxetine  (CYMBALTA ) 60 MG capsule, Take 1 capsule (60 mg total) by mouth daily., Disp: 90 capsule, Rfl: 1   EPINEPHrine  0.3 mg/0.3 mL IJ SOAJ injection, Inject 0.3 mg into the muscle as needed for anaphylaxis. , Disp: , Rfl:    hydrOXYzine  (ATARAX ) 10 MG tablet, Take 1-2 tablets (10-20 mg total) by mouth at bedtime as needed., Disp: 60 tablet, Rfl: 0   levocetirizine (XYZAL ) 5 MG tablet, TAKE 1 TABLET(5 MG) BY MOUTH DAILY, Disp: 90 tablet, Rfl: 1   lisdexamfetamine (VYVANSE ) 40 MG capsule, Take 1 capsule (40 mg total) by mouth every morning., Disp: 90 capsule, Rfl: 0   montelukast  (SINGULAIR ) 10 MG tablet, TAKE 1 TABLET BY MOUTH EVERYDAY AT BEDTIME, Disp: 30 tablet, Rfl: 0   QUEtiapine  (SEROQUEL ) 25 MG tablet, Take 1 tablet (25 mg total) by mouth at bedtime., Disp: 90 tablet, Rfl: 1   sulfamethoxazole -trimethoprim  (BACTRIM  DS) 800-160 MG tablet, Take 1 tablet by mouth 2 (two) times daily for 7 days., Disp: 14 tablet, Rfl: 0   Medications ordered in this encounter:  No  orders of the defined types were placed in this encounter.    *If you need refills on other medications prior to your next appointment, please contact your pharmacy*  Follow-Up: Call back or seek an in-person evaluation if the symptoms worsen or if the condition fails to improve as anticipated.   Virtual Care [phone]  Other Instructions Your symptoms are consistent with a bladder infection, also called acute cystitis. Please stop the Bactrim  and take your new antibiotic (Cipro ) as directed until all pills are gone.  Stay very well hydrated.  Consider a daily probiotic (Align, Culturelle, or Activia) to help prevent stomach upset caused by the antibiotic.  Taking a probiotic daily may also help prevent recurrent UTIs.  Also consider taking AZO (Phenazopyridine ) tablets to help decrease pain with urination.   Urinary Tract Infection A urinary tract infection (UTI) can occur any place along the urinary tract. The tract includes the kidneys, ureters, bladder, and urethra. A type of germ called bacteria often causes a UTI. UTIs are often helped with antibiotic medicine.  HOME CARE  If given, take antibiotics as told by your doctor. Finish them even if you start to feel better. Drink enough fluids to keep your pee (urine) clear or pale yellow. Avoid tea, drinks with caffeine, and bubbly (carbonated) drinks. Pee often. Avoid holding your pee in for a long time. Pee before and after having sex (intercourse). Wipe from front to back after you poop (bowel movement) if you are  a woman. Use each tissue only once. GET HELP RIGHT AWAY IF:  You have back pain. You have lower belly (abdominal) pain. You have chills. You feel sick to your stomach (nauseous). You throw up (vomit). Your burning or discomfort with peeing does not go away. You have a fever. Your symptoms are not better in 3 days. MAKE SURE YOU:  Understand these instructions. Will watch your condition. Will get help  right away if you are not doing well or get worse. Document Released: 12/29/2007 Document Revised: 04/05/2012 Document Reviewed: 02/10/2012 [facility] Patient Information 2015 [place], [place]. This information is not intended to replace advice given to you by your health care provider. Make sure you discuss any questions you have with your health care provider.  Interstitial Cystitis  Interstitial cystitis (IC) is inflammation of the bladder. It's also called painful bladder syndrome. It can cause pain near your bladder. It can also make you have to pee urgently and often. IC may flare up and then go away for a while. In some cases, it may become a long-term (chronic) problem. What are the causes? The cause of IC isn't known. What increases the risk? You may be more likely to get IC if: You're female. You have certain other conditions. These include: Fibromyalgia. Irritable bowel syndrome (IBS). Endometriosis. Chronic fatigue syndrome. You may have worse symptoms if: You're under a lot of stress. You smoke. You have certain foods or drinks. What are the signs or symptoms? Your symptoms may change over time. They may include: Discomfort or pain near your bladder. The pain may range from mild to very bad. You may have more or less pain as your bladder fills with pee and empties. Pain in your pelvic area. This is the area between your hip bones. Needing to pee often or all the time. Pain when you pee. Pain during sex. Blood in your pee. Tiredness. If you're female, your symptoms may get worse when you have your menstrual period. How is this diagnosed? IC is diagnosed based on your symptoms, your medical history, and an exam. Your health care provider may need to rule out other conditions. They may do tests, such as: Pee tests. A cystoscopy. This test looks at the inside of your bladder. Biopsy. This is when a small piece of tissue is removed from your bladder for testing. How is  this treated? There's no cure for IC. But treatment can help you manage your symptoms. Work with your provider to find the best treatments for you. These may include: Medicines to help with pain or to reduce how often you feel the need to pee. These may be given by mouth or put in your bladder using a soft tube called a catheter. Diet changes. Taking steps to manage stress. Physical therapy. This may include: Exercises. These can help you relax your pelvic floor muscles. Massage. This may be done to relax tight muscles. Bladder training. This is when you learn ways to control when you pee. Neuromodulation therapy. This uses a device that's put on your back. It blocks the nerves that cause you to feel pain near your bladder. A procedure to stretch your bladder. This may be done by filling your bladder with air or fluid. Surgery. This is rare. It's only done if other treatments don't help. Follow these instructions at home: Eating and drinking Make changes to your diet as told by your provider. You may need to avoid: Spicy foods. Foods with acid in them, like tomatoes or citrus.  Foods with a lot of potassium in them. Avoid drinking alcohol and caffeine. These drinks can make you have to pee more. Lifestyle Learn and practice ways to relax. These may include deep breathing and muscle relaxation. Get care for your mental well-being. You may need to: Do cognitive behavioral therapy (CBT). This therapy can change the way you think or act in response to things. It may help you feel better. See a therapist if you're depressed. Work with your provider on other ways to manage pain. Acupuncture may help. Do not use any products that contain nicotine  or tobacco. These products include cigarettes, chewing tobacco, and vaping devices, such as e-cigarettes. If you need help quitting, ask your provider. Bladder training  Do bladder training as told. You may need to: Pee at set, regular times. Train  yourself to delay peeing. Keep a bladder diary. Write down: The times you pee. Any symptoms you have. The diary can help you find out what makes your symptoms worse. Use the diary to schedule times to pee. If you're away from home, plan to be near a bathroom at those times. Make sure you pee: Just before you leave the house. Just before you go to bed. General instructions Take over-the-counter and prescription medicines only as told by your provider. Try putting a warm or cool cloth called a compress over your bladder. This can help with pain. Avoid wearing tight clothes. Do exercises as told by your provider. Where to find more information U[employer] Care Foundation: urologyhealth.org Interstitial Cystitis Association (ICA): [employer] Contact a health care provider if: Your symptoms don't get better with treatment. Your pain or discomfort gets worse. You have to pee more often. You have little to no control over when you pee. You have a fever or chills. This information is not intended to replace advice given to you by your health care provider. Make sure you discuss any questions you have with your health care provider. Document Revised: 10/16/2022 Document Reviewed: 10/16/2022 Elsevier Patient Education  2024 Elsevier Inc.    If you have been instructed to have an in-person evaluation today at a local Urgent Care facility, please use the link below. It will take you to a list of all of our available [facility], including address, phone number and hours of operation. Please do not delay care.  [facility]  If you or a family member do not have a primary care provider, use the link below to schedule a visit and establish care. When you choose a [facility] primary care physician or advanced practice provider, you gain a long-term partner in health. Find a Primary Care Provider  Learn more about [facility]'s in-office and virtual care options: [facility] - Get Care Now

## 2024-01-16 ENCOUNTER — Ambulatory Visit: Admitting: Family Medicine

## 2024-01-16 ENCOUNTER — Encounter: Payer: Self-pay | Admitting: Family Medicine

## 2024-01-16 VITALS — BP 104/66 | HR 94 | Resp 16 | Ht 69.0 in | Wt 194.5 lb

## 2024-01-16 DIAGNOSIS — D352 Benign neoplasm of pituitary gland: Secondary | ICD-10-CM

## 2024-01-16 DIAGNOSIS — N898 Other specified noninflammatory disorders of vagina: Secondary | ICD-10-CM | POA: Diagnosis not present

## 2024-01-16 DIAGNOSIS — F902 Attention-deficit hyperactivity disorder, combined type: Secondary | ICD-10-CM | POA: Diagnosis not present

## 2024-01-16 DIAGNOSIS — F330 Major depressive disorder, recurrent, mild: Secondary | ICD-10-CM | POA: Diagnosis not present

## 2024-01-16 DIAGNOSIS — E538 Deficiency of other specified B group vitamins: Secondary | ICD-10-CM | POA: Diagnosis not present

## 2024-01-16 DIAGNOSIS — R7303 Prediabetes: Secondary | ICD-10-CM | POA: Diagnosis not present

## 2024-01-16 DIAGNOSIS — J4520 Mild intermittent asthma, uncomplicated: Secondary | ICD-10-CM

## 2024-01-16 DIAGNOSIS — G4709 Other insomnia: Secondary | ICD-10-CM | POA: Diagnosis not present

## 2024-01-16 DIAGNOSIS — E559 Vitamin D deficiency, unspecified: Secondary | ICD-10-CM

## 2024-01-16 DIAGNOSIS — G35 Multiple sclerosis: Secondary | ICD-10-CM | POA: Diagnosis not present

## 2024-01-16 DIAGNOSIS — J3089 Other allergic rhinitis: Secondary | ICD-10-CM | POA: Diagnosis not present

## 2024-01-16 DIAGNOSIS — J302 Other seasonal allergic rhinitis: Secondary | ICD-10-CM

## 2024-01-16 MED ORDER — FLUCONAZOLE 150 MG PO TABS: 150.0000 mg | ORAL_TABLET | ORAL | 0 refills | Status: DC

## 2024-01-16 MED ORDER — LISDEXAMFETAMINE DIMESYLATE 40 MG PO CAPS: 40.0000 mg | ORAL_CAPSULE | ORAL | 0 refills | Status: DC

## 2024-01-16 MED ORDER — MONTELUKAST SODIUM 10 MG PO TABS: 10.0000 mg | ORAL_TABLET | Freq: Every day | ORAL | 1 refills | Status: DC

## 2024-01-16 MED ORDER — HYDROXYZINE HCL 10 MG PO TABS: 10.0000 mg | ORAL_TABLET | Freq: Every evening | ORAL | 0 refills | Status: DC | PRN

## 2024-01-16 NOTE — Progress Notes (Signed)
 Name: Robin Chan   MRN: [id number]    DOB: [birth year]   Date:01/16/2024       Progress Note  Subjective  Chief Complaint  Chief Complaint  Patient presents with   Medical Management of Chronic Issues   Discussed the use of AI scribe software for clinical note transcription with the patient, who gave verbal consent to proceed.  History of Present Illness Robin Chan is a 53 year old female who presents with a yeast infection.  She recently experienced a urinary tract infection that progressed to a bladder infection, treated with antibiotics. Following this, she developed a yeast infection. Initial symptoms of burning began on December 26, 2023, leading her to urgent care where she was prescribed AZO and Bactrim . A urine culture was contaminated, and a subsequent culture was negative. She currently experiences itching and mild burning, less severe than the previous week. No vaginal discharge or pelvic pain.  She has a history of relapsing multiple sclerosis, currently stable. She experiences tingling in her right arm, exacerbated by heat, and has a stable foot drop on the left side. Her last brain MRI was in 2021.  She has a history of a pituitary microadenoma, last evaluated in 2021, with no recent changes. No double vision or headaches related to this condition.  Her attention deficit hyperactivity disorder is managed with Vyvanse , which helps with ADHD symptoms and MS-related fatigue. No side effects from the medication.  She manages major recurrent depression with duloxetine  60 mg, with a three-month supply available. She is excited about an upcoming trip to [place] to visit family, anticipating a positive experience.  Her asthma is managed with Singulair , which is effective. She has not needed to fill her Airsupra  prescription. She takes Xyzal  over the counter for allergies and uses hydroxyzine  for sleep.  She has prediabetes with an A1c of 5.9 and is adjusting her eating schedule,  aiming to eat dinner earlier. She typically takes her lunch break at 4:30 PM and opts for salads and yogurt or applesauce.  Recent lab work showed improved lipid levels, with LDL cholesterol decreasing from 135 to 115. No anemia, normal platelet count, and good kidney function. Potassium was slightly elevated, but she is not on potassium supplements. She takes vitamin B12 over the counter but has run out of vitamin D  supplements.    Patient Active Problem List   Diagnosis Date Noted   B12 deficiency 10/05/2022   Dyslipidemia 10/05/2022   Pre-diabetes 10/05/2022   Rectocele 07/08/2015   Status post laparoscopic assisted vaginal hysterectomy (LAVH) 07/08/2015   Left lumbar radiculitis 03/28/2015   ADD (attention deficit disorder) 12/31/2014   Allergic to bees 12/31/2014   Other insomnia 12/31/2014   Constipation 12/31/2014   Depression, major, recurrent, mild (HCC) 12/31/2014   Asthma, mild intermittent 12/31/2014   Migraine with aura and without status migrainosus, not intractable 12/31/2014   Multiple sclerosis, relapsing-remitting (HCC) 12/31/2014   Endometriosis determined by laparoscopy 12/31/2014   Perennial allergic rhinitis with seasonal variation 12/31/2014   Pituitary microadenoma (HCC) 12/31/2014   Vitamin D  deficiency 12/31/2014   Acquired trigger finger 12/31/2014    Past Surgical History:  Procedure Laterality Date   ABDOMINAL HYSTERECTOMY  05/26/2015   BREAST BIOPSY Right 2005   benign   COLON SURGERY     HERNIA REPAIR     INCISIONAL HERNIA REPAIR N/A 10/09/2019   Procedure: HERNIA REPAIR INCISIONAL;  Surgeon: Desiderio Schanz, MD;  Location: [facility] ORS;  Service: General;  Laterality: N/A;  INSERTION OF MESH N/A 10/09/2019   Procedure: INSERTION OF MESH;  Surgeon: Desiderio Schanz, MD;  Location: [facility] OR[facility];  Service: General;  Laterality: N/A;   LAPAROSCOPIC ASSISTED VAGINAL HYSTERECTOMY N/A 05/26/2015   Procedure: LAPAROSCOPIC ASSISTED VAGINAL HYSTERECTOMY, with right  salpingectomy;  Surgeon: Gladis DELENA Dollar, MD;  Location: [facility] OR[facility];  Service: Gynecology;  Laterality: N/A;   LAPAROTOMY N/A 12/14/2018   Procedure: EXPLORATORY LAPAROTOMY;  Surgeon: Desiderio Schanz, MD;  Location: [facility] ORS;  Service: General;  Laterality: N/A;   OVARY SURGERY Left 09/30/2014   RECTOCELE REPAIR N/A 05/26/2015   Procedure: POSTERIOR REPAIR (RECTOCELE);  Surgeon: Gladis DELENA Dollar, MD;  Location: [facility] ORS;  Service: Gynecology;  Laterality: N/A;    Family History  Problem Relation Age of Onset   Anemia Mother    Osteoporosis Mother    Mental illness Father        Bipolar   Depression Father    Arthritis Brother    Breast cancer Maternal Aunt    ADD / ADHD Son    ADD / ADHD Daughter    ADD / ADHD Daughter    ADD / ADHD Son     Social History   Tobacco Use   Smoking status: Some Days    Current packs/day: 0.50    Average packs/day: 0.4 packs/day for 18.5 years (7.0 ttl pk-yrs)    Types: Cigarettes    Start date: 62    Last attempt to quit: 1995   Smokeless tobacco: Never   Tobacco comments:    She rolls her own cigarette   Substance Use Topics   Alcohol use: Yes    Alcohol/week: 3.0 standard drinks of alcohol    Types: 3 Glasses of wine per week    Comment: occasionally, 1-2 shots every night     Current Outpatient Medications:    cetirizine (ZYRTEC) 10 MG tablet, Take 10 mg by mouth daily., Disp: , Rfl:    DULoxetine  (CYMBALTA ) 60 MG capsule, Take 1 capsule (60 mg total) by mouth daily., Disp: 90 capsule, Rfl: 1   EPINEPHrine  0.3 mg/0.3 mL IJ SOAJ injection, Inject 0.3 mg into the muscle as needed for anaphylaxis. , Disp: , Rfl:    hydrOXYzine  (ATARAX ) 10 MG tablet, Take 1-2 tablets (10-20 mg total) by mouth at bedtime as needed., Disp: 60 tablet, Rfl: 0   levocetirizine (XYZAL ) 5 MG tablet, TAKE 1 TABLET(5 MG) BY MOUTH DAILY, Disp: 90 tablet, Rfl: 1   lisdexamfetamine (VYVANSE ) 40 MG capsule, Take 1 capsule (40 mg total) by mouth every morning.,  Disp: 90 capsule, Rfl: 0   montelukast  (SINGULAIR ) 10 MG tablet, TAKE 1 TABLET BY MOUTH EVERYDAY AT BEDTIME, Disp: 30 tablet, Rfl: 0   QUEtiapine  (SEROQUEL ) 25 MG tablet, Take 1 tablet (25 mg total) by mouth at bedtime., Disp: 90 tablet, Rfl: 1   Albuterol -Budesonide (AIRSUPRA ) 90-80 MCG/ACT AERO, Inhale 2 puffs into the lungs 4 (four) times daily as needed. (Patient not taking: Reported on 01/16/2024), Disp: 32.1 g, Rfl: 0   Cholecalciferol (VITAMIN D3) 50 MCG (2000 UT) TABS, Take 2,000 Units by mouth daily. (Patient not taking: Reported on 01/16/2024), Disp: , Rfl:   Allergies  Allergen Reactions   Penicillins Anaphylaxis    Did it involve swelling of the face/tongue/throat, SOB, or low BP? Yes Did it involve sudden or severe rash/hives, skin peeling, or any reaction on the inside of your mouth or nose? No Did you need to seek medical attention at a hospital or doctor's office? Yes  When did it last happen? A long time ago If all above answers are "NO", may proceed with cephalosporin use.   Bee Venom     Bee    I personally reviewed active problem list, medication list, allergies with the patient/caregiver today.   ROS  Ten systems reviewed and is negative except as mentioned in HPI    Objective Physical Exam Constitutional: Patient appears well-developed and well-nourished. ONo distress.  HEENT: head atraumatic, normocephalic, pupils equal and reactive to light, ears , neck supple, throat within normal limits Cardiovascular: Normal rate, regular rhythm and normal heart sounds.  No murmur heard. No BLE edema. Pulmonary/Chest: Effort normal and breath sounds normal. No respiratory distress. Abdominal: Soft.  There is no tenderness. Neurological: paresthesia right hand, stable left foot drop when tired Psychiatric: Patient has a normal mood and affect. behavior is normal. Judgment and thought content normal.    Vitals:   01/16/24 1309  BP: 104/66  Pulse: 94  Resp: 16  SpO2: 96%   Weight: 194 lb 8 oz (88.2 kg)  Height: 5' 9 (1.753 m)    Body mass index is 28.72 kg/m.  Recent Results (from the past 2160 hours)  POCT urinalysis dipstick     Status: Abnormal   Collection Time: 12/26/23  7:02 PM  Result Value Ref Range   Color, UA yellow yellow   Robin, UA clear (A) clear   Glucose, UA negative negative mg/dL   Bilirubin, UA negative negative   Ketones, POC UA negative negative mg/dL   Spec Grav, UA 8.974 8.989 - 1.025   Blood, UA small (A) negative   pH, UA 5.5 5.0 - 8.0   Protein Ur, POC negative negative mg/dL   Urobilinogen, UA 0.2 0.2 or 1.0 E.U./dL   Nitrite, UA Negative Negative   Leukocytes, UA Negative Negative  Urine Culture     Status: Abnormal   Collection Time: 12/26/23  7:08 PM   Specimen: Urine, Clean Catch  Result Value Ref Range   Specimen Description URINE, CLEAN CATCH    Special Requests      NONE Performed at [facility], [address] [place], [place] [address]    Culture MULTIPLE SPECIES PRESENT, SUGGEST RECOLLECTION (A)    Report Status 12/27/2023 FINAL   Urine Culture     Status: None   Collection Time: 12/30/23  1:21 PM   Specimen: Urine, Random  Result Value Ref Range   Specimen Description URINE, RANDOM    Special Requests NONE    Culture      NO GROWTH Performed at [facility], [address], [place], [place] [address]    Report Status 12/31/2023 FINAL       PHQ2/9:    01/16/2024    1:08 PM 10/14/2023    9:09 AM 07/08/2023   11:33 AM 04/07/2023    9:24 AM 01/07/2023    9:33 AM  Depression screen PHQ 2/9  Decreased Interest 0 1 1 1  0  Down, Depressed, Hopeless 0 1 1 1  0  PHQ - 2 Score 0 2 2 2  0  Altered sleeping 0 1 1 3 2   Tired, decreased energy 0 1 1 3 2   Change in appetite 0 1 1 0 0  Feeling bad or failure about yourself  0 0 1 0 0  Trouble concentrating 0 1 1 3 1   Moving slowly or fidgety/restless 0 0 0 0 0  Suicidal thoughts 0 0 1 0 0  PHQ-9 Score  0 6 8 11 5   Difficult doing  work/chores Not difficult at all Somewhat difficult Very difficult      phq 9 is negative  Fall Risk:    01/16/2024    1:05 PM 07/08/2023   11:27 AM 04/07/2023    9:22 AM 01/07/2023    9:33 AM 10/15/2022    8:49 AM  Fall Risk   Falls in the past year? 0 0 1 0 0  Number falls in past yr: 0 0 0 0 0  Injury with Fall? 0 0 0 0 0  Risk for fall due to : No Fall Risks No Fall Risks No Fall Risks No Fall Risks   Follow up Falls prevention discussed;Falls evaluation completed;Education provided Falls prevention discussed;Education provided;Falls evaluation completed Falls prevention discussed Falls prevention discussed       Assessment & Plan Yeast infection Likely secondary to antibiotic use. Differential includes gonorrhea or chlamydia if dysuria persists she will return for vaginal swab, but unlikely due to monogamous relationship and lack of symptoms. - Prescribe Diflucan. - Advise to return for evaluation if symptoms persist, including providing a specimen for testing.  Relapsing-remitting multiple sclerosis Heat sensitivity, right arm tingling, and left foot drop. No recent MRI due to cost. - Monitor symptoms and advise MRI if symptoms change. - Discuss insurance and cost considerations for future MRI.  Major depressive disorder, recurrent Continues on duloxetine  60 mg with no side effects. - Continue duloxetine  60 mg. - Ensure medication supply is adequate for upcoming trip.  Attention-deficit hyperactivity disorder Managed with Vyvanse . Good control of symptoms with no side effects. Helps with MS-related fatigue. - Prescribe Vyvanse . - Ensure 90-day supply is available.  Asthma Managed with Singulair . No need for Airsupra  or Zyxal at this time - Continue Singulair . - Ensure 90-day supply is available.  Prediabetes Recent A1c of 5.9. Adjusted meal timing to avoid late-night eating. Discussed dietary modifications. - Advise on dietary modifications, including earlier  dinner and healthier meal choices.  Pituitary microadenoma Last evaluation several years ago with no significant changes. - Plan for follow-up imaging next year. - Discuss budgeting for future imaging studies.

## 2024-01-17 ENCOUNTER — Other Ambulatory Visit: Payer: Self-pay | Admitting: Family Medicine

## 2024-01-17 DIAGNOSIS — F330 Major depressive disorder, recurrent, mild: Secondary | ICD-10-CM

## 2024-01-17 DIAGNOSIS — F902 Attention-deficit hyperactivity disorder, combined type: Secondary | ICD-10-CM

## 2024-01-17 DIAGNOSIS — G35 Multiple sclerosis: Secondary | ICD-10-CM

## 2024-01-17 MED ORDER — LISDEXAMFETAMINE DIMESYLATE 40 MG PO CAPS: 40.0000 mg | ORAL_CAPSULE | ORAL | 0 refills | Status: DC

## 2024-01-31 ENCOUNTER — Emergency Department
Admission: EM | Admit: 2024-01-31 | Discharge: 2024-01-31 | Disposition: A | Discharge status: Home / Self Care | Attending: Emergency Medicine | Admitting: Emergency Medicine

## 2024-01-31 ENCOUNTER — Encounter: Payer: Self-pay | Admitting: Family Medicine

## 2024-01-31 ENCOUNTER — Emergency Department

## 2024-01-31 DIAGNOSIS — R1033 Periumbilical pain: Secondary | ICD-10-CM | POA: Insufficient documentation

## 2024-01-31 LAB — COMPREHENSIVE METABOLIC PANEL WITH GFR
ALT: 15 U/L (ref 0–44)
AST: 18 U/L (ref 15–41)
Albumin: 3.9 g/dL (ref 3.5–5.0)
Alkaline Phosphatase: 61 U/L (ref 38–126)
Anion gap: 10 (ref 5–15)
BUN: 16 mg/dL (ref 6–20)
CO2: 27 mmol/L (ref 22–32)
Calcium: 9.4 mg/dL (ref 8.9–10.3)
Chloride: 103 mmol/L (ref 98–111)
Creatinine, Ser: 0.96 mg/dL (ref 0.44–1.00)
GFR, Estimated: >60 mL/min (ref >60)
Glucose, Bld: 102 mg/dL — ABNORMAL HIGH (ref 70–99)
Potassium: 4.1 mmol/L (ref 3.5–5.1)
Sodium: 140 mmol/L (ref 135–145)
Total Bilirubin: 1 mg/dL (ref 0.0–1.2)
Total Protein: 7.2 g/dL (ref 6.5–8.1)

## 2024-01-31 LAB — CBC
HCT: 35.7 % — ABNORMAL LOW (ref 36.0–46.0)
Hemoglobin: 11.7 g/dL — ABNORMAL LOW (ref 12.0–15.0)
MCH: 30.5 pg (ref 26.0–34.0)
MCHC: 32.8 g/dL (ref 30.0–36.0)
MCV: 93 fL (ref 80.0–100.0)
Platelets: 370 K/uL (ref 150–400)
RBC: 3.84 MIL/uL — ABNORMAL LOW (ref 3.87–5.11)
RDW: 11.9 % (ref 11.5–15.5)
WBC: 8.6 K/uL (ref 4.0–10.5)
nRBC: 0 % (ref 0.0–0.2)

## 2024-01-31 LAB — URINALYSIS, ROUTINE W REFLEX MICROSCOPIC
Bilirubin Urine: NEGATIVE
Glucose, UA: NEGATIVE mg/dL
Hgb urine dipstick: NEGATIVE
Ketones, ur: NEGATIVE mg/dL
Leukocytes,Ua: NEGATIVE
Nitrite: NEGATIVE
Protein, ur: NEGATIVE mg/dL
Specific Gravity, Urine: 1.016 (ref 1.005–1.030)
pH: 8 (ref 5.0–8.0)

## 2024-01-31 LAB — LIPASE, BLOOD: Lipase: 25 U/L (ref 11–51)

## 2024-01-31 MED ORDER — DICYCLOMINE HCL 10 MG/ML IM SOLN
20.0000 mg | Freq: Once | INTRAMUSCULAR | Status: AC
  Administered 2024-01-31: 20 mg via INTRAMUSCULAR
  Filled 2024-01-31: qty 2

## 2024-01-31 MED ORDER — DICYCLOMINE HCL 10 MG PO CAPS: 10.0000 mg | ORAL_CAPSULE | Freq: Three times a day (TID) | ORAL | 0 refills | Status: DC

## 2024-01-31 NOTE — ED Notes (Signed)
 Patient transported to CT

## 2024-01-31 NOTE — ED Triage Notes (Signed)
 Pt presents to the ED via POV from home with abdominal pain x 1 month. Pt reports having a mesh placed for her abdominal hernia about four years ago. Reports this to be a similar pain. Pt denies N/V/D

## 2024-01-31 NOTE — ED Provider Notes (Signed)
-----------------------------------------   7:29 PM on 01/31/2024 -----------------------------------------  Blood pressure 119/86, pulse 93, temperature 98 F (36.7 C), temperature source Oral, resp. rate 18, height 5' 9 (1.753 m), weight 88 kg, last menstrual period 05/19/2015, SpO2 93%.  Assuming care from Dr. Jossie.  In short, Robin Chan is a 53 y.o. female with a chief complaint of Abdominal Pain .  Refer to the original H&P for additional details.  The current plan of care is to await results of CT and manage pain as needed.    Clinical Course as of 01/31/24 2044  Tue Jan 31, 2024  1954 CT abdomen and pelvis is negative for acute findings. Results discussed with the patient who reports she is still having waves of pain. No nausea or vomiting. She states that she has not been able to have a bowel movement in a few days. Bentyl  IM ordered. [CT]  2043 Patient reports improvement after Bentyl .  She has tolerated crackers and ginger ale.  She will be discharged home with a prescription for the same.  She was advised that she may want to try milk of magnesia if she is feeling constipated.  She is to follow-up with Dr. Desiderio and will call to schedule an appointment. [CT]    Clinical Course User Index [CT] Herlinda Kirk NOVAK, FNP      Herlinda Kirk NOVAK, FNP 01/31/24 2044    Bradler, Evan K, MD 02/05/24 2001

## 2024-01-31 NOTE — ED Notes (Signed)
Pt encouraged to give a urine sample.  

## 2024-01-31 NOTE — Discharge Instructions (Signed)
 Take the Bentyl  as prescribed for waves of abdominal pain.  To help have a bowel movement, you may want to try milk of magnesia as directed on the bottle.  Return to the emergency department if your symptoms change or worsen and you are unable to see the surgeon or primary care.

## 2024-01-31 NOTE — ED Provider Notes (Signed)
 [facility] Provider Note   Event Date/Time   First MD Initiated Contact with Patient 01/31/24 1748     (approximate) History  Abdominal Pain  HPI Robin Chan is a 53 y.o. female with a stated past medical history of umbilical hernia repair with mesh who presents complaining of of periumbilical pain after she felt a tearing at this periumbilical region approximately 2 weeks prior to arrival.  Patient states that this pain has been increasing since then and is associated with any movement or palpation over her umbilicus.  Patient has been using over-the-counter analgesia with no improvement in her symptoms. ROS: Patient currently denies any vision changes, tinnitus, difficulty speaking, facial droop, sore throat, chest pain, shortness of breath, abdominal pain, nausea/vomiting/diarrhea, dysuria, or weakness/numbness/paresthesias in any extremity   Physical Exam  Triage Vital Signs: ED Triage Vitals  Encounter Vitals Group     BP 01/31/24 1714 119/86     Girls Systolic BP Percentile --      Girls Diastolic BP Percentile --      Boys Systolic BP Percentile --      Boys Diastolic BP Percentile --      Pulse Rate 01/31/24 1714 93     Resp 01/31/24 1714 18     Temp 01/31/24 1714 98 F (36.7 C)     Temp Source 01/31/24 1714 Oral     SpO2 01/31/24 1714 93 %     Weight 01/31/24 1715 194 lb (88 kg)     Height 01/31/24 1715 5' 9 (1.753 m)     Head Circumference --      Peak Flow --      Pain Score 01/31/24 1715 8     Pain Loc --      Pain Education --      Exclude from Growth Chart --    Most recent vital signs: Vitals:   01/31/24 1714  BP: 119/86  Pulse: 93  Resp: 18  Temp: 98 F (36.7 C)  SpO2: 93%   General: Awake, oriented x4. CV:  Good peripheral perfusion. Resp:  Normal effort. Abd:  No distention.  Tenderness to palpation on the right lateral aspect of the periumbilical area Other:  Middle-aged overweight African-American female resting  comfortably in no acute distress ED Results / Procedures / Treatments  Labs (all labs ordered are listed, but only abnormal results are displayed) Labs Reviewed  COMPREHENSIVE METABOLIC PANEL WITH GFR - Abnormal; Notable for the following components:      Result Value   Glucose, Bld 102 (*)    All other components within normal limits  CBC - Abnormal; Notable for the following components:   RBC 3.84 (*)    Hemoglobin 11.7 (*)    HCT 35.7 (*)    All other components within normal limits  LIPASE, BLOOD  URINALYSIS, ROUTINE W REFLEX MICROSCOPIC   RADIOLOGY ED MD interpretation: CT of the abdomen and pelvis pending - All radiology independently interpreted and agree with radiology assessment Official radiology report(s): No results found. PROCEDURES: Critical Care performed: No Procedures MEDICATIONS ORDERED IN ED: Medications - No data to display IMPRESSION / MDM / ASSESSMENT AND PLAN / ED COURSE  I reviewed the triage vital signs and the nursing notes.                             The patient is on the cardiac monitor to evaluate for evidence of arrhythmia  and/or significant heart rate changes. Patient's presentation is most consistent with acute presentation with potential threat to life or bodily function. Patient's symptoms not typical for emergent causes of abdominal pain such as, but not limited to, appendicitis, abdominal aortic aneurysm, surgical biliary disease, pancreatitis, SBO, mesenteric ischemia, serious intra-abdominal bacterial illness. Presentation also not typical of gynecologic emergencies such as TOA, Ovarian Torsion, PID. Not Ectopic. Doubt atypical ACS.  Disposition: Care of this patient will be signed out to the oncoming physician at the end of my shift.  All pertinent patient information conveyed and all questions answered.  All further care and disposition decisions will be made by the oncoming physician. Clinical Course as of 02/05/24 2004  Tue Jan 31, 2024  1954 CT abdomen and pelvis is negative for acute findings. Results discussed with the patient who reports she is still having waves of pain. No nausea or vomiting. She states that she has not been able to have a bowel movement in a few days. Bentyl  IM ordered. [CT]  2043 Patient reports improvement after Bentyl .  She has tolerated crackers and ginger ale.  She will be discharged home with a prescription for the same.  She was advised that she may want to try milk of magnesia if she is feeling constipated.  She is to follow-up with Dr. Desiderio and will call to schedule an appointment. [CT]    Clinical Course User Index [CT] Triplett, Cari B, FNP   FINAL CLINICAL IMPRESSION(S) / ED DIAGNOSES   Final diagnoses:  None   Rx / DC Orders   ED Discharge Orders     None      Note:  This document was prepared using Dragon voice recognition software and may include unintentional dictation errors.   Nyjah Denio K, MD 02/05/24 2005

## 2024-02-12 ENCOUNTER — Other Ambulatory Visit: Payer: Self-pay | Admitting: Family Medicine

## 2024-02-12 DIAGNOSIS — G4709 Other insomnia: Secondary | ICD-10-CM

## 2024-02-13 ENCOUNTER — Other Ambulatory Visit: Payer: Self-pay | Admitting: Family Medicine

## 2024-02-13 ENCOUNTER — Encounter: Payer: Self-pay | Admitting: Surgery

## 2024-02-13 ENCOUNTER — Ambulatory Visit: Admitting: Surgery

## 2024-02-13 VITALS — BP 129/80 | HR 84 | Temp 97.9°F | Ht 69.0 in | Wt 195.2 lb

## 2024-02-13 DIAGNOSIS — R1033 Periumbilical pain: Secondary | ICD-10-CM

## 2024-02-13 DIAGNOSIS — R1013 Epigastric pain: Secondary | ICD-10-CM | POA: Diagnosis not present

## 2024-02-13 DIAGNOSIS — G35 Multiple sclerosis: Secondary | ICD-10-CM

## 2024-02-13 DIAGNOSIS — F330 Major depressive disorder, recurrent, mild: Secondary | ICD-10-CM

## 2024-02-13 DIAGNOSIS — F902 Attention-deficit hyperactivity disorder, combined type: Secondary | ICD-10-CM

## 2024-02-13 MED ORDER — LISDEXAMFETAMINE DIMESYLATE 40 MG PO CAPS: 40.0000 mg | ORAL_CAPSULE | ORAL | 0 refills | Status: DC

## 2024-02-13 NOTE — Progress Notes (Signed)
 02/13/2024  Reason for Visit:  Abdominal pain  History of Present Illness: Robin Chan is a 53 y.o. female s/p exploratory laparotomy and lysis of adhesions for small bowel obstruction on 12/14/18 and also s/p open incisional hernia repair with mesh on 10/09/19 for a hernia defect about 6 cm in size.  The patient had been doing well until recently that she felt a tearing sensation in the umbilical area.  The patient reports the area gets more aggravated when she's up and ambulating or doing activities, but eases when she's lying down.  She's also been having a lot of pain in the epigastric area separate from the pain in the periumbilical area.  She denies any nausea or vomiting, constipation, fevers, chills.  She presented to the ED on 01/31/24 and had a CT scan which did not show any hernia recurrence or other acute findings.  She continues having pain.  Has not been trying much of over the counter pain medications.  Past Medical History: Past Medical History:  Diagnosis Date   ADHD (attention deficit hyperactivity disorder)    Allergy    Anxiety    Arthritis    Asthma    Chronic pelvic pain in female    Complex ovarian cyst    Depression    Dyspareunia    Endometriosis    Fibroid    GERD (gastroesophageal reflux disease)    Heavy period    MS (multiple sclerosis) (HCC)    Neuromuscular disorder (HCC)    Painful menstruation    Rectocele      Past Surgical History: Past Surgical History:  Procedure Laterality Date   ABDOMINAL HYSTERECTOMY  05/26/2015   BREAST BIOPSY Right 2005   benign   COLON SURGERY     HERNIA REPAIR     INCISIONAL HERNIA REPAIR N/A 10/09/2019   Procedure: HERNIA REPAIR INCISIONAL;  Surgeon: Desiderio Schanz, MD;  Location: [facility] ORS;  Service: General;  Laterality: N/A;   INSERTION OF MESH N/A 10/09/2019   Procedure: INSERTION OF MESH;  Surgeon: Desiderio Schanz, MD;  Location: [facility];  Service: General;  Laterality: N/A;   LAPAROSCOPIC ASSISTED VAGINAL  HYSTERECTOMY N/A 05/26/2015   Procedure: LAPAROSCOPIC ASSISTED VAGINAL HYSTERECTOMY, with right salpingectomy;  Surgeon: Gladis DELENA Dollar, MD;  Location: [facility] OR[facility];  Service: Gynecology;  Laterality: N/A;   LAPAROTOMY N/A 12/14/2018   Procedure: EXPLORATORY LAPAROTOMY;  Surgeon: Desiderio Schanz, MD;  Location: [facility] OR[facility];  Service: General;  Laterality: N/A;   OVARY SURGERY Left 09/30/2014   RECTOCELE REPAIR N/A 05/26/2015   Procedure: POSTERIOR REPAIR (RECTOCELE);  Surgeon: Gladis DELENA Dollar, MD;  Location: [facility];  Service: Gynecology;  Laterality: N/A;    Home Medications: Prior to Admission medications   Medication Sig Start Date End Date Taking? Authorizing Provider  Albuterol -Budesonide (AIRSUPRA ) 90-80 MCG/ACT AERO Inhale 2 puffs into the lungs 4 (four) times daily as needed. 10/14/23  Yes Sowles, Krichna, MD  cetirizine (ZYRTEC) 10 MG tablet Take 10 mg by mouth daily.   Yes [provider]  Cholecalciferol (VITAMIN D3) 50 MCG (2000 UT) TABS Take 2,000 Units by mouth daily.   Yes [provider]  DULoxetine  (CYMBALTA ) 60 MG capsule Take 1 capsule (60 mg total) by mouth daily. 10/14/23  Yes Sowles, Krichna, MD  EPINEPHrine  0.3 mg/0.3 mL IJ SOAJ injection Inject 0.3 mg into the muscle as needed for anaphylaxis.    Yes [provider]  hydrOXYzine  (ATARAX ) 10 MG tablet Take 1-2 tablets (10-20 mg total) by mouth  at bedtime as needed. 01/16/24  Yes Sowles, Krichna, MD  levocetirizine (XYZAL ) 5 MG tablet TAKE 1 TABLET(5 MG) BY MOUTH DAILY 02/28/20  Yes Sowles, Krichna, MD  lisdexamfetamine (VYVANSE ) 40 MG capsule Take 1 capsule (40 mg total) by mouth every morning. 01/17/24  Yes Sowles, Krichna, MD  montelukast  (SINGULAIR ) 10 MG tablet Take 1 tablet (10 mg total) by mouth at bedtime. 01/16/24  Yes Sowles, Krichna, MD  QUEtiapine  (SEROQUEL ) 25 MG tablet Take 1 tablet (25 mg total) by mouth at bedtime. 10/14/23  Yes Sowles, Krichna, MD    Allergies: Allergies  Allergen  Reactions   Penicillins Anaphylaxis    Did it involve swelling of the face/tongue/throat, SOB, or low BP? Yes Did it involve sudden or severe rash/hives, skin peeling, or any reaction on the inside of your mouth or nose? No Did you need to seek medical attention at a hospital or doctor's office? Yes When did it last happen? A long time ago If all above answers are "NO", may proceed with cephalosporin use.   Bee Venom     Bee    Social History:  reports that she has been smoking cigarettes. She started smoking about 39 years ago. She has a 7 pack-year smoking history. She has never used smokeless tobacco. She reports current alcohol use of about 3.0 standard drinks of alcohol per week. She reports that she does not use drugs.   Family History: Family History  Problem Relation Age of Onset   Anemia Mother    Osteoporosis Mother    Mental illness Father        Bipolar   Depression Father    Arthritis Brother    Breast cancer Maternal Aunt    ADD / ADHD Son    ADD / ADHD Daughter    ADD / ADHD Daughter    ADD / ADHD Son     Review of Systems: Review of Systems  Constitutional:  Negative for chills and fever.  Respiratory:  Negative for shortness of breath.   Cardiovascular:  Negative for chest pain.  Gastrointestinal:  Positive for abdominal pain. Negative for nausea and vomiting.    Physical Exam BP 129/80   Pulse 84   Temp 97.9 F (36.6 C) (Oral)   Ht 5' 9 (1.753 m)   Wt 195 lb 3.2 oz (88.5 kg)   LMP 05/19/2015   SpO2 95%   BMI 28.83 kg/m  CONSTITUTIONAL: No acute distress HEENT:  Normocephalic, atraumatic, extraocular motion intact. NECK: Trachea is midline, and there is no jugular venous distension.  RESPIRATORY:  Normal respiratory effort without pathologic use of accessory muscles. CARDIOVASCULAR: Regular rhythm and rate. GI: The abdomen is soft, non-distended, with tenderness to palpation in the epigastric area.  There's some tenderness in the periumbilical  region.  On palpation, there is no evidence of hernia recurrence.  The midline incision is otherwise well healed.  MUSCULOSKELETAL:  Normal muscle strength and tone in all four extremities.  No peripheral edema or cyanosis. SKIN: Skin turgor is normal. There are no pathologic skin lesions.  NEUROLOGIC:  Motor and sensation is grossly normal.  Cranial nerves are grossly intact. PSYCH:  Alert and oriented to person, place and time. Affect is normal.  Laboratory Analysis: Labs from 01/31/24: Na 140, K 4.1, Cl 103, CO2 27, BUN 16, Cr 0.96.  LFTs within normal.  WBC 8.6, Hgb 11.7, Hct 35.7, Plt 370  Imaging: CT abdomen/pelvis on 01/31/24: FINDINGS: Lower chest: No acute abnormality   Hepatobiliary: Scattered  hypodensities in the liver are stable and most compatible with cysts. Gallbladder unremarkable. No biliary ductal dilatation.   Pancreas: No focal abnormality or ductal dilatation.   Spleen: No focal abnormality.  Normal size.   Adrenals/Urinary Tract: No adrenal abnormality. No focal renal abnormality. No stones or hydronephrosis. Urinary bladder is unremarkable.   Stomach/Bowel: Normal appendix. Stomach, large and small bowel grossly unremarkable. No bowel obstruction or inflammatory process.   Vascular/Lymphatic: No evidence of aneurysm or adenopathy.   Reproductive: No focal abnormality   Other: No free fluid or free air.   Musculoskeletal: No acute bony abnormality.   IMPRESSION: No acute findings in the abdomen or pelvis.  Assessment and Plan: This is a 53 y.o. female with abdominal pain.  --Discussed with the patient that on exam and CT scan, there is no evidence of hernia recurrence.  She might have torn some of the scar tissue or potentially had a muscle sprain, depending on the activity level and straining, but for now at least there's no evidence of complication from this.  Recommended that she obtain an abdominal binder to help decrease the tension off the midline to  help with the discomfort and allow the area to heal again. --With regards to the epigastric pain, when looking at the CT scan images, the area of the pylorus appears thickened.  Unfortunately the CT scan was without contrast.  However, this is the location where her pain is the most intense.  Discussed with the patient that this could potentially reflect GERD or peptic ulcer.  Recommended that she start taking Prilosec BID x 30 days and then change to once daily.  Will give information about GERD diet and foods to avoid. --Follow up with me in 1 month.  I spent 30 minutes dedicated to the care of this patient on the date of this encounter to include pre-visit review of records, face-to-face time with the patient discussing diagnosis and management, and any post-visit coordination of care.   Aloysius Sheree Plant, MD [facility]

## 2024-02-13 NOTE — Telephone Encounter (Signed)
 Requesting refill on lisdexamfetamine (VYVANSE ) 40 MG capsule  please send to [email or web address]. Pt has roughly 6 pills left

## 2024-02-13 NOTE — Patient Instructions (Addendum)
 Start taking Prilosec OTC daily at home for 1 month and see if your symptoms subside. You may also want to try an abdominal binder. Try to avoid lifting heavy things for 2 weeks. Avoid acidic, spicy and caffeine food and beverages.    GERD in Adults: Diet Changes  When you have gastroesophageal reflux disease (GERD), you may need to make changes to your diet. Choosing the right foods can help with your symptoms. Think about working with an expert in healthy eating called a dietitian. They can help you make healthy food choices. What are tips for following this plan? Reading food labels Look for foods that are low in saturated fat. Foods that may help with your symptoms include: Foods with less than 5% of daily value (DV) of fat. Foods with 0 grams of trans fat. Cooking [employer] in ways that don't use a lot of fat. These ways include: Baking. Steaming. Grilling. Broiling. To add flavor, try to use herbs that are low in spice and acidity. Avoid frying your food. Meal planning  Eat small meals often rather than eating 3 large meals each day. Eat your meals slowly in a place where you feel relaxed. If told by your health care provider, avoid: Foods that cause symptoms. Keep a food diary to keep track of foods that cause symptoms. Alcohol. Drinking a lot of liquid with meals. General instructions For 2-3 hours after you eat, avoid: Bending over. Exercise. Lying down. Chew sugar-free gum after meals. What foods should I eat? Eat a healthy diet. Try to include: Foods with high amounts of fiber. These include: Fruits and vegetables. Whole grains and beans. Low-fat dairy products. Lean meats, fish, and poultry. Egg whites. Foods that cause symptoms in someone else may not cause symptoms for you. Work with your provider to find foods that are safe for you. The items listed above may not be all the foods and drinks you can have. Talk with a dietitian to learn more. The items  listed above may not be a complete list of foods and beverages you can eat and drink. Contact a dietitian for more information. What foods should I avoid? Limiting some of these foods may help with your symptoms. Each person is different. Talk with a dietitian or your provider to help you find the exact foods to avoid. Some of the foods to avoid may include: Fruits Fruits with a lot of acid in them. These may include citrus fruits, such as oranges, grapefruit, pineapple, and lemons. Vegetables Deep-fried vegetables, such as [place] fries. Vegetables, sauces, or toppings made with added fat and vegetables with acid in them. These may include tomatoes and tomato products, chili peppers, onions, garlic, and horseradish. Grains Pastries or quick breads with added fat. Meats and other proteins High-fat meats, such as fatty beef or pork, hot dogs, ribs, ham, sausage, salami, and bacon. Fried meat or protein, such as fried fish and fried chicken. Egg yolks. Fats and oils Butter. Margarine. Shortening. Ghee. Drinks Coffee and other drinks with caffeine in them. Fizzy and sugary drinks, such as soda and energy drinks. Fruit juice made with acidic fruits, such as orange or grapefruit. Tomato juice. Sweets and desserts Chocolate and cocoa. Donuts. Seasonings and condiments Mint, such as peppermint and spearmint. Condiments, herbs, or seasonings that cause symptoms. These may include curry, hot sauce, or vinegar-based salad dressings. The items listed above may not be all the foods and drinks you should avoid. Talk with a dietitian to learn more. Questions  to ask your health care provider Changes to your diet and everyday life are often the first steps taken to manage symptoms of GERD. If these changes don't help, talk with your provider about taking medicines. Where to find more information International Foundation for Gastrointestinal Disorders: aboutgerd.org This information is not intended to  replace advice given to you by your health care provider. Make sure you discuss any questions you have with your health care provider. Document Revised: 05/24/2023 Document Reviewed: 12/08/2022 Elsevier Patient Education  2024 Else[employer] Inc.   Peptic Ulcer   A peptic ulcer is a painful sore in the lining of your stomach or the first part of your small intestine. What are the causes? Common causes of this condition include: An infection. Using certain pain medicines too often or too much. Rare tumors in the stomach, small intestine, or pancreas. What increases the risk? You are more likely to get this condition if you: Smoke. Have a family history of ulcer disease. Drink alcohol. Have been hospitalized in an intensive care unit (ICU). What are the signs or symptoms? Symptoms include: Burning pain in the area between the chest and the belly button. The pain may: Not go away (be persistent). Be worse when your stomach is empty. Be worse at night. Heartburn. Feeling sick to your stomach (nauseous) and throwing up (vomiting). Bloating. If the ulcer results in bleeding, it can cause you to: Have poop (stool) that is black and looks like tar. Throw up bright red blood. Throw up material that looks like coffee grounds. How is this treated? Treatment for this condition may include: Stopping things that can cause the ulcer, such as: Smoking. Using pain medicines. Drinking alcohol or caffeine. Medicines to reduce stomach acid. Antibiotic medicines if the ulcer is caused by an infection. A procedure that is done using a small, flexible tube that has a camera at the end (upper endoscopy). This may be done if you have a bleeding ulcer. Surgery. This may be needed if: You have a lot of bleeding. The ulcer caused a hole somewhere in the digestive system. Follow these instructions at home: Do not drink alcohol if your doctor tells you not to drink. Limit how much caffeine you take  in. Do not smoke or use any products that contain nicotine  or tobacco. If you need help quitting, ask your doctor. Take over-the-counter and prescription medicines only as told by your doctor. Do not stop or change your medicines unless you talk with your doctor about it first. Do not take aspirin, ibuprofen , or other NSAIDs unless your doctor told you to do so. Keep all follow-up visits. Contact a doctor if: You do not get better in 7 days after you start treatment. You keep having an upset stomach (indigestion) or heartburn. Get help right away if: You have sudden, sharp pain in your belly (abdomen). You have belly pain that does not go away. You have bloody poop (stool) or black, tarry poop. You throw up blood. It may look like coffee grounds. You feel light-headed or feel like you may pass out (faint). You get weak. You get sweaty or feel sticky and cold to the touch (clammy). These symptoms may be an emergency. Get help right away. Call 911. Do not wait to see if the symptoms will go away. Do not drive yourself to the hospital. Summary Symptoms of a peptic ulcer include burning pain in the area between the chest and the belly button. Do not smoke or use any products that  contain nicotine  or tobacco. If you need help quitting, ask your doctor. Take medicines only as told by your doctor. Limit how much alcohol and caffeine you have. Keep all follow-up visits. This information is not intended to replace advice given to you by your health care provider. Make sure you discuss any questions you have with your health care provider. Document Revised: 02/19/2021 Document Reviewed: 02/20/2021 Elsevier Patient Education  2024 [employer].

## 2024-02-15 DIAGNOSIS — G35 Multiple sclerosis: Secondary | ICD-10-CM | POA: Diagnosis not present

## 2024-02-15 DIAGNOSIS — D352 Benign neoplasm of pituitary gland: Secondary | ICD-10-CM | POA: Diagnosis not present

## 2024-02-15 DIAGNOSIS — H524 Presbyopia: Secondary | ICD-10-CM | POA: Diagnosis not present

## 2024-02-15 DIAGNOSIS — H43813 Vitreous degeneration, bilateral: Secondary | ICD-10-CM | POA: Diagnosis not present

## 2024-03-14 ENCOUNTER — Encounter: Payer: Self-pay | Admitting: Surgery

## 2024-03-14 ENCOUNTER — Ambulatory Visit (INDEPENDENT_AMBULATORY_CARE_PROVIDER_SITE_OTHER): Admitting: Surgery

## 2024-03-14 VITALS — BP 129/83 | HR 86 | Temp 98.5°F | Ht 69.0 in | Wt 195.4 lb

## 2024-03-14 DIAGNOSIS — R1013 Epigastric pain: Secondary | ICD-10-CM | POA: Diagnosis not present

## 2024-03-14 DIAGNOSIS — R1033 Periumbilical pain: Secondary | ICD-10-CM | POA: Diagnosis not present

## 2024-03-14 NOTE — Progress Notes (Signed)
 03/14/2024  History of Present Illness: Robin Chan is a 53 y.o. female presenting for follow up of periumbilical pain and epigastric pain.  She was seen last on 02/13/24.  At that time, her exam did not reveal a hernia recurrence.  She did have epigastric pain and was started on OTC prilosec.  She has noticed that this has helped with the epigastric pain.  However, the periumbilical pain remains.  She is wearing her abdominal binder to support, and reports that when she's forgotten to wear it, she will have abdominal pain that can last a few days.  She reports that sometimes she's noticing feeling more short of breath, although denies any specific tightness with the binder.  Past Medical History: Past Medical History:  Diagnosis Date   ADHD (attention deficit hyperactivity disorder)    Allergy    Anxiety    Arthritis    Asthma    Chronic pelvic pain in female    Complex ovarian cyst    Depression    Dyspareunia    Endometriosis    Fibroid    GERD (gastroesophageal reflux disease)    Heavy period    MS (multiple sclerosis) (HCC)    Neuromuscular disorder (HCC)    Painful menstruation    Rectocele      Past Surgical History: Past Surgical History:  Procedure Laterality Date   ABDOMINAL HYSTERECTOMY  05/26/2015   BREAST BIOPSY Right 2005   benign   COLON SURGERY     HERNIA REPAIR     INCISIONAL HERNIA REPAIR N/A 10/09/2019   Procedure: HERNIA REPAIR INCISIONAL;  Surgeon: Desiderio Schanz, MD;  Location: [facility];  Service: General;  Laterality: N/A;   INSERTION OF MESH N/A 10/09/2019   Procedure: INSERTION OF MESH;  Surgeon: Desiderio Schanz, MD;  Location: [facility];  Service: General;  Laterality: N/A;   LAPAROSCOPIC ASSISTED VAGINAL HYSTERECTOMY N/A 05/26/2015   Procedure: LAPAROSCOPIC ASSISTED VAGINAL HYSTERECTOMY, with right salpingectomy;  Surgeon: Gladis DELENA Dollar, MD;  Location: [facility];  Service: Gynecology;  Laterality: N/A;   LAPAROTOMY N/A 12/14/2018   Procedure:  EXPLORATORY LAPAROTOMY;  Surgeon: Desiderio Schanz, MD;  Location: [facility];  Service: General;  Laterality: N/A;   OVARY SURGERY Left 09/30/2014   RECTOCELE REPAIR N/A 05/26/2015   Procedure: POSTERIOR REPAIR (RECTOCELE);  Surgeon: Gladis DELENA Dollar, MD;  Location: [facility];  Service: Gynecology;  Laterality: N/A;    Home Medications: Prior to Admission medications   Medication Sig Start Date End Date Taking? Authorizing Provider  Albuterol -Budesonide (AIRSUPRA ) 90-80 MCG/ACT AERO Inhale 2 puffs into the lungs 4 (four) times daily as needed. 10/14/23  Yes Sowles, Krichna, MD  cetirizine (ZYRTEC) 10 MG tablet Take 10 mg by mouth daily.   Yes [provider]  Cholecalciferol (VITAMIN D3) 50 MCG (2000 UT) TABS Take 2,000 Units by mouth daily.   Yes [provider]  DULoxetine  (CYMBALTA ) 60 MG capsule Take 1 capsule (60 mg total) by mouth daily. 10/14/23  Yes Sowles, Krichna, MD  EPINEPHrine  0.3 mg/0.3 mL IJ SOAJ injection Inject 0.3 mg into the muscle as needed for anaphylaxis.    Yes [provider]  hydrOXYzine  (ATARAX ) 10 MG tablet TAKE 1-2 TABLETS (10-20 MG TOTAL) BY MOUTH AT BEDTIME AS NEEDED. 02/13/24  Yes Sowles, Krichna, MD  levocetirizine (XYZAL ) 5 MG tablet TAKE 1 TABLET(5 MG) BY MOUTH DAILY 02/28/20  Yes Sowles, Krichna, MD  lisdexamfetamine (VYVANSE ) 40 MG capsule Take 1 capsule (40 mg total) by mouth every morning. 02/13/24  Yes Sowles, Krichna, MD  montelukast  (SINGULAIR ) 10 MG tablet Take 1 tablet (10 mg total) by mouth at bedtime. 01/16/24  Yes Sowles, Krichna, MD  QUEtiapine  (SEROQUEL ) 25 MG tablet Take 1 tablet (25 mg total) by mouth at bedtime. 10/14/23  Yes Sowles, Krichna, MD    Allergies: Allergies  Allergen Reactions   Penicillins Anaphylaxis    Did it involve swelling of the face/tongue/throat, SOB, or low BP? Yes Did it involve sudden or severe rash/hives, skin peeling, or any reaction on the inside of your mouth or nose? No Did you need to seek  medical attention at a hospital or doctor's office? Yes When did it last happen? A long time ago If all above answers are "NO", may proceed with cephalosporin use.   Bee Venom     Bee    Review of Systems: Review of Systems  Constitutional:  Negative for chills and fever.  Respiratory:  Positive for shortness of breath.   Cardiovascular:  Negative for chest pain.  Gastrointestinal:  Positive for abdominal pain. Negative for nausea and vomiting.    Physical Exam BP 129/83   Pulse 86   Temp 98.5 F (36.9 C) (Oral)   Ht 5' 9 (1.753 m)   Wt 195 lb 6.4 oz (88.6 kg)   LMP 05/19/2015   SpO2 95%   BMI 28.86 kg/m  CONSTITUTIONAL: No acute distress HEENT:  Normocephalic, atraumatic, extraocular motion intact. RESPIRATORY:  Normal respiratory effort without pathologic use of accessory muscles. CARDIOVASCULAR: Regular rhythm and rate. GI: The abdomen is soft, non-distended, with some tenderness in the periumbilical region with deep palpation while she's coughing/straining.  However, there is no evidence of hernia recurrence.  Her midline incision is otherwise well healed.  NEUROLOGIC:  Motor and sensation is grossly normal.  Cranial nerves are grossly intact. PSYCH:  Alert and oriented to person, place and time. Affect is normal.   Assessment and Plan: This is a 53 y.o. female with periumbilical pain and epigastric pain.  --The patient's epigastric pain has improved with taking Prilosec.  Recommended that she continue taking this once daily. --The patient's periumbilical pain has remained stable.  Has not worsened but also not improved yet.  She does notice a difference with wearing the binder.  Recommended that she continue wearing this, limit the amount of straining while at work.  Work note given.  If this is related to any scar tissue tear or muscle tear, should improve on its own.  Can take tylenol  or ibuprofen  for pain.  With no hernia on CT scan and no hernia noted on exam today,  no indication for any surgical intervention. --Follow up as needed.  I spent 10 minutes dedicated to the care of this patient on the date of this encounter to include pre-visit review of records, face-to-face time with the patient discussing diagnosis and management, and any post-visit coordination of care.   Aloysius Sheree Plant, MD [facility]

## 2024-03-14 NOTE — Patient Instructions (Signed)
 Abdominal Pain, Adult    Many things can cause belly (abdominal) pain. In most cases, belly pain is not a serious problem and can be watched and treated at home. But in some cases, it can be serious.  Your doctor will try to find the cause of your belly pain.  Follow these instructions at home:  Medicines  Take over-the-counter and prescription medicines only as told by your doctor.  Do not take medicines that help you poop (laxatives) unless told by your doctor.  General instructions  Watch your belly pain for any changes. Tell your doctor if the pain gets worse.  Drink enough fluid to keep your pee (urine) pale yellow.  Contact a doctor if:  Your belly pain changes or gets worse.  You have very bad cramping or bloating in your belly.  You vomit.  Your pain gets worse with meals, after eating, or with certain foods.  You have trouble pooping or have watery poop for more than 2-3 days.  You are not hungry, or you lose weight without trying.  You have signs of not getting enough fluid or water (dehydration). These may include:  Dark pee, very Chastain pee, or no pee.  Cracked lips or dry mouth.  Feeling sleepy or weak.  You have pain when you pee or poop.  Your belly pain wakes you up at night.  You have blood in your pee.  You have a fever.  Get help right away if:  You cannot stop vomiting.  Your pain is only in one part of your belly, like on the right side.  You have bloody or black poop, or poop that looks like tar.  You have trouble breathing.  You have chest pain.  These symptoms may be an emergency. Get help right away. Call 911.  Do not wait to see if the symptoms will go away.  Do not drive yourself to the hospital.  This information is not intended to replace advice given to you by your health care provider. Make sure you discuss any questions you have with your health care provider.  Document Revised: 04/28/2022 Document Reviewed: 04/28/2022  Elsevier Patient Education  2024 [employer].

## 2024-04-03 ENCOUNTER — Encounter: Payer: Self-pay | Admitting: Family Medicine

## 2024-04-03 DIAGNOSIS — F330 Major depressive disorder, recurrent, mild: Secondary | ICD-10-CM

## 2024-04-03 DIAGNOSIS — G35 Multiple sclerosis: Secondary | ICD-10-CM

## 2024-04-03 DIAGNOSIS — F902 Attention-deficit hyperactivity disorder, combined type: Secondary | ICD-10-CM

## 2024-04-03 MED ORDER — LISDEXAMFETAMINE DIMESYLATE 40 MG PO CAPS: 40.0000 mg | ORAL_CAPSULE | ORAL | 0 refills | Status: DC

## 2024-04-18 ENCOUNTER — Ambulatory Visit: Admitting: Family Medicine

## 2024-04-18 ENCOUNTER — Encounter: Payer: Self-pay | Admitting: Family Medicine

## 2024-04-18 VITALS — BP 122/74 | HR 99 | Resp 16 | Ht 69.0 in | Wt 199.4 lb

## 2024-04-18 DIAGNOSIS — E538 Deficiency of other specified B group vitamins: Secondary | ICD-10-CM

## 2024-04-18 DIAGNOSIS — G35 Multiple sclerosis: Secondary | ICD-10-CM | POA: Diagnosis not present

## 2024-04-18 DIAGNOSIS — J4520 Mild intermittent asthma, uncomplicated: Secondary | ICD-10-CM

## 2024-04-18 DIAGNOSIS — G4709 Other insomnia: Secondary | ICD-10-CM

## 2024-04-18 DIAGNOSIS — D352 Benign neoplasm of pituitary gland: Secondary | ICD-10-CM

## 2024-04-18 DIAGNOSIS — R1033 Periumbilical pain: Secondary | ICD-10-CM | POA: Diagnosis not present

## 2024-04-18 DIAGNOSIS — J3089 Other allergic rhinitis: Secondary | ICD-10-CM

## 2024-04-18 DIAGNOSIS — Z23 Encounter for immunization: Secondary | ICD-10-CM | POA: Diagnosis not present

## 2024-04-18 DIAGNOSIS — J302 Other seasonal allergic rhinitis: Secondary | ICD-10-CM

## 2024-04-18 DIAGNOSIS — R7303 Prediabetes: Secondary | ICD-10-CM | POA: Diagnosis not present

## 2024-04-18 DIAGNOSIS — F902 Attention-deficit hyperactivity disorder, combined type: Secondary | ICD-10-CM | POA: Diagnosis not present

## 2024-04-18 DIAGNOSIS — F330 Major depressive disorder, recurrent, mild: Secondary | ICD-10-CM | POA: Diagnosis not present

## 2024-04-18 DIAGNOSIS — R935 Abnormal findings on diagnostic imaging of other abdominal regions, including retroperitoneum: Secondary | ICD-10-CM

## 2024-04-18 MED ORDER — QUETIAPINE FUMARATE 25 MG PO TABS: 25.0000 mg | ORAL_TABLET | Freq: Every day | ORAL | 1 refills | Status: AC

## 2024-04-18 MED ORDER — EPINEPHRINE 0.3 MG/0.3ML IJ SOAJ: 0.3000 mg | INTRAMUSCULAR | 1 refills | Status: AC | PRN

## 2024-04-18 MED ORDER — DULOXETINE HCL 60 MG PO CPEP: 60.0000 mg | ORAL_CAPSULE | Freq: Every day | ORAL | 1 refills | Status: AC

## 2024-04-18 MED ORDER — LISDEXAMFETAMINE DIMESYLATE 40 MG PO CAPS: 40.0000 mg | ORAL_CAPSULE | ORAL | 0 refills | Status: DC

## 2024-04-18 NOTE — Progress Notes (Signed)
 Name: Robin Chan   MRN: [id number]    DOB: 30-Oct-1970   Date:04/18/2024       Progress Note  Subjective  Chief Complaint  Chief Complaint  Patient presents with   Medical Management of Chronic Issues   Discussed the use of AI scribe software for clinical note transcription with the patient, who gave verbal consent to proceed.  History of Present Illness Robin Chan is a 53 year old female with multiple sclerosis, ADHD, and recurrent major depression who presents for a routine follow-up visit.  She is currently taking Vyvanse  40 mg daily for ADHD and fatigue associated with multiple sclerosis, which she finds effective. She last picked up her prescription on September 9th and plans to refill it in early January. She has not been on any specific MS medications for a long time and has not seen a neurologist recently. 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 She experiences left-sided foot drop when extremely tired or in hot weather.  For her major recurrent depression, she is on duloxetine  60 mg daily, which she reports is effective. She also takes quetiapine  for sleep and occasionally uses hydroxyzine  for anxiety. She recently traveled to [place] for her brother's 65th birthday and enjoyed the trip.  Regarding her intermittent asthma, she uses montelukast  and has an Air Supra inhaler, although she has not filled the latter due to insurance issues. No wheezing, cough, or shortness of breath currently.  She has perennial allergic rhinitis with seasonal variation and takes Xyzal  in the morning and Zyrtec at night. She manages B12 and vitamin D  deficiencies with over-the-counter supplements.  Her A1c has been trending up, currently at 5.9, and she is making dietary changes to prevent diabetes, such as reducing Pepsi Zero intake and limiting snacks.  She has a history of bowel obstruction and hernia  repair, and currently wears an abdominal band for support due to discomfort. She describes a sensation of everything 'sinking in together' without the band. She continues to take Prilosec daily for suspected ulcer-related symptoms.  She is allergic to penicillin, pecan tree pollen, and shaving cream. She reports a throat swelling reaction to pecan pollen in the past.    Patient Active Problem List   Diagnosis Date Noted   B12 deficiency 10/05/2022   Dyslipidemia 10/05/2022   Pre-diabetes 10/05/2022   Rectocele 07/08/2015   Status post laparoscopic assisted vaginal hysterectomy (LAVH) 07/08/2015   Left lumbar radiculitis 03/28/2015   ADD (attention deficit disorder) 12/31/2014   Allergic to bees 12/31/2014   Other insomnia 12/31/2014   Constipation 12/31/2014   Depression, major, recurrent, mild (HCC) 12/31/2014   Asthma, mild intermittent 12/31/2014   Migraine with aura and without status migrainosus, not intractable 12/31/2014   Multiple sclerosis, relapsing-remitting 12/31/2014   Endometriosis determined by laparoscopy 12/31/2014   Perennial allergic rhinitis with seasonal variation 12/31/2014   Pituitary microadenoma (HCC) 12/31/2014   Vitamin D  deficiency 12/31/2014   Acquired trigger finger 12/31/2014    Past Surgical History:  Procedure Laterality Date   ABDOMINAL HYSTERECTOMY  05/26/2015   BREAST BIOPSY Right 2005   benign   COLON SURGERY     HERNIA REPAIR     INCISIONAL HERNIA REPAIR N/A 10/09/2019   Procedure: HERNIA REPAIR INCISIONAL;  Surgeon: Desiderio Schanz, MD;  Location: [facility];  Service: General;  Laterality: N/A;   INSERTION OF MESH N/A 10/09/2019   Procedure: INSERTION  OF MESH;  Surgeon: Desiderio Schanz, MD;  Location: [facility];  Service: General;  Laterality: N/A;   LAPAROSCOPIC ASSISTED VAGINAL HYSTERECTOMY N/A 05/26/2015   Procedure: LAPAROSCOPIC ASSISTED VAGINAL HYSTERECTOMY, with right salpingectomy;  Surgeon: Gladis DELENA Dollar, MD;  Location: [facility];   Service: Gynecology;  Laterality: N/A;   LAPAROTOMY N/A 12/14/2018   Procedure: EXPLORATORY LAPAROTOMY;  Surgeon: Desiderio Schanz, MD;  Location: [facility];  Service: General;  Laterality: N/A;   OVARY SURGERY Left 09/30/2014   RECTOCELE REPAIR N/A 05/26/2015   Procedure: POSTERIOR REPAIR (RECTOCELE);  Surgeon: Gladis DELENA Dollar, MD;  Location: [facility];  Service: Gynecology;  Laterality: N/A;    Family History  Problem Relation Age of Onset   Anemia Mother    Osteoporosis Mother    Mental illness Father        Bipolar   Depression Father    Arthritis Brother    Breast cancer Maternal Aunt    ADD / ADHD Son    ADD / ADHD Daughter    ADD / ADHD Daughter    ADD / ADHD Son     Social History   Tobacco Use   Smoking status: Some Days    Current packs/day: 0.50    Average packs/day: 0.4 packs/day for 18.7 years (7.1 ttl pk-yrs)    Types: Cigarettes    Start date: 68    Last attempt to quit: 1995   Smokeless tobacco: Never   Tobacco comments:    She rolls her own cigarette   Substance Use Topics   Alcohol use: Yes    Alcohol/week: 3.0 standard drinks of alcohol    Types: 3 Glasses of wine per week    Comment: occasionally, 1-2 shots every night     Current Outpatient Medications:    Albuterol -Budesonide (AIRSUPRA ) 90-80 MCG/ACT AERO, Inhale 2 puffs into the lungs 4 (four) times daily as needed., Disp: 32.1 g, Rfl: 0   cetirizine (ZYRTEC) 10 MG tablet, Take 10 mg by mouth daily., Disp: , Rfl:    Cholecalciferol (VITAMIN D3) 50 MCG (2000 UT) TABS, Take 2,000 Units by mouth daily., Disp: , Rfl:    DULoxetine  (CYMBALTA ) 60 MG capsule, Take 1 capsule (60 mg total) by mouth daily., Disp: 90 capsule, Rfl: 1   EPINEPHrine  0.3 mg/0.3 mL IJ SOAJ injection, Inject 0.3 mg into the muscle as needed for anaphylaxis. , Disp: , Rfl:    hydrOXYzine  (ATARAX ) 10 MG tablet, TAKE 1-2 TABLETS (10-20 MG TOTAL) BY MOUTH AT BEDTIME AS NEEDED., Disp: 60 tablet, Rfl: 0   levocetirizine (XYZAL ) 5  MG tablet, TAKE 1 TABLET(5 MG) BY MOUTH DAILY, Disp: 90 tablet, Rfl: 1   lisdexamfetamine (VYVANSE ) 40 MG capsule, Take 1 capsule (40 mg total) by mouth every morning., Disp: 30 capsule, Rfl: 0   montelukast  (SINGULAIR ) 10 MG tablet, Take 1 tablet (10 mg total) by mouth at bedtime., Disp: 90 tablet, Rfl: 1   QUEtiapine  (SEROQUEL ) 25 MG tablet, Take 1 tablet (25 mg total) by mouth at bedtime., Disp: 90 tablet, Rfl: 1  Allergies  Allergen Reactions   Penicillins Anaphylaxis    Did it involve swelling of the face/tongue/throat, SOB, or low BP? Yes Did it involve sudden or severe rash/hives, skin peeling, or any reaction on the inside of your mouth or nose? No Did you need to seek medical attention at a hospital or doctor's office? Yes When did it last happen? A long time ago If all above answers are "NO", may proceed with cephalosporin  use.   Bee Venom     Bee    I personally reviewed active problem list, medication list, allergies with the patient/caregiver today.   ROS  Ten systems reviewed and is negative except as mentioned in HPI    Objective Physical Exam CONSTITUTIONAL: Patient appears well-developed and well-nourished.  No distress. HEENT: Head atraumatic, normocephalic, neck supple. CARDIOVASCULAR: Normal rate, regular rhythm and normal heart sounds.  No murmur heard. No BLE edema. PULMONARY: Effort normal and breath sounds normal. No respiratory distress. ABDOMINAL: There is no tenderness or distention. MUSCULOSKELETAL: Normal gait. Without gross motor or sensory deficit. PSYCHIATRIC: Patient has a normal mood and affect. behavior is normal. Judgment and thought content normal.  Vitals:   04/18/24 1315  BP: 122/74  Pulse: 99  Resp: 16  SpO2: 99%  Weight: 199 lb 6.4 oz (90.4 kg)  Height: 5' 9 (1.753 m)    Body mass index is 29.45 kg/m.  Recent Results (from the past 2160 hours)  Lipase, blood     Status: None   Collection Time: 01/31/24  5:18 PM  Result Value  Ref Range   Lipase 25 11 - 51 U/L    Comment: Performed at [facility], [address] [place], [place] [address]  Comprehensive metabolic panel     Status: Abnormal   Collection Time: 01/31/24  5:18 PM  Result Value Ref Range   Sodium 140 135 - 145 mmol/L   Potassium 4.1 3.5 - 5.1 mmol/L   Chloride 103 98 - 111 mmol/L   CO2 27 22 - 32 mmol/L   Glucose, Bld 102 (H) 70 - 99 mg/dL    Comment: Glucose reference range applies only to samples taken after fasting for at least 8 hours.   BUN 16 6 - 20 mg/dL   Creatinine, Ser 9.03 0.44 - 1.00 mg/dL   Calcium 9.4 8.9 - 89.6 mg/dL   Total Protein 7.2 6.5 - 8.1 g/dL   Albumin 3.9 3.5 - 5.0 g/dL   AST 18 15 - 41 U/L   ALT 15 0 - 44 U/L   Alkaline Phosphatase 61 38 - 126 U/L   Total Bilirubin 1.0 0.0 - 1.2 mg/dL   GFR, Estimated >39 >39 mL/min    Comment: (NOTE) Calculated using the CKD-EPI Creatinine Equation (2021)    Anion gap 10 5 - 15    Comment: Performed at [facility], [address] [place], [place] [address]  CBC     Status: Abnormal   Collection Time: 01/31/24  5:18 PM  Result Value Ref Range   WBC 8.6 4.0 - 10.5 K/uL   RBC 3.84 (L) 3.87 - 5.11 MIL/uL   Hemoglobin 11.7 (L) 12.0 - 15.0 g/dL   HCT 64.2 (L) 63.9 - 53.9 %   MCV 93.0 80.0 - 100.0 fL   MCH 30.5 26.0 - 34.0 pg   MCHC 32.8 30.0 - 36.0 g/dL   RDW 88.0 88.4 - 84.4 %   Platelets 370 150 - 400 K/uL   nRBC 0.0 0.0 - 0.2 %    Comment: Performed at [facility], [address] [place], [place] [address]  Urinalysis, Routine w reflex microscopic -Urine, Clean Catch     Status: Abnormal   Collection Time: 01/31/24  6:43 PM  Result Value Ref Range   Color, Urine YELLOW (A) YELLOW   APPearance CLEAR (A) CLEAR   Specific Gravity, Urine 1.016 1.005 - 1.030   pH 8.0 5.0 - 8.0   Glucose,  UA NEGATIVE NEGATIVE mg/dL   Hgb urine dipstick NEGATIVE NEGATIVE   Bilirubin Urine NEGATIVE NEGATIVE   Ketones, ur NEGATIVE NEGATIVE mg/dL   Protein, ur  NEGATIVE NEGATIVE mg/dL   Nitrite NEGATIVE NEGATIVE   Leukocytes,Ua NEGATIVE NEGATIVE    Comment: Performed at [facility], [address] [place], [place] [address]     PHQ2/9:    04/18/2024    1:14 PM 01/16/2024    1:08 PM 10/14/2023    9:09 AM 07/08/2023   11:33 AM 04/07/2023    9:24 AM  Depression screen PHQ 2/9  Decreased Interest 0 0 1 1 1   Down, Depressed, Hopeless 0 0 1 1 1   PHQ - 2 Score 0 0 2 2 2   Altered sleeping 0 0 1 1 3   Tired, decreased energy 0 0 1 1 3   Change in appetite 0 0 1 1 0  Feeling bad or failure about yourself  0 0 0 1 0  Trouble concentrating 0 0 1 1 3   Moving slowly or fidgety/restless 0 0 0 0 0  Suicidal thoughts 0 0 0 1 0  PHQ-9 Score 0 0 6 8 11   Difficult doing work/chores Not difficult at all Not difficult at all Somewhat difficult Very difficult     phq 9 is negative  Fall Risk:    04/18/2024    1:11 PM 01/16/2024    1:05 PM 07/08/2023   11:27 AM 04/07/2023    9:22 AM 01/07/2023    9:33 AM  Fall Risk   Falls in the past year? 0 0 0 1 0  Number falls in past yr: 0 0 0 0 0  Injury with Fall? 0 0 0 0 0  Risk for fall due to : No Fall Risks No Fall Risks No Fall Risks No Fall Risks No Fall Risks  Follow up Falls evaluation completed Falls prevention discussed;Falls evaluation completed;Education provided Falls prevention discussed;Education provided;Falls evaluation completed Falls prevention discussed Falls prevention discussed     Assessment & Plan Multiple sclerosis with associated fatigue and left foot drop MS with fatigue and left foot drop, no active lesions on MRI. MRI in 2021 showed white matter lesions and a right cleft cyst. - Continue current management without MS-specific medication. - Consider MRI follow-up as needed.  Attention-deficit hyperactivity disorder ADHD managed with Vyvanse  40 mg daily, effective for ADHD and MS-related fatigue. - Continue Vyvanse  40 mg daily. - Ensure 90-day supply of Vyvanse .  Major  recurrent depression, mild  Depression managed with duloxetine  60 mg daily, effective. - Continue duloxetine  60 mg daily. - Refill duloxetine  prescription.  Epigastric pain and abdominal discomfort, status post bowel obstruction and incisional hernia repair Post-surgical epigastric pain, possible ulcer, and muscular component. Previous CT scan without contrast. - recently seen by surgeon and possible scarring but continues to have pain  - Refer to GI specialist for further evaluation. - Continue Prilosec once daily. - Consider physical therapy if symptoms are muscular.  Asthma, intermittent Intermittent asthma managed with montelukast  and AirSupra  as needed. - Provide coupon for AirSupra  to ensure coverage. - Continue montelukast  as prescribed.  Allergic rhinitis, perennial and seasonal Allergic rhinitis managed with Xyzal  and Zyrtec, symptoms fluctuate seasonally. - Continue Xyzal  and Zyrtec as needed.  Allergy to penicillin, pecan tree pollen, bee venom, and mothballs Allergies with previous throat swelling from pecan pollen. EpiPen  prescribed for emergencies. - Prescribe EpiPen  with a refill. - Ensure EpiPen  is available for emergency use.  Prediabetes Prediabetes with A1c increasing  from 5.8 to 5.9. Discussed dietary changes to prevent diabetes. - Continue dietary modifications to reduce risk of diabetes. - Monitor A1c levels.  Vitamin B12 deficiency B12 deficiency managed with sublingual supplements, levels trending down. - Continue sublingual B12 supplements. - Monitor B12 levels.  Vitamin D  deficiency Vitamin D  deficiency managed with D3 supplements. - Continue D3 supplements.

## 2024-05-02 ENCOUNTER — Encounter: Payer: Self-pay | Admitting: Podiatry

## 2024-05-02 ENCOUNTER — Ambulatory Visit: Admitting: Podiatry

## 2024-05-02 VITALS — Ht 69.0 in | Wt 199.4 lb

## 2024-05-02 DIAGNOSIS — L84 Corns and callosities: Secondary | ICD-10-CM

## 2024-05-02 DIAGNOSIS — M21962 Unspecified acquired deformity of left lower leg: Secondary | ICD-10-CM | POA: Diagnosis not present

## 2024-05-02 DIAGNOSIS — M21961 Unspecified acquired deformity of right lower leg: Secondary | ICD-10-CM | POA: Diagnosis not present

## 2024-05-02 DIAGNOSIS — M79672 Pain in left foot: Secondary | ICD-10-CM | POA: Diagnosis not present

## 2024-05-02 DIAGNOSIS — M79671 Pain in right foot: Secondary | ICD-10-CM

## 2024-05-02 DIAGNOSIS — M7741 Metatarsalgia, right foot: Secondary | ICD-10-CM | POA: Diagnosis not present

## 2024-05-02 DIAGNOSIS — M7742 Metatarsalgia, left foot: Secondary | ICD-10-CM

## 2024-05-02 NOTE — Progress Notes (Signed)
 Subjective:  Patient ID: Robin Chan, female    DOB: 12-03-1970,  MRN: [id number]  Chief Complaint  Patient presents with   Callouses    Rm 3 Patient is her for calluses on ball of feet and right hallux. Calluses has been present x 4 years.    Discussed the use of AI scribe software for clinical note transcription with the patient, who gave verbal consent to proceed.  History of Present Illness Robin Chan is a 53 year old female who presents with painful calluses on her feet.  She experiences very painful calluses on her feet, significantly impacting her ability to work at [employer], where she is on her feet all day. The pain is severe enough to cause her to cry by the end of her shift, particularly around 8 PM.  She recalls a previous consultation with a podiatrist about four years ago, who mentioned that her bones might be shifting with age, contributing to the pressure under the calluses.  She has tried various treatments including corn remover pads, silicone pads, and foot creams like 'Working Feet', but these have only provided temporary relief. Pedicures helped for about two days. She has insoles but has not used custom orthotics due to cost concerns. Wearing Crocs or similar cushioned shoes alleviates the pain, but she is unable to wear them at work due to dress code restrictions.  No diabetes. She has not used any creams containing urea.      Objective:    Physical Exam VASCULAR: DP and PT pulses palpable. Foot is warm and well-perfused. Capillary fill time is brisk. DERMATOLOGIC: Normal skin turgor, texture, and temperature. No open lesions, rashes, or ulcerations. NEUROLOGIC: Normal sensation to light touch and pressure. No paresthesias. ORTHOPEDIC: Smooth, pain-free range of motion of all examined joints. No ecchymosis or bruising. No gross deformity. No pain to palpation. Diffuse hyperkeratosis and callous formation, submenopausal three bilaterally. Medial right  hallux with mild hallux valgus forming and prominence of the third metatarsal head. Gastrocnemius equinus present. Tightness in calf muscles.   No images are attached to the encounter.    Results Procedure: Callus debridement Description: Salicylic acid applied to the callus to induce keratolysis.   Assessment:   1. Callus of foot   2. Acquired deformity of left foot   3. Acquired deformity of right foot   4. Metatarsalgia of both feet      Plan:  Patient was evaluated and treated and all questions answered.  Assessment and Plan Assessment & Plan Painful plantar calluses with hyperkeratosis, bilateral feet Chronic painful plantar calluses with hyperkeratosis on bilateral feet, exacerbated by prolonged standing at work. Previous attempts with over-the-counter treatments and pedicures provided only temporary relief. The calluses are due to pressure from underlying bone structures, possibly related to shifting bones with age. Custom orthotics may help by offloading pressure from the bone under the calluses. Surgery is an option if conservative measures fail, though it carries risks and unpredictable outcomes. - Shave down calluses during the visit. - Apply salicylic acid to the calluses to promote peeling. - Recommend urea cream (40-50%) for softening calluses, available as Ravitiderm in the office or on Amazon. - Advise wearing cushioned shoes like Ufos or Crocs when possible. - Suggest using a metatarsal pad sleeve for additional cushioning, available from brands like [employer] on [employer].  Gastrocnemius equinus contributing to forefoot pressure Tightness in calf muscles contributing to increased forefoot pressure, exacerbating callus formation. Stretching exercises recommended to alleviate pressure on the forefoot. Custom  orthotics may help by offloading pressure from the bone under the calluses. - Provide calf stretching exercises to reduce forefoot pressure. - Refer to orthotist  Lolita for custom orthotic fitting to offload pressure from the bone under the calluses.  Right hallux valgus (bunion) Mild hallux valgus deformity on the right foot contributing to discomfort. Discussed potential surgical intervention for realignment if conservative measures fail, though surgery carries risks and unpredictable outcomes.  Follow-up Follow-up required to assess the effectiveness of orthotics and other interventions. Monitor effectiveness of orthotics over 1-1.5 months to determine if further intervention is needed. Consider x-rays and surgical options if orthotics and conservative measures do not provide relief. - Schedule follow-up visit with orthotist Lolita for orthotic fitting. - Monitor effectiveness of orthotics over 1-1.5 months to determine if further intervention is needed. - Consider x-rays and surgical options if orthotics and conservative measures do not provide relief.      Return if symptoms worsen or fail to improve.

## 2024-05-02 NOTE — Patient Instructions (Addendum)
 Do exercises exactly as told by your health care provider and adjust them as directed. It is normal to feel mild stretching, pulling, tightness, or discomfort as you do these exercises. Stop the exercise right away if you feel sudden pain or your pain gets worse.   Stretching exercises These exercises improve the movement and flexibility of your calf muscles. These exercises may also help to relieve pain and stiffness. Standing gastroc stretch  This exercise is also called a standing calf (gastroc) stretch. Stand with your hands against a wall. Extend your left / right leg behind you, and bend your front knee slightly. Your heels should be on the floor. Keeping your heels on the floor and your back knee straight, shift your weight toward the wall. You should feel a gentle stretch in the back of your lower leg (calf). Hold this position for 10 seconds. Repeat 10 times. Complete this exercise 2 times a day. Gastroc and soleus stretch, standing This is an exercise in which you stand on a step and use your body weight to stretch your calf muscles. To do this exercise: Stand with the ball of your left / right foot on a step. The ball of your foot is on the walking surface, right under your toes. Keep your other foot firmly on the same step. Hold on to the wall, a railing, or a chair for balance. Slowly lift your other foot, allowing your body weight to press your left / right heel down over the edge of the step. You should feel a stretch in your left / right calf. Hold this position for 10 seconds. Return both feet to the step. Repeat this exercise with a slight bend in your left / right knee. Repeat 10 times. Complete this exercise 2 times a day. Strengthening exercise This exercise builds strength and endurance in your foot muscles and may help to take pressure off your heel. Endurance is the ability to use your muscles for a long time, even after they get tired. Arch lifts This exercise is  sometimes called foot intrinsics. This is an exercise in which you lift the arch part of your foot only. To do this exercise: Sit in a chair with your feet flat on the floor. Keeping your big toe and your heel on the floor, lift only your arch, which is on the inner edge of your left / right foot. Do not move your knee or scrunch your toes. This is a small movement. Hold this position for 10 seconds. Return to the starting position. Repeat 10 times. Complete this exercise 2 times a day.    VISIT SUMMARY: Today, you were seen for painful calluses on your feet, which have been significantly affecting your ability to work. We discussed your previous treatments and explored new options to help alleviate your discomfort.  YOUR PLAN: -PAINFUL PLANTAR CALLUSES WITH HYPERKERATOSIS, BILATERAL FEET: You have painful calluses on the bottoms of both feet due to pressure from underlying bone structures. We shaved down the calluses and applied salicylic acid to promote peeling. I recommend using a urea cream (40-50%) to soften the calluses, which you can find as Ravitiderm in the office or on Amazon. Please wear cushioned shoes like Ufos or Crocs when possible and consider using a metatarsal pad sleeve for additional cushioning.  -GASTROCNEMIUS EQUINUS CONTRIBUTING TO FOREFOOT PRESSURE: Tight calf muscles are adding pressure to the front of your feet, worsening the calluses. I have provided you with calf stretching exercises to help reduce this  pressure. Additionally, I am referring you to an orthotist named Lolita for custom orthotic fitting to help offload pressure from the bones under the calluses.  -RIGHT HALLUX VALGUS (BUNION): You have a mild bunion on your right foot, which is contributing to your discomfort. We discussed the possibility of surgery for realignment if conservative measures do not work, though surgery carries risks and unpredictable outcomes.  INSTRUCTIONS: Please schedule a follow-up  visit with orthotist Lolita for your custom orthotic fitting. We will monitor the effectiveness of the orthotics over the next 1-1.5 months to determine if further intervention is needed. If the orthotics and other conservative measures do not provide relief, we may consider x-rays and surgical options.                      Contains text generated by Abridge.                                 Contains text generated by Abridge.

## 2024-06-08 ENCOUNTER — Other Ambulatory Visit

## 2024-06-26 ENCOUNTER — Other Ambulatory Visit: Payer: Self-pay

## 2024-06-27 NOTE — Progress Notes (Unsigned)
 06/28/2024 Robin Chan [id number] 10-19-1970  Gastroenterology Office Note    Referring Provider: Glenard Mire, MD Primary Care Physician:  Sowles, Krichna, MD  Primary GI Provider: Jinny Carmine, MD    Chief Complaint   Chief Complaint  Patient presents with   New Patient (Initial Visit)    Periumbilical pain and abnormal CT Scan     History of Present Illness   Robin Chan is a 53 y.o. female with PMHX of SBO with exploratory laparotomy and lysis of adhesions 11/2018, MS,GERD, bowel obstruction likely due to illogical med at bathroom without washing your hands you know you need to wash, presenting today at the request of Sowles, Krichna, MD due to abdominal pain.  Patient reports that in 01/2024 she was having periumbilical and epigastric pain that has now resolved.  Patient had ex lap in 2020 and hernia repair in 2021 and for the most part she did well following this.  Patient states that in June she felt like something inside her stomach was tearing and felt like a large knot and was having moderate pain.  She was started on Prilosec and that has helped tremendously with epigastric pain.  She stopped taking Prilosec over a month ago with no further episodes of epigastric pain or any issues with reflux. She had a follow-up appointment with her surgeon Dr. Desiderio and it was recommended that she wear an abdominal band as there may have been a tear in scar tissue.  She wore the abdominal band up until the end of October and she is no longer having periumbilical pain. She states she had already made the appointment and wanted to establish care here and not cancel the appointment.  Patient denies any further GI issues, has a bowel movement usually twice a day.  Melena or hematochezia.    03/14/2024 surgical follow-up with Dr. Desiderio.  Continues with epigastric pain that has improved with Prilosec however periumbilical pain remains. With no hernia on CT scan and no hernia  noted on exam today, no indication for any surgical intervention.   01/31/2024 patient seen in the emergency room due to periumbilical abdominal pain.  CT with no abnormalities.  10/09/2019 open incisional hernia repair with mesh with Dr. Desiderio.  Denies family history of colon cancer.   Past Medical History:  Diagnosis Date   ADHD (attention deficit hyperactivity disorder)    Allergy    Anxiety    Arthritis    Asthma    Chronic pelvic pain in female    Complex ovarian cyst    Depression    Dyspareunia    Endometriosis    Fibroid    GERD (gastroesophageal reflux disease)    Heavy period    MS (multiple sclerosis)    Neuromuscular disorder (HCC)    Painful menstruation    Rectocele     Past Surgical History:  Procedure Laterality Date   ABDOMINAL HYSTERECTOMY  05/26/2015   BREAST BIOPSY Right 2005   benign   COLON SURGERY     HERNIA REPAIR     INCISIONAL HERNIA REPAIR N/A 10/09/2019   Procedure: HERNIA REPAIR INCISIONAL;  Surgeon: Desiderio Schanz, MD;  Location: [facility];  Service: General;  Laterality: N/A;   INSERTION OF MESH N/A 10/09/2019   Procedure: INSERTION OF MESH;  Surgeon: Desiderio Schanz, MD;  Location: [facility];  Service: General;  Laterality: N/A;   LAPAROSCOPIC ASSISTED VAGINAL HYSTERECTOMY N/A 05/26/2015   Procedure: LAPAROSCOPIC ASSISTED VAGINAL HYSTERECTOMY, with right salpingectomy;  Surgeon: Gladis DELENA Dollar, MD;  Location: [facility];  Service: Gynecology;  Laterality: N/A;   LAPAROTOMY N/A 12/14/2018   Procedure: EXPLORATORY LAPAROTOMY;  Surgeon: Desiderio Schanz, MD;  Location: [facility];  Service: General;  Laterality: N/A;   OVARY SURGERY Left 09/30/2014   RECTOCELE REPAIR N/A 05/26/2015   Procedure: POSTERIOR REPAIR (RECTOCELE);  Surgeon: Gladis DELENA Dollar, MD;  Location: [facility];  Service: Gynecology;  Laterality: N/A;    Current Outpatient Medications  Medication Sig Dispense Refill   Albuterol -Budesonide (AIRSUPRA ) 90-80 MCG/ACT AERO  Inhale 2 puffs into the lungs 4 (four) times daily as needed. 32.1 g 0   cetirizine (ZYRTEC) 10 MG tablet Take 10 mg by mouth daily.     Cholecalciferol (VITAMIN D3) 50 MCG (2000 UT) TABS Take 2,000 Units by mouth daily.     DULoxetine  (CYMBALTA ) 60 MG capsule Take 1 capsule (60 mg total) by mouth daily. 90 capsule 1   EPINEPHrine  0.3 mg/0.3 mL IJ SOAJ injection Inject 0.3 mg into the muscle as needed for anaphylaxis. 2 each 1   levocetirizine (XYZAL ) 5 MG tablet TAKE 1 TABLET(5 MG) BY MOUTH DAILY 90 tablet 1   lisdexamfetamine (VYVANSE ) 40 MG capsule Take 1 capsule (40 mg total) by mouth every morning. 90 capsule 0   QUEtiapine  (SEROQUEL ) 25 MG tablet Take 1 tablet (25 mg total) by mouth at bedtime. 90 tablet 1   vitamin B-12 (CYANOCOBALAMIN ) 100 MCG tablet Take 100 mcg by mouth daily.     hydrOXYzine  (ATARAX ) 10 MG tablet TAKE 1-2 TABLETS (10-20 MG TOTAL) BY MOUTH AT BEDTIME AS NEEDED. (Patient not taking: Reported on 06/28/2024) 60 tablet 0   montelukast  (SINGULAIR ) 10 MG tablet Take 1 tablet (10 mg total) by mouth at bedtime. (Patient not taking: Reported on 06/28/2024) 90 tablet 1   No current facility-administered medications for this visit.    Allergies as of 06/28/2024 - Review Complete 06/28/2024  Allergen Reaction Noted   Penicillins Anaphylaxis 11/29/2014   Bee venom  12/31/2014   Pecan extract  04/18/2024   Pecan pollen  04/18/2024    Family History  Problem Relation Age of Onset   Anemia Mother    Osteoporosis Mother    Mental illness Father        Bipolar   Depression Father    Arthritis Brother    Breast cancer Maternal Aunt    ADD / ADHD Son    ADD / ADHD Daughter    ADD / ADHD Daughter    ADD / ADHD Son     Social History   Socioeconomic History   Marital status: Significant Other    Spouse name: Not on file   Number of children: 2   Years of education: Not on file   Highest education level: Associate degree: academic program  Occupational History    Occupation: designer, industrial/product     Comment: [employer]   Tobacco Use   Smoking status: Some Days    Current packs/day: 0.50    Average packs/day: 0.4 packs/day for 18.9 years (7.2 ttl pk-yrs)    Types: Cigarettes    Start date: 3    Last attempt to quit: 1995   Smokeless tobacco: Never   Tobacco comments:    She rolls her own cigarette   Vaping Use   Vaping status: Former  Substance and Sexual Activity   Alcohol use: Yes    Alcohol/week: 3.0 standard drinks of alcohol    Types: 3 Glasses of wine per week  Comment: occasionally, 1-2 shots every night   Drug use: No   Sexual activity: Yes    Partners: Male    Birth control/protection: None    Comment: separated   Other Topics Concern   Not on file  Social History Narrative   She is from [place].    Moved to [place]  in 1998 because of husband's job transfer, that is when she stopped working -pending her green-card.   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    She had symptoms of MS in 2000 , daughter was 57 year old and just now found a job at [employer] March 2019   Separated since July 2019       Social Drivers of Health   Financial Resource Strain: Low Risk  (01/15/2024)   Overall Financial Resource Strain (CARDIA)    Difficulty of Paying Living Expenses: Not hard at all  Food Insecurity: No Food Insecurity (01/15/2024)   Hunger Vital Sign    Worried About Running Out of Food in the Last Year: Never true    Ran Out of Food in the Last Year: Never true  Transportation Needs: No Transportation Needs (01/15/2024)   PRAPARE - Administrator, Civil Service (Medical): No    Lack of Transportation (Non-Medical): No  Physical Activity: Inactive (01/15/2024)   Exercise Vital Sign    Days of Exercise per Week: 0 days    Minutes of Exercise per Session: Not on file  Stress: No Stress Concern Present (01/15/2024)   [employer] of Occupational Health - Occupational Stress Questionnaire    Feeling of Stress: Only a little   Recent Concern: Stress - Stress Concern Present (10/18/2023)   [employer] of Occupational Health - Occupational Stress Questionnaire    Feeling of Stress : Rather much  Social Connections: Socially Isolated (01/15/2024)   Social Connection and Isolation Panel    Frequency of Communication with Friends and Family: Once a week    Frequency of Social Gatherings with Friends and Family: Once a week    Attends Religious Services: Never    Database Administrator or Organizations: No    Attends Engineer, Structural: Not on file    Marital Status: Living with partner  Intimate Partner Violence: Not At Risk (10/18/2023)   Humiliation, Afraid, Rape, and Kick questionnaire    Fear of Current or Ex-Partner: No    Emotionally Abused: No    Physically Abused: No    Sexually Abused: No     RELEVANT GI HISTORY, IMAGING AND LABS: CBC    Component Value Date/Time   WBC 8.6 01/31/2024 1718   RBC 3.84 (L) 01/31/2024 1718   HGB 11.7 (L) 01/31/2024 1718   HGB 12.7 05/01/2019 1019   HCT 35.7 (L) 01/31/2024 1718   HCT 39.2 05/01/2019 1019   PLT 370 01/31/2024 1718   PLT 478 (H) 05/01/2019 1019   MCV 93.0 01/31/2024 1718   MCV 94 05/01/2019 1019   MCH 30.5 01/31/2024 1718   MCHC 32.8 01/31/2024 1718   RDW 11.9 01/31/2024 1718   RDW 12.0 05/01/2019 1019   LYMPHSABS 2,462 10/15/2022 1003   LYMPHSABS 2.3 05/01/2019 1019   MONOABS 0.4 05/20/2015 1141   EOSABS 228 10/14/2023 0933   EOSABS 0.1 05/01/2019 1019   BASOSABS 60 10/14/2023 0933   BASOSABS 0.1 05/01/2019 1019   Recent Labs    10/14/23 0933 01/31/24 1718  HGB 12.4 11.7*    CMP     Component  Value Date/Time   NA 140 01/31/2024 1718   NA 139 02/02/2019 1021   K 4.1 01/31/2024 1718   CL 103 01/31/2024 1718   CO2 27 01/31/2024 1718   GLUCOSE 102 (H) 01/31/2024 1718   BUN 16 01/31/2024 1718   BUN 12 02/02/2019 1021   CREATININE 0.96 01/31/2024 1718   CREATININE 0.91 10/14/2023 0933   CALCIUM 9.4 01/31/2024 1718    PROT 7.2 01/31/2024 1718   PROT 6.6 02/02/2019 1021   ALBUMIN 3.9 01/31/2024 1718   ALBUMIN 4.4 02/02/2019 1021   AST 18 01/31/2024 1718   ALT 15 01/31/2024 1718   ALKPHOS 61 01/31/2024 1718   BILITOT 1.0 01/31/2024 1718   BILITOT 0.6 02/02/2019 1021   GFRNONAA >60 01/31/2024 1718   GFRNONAA 68 03/05/2020 1016   GFRAA 79 03/05/2020 1016      Latest Ref Rng & Units 01/31/2024    5:18 PM 10/14/2023    9:33 AM 10/15/2022   10:03 AM  Hepatic Function  Total Protein 6.5 - 8.1 g/dL 7.2  6.8  7.6   Albumin 3.5 - 5.0 g/dL 3.9     AST 15 - 41 U/L 18  14  15    ALT 0 - 44 U/L 15  12  13    Alk Phosphatase 38 - 126 U/L 61     Total Bilirubin 0.0 - 1.2 mg/dL 1.0  0.5  0.5       Review of Systems   All systems reviewed and negative except where noted in HPI.    Physical Exam  BP 123/86 (BP Location: Right Arm, Patient Position: Sitting, Cuff Size: Large)   Pulse 91   Temp 98.1 F (36.7 C) (Oral)   Ht 5' 9 (1.753 m)   Wt 200 lb 6.4 oz (90.9 kg)   LMP 05/19/2015   BMI 29.59 kg/m  Patient's last menstrual period was 05/19/2015. General:   Alert and oriented. Pleasant and cooperative. Well-nourished and well-developed.  In no acute distress Head:  Normocephalic and atraumatic. Eyes:  Without icterus Ears:  Normal auditory acuity. Lungs:  Respirations even and unlabored.  Clear throughout to auscultation.   No wheezes, crackles, or rhonchi. No acute distress. Heart:  Regular rate and rhythm; no murmurs, clicks, rubs, or gallops. Abdomen:  Normal bowel sounds.  No bruits.  Soft, non-tender and non-distended without masses, hepatosplenomegaly or hernias noted.  No guarding or rebound tenderness.   Rectal:  Deferred. Msk:  Symmetrical without gross deformities. Normal posture. Extremities:  Without edema. Neurologic:  Alert and  oriented x4;  grossly normal neurologically. Skin:  Intact without significant lesions or rashes. Psych:  Alert and cooperative. Normal mood and  affect.   Assessment & Plan   Robin Chan is a 53 y.o. female presenting today to establish GI care due to having abdominal pain that has now resolved.  Patient was having periumbilical and epigastric pain.  She was started on Prilosec and that resolved the epigastric pain and she is no longer taking Prilosec.  Patient also saw Dr. Desiderio and it was recommended that she wear an abdominal band.  Her CT scan was also unremarkable. She is no longer having issues or concerns.  Patient advised to follow-up with Dr. Desiderio if any further similar symptoms in the future.    Patient will follow-up in the future if she has any further GI issues.  Grayce Bohr, DNP, AGNP-C [facility]

## 2024-06-28 ENCOUNTER — Encounter: Payer: Self-pay | Admitting: Family Medicine

## 2024-06-28 ENCOUNTER — Ambulatory Visit: Admitting: Family Medicine

## 2024-06-28 VITALS — BP 123/86 | HR 91 | Temp 98.1°F | Ht 69.0 in | Wt 200.4 lb

## 2024-06-28 DIAGNOSIS — R1013 Epigastric pain: Secondary | ICD-10-CM

## 2024-06-28 DIAGNOSIS — R1033 Periumbilical pain: Secondary | ICD-10-CM

## 2024-06-28 NOTE — Patient Instructions (Signed)
Please call if you have any issues.

## 2024-06-29 ENCOUNTER — Encounter: Payer: Self-pay | Admitting: Surgery

## 2024-07-24 ENCOUNTER — Ambulatory Visit
Admission: RE | Admit: 2024-07-24 | Discharge: 2024-07-24 | Disposition: A | Discharge status: Home / Self Care | Source: Ambulatory Visit | Attending: Family Medicine | Admitting: Family Medicine

## 2024-07-24 DIAGNOSIS — Z1231 Encounter for screening mammogram for malignant neoplasm of breast: Secondary | ICD-10-CM | POA: Insufficient documentation

## 2024-07-30 ENCOUNTER — Other Ambulatory Visit: Payer: Self-pay | Admitting: Medical Genetics

## 2024-07-30 ENCOUNTER — Ambulatory Visit: Admitting: Family Medicine

## 2024-07-30 ENCOUNTER — Encounter: Payer: Self-pay | Admitting: Family Medicine

## 2024-07-30 VITALS — BP 122/80 | HR 95 | Resp 16 | Ht 69.0 in | Wt 194.6 lb

## 2024-07-30 DIAGNOSIS — G35A Relapsing-remitting multiple sclerosis: Secondary | ICD-10-CM | POA: Diagnosis not present

## 2024-07-30 DIAGNOSIS — F902 Attention-deficit hyperactivity disorder, combined type: Secondary | ICD-10-CM | POA: Diagnosis not present

## 2024-07-30 DIAGNOSIS — F330 Major depressive disorder, recurrent, mild: Secondary | ICD-10-CM

## 2024-07-30 DIAGNOSIS — D352 Benign neoplasm of pituitary gland: Secondary | ICD-10-CM | POA: Diagnosis not present

## 2024-07-30 DIAGNOSIS — E538 Deficiency of other specified B group vitamins: Secondary | ICD-10-CM

## 2024-07-30 DIAGNOSIS — G4709 Other insomnia: Secondary | ICD-10-CM

## 2024-07-30 DIAGNOSIS — J4520 Mild intermittent asthma, uncomplicated: Secondary | ICD-10-CM | POA: Diagnosis not present

## 2024-07-30 MED ORDER — LISDEXAMFETAMINE DIMESYLATE 40 MG PO CAPS: 40.0000 mg | ORAL_CAPSULE | ORAL | 0 refills | Status: AC

## 2024-07-30 MED ORDER — AMPHETAMINE-DEXTROAMPHETAMINE 10 MG PO TABS: 10.0000 mg | ORAL_TABLET | Freq: Every day | ORAL | 0 refills | Status: AC | PRN

## 2024-07-30 NOTE — Progress Notes (Signed)
 Name: Robin Chan   MRN: [id number]    DOB: Mar 03, 1971   Date:07/30/2024       Progress Note  Subjective  Chief Complaint  Chief Complaint  Patient presents with   Medical Management of Chronic Issues   Discussed the use of AI scribe software for clinical note transcription with the patient, who gave verbal consent to proceed.  History of Present Illness Robin Chan is a 54 year old female with pituitary microadenoma and multiple sclerosis who presents for a regular follow-up visit.  She has a history of pituitary microadenoma, with an 8 mm cystic structure noted on her last MRI in 2021. She monitors her prolactin levels regularly and has no new symptoms such as double vision or headaches, although her usual migraines persist.  Regarding her multiple sclerosis, she experiences stable white matter lesions. Her foot drop typically occurs in the summer, and she reports increased fatigue, especially noticeable in the winter months. She takes Vyvanse  for ADHD and fatigue related to MS, and there was a previous shortage of this medication, but it is now available again.  She has a history of depression and anxiety, for which she takes duloxetine  60 mg daily and hydroxyzine  as needed for anxiety. For sleep, she uses quetiapine  and occasionally hydroxyzine  if needed. She reports having had a good holiday season.  Her mild intermittent asthma is managed with montelukast , which she does not take in the winter as she does not need it. She uses Xyzal  in the morning and Zyrtec at night for her perennial allergic rhinitis, and montelukast  during spring and summer when allergies flare up.  She has prediabetes with an A1c of 5.9 and is working on dietary changes, including eating earlier and managing snacks. She also inquires about a short-acting ADHD medication for occasional use on non-working days.     Patient Active Problem List   Diagnosis Date Noted   B12 deficiency 10/05/2022    Pre-diabetes 10/05/2022   Rectocele 07/08/2015   Left lumbar radiculitis 03/28/2015   ADD (attention deficit disorder) 12/31/2014   Allergic to bees 12/31/2014   Other insomnia 12/31/2014   Depression, major, recurrent, mild (HCC) 12/31/2014   Asthma, mild intermittent 12/31/2014   Migraine with aura and without status migrainosus, not intractable 12/31/2014   Multiple sclerosis, relapsing-remitting 12/31/2014   Perennial allergic rhinitis with seasonal variation 12/31/2014   Pituitary microadenoma (HCC) 12/31/2014   Vitamin D  deficiency 12/31/2014   Acquired trigger finger 12/31/2014    Past Surgical History:  Procedure Laterality Date   ABDOMINAL HYSTERECTOMY  05/26/2015   BREAST BIOPSY Right 2005   benign   COLON SURGERY     HERNIA REPAIR     INCISIONAL HERNIA REPAIR N/A 10/09/2019   Procedure: HERNIA REPAIR INCISIONAL;  Surgeon: Desiderio Schanz, MD;  Location: [facility];  Service: General;  Laterality: N/A;   LAPAROSCOPIC ASSISTED VAGINAL HYSTERECTOMY N/A 05/26/2015   Procedure: LAPAROSCOPIC ASSISTED VAGINAL HYSTERECTOMY, with right salpingectomy;  Surgeon: Gladis DELENA Dollar, MD;  Location: [facility];  Service: Gynecology;  Laterality: N/A;   LAPAROTOMY N/A 12/14/2018   Procedure: EXPLORATORY LAPAROTOMY;  Surgeon: Desiderio Schanz, MD;  Location: [facility];  Service: General;  Laterality: N/A;   OVARY SURGERY Left 09/30/2014   RECTOCELE REPAIR N/A 05/26/2015   Procedure: POSTERIOR REPAIR (RECTOCELE);  Surgeon: Gladis DELENA Dollar, MD;  Location: [facility];  Service: Gynecology;  Laterality: N/A;    Family History  Problem Relation Age of Onset   Anemia Mother  Osteoporosis Mother    Mental illness Father        Bipolar   Depression Father    Arthritis Brother    Breast cancer Maternal Aunt    ADD / ADHD Son    ADD / ADHD Daughter    ADD / ADHD Daughter    ADD / ADHD Son     Social History   Tobacco Use   Smoking status: Some Days    Current packs/day: 0.50     Average packs/day: 0.4 packs/day for 19.0 years (7.3 ttl pk-yrs)    Types: Cigarettes    Start date: 30    Last attempt to quit: 1995   Smokeless tobacco: Never   Tobacco comments:    She rolls her own cigarette   Substance Use Topics   Alcohol use: Yes    Alcohol/week: 3.0 standard drinks of alcohol    Types: 3 Glasses of wine per week    Comment: occasionally, 1-2 shots every night    Current Medications[1]  Allergies[2]  I personally reviewed active problem list, medication list, allergies, family history with the patient/caregiver today.   ROS  Ten systems reviewed and is negative except as mentioned in HPI    Objective Physical Exam CONSTITUTIONAL: Patient appears well-developed and well-nourished.  No distress. HEENT: Head atraumatic, normocephalic, neck supple. CARDIOVASCULAR: Normal rate, regular rhythm and normal heart sounds.  No murmur heard. No BLE edema. PULMONARY: Effort normal and breath sounds normal. No respiratory distress. ABDOMINAL: There is no tenderness or distention. MUSCULOSKELETAL: Normal gait. Without gross motor or sensory deficit. PSYCHIATRIC: Patient has a normal mood and affect. behavior is normal. Judgment and thought content normal.  Vitals:   07/30/24 1425  BP: 122/80  Pulse: 95  Resp: 16  SpO2: 96%  Weight: 194 lb 9.6 oz (88.3 kg)  Height: 5' 9 (1.753 m)    Body mass index is 28.74 kg/m.    PHQ2/9:    07/30/2024    2:25 PM 04/18/2024    1:14 PM 01/16/2024    1:08 PM 10/14/2023    9:09 AM 07/08/2023   11:33 AM  Depression screen PHQ 2/9  Decreased Interest 0 0 0 1 1  Down, Depressed, Hopeless 0 0 0 1 1  PHQ - 2 Score 0 0 0 2 2  Altered sleeping 0 0 0 1 1  Tired, decreased energy 0 0 0 1 1  Change in appetite 0 0 0 1 1  Feeling bad or failure about yourself  0 0 0 0 1  Trouble concentrating 0 0 0 1 1  Moving slowly or fidgety/restless 0 0 0 0 0  Suicidal thoughts 0 0 0 0 1  PHQ-9 Score 0 0  0  6  8   Difficult doing  work/chores Not difficult at all Not difficult at all Not difficult at all Somewhat difficult Very difficult     Data saved with a previous flowsheet row definition    phq 9 is negative  Fall Risk:    07/30/2024    2:25 PM 04/18/2024    1:11 PM 01/16/2024    1:05 PM 07/08/2023   11:27 AM 04/07/2023    9:22 AM  Fall Risk   Falls in the past year? 0 0 0 0 1  Number falls in past yr: 0 0 0 0 0  Injury with Fall? 0 0  0  0  0   Risk for fall due to : No Fall Risks No Fall Risks  No Fall Risks No Fall Risks No Fall Risks  Follow up Falls evaluation completed Falls evaluation completed Falls prevention discussed;Falls evaluation completed;Education provided Falls prevention discussed;Education provided;Falls evaluation completed Falls prevention discussed     Data saved with a previous flowsheet row definition      Assessment & Plan Relapsing-remitting multiple sclerosis Increased fatigue noted, especially in winter. - Continue current management plan.  Pituitary microadenoma 8 mm cystic structure on MRI from 2021. No changes in symptoms. - Continue monitoring prolactin levels with next lab work.  Attention-deficit hyperactivity disorder, combined type ADHD managed with Vyvanse . Short-acting Adderall requested for occasional use when getting up late. - Prescribed Vyvanse . - Prescribed short-acting Adderall 10 mg for occasional use when getting up late.  Major depressive disorder, recurrent Managed with duloxetine  60 mg. Hydroxyzine  used as needed for anxiety and sleep. - Continue duloxetine  60 mg. - Use hydroxyzine  as needed for anxiety and sleep.  Other insomnia Managed with quetiapine  for sleep. Hydroxyzine  used as an alternative if quetiapine  is ineffective. - Continue quetiapine  for sleep. - Use hydroxyzine  as an alternative if quetiapine  is ineffective.  Mild intermittent asthma Montelukast  not used in winter. - Continue current asthma management plan.  Allergic  rhinitis, perennial with seasonal exacerbation Managed with Xyzal  and Zyrtec during allergy season. Singulair  used during spring and summer for exacerbations. - Continue Xyzal  in the morning and Zyrtec at night during allergy season. - Use Singulair  during spring and summer for exacerbations.  Prediabetes A1c previously at 5.9. Dietary changes implemented. - Continue dietary modifications to manage prediabetes.  General health maintenance Discussion about hepatitis B vaccination. Decision to wait until blood work is done to check for past exposure. - Will consider hepatitis B vaccination based on blood work results.        [1]  Current Outpatient Medications:    Albuterol -Budesonide (AIRSUPRA ) 90-80 MCG/ACT AERO, Inhale 2 puffs into the lungs 4 (four) times daily as needed., Disp: 32.1 g, Rfl: 0   cetirizine (ZYRTEC) 10 MG tablet, Take 10 mg by mouth daily., Disp: , Rfl:    Cholecalciferol (VITAMIN D3) 50 MCG (2000 UT) TABS, Take 2,000 Units by mouth daily., Disp: , Rfl:    DULoxetine  (CYMBALTA ) 60 MG capsule, Take 1 capsule (60 mg total) by mouth daily., Disp: 90 capsule, Rfl: 1   EPINEPHrine  0.3 mg/0.3 mL IJ SOAJ injection, Inject 0.3 mg into the muscle as needed for anaphylaxis., Disp: 2 each, Rfl: 1   hydrOXYzine  (ATARAX ) 10 MG tablet, TAKE 1-2 TABLETS (10-20 MG TOTAL) BY MOUTH AT BEDTIME AS NEEDED., Disp: 60 tablet, Rfl: 0   levocetirizine (XYZAL ) 5 MG tablet, TAKE 1 TABLET(5 MG) BY MOUTH DAILY, Disp: 90 tablet, Rfl: 1   lisdexamfetamine  (VYVANSE ) 40 MG capsule, Take 1 capsule (40 mg total) by mouth every morning., Disp: 90 capsule, Rfl: 0   QUEtiapine  (SEROQUEL ) 25 MG tablet, Take 1 tablet (25 mg total) by mouth at bedtime., Disp: 90 tablet, Rfl: 1   vitamin B-12 (CYANOCOBALAMIN ) 100 MCG tablet, Take 100 mcg by mouth daily., Disp: , Rfl:    montelukast  (SINGULAIR ) 10 MG tablet, Take 1 tablet (10 mg total) by mouth at bedtime. (Patient not taking: Reported on 07/30/2024), Disp: 90  tablet, Rfl: 1 [2]  Allergies Allergen Reactions   Penicillins Anaphylaxis    Did it involve swelling of the face/tongue/throat, SOB, or low BP? Yes Did it involve sudden or severe rash/hives, skin peeling, or any reaction on the inside of your mouth or nose? No Did  you need to seek medical attention at a hospital or doctor's office? Yes When did it last happen? A long time ago If all above answers are NO, may proceed with cephalosporin use.   Bee Venom     Bee   Pecan Extract    Pecan Pollen

## 2024-08-01 ENCOUNTER — Ambulatory Visit: Admitting: Family Medicine

## 2024-08-15 ENCOUNTER — Other Ambulatory Visit: Payer: Self-pay | Admitting: Family Medicine

## 2024-08-15 DIAGNOSIS — J4520 Mild intermittent asthma, uncomplicated: Secondary | ICD-10-CM

## 2024-08-15 NOTE — Telephone Encounter (Signed)
 Requested Prescriptions  Pending Prescriptions Disp Refills   montelukast  (SINGULAIR ) 10 MG tablet [Pharmacy Med Name: MONTELUKAST  SOD 10 MG TABLET] 90 tablet 0    Sig: TAKE 1 TABLET BY MOUTH EVERYDAY AT BEDTIME     Pulmonology:  Leukotriene Inhibitors Passed - 08/15/2024 11:28 AM      Passed - Valid encounter within last 12 months    Recent Outpatient Visits           2 weeks ago Multiple sclerosis, relapsing-remitting   [facility] Glenard Mire, MD   3 months ago Multiple sclerosis, relapsing-remitting   [facility] Glenard Mire, MD   7 months ago Multiple sclerosis, relapsing-remitting   [facility] Glenard Mire, MD   10 months ago Well adult exam   [facility] Glenard Mire, MD   10 months ago Multiple sclerosis, relapsing-remitting   [facility] Glenard Mire, MD

## 2024-10-19 ENCOUNTER — Encounter: Admitting: Family Medicine

## 2024-10-29 ENCOUNTER — Ambulatory Visit: Admitting: Family Medicine
